# Patient Record
Sex: Female | Born: 1950 | Race: Black or African American | Hispanic: No | Marital: Married | Smoking: Never smoker
Health system: Southern US, Community
[De-identification: ages and names within clinical notes are randomized; demographics above are authoritative.]

## PROBLEM LIST (undated history)

## (undated) DIAGNOSIS — E669 Obesity, unspecified: Secondary | ICD-10-CM

## (undated) DIAGNOSIS — M419 Scoliosis, unspecified: Secondary | ICD-10-CM

## (undated) DIAGNOSIS — T8859XA Other complications of anesthesia, initial encounter: Secondary | ICD-10-CM

## (undated) HISTORY — DX: Scoliosis, unspecified: M41.9

## (undated) HISTORY — DX: Obesity, unspecified: E66.9

## (undated) HISTORY — PX: ABDOMINAL HYSTERECTOMY: SHX81

---

## 1998-09-24 ENCOUNTER — Other Ambulatory Visit: Admission: RE | Admit: 1998-09-24 | Discharge: 1998-09-24 | Payer: Self-pay | Admitting: Gynecology

## 1999-09-27 ENCOUNTER — Other Ambulatory Visit: Admission: RE | Admit: 1999-09-27 | Discharge: 1999-09-27 | Payer: Self-pay | Admitting: Gynecology

## 1999-10-11 ENCOUNTER — Encounter: Admission: RE | Admit: 1999-10-11 | Discharge: 1999-10-11 | Payer: Self-pay | Admitting: Family Medicine

## 1999-10-11 ENCOUNTER — Encounter: Payer: Self-pay | Admitting: Family Medicine

## 2000-10-11 ENCOUNTER — Encounter: Payer: Self-pay | Admitting: Gynecology

## 2000-10-11 ENCOUNTER — Encounter: Admission: RE | Admit: 2000-10-11 | Discharge: 2000-10-11 | Payer: Self-pay | Admitting: Gynecology

## 2000-10-16 ENCOUNTER — Other Ambulatory Visit: Admission: RE | Admit: 2000-10-16 | Discharge: 2000-10-16 | Payer: Self-pay | Admitting: Gynecology

## 2000-10-23 ENCOUNTER — Encounter: Payer: Self-pay | Admitting: Gynecology

## 2000-10-23 ENCOUNTER — Ambulatory Visit (HOSPITAL_COMMUNITY): Admission: RE | Admit: 2000-10-23 | Discharge: 2000-10-23 | Payer: Self-pay | Admitting: Gynecology

## 2001-10-16 ENCOUNTER — Encounter: Payer: Self-pay | Admitting: Family Medicine

## 2001-10-16 ENCOUNTER — Encounter: Admission: RE | Admit: 2001-10-16 | Discharge: 2001-10-16 | Payer: Self-pay | Admitting: Family Medicine

## 2002-01-24 ENCOUNTER — Encounter (INDEPENDENT_AMBULATORY_CARE_PROVIDER_SITE_OTHER): Payer: Self-pay | Admitting: Specialist

## 2002-01-24 ENCOUNTER — Ambulatory Visit (HOSPITAL_COMMUNITY): Admission: RE | Admit: 2002-01-24 | Discharge: 2002-01-24 | Payer: Self-pay | Admitting: Gastroenterology

## 2002-10-17 ENCOUNTER — Encounter: Payer: Self-pay | Admitting: Family Medicine

## 2002-10-17 ENCOUNTER — Encounter: Admission: RE | Admit: 2002-10-17 | Discharge: 2002-10-17 | Payer: Self-pay | Admitting: Family Medicine

## 2002-11-03 ENCOUNTER — Other Ambulatory Visit: Admission: RE | Admit: 2002-11-03 | Discharge: 2002-11-03 | Payer: Self-pay | Admitting: Family Medicine

## 2003-10-29 ENCOUNTER — Ambulatory Visit (HOSPITAL_COMMUNITY): Admission: RE | Admit: 2003-10-29 | Discharge: 2003-10-29 | Payer: Self-pay | Admitting: Family Medicine

## 2004-10-31 ENCOUNTER — Ambulatory Visit (HOSPITAL_COMMUNITY): Admission: RE | Admit: 2004-10-31 | Discharge: 2004-10-31 | Payer: Self-pay | Admitting: Family Medicine

## 2005-11-01 ENCOUNTER — Ambulatory Visit (HOSPITAL_COMMUNITY): Admission: RE | Admit: 2005-11-01 | Discharge: 2005-11-01 | Payer: Self-pay | Admitting: Family Medicine

## 2005-11-27 ENCOUNTER — Other Ambulatory Visit: Admission: RE | Admit: 2005-11-27 | Discharge: 2005-11-27 | Payer: Self-pay | Admitting: Gynecology

## 2006-04-06 ENCOUNTER — Ambulatory Visit (HOSPITAL_COMMUNITY): Admission: RE | Admit: 2006-04-06 | Discharge: 2006-04-06 | Payer: Self-pay | Admitting: Gynecology

## 2006-04-06 ENCOUNTER — Encounter (INDEPENDENT_AMBULATORY_CARE_PROVIDER_SITE_OTHER): Payer: Self-pay | Admitting: *Deleted

## 2006-04-06 HISTORY — PX: PELVIC LAPAROSCOPY: SHX162

## 2006-11-08 ENCOUNTER — Ambulatory Visit (HOSPITAL_COMMUNITY): Admission: RE | Admit: 2006-11-08 | Discharge: 2006-11-08 | Payer: Self-pay | Admitting: Gynecology

## 2006-12-03 ENCOUNTER — Other Ambulatory Visit: Admission: RE | Admit: 2006-12-03 | Discharge: 2006-12-03 | Payer: Self-pay | Admitting: Gynecology

## 2006-12-14 ENCOUNTER — Ambulatory Visit: Payer: Self-pay | Admitting: Oncology

## 2007-01-18 ENCOUNTER — Ambulatory Visit (HOSPITAL_COMMUNITY): Admission: RE | Admit: 2007-01-18 | Discharge: 2007-01-18 | Payer: Self-pay | Admitting: Gastroenterology

## 2007-11-12 ENCOUNTER — Ambulatory Visit (HOSPITAL_COMMUNITY): Admission: RE | Admit: 2007-11-12 | Discharge: 2007-11-12 | Payer: Self-pay | Admitting: Gynecology

## 2007-12-05 ENCOUNTER — Other Ambulatory Visit: Admission: RE | Admit: 2007-12-05 | Discharge: 2007-12-05 | Payer: Self-pay | Admitting: Gynecology

## 2008-11-30 ENCOUNTER — Ambulatory Visit (HOSPITAL_COMMUNITY): Admission: RE | Admit: 2008-11-30 | Discharge: 2008-11-30 | Payer: Self-pay | Admitting: Gynecology

## 2008-12-07 ENCOUNTER — Encounter: Payer: Self-pay | Admitting: Gynecology

## 2008-12-07 ENCOUNTER — Ambulatory Visit: Payer: Self-pay | Admitting: Gynecology

## 2008-12-07 ENCOUNTER — Other Ambulatory Visit: Admission: RE | Admit: 2008-12-07 | Discharge: 2008-12-07 | Payer: Self-pay | Admitting: Gynecology

## 2009-12-03 ENCOUNTER — Ambulatory Visit (HOSPITAL_COMMUNITY): Admission: RE | Admit: 2009-12-03 | Discharge: 2009-12-03 | Payer: Self-pay | Admitting: Gynecology

## 2009-12-10 ENCOUNTER — Other Ambulatory Visit: Admission: RE | Admit: 2009-12-10 | Discharge: 2009-12-10 | Payer: Self-pay | Admitting: Gynecology

## 2009-12-10 ENCOUNTER — Ambulatory Visit: Payer: Self-pay | Admitting: Gynecology

## 2010-12-05 ENCOUNTER — Ambulatory Visit (HOSPITAL_COMMUNITY)
Admission: RE | Admit: 2010-12-05 | Discharge: 2010-12-05 | Payer: Self-pay | Source: Home / Self Care | Attending: Gynecology | Admitting: Gynecology

## 2010-12-13 ENCOUNTER — Other Ambulatory Visit: Payer: Self-pay | Admitting: Gynecology

## 2010-12-13 ENCOUNTER — Other Ambulatory Visit (HOSPITAL_COMMUNITY)
Admission: RE | Admit: 2010-12-13 | Discharge: 2010-12-13 | Disposition: A | Discharge status: Home / Self Care | Payer: BC Managed Care – PPO | Source: Ambulatory Visit | Attending: Gynecology | Admitting: Gynecology

## 2010-12-13 ENCOUNTER — Ambulatory Visit
Admission: RE | Admit: 2010-12-13 | Discharge: 2010-12-13 | Payer: Self-pay | Source: Home / Self Care | Attending: Gynecology | Admitting: Gynecology

## 2010-12-13 DIAGNOSIS — Z124 Encounter for screening for malignant neoplasm of cervix: Secondary | ICD-10-CM | POA: Insufficient documentation

## 2011-01-05 ENCOUNTER — Encounter (INDEPENDENT_AMBULATORY_CARE_PROVIDER_SITE_OTHER): Payer: BC Managed Care – PPO

## 2011-01-05 DIAGNOSIS — Z13820 Encounter for screening for osteoporosis: Secondary | ICD-10-CM

## 2011-01-12 ENCOUNTER — Encounter (INDEPENDENT_AMBULATORY_CARE_PROVIDER_SITE_OTHER): Payer: BC Managed Care – PPO

## 2011-01-12 DIAGNOSIS — E559 Vitamin D deficiency, unspecified: Secondary | ICD-10-CM

## 2011-01-12 DIAGNOSIS — N393 Stress incontinence (female) (male): Secondary | ICD-10-CM

## 2011-01-20 ENCOUNTER — Ambulatory Visit (INDEPENDENT_AMBULATORY_CARE_PROVIDER_SITE_OTHER): Payer: BC Managed Care – PPO | Admitting: Gynecology

## 2011-01-20 DIAGNOSIS — E559 Vitamin D deficiency, unspecified: Secondary | ICD-10-CM

## 2011-01-20 DIAGNOSIS — N393 Stress incontinence (female) (male): Secondary | ICD-10-CM

## 2011-03-31 NOTE — Op Note (Signed)
Lauren Gray, Lauren Gray                ACCOUNT NO.:  [id number]   MEDICAL RECORD NO.:  [id number]          PATIENT TYPE:  AMB   LOCATION:  ENDO                         FACILITY:  [facility]   PHYSICIAN:  Petra Kuba, M.D.    DATE OF BIRTH:  [birth year]   DATE OF PROCEDURE:  01/18/2007  DATE OF DISCHARGE:                               OPERATIVE REPORT   PROCEDURE:  Colonoscopy.   INDICATION:  Family history of colon cancer, due for colonic screening.  Consent was signed after risks, benefits, methods, options thoroughly  discussed in the office on multiple occasions.   MEDICINES USED:  Fentanyl 100, Versed 10.   PROCEDURE IN DETAIL:  Rectal inspection is pertinent for external  hemorrhoids.  Digital exam is negative.  Video colonoscope was inserted  and advanced around the colon to the cecum.  This did require rolling  her on her back and various abdominal pressures.  No abnormalities were  seen on insertion.  The cecum was identified by the appendiceal orifice  and the ileocecal valve.  The prep was adequate.  There was minimal  liquid stools that required washing and suctioning.  On slow withdrawal  through the colon, no abnormalities were seen, specifically no polyps,  hemorrhage or masses or diverticula.  Once back in the rectum, anal  rectal pull through and retroflexion confirmed some small hemorrhoids.  Scope was straightened and readvanced a short ways up the left side of  the colon.  Air was suctioned.  Scope was removed.  The patient  tolerated the procedure well.  There was no obvious immediate  complication.   ENDOSCOPIC DIAGNOSES:  1. Internal and external hemorrhoids.  2. Otherwise within normal limits of the cecum.   PLAN:  Recheck colon screening in 5 years.  Happy to see back p.r.n.,  otherwise, return here to Dr. Lily Peer for the customary healthcare,  screening and maintenance.           ______________________________  Petra Kuba, M.D.     MEM/MEDQ  D:   01/18/2007  T:  01/18/2007  Job:  [id number]   cc:   Gaetano Hawthorne. Lily Peer, M.D.

## 2011-03-31 NOTE — Procedures (Signed)
[facility]  Patient:    Lauren Gray, HETZ Visit Number: [id number] MRN: [id number]          Service Type: END Location: ENDO Attending Physician:  Nelda Marseille Dictated by:   Petra Kuba, M.D. Proc. Date: 01/24/02 Admit Date:  01/24/2002   CC:         Melvyn Neth D. Clovis Riley, MD   Procedure Report  PROCEDURE:  Colonoscopy with biopsy.  INDICATION:  Patient due for colonic screening with some increased left upper quadrant pain and constipation.  Consent was signed after risks, benefits, methods, and options thoroughly discussed in the past.  MEDICATIONS:  Demerol 50, Versed 5.  DESCRIPTION OF PROCEDURE:  Rectal inspection was pertinent for external hemorrhoids.  Digital exam was negative.  The video colonoscope was inserted and easily advanced around the colon to the cecum.  This did not require any abdominal pressure or any position changes.  No obvious abnormality was seen on insertion.  The scope was inserted a short ways in the terminal ileum which was pertinent for just a few tiny erosions, probably prep induced, but a few scattered biopsies were obtained just to make sure.  The scope was then slowly withdrawn.  The prep was adequate.  There was some liquid stool that required washing and suctioning, but on slow withdrawal through the colon, no abnormalities were seen.  Once back in the rectum, the scope was retroflexed, pertinent for some internal hemorrhoids.  The scope was straightened and readvanced a short ways around the left side of the colon; air was suctioned and the scope removed.  The patient tolerated the procedure well.  There was no obvious immediate complication.  ENDOSCOPIC DIAGNOSES: 1. Internal/external hemorrhoids. 2. Rare minimal terminal ileum erosions, probably prep-induced, status post    biopsy. 3. Otherwise within normal limits to the cecum and the terminal ileum.  PLAN:  Happy to see back p.r.n.  Return care to  Dr. Clovis Riley for the customary health care maintenance to include yearly rectals and guaiacs, and otherwise probable repeat screening in 5-10 years. Dictated by:   Petra Kuba, M.D. Attending Physician:  Nelda Marseille DD:  01/24/02 TD:  01/25/02 Job: [id number]

## 2011-03-31 NOTE — Op Note (Signed)
Lauren Gray, Lauren Gray                ACCOUNT NO.:  [id number]   MEDICAL RECORD NO.:  [id number]          PATIENT TYPE:  AMB   LOCATION:  SDC                           FACILITY:  [facility]   PHYSICIAN:  Juan H. Lily Peer, M.D.DATE OF BIRTH:  [birth year]   DATE OF PROCEDURE:  04/06/2006  DATE OF DISCHARGE:                                 OPERATIVE REPORT   SURGEON:  Juan H. Lily Peer, M.D.   ASSISTANT:  Rande Brunt. Eda Paschal, M.D.   INDICATIONS FOR OPERATION:  60 year old gravida 1, para 1 with a strong  family history of ovarian cancer.  Patient with recent ultrasound  demonstrating an avascular solid mass of the right ovary measuring 14 by 19  mm.  The left ovary was normal.  She had a normal CA-125.   PREOPERATIVE DIAGNOSIS:  1.  Pelvic mass.  2.  Family history of ovarian cancer.   POSTOPERATIVE DIAGNOSIS:  1.  Pelvic mass.  2.  Family history of ovarian cancer.   ANESTHESIA:  General endotracheal anesthesia.   PROCEDURE PERFORMED:  Laparoscopic bilateral salpingo-oophorectomy.   FINDINGS:  Patient with evidence of previous hysterectomy, with evidence of  previous laparoscopic tubal ligation Hulka clip was noted.  Both ovaries  appeared to be normal in appearance and no other abnormalities were noted in  the pelvis.   DESCRIPTION OF OPERATION:  After the patient was adequately counseled, she  was taken to the operating room where she underwent successful general  endotracheal anesthesia.  She had received a gram of cefoxitin for  prophylaxis.  She had PSA stockings for DVT prophylaxis.  She underwent  successful general endotracheal anesthesia.  She had a Foley catheter placed  for monitoring urinary output.  The abdomen was prepped and draped in the  usual sterile fashion.  A small subumbilical incision was made under the  umbilicus. With the utilization of the 11 mm OptiVu trocar to assist, an  entrance in the peritoneal cavity was accomplished. The laparoscope was then  inserted.  Two additional port sites were made, 5 mm in the lower abdomen.  Due to omentum and fatty tissue, a third puncture site had to be made  approximately 5 fingerbreadths above the previous right lower abdominal  trocar.  All this was done under laparoscopic guidance.  The patient then  placed into the Trendelenburg position. The right tube and ovary were placed  on traction.  The right ureter was identified.  The right infundibulopelvic  ligament was cauterized and transected.  The ovary was separated from the  peritoneum through repetitive clamping and cauterization, keeping the ureter  away, until finally the right tube and ovary were freed.  A similar  procedure was carried out on the contralateral side. In an effort to remove  both specimens laparoscopically and due to the difficulty with the omentum  and the fatty connective tissue that was making it difficult for  visualization at times, it was decided to enlarge the 5 mm port on the left  lower quadrant to an 11 mm in an effort to allow the Endopouch to be  introduced and retrieval  of both specimens. The pelvic cavity was with good  hemostasis and closure was started after the CO2 was removed.  The fascia of  both 10 mm ports were closed with running stitches of 0 Vicryl suture and  the subcutaneous tissue was reapproximated with 3-0 Vicryl suture.  The 5 mm  ports were only closed with Dermabond glue as was the skin of both 11 mm  port sites.  0.25% Marcaine was infiltrated at all incision ports for  postoperative analgesia.  The patient was extubated and transferred to the  recovery room with stable vital signs.  Blood loss was minimal.  IV fluids  consisted of 2 liters of lactated Ringer's.  Urine output 200 mL.      Juan H. Lily Peer, M.D.  Electronically Signed     JHF/MEDQ  D:  04/06/2006  T:  04/06/2006  Job:  [id number]

## 2011-03-31 NOTE — H&P (Signed)
Lauren Gray, Lauren Gray                ACCOUNT NO.:  [id number]   MEDICAL RECORD NO.:  [id number]          PATIENT TYPE:  AMB   LOCATION:  SDC                           FACILITY:  [facility]   PHYSICIAN:  Juan H. Lily Peer, M.D.DATE OF BIRTH:  September 02, 1951   DATE OF ADMISSION:  DATE OF DISCHARGE:                                HISTORY & PHYSICAL   CHIEF COMPLAINT:  Pelvic mass.   HISTORY OF PRESENT ILLNESS:  The patient is a 60 year old gravida 1, para 1  who is seen in the office for preoperative consultation on March 26, as a  result of evaluation by ultrasound for screening due to the fact that the  patient has a family history of ovarian cancer.  Her mother had endometrioid  cancer of her ovary and a sister with history of breast cancer at the age of  36.  The patient weighs 211 pounds, and because of her history, wanted to  have an ultrasound to better assess her ovaries and CA-125.  The patient has  had history of transvaginal hysterectomy in the past for benign indications.  The ultrasound done in the office on February 19 demonstrated right ovarian  mass, avascular, solid, measuring 14x19 mm.  The left ovary was normal.  She  did have a CA-125 which was normal.  We had a lengthy discussion about this  being possibly either benign cyst or some calcified components.  Since she  is 60 years of age, we offered her at least the diagnostic laparoscopy.  She  had been thinking about it and discussed it with her husband, and decided to  proceed with removal of both ovaries due to her family history.   PAST MEDICAL HISTORY:  1.  She takes Bellergal S for her vasomotor symptoms.  She takes calcium      with vitamin D.  2.  Denies any medical allergies.  3.  Denies smoking or alcohol consumption.  Drinks 2-3 cups of tea per day.   FAMILY HISTORY:  Father and grandmother with diabetes.  Mother with  hypertension.  Father with cardiovascular disease and sister with history of  breast cancer.   Mother with history of endometrioid cancer of her ovary.   PHYSICAL EXAMINATION:  GENERAL APPEARANCE:  Well-developed, well-nourished  female who weighs 223 pounds.  HEENT:  Unremarkable.  NECK:  Supple.  Trachea midline.  No carotid bruits.  No thyromegaly.  LUNGS:  Clear to auscultation without rhonchi or wheezes.  HEART:  Regular rate and rhythm.  No murmurs or gallops.  BREASTS:  Not done.  ABDOMEN:  Soft, nontender without rebound or guarding.  PELVIC:  Bartholin's, urethral and Skene's within normal limits.  VAGINAL:  Cuff intact.  Bimanual exam no palpable mass or tenderness.  RECTAL:  Unremarkable.  Hemoccult negative.   ASSESSMENT:  A 60 year old with family history in mother with history of  ovarian cancer endometrioid type.  Also, sister with breast cancer.  Patient  now with a small ovarian cyst that was noted on ultrasound in February 207.  The patient would like to proceed with bilateral salpingo-oophorectomy.  She  had normal CA-125.  The risks, benefits and pros and cons of the operation  to include infection, although, she will receive prophylaxis antibiotics;  the risk of deep vein thrombosis and subsequent pulmonary embolism and death  were discussed.  She will have PSA stocking for prophylaxis in the event of  uncontrolled hemorrhage.  The patient is fully aware that if she were to  need blood or blood products, it carries the risk of anaphylactic reactions,  hepatitis and AIDS.  Will make an attempt to remove the ovaries  laparoscopically, but in the event of technical difficulties or  complications, an open laparotomy may need to be utilized which may require  the patient to be in the hospital for additional days.  All these issues  were discussed with the patient, and all questions were answered.  The  patient's recent Pap smear in January this year was normal as were her TSH,  fasting blood sugar and lipid profile with the exception of the total  cholesterol  slightly elevated at 203.  Her bone density study done in  February of this year, the AP spinal and T score -1.6 in the osteopenic  range.  All questions were answered and will follow accordingly.  The  patient is scheduled for laparoscopic bilateral salpingo-oophorectomy  Friday, May 25, at [facility].      Higginson. Lily Peer, M.D.  Electronically Signed     JHF/MEDQ  D:  04/04/2006  T:  04/04/2006  Job:  [id number]

## 2011-04-28 ENCOUNTER — Other Ambulatory Visit (INDEPENDENT_AMBULATORY_CARE_PROVIDER_SITE_OTHER): Payer: BC Managed Care – PPO

## 2011-04-28 DIAGNOSIS — E559 Vitamin D deficiency, unspecified: Secondary | ICD-10-CM

## 2011-11-02 ENCOUNTER — Other Ambulatory Visit: Payer: Self-pay | Admitting: Gynecology

## 2011-11-02 DIAGNOSIS — Z1231 Encounter for screening mammogram for malignant neoplasm of breast: Secondary | ICD-10-CM

## 2011-12-11 ENCOUNTER — Ambulatory Visit (HOSPITAL_COMMUNITY)
Admission: RE | Admit: 2011-12-11 | Discharge: 2011-12-11 | Disposition: A | Discharge status: Home / Self Care | Payer: BC Managed Care – PPO | Source: Ambulatory Visit | Attending: Gynecology | Admitting: Gynecology

## 2011-12-11 DIAGNOSIS — Z1231 Encounter for screening mammogram for malignant neoplasm of breast: Secondary | ICD-10-CM | POA: Insufficient documentation

## 2011-12-27 ENCOUNTER — Encounter: Payer: BC Managed Care – PPO | Admitting: Gynecology

## 2012-01-01 ENCOUNTER — Ambulatory Visit (INDEPENDENT_AMBULATORY_CARE_PROVIDER_SITE_OTHER): Payer: BC Managed Care – PPO | Admitting: Gynecology

## 2012-01-01 ENCOUNTER — Other Ambulatory Visit: Payer: Self-pay

## 2012-01-01 ENCOUNTER — Telehealth: Payer: Self-pay | Admitting: *Deleted

## 2012-01-01 ENCOUNTER — Encounter: Payer: Self-pay | Admitting: Gynecology

## 2012-01-01 ENCOUNTER — Telehealth: Payer: Self-pay

## 2012-01-01 DIAGNOSIS — R635 Abnormal weight gain: Secondary | ICD-10-CM

## 2012-01-01 DIAGNOSIS — R7989 Other specified abnormal findings of blood chemistry: Secondary | ICD-10-CM

## 2012-01-01 DIAGNOSIS — Z1211 Encounter for screening for malignant neoplasm of colon: Secondary | ICD-10-CM

## 2012-01-01 DIAGNOSIS — R799 Abnormal finding of blood chemistry, unspecified: Secondary | ICD-10-CM

## 2012-01-01 DIAGNOSIS — R809 Proteinuria, unspecified: Secondary | ICD-10-CM

## 2012-01-01 DIAGNOSIS — Z1272 Encounter for screening for malignant neoplasm of vagina: Secondary | ICD-10-CM

## 2012-01-01 DIAGNOSIS — N951 Menopausal and female climacteric states: Secondary | ICD-10-CM

## 2012-01-01 DIAGNOSIS — R109 Unspecified abdominal pain: Secondary | ICD-10-CM

## 2012-01-01 DIAGNOSIS — Z01419 Encounter for gynecological examination (general) (routine) without abnormal findings: Secondary | ICD-10-CM

## 2012-01-01 DIAGNOSIS — Z0000 Encounter for general adult medical examination without abnormal findings: Secondary | ICD-10-CM

## 2012-01-01 LAB — URINALYSIS W MICROSCOPIC + REFLEX CULTURE
Bilirubin Urine: NEGATIVE
Hgb urine dipstick: NEGATIVE
Ketones, ur: NEGATIVE mg/dL
Leukocytes, UA: NEGATIVE
Specific Gravity, Urine: >1.03 — ABNORMAL HIGH (ref 1.005–1.030)
Urobilinogen, UA: 0.2 mg/dL (ref 0.0–1.0)

## 2012-01-01 LAB — CBC WITH DIFFERENTIAL/PLATELET
Basophils Absolute: 0 10*3/uL (ref 0.0–0.1)
Basophils Relative: 0 % (ref 0–1)
Eosinophils Absolute: 0.1 10*3/uL (ref 0.0–0.7)
HCT: 40 % (ref 36.0–46.0)
Hemoglobin: 12.9 g/dL (ref 12.0–15.0)
MCH: 30.6 pg (ref 26.0–34.0)
MCHC: 32.3 g/dL (ref 30.0–36.0)
MCV: 95 fL (ref 78.0–100.0)
Monocytes Absolute: 0.3 10*3/uL (ref 0.1–1.0)
Platelets: 320 10*3/uL (ref 150–400)
RBC: 4.21 MIL/uL (ref 3.87–5.11)

## 2012-01-01 LAB — COMPREHENSIVE METABOLIC PANEL
ALT: 18 U/L (ref 0–35)
AST: 19 U/L (ref 0–37)
Calcium: 9.2 mg/dL (ref 8.4–10.5)
Chloride: 107 mEq/L (ref 96–112)
Creat: 1.13 mg/dL — ABNORMAL HIGH (ref 0.50–1.10)
Glucose, Bld: 87 mg/dL (ref 70–99)
Potassium: 4.3 mEq/L (ref 3.5–5.3)

## 2012-01-01 LAB — LIPID PANEL
HDL: 65 mg/dL (ref >39)
Total CHOL/HDL Ratio: 3.5 Ratio

## 2012-01-01 NOTE — Telephone Encounter (Signed)
Dr. Glenetta Hew- I had the result note below from you to relay to patient:  Please inform patient that she needs to schedule her bone density study if she hasn't done so already. Her last study was in 2009. Also would like  to repeat her urinalysis was she comes in her bone density study because of some protein in her urine and also her urine was concentrated.  Pt scheduled DEXA but said she felt sure she had one since 2009.  I told her I would pull her hardcopy chart and double check.  Indeed she had one 01/05/11 so it is not yet time for another.  I assume you will just want to return for u/a in the next few weeks??  (I will leave her paper chart with DEXA result in the hall for you.)

## 2012-01-01 NOTE — Telephone Encounter (Signed)
Patient informed appt set at [facility] for 01/05/12 @8am  with 745 arrival. Will make Dexa appt.

## 2012-01-01 NOTE — Patient Instructions (Signed)
Exercise to Lose Weight Exercise and a healthy diet may help you lose weight. Your doctor may suggest specific exercises. EXERCISE IDEAS AND TIPS  Choose low-cost things you enjoy doing, such as walking, bicycling, or exercising to workout videos.   Take stairs instead of the elevator.   Walk during your lunch break.   Park your car further away from work or school.   Go to a gym or an exercise class.   Start with 5 to 10 minutes of exercise each day. Build up to 30 minutes of exercise 4 to 6 days a week.   Wear shoes with good support and comfortable clothes.   Stretch before and after working out.   Work out until you breathe harder and your heart beats faster.   Drink extra water when you exercise.   Do not do so much that you hurt yourself, feel dizzy, or get very short of breath.  Exercises that burn about 150 calories:  Running 1  miles in 15 minutes.   Playing volleyball for 45 to 60 minutes.   Washing and waxing a car for 45 to 60 minutes.   Playing touch football for 45 minutes.   Walking 1  miles in 35 minutes.   Pushing a stroller 1  miles in 30 minutes.   Playing basketball for 30 minutes.   Raking leaves for 30 minutes.   Bicycling 5 miles in 30 minutes.   Walking 2 miles in 30 minutes.   Dancing for 30 minutes.   Shoveling snow for 15 minutes.   Swimming laps for 20 minutes.   Walking up stairs for 15 minutes.   Bicycling 4 miles in 15 minutes.   Gardening for 30 to 45 minutes.   Jumping rope for 15 minutes.   Washing windows or floors for 45 to 60 minutes.  Document Released: 12/02/2010 Document Revised: 07/12/2011 Document Reviewed: 12/02/2010 [employer] Patient Information 2012 [employer].                                                  Cholesterol Control Diet  Cholesterol levels in your body are determined significantly by your diet. Cholesterol levels may also be related to heart disease. The following material helps to  explain this relationship and discusses what you can do to help keep your heart healthy. Not all cholesterol is bad. Low-density lipoprotein (LDL) cholesterol is the "bad" cholesterol. It may cause fatty deposits to build up inside your arteries. High-density lipoprotein (HDL) cholesterol is "good." It helps to remove the "bad" LDL cholesterol from your blood. Cholesterol is a very important risk factor for heart disease. Other risk factors are high blood pressure, smoking, stress, heredity, and weight. The heart muscle gets its supply of blood through the coronary arteries. If your LDL cholesterol is high and your HDL cholesterol is low, you are at risk for having fatty deposits build up in your coronary arteries. This leaves less room through which blood can flow. Without sufficient blood and oxygen, the heart muscle cannot function properly and you may feel chest pains (angina pectoris). When a coronary artery closes up entirely, a part of the heart muscle may die, causing a heart attack (myocardial infarction). CHECKING CHOLESTEROL When your caregiver sends your blood to a lab to be analyzed for cholesterol, a complete lipid (fat) profile may be done. With   this test, the total amount of cholesterol and levels of LDL and HDL are determined. Triglycerides are a type of fat that circulates in the blood and can also be used to determine heart disease risk. The list below describes what the numbers should be: Test: Total Cholesterol.  Less than 200 mg/dl.  Test: LDL "bad cholesterol."  Less than 100 mg/dl.   Less than 70 mg/dl if you are at very high risk of a heart attack or sudden cardiac death.  Test: HDL "good cholesterol."  Greater than 50 mg/dl for women.   Greater than 40 mg/dl for men.  Test: Triglycerides.  Less than 150 mg/dl.  CONTROLLING CHOLESTEROL WITH DIET Although exercise and lifestyle factors are important, your diet is key. That is because certain foods are known to raise  cholesterol and others to lower it. The goal is to balance foods for their effect on cholesterol and more importantly, to replace saturated and trans fat with other types of fat, such as monounsaturated fat, polyunsaturated fat, and omega-3 fatty acids. On average, a person should consume no more than 15 to 17 g of saturated fat daily. Saturated and trans fats are considered "bad" fats, and they will raise LDL cholesterol. Saturated fats are primarily found in animal products such as meats, butter, and cream. However, that does not mean you need to sacrifice all your favorite foods. Today, there are good tasting, low-fat, low-cholesterol substitutes for most of the things you like to eat. Choose low-fat or nonfat alternatives. Choose round or loin cuts of red meat, since these types of cuts are lowest in fat and cholesterol. Chicken (without the skin), fish, veal, and ground turkey breast are excellent choices. Eliminate fatty meats, such as hot dogs and salami. Even shellfish have little or no saturated fat. Have a 3 oz (85 g) portion when you eat lean meat, poultry, or fish. Trans fats are also called "partially hydrogenated oils." They are oils that have been scientifically manipulated so that they are solid at room temperature resulting in a longer shelf life and improved taste and texture of foods in which they are added. Trans fats are found in stick margarine, some tub margarines, cookies, crackers, and baked goods.  When baking and cooking, oils are an excellent substitute for butter. The monounsaturated oils are especially beneficial since it is believed they lower LDL and raise HDL. The oils you should avoid entirely are saturated tropical oils, such as coconut and palm.  Remember to eat liberally from food groups that are naturally free of saturated and trans fat, including fish, fruit, vegetables, beans, grains (barley, rice, couscous, bulgur wheat), and pasta (without cream sauces).  IDENTIFYING  FOODS THAT LOWER CHOLESTEROL  Soluble fiber may lower your cholesterol. This type of fiber is found in fruits such as apples, vegetables such as broccoli, potatoes, and carrots, legumes such as beans, peas, and lentils, and grains such as barley. Foods fortified with plant sterols (phytosterol) may also lower cholesterol. You should eat at least 2 g per day of these foods for a cholesterol lowering effect.  Read package labels to identify low-saturated fats, trans fats free, and low-fat foods at the supermarket. Select cheeses that have only 2 to 3 g saturated fat per ounce. Use a heart-healthy tub margarine that is free of trans fats or partially hydrogenated oil. When buying baked goods (cookies, crackers), avoid partially hydrogenated oils. Breads and muffins should be made from whole grains (whole-wheat or whole oat flour, instead of "flour" or "  enriched flour"). Buy non-creamy canned soups with reduced salt and no added fats.  FOOD PREPARATION TECHNIQUES  Never deep-fry. If you must fry, either stir-fry, which uses very little fat, or use non-stick cooking sprays. When possible, broil, bake, or roast meats, and steam vegetables. Instead of dressing vegetables with butter or margarine, use lemon and herbs, applesauce and cinnamon (for squash and sweet potatoes), nonfat yogurt, salsa, and low-fat dressings for salads.  LOW-SATURATED FAT / LOW-FAT FOOD SUBSTITUTES Meats / Saturated Fat (g)  Avoid: Steak, marbled (3 oz/85 g) / 11 g   Choose: Steak, lean (3 oz/85 g) / 4 g   Avoid: Hamburger (3 oz/85 g) / 7 g   Choose: Hamburger, lean (3 oz/85 g) / 5 g   Avoid: Ham (3 oz/85 g) / 6 g   Choose: Ham, lean cut (3 oz/85 g) / 2.4 g   Avoid: Chicken, with skin, dark meat (3 oz/85 g) / 4 g   Choose: Chicken, skin removed, dark meat (3 oz/85 g) / 2 g   Avoid: Chicken, with skin, light meat (3 oz/85 g) / 2.5 g   Choose: Chicken, skin removed, light meat (3 oz/85 g) / 1 g  Dairy / Saturated Fat  (g)  Avoid: Whole milk (1 cup) / 5 g   Choose: Low-fat milk, 2% (1 cup) / 3 g   Choose: Low-fat milk, 1% (1 cup) / 1.5 g   Choose: Skim milk (1 cup) / 0.3 g   Avoid: Hard cheese (1 oz/28 g) / 6 g   Choose: Skim milk cheese (1 oz/28 g) / 2 to 3 g   Avoid: Cottage cheese, 4% fat (1 cup) / 6.5 g   Choose: Low-fat cottage cheese, 1% fat (1 cup) / 1.5 g   Avoid: Ice cream (1 cup) / 9 g   Choose: Sherbet (1 cup) / 2.5 g   Choose: Nonfat frozen yogurt (1 cup) / 0.3 g   Choose: Frozen fruit bar / trace   Avoid: Whipped cream (1 tbs) / 3.5 g   Choose: Nondairy whipped topping (1 tbs) / 1 g  Condiments / Saturated Fat (g)  Avoid: Mayonnaise (1 tbs) / 2 g   Choose: Low-fat mayonnaise (1 tbs) / 1 g   Avoid: Butter (1 tbs) / 7 g   Choose: Extra light margarine (1 tbs) / 1 g   Avoid: Coconut oil (1 tbs) / 11.8 g   Choose: Olive oil (1 tbs) / 1.8 g   Choose: Corn oil (1 tbs) / 1.7 g   Choose: Safflower oil (1 tbs) / 1.2 g   Choose: Sunflower oil (1 tbs) / 1.4 g   Choose: Soybean oil (1 tbs) / 2.4 g   Choose: Canola oil (1 tbs) / 1 g  Document Released: 10/30/2005 Document Revised: 07/12/2011 Document Reviewed: 04/20/2011 [employer] Patient Information 2012 [employer].   

## 2012-01-01 NOTE — Telephone Encounter (Signed)
Patient will not need a bone density study since she had one in February 2012. We'll need for her to come to the office for a urinalysis some time later in the week first thing in the morning.

## 2012-01-01 NOTE — Telephone Encounter (Signed)
Left message on patient's work voice mail. (no machine at home number/no answer) Patient advised and DEXA  Cancelled. Order put in for u/a and I advised patient to call to schedule lab appt when she knows what day she will return for u/s.

## 2012-01-01 NOTE — Progress Notes (Signed)
Lauren Gray 06-27-1951 [id number]   History:    60 y.o.  for annual exam with continued complaints of midepigastric but more left upper quadrant discomfort which comes and goes at different times but she does states is more common after eating fried foods. Review of her record indicated that her colonoscopy was in 2008 she has a followup later this year. Mammogram normal in 2000 and and 13. Patient does monthly self breast examination. Last bone density study was done in 2009 lowest T score at the [facility] -1.1. Patient with prior history of TVH BSO currently on no hormone replacement therapy and no vasomotor symptoms complaints. Patient sister died of breast cancer the age of 40.  Past medical history,surgical history, family history and social history were all reviewed and documented in the EPIC chart.  Gynecologic History No LMP recorded. Patient has had a hysterectomy. Contraception: none Last Pap: 2012. Results were: normal Last mammogram: 2013. Results were: normal  Obstetric History OB History    Grav Para Term Preterm Abortions TAB SAB Ect Mult Living   1 1 1       1      # Outc Date GA Lbr Len/2nd Wgt Sex Del Anes PTL Lv   1 TRM     F SVD  No Yes       ROS:  Was performed and pertinent positives and negatives are included in the history.  Exam: chaperone present  BP 132/88  Ht 5' 8.88" (1.75 m)  Wt 245 lb (111.131 kg)  BMI 36.31 kg/m2  Body mass index is 36.31 kg/(m^2).  General appearance : Well developed well nourished female. No acute distress HEENT: Neck supple, trachea midline, no carotid bruits, no thyroidmegaly Lungs: Clear to auscultation, no rhonchi or wheezes, or rib retractions  Heart: Regular rate and rhythm, no murmurs or gallops Breast:Examined in sitting and supine position were symmetrical in appearance, no palpable masses or tenderness,  no skin retraction, no nipple inversion, no nipple discharge, no skin discoloration, no axillary or supraclavicular  lymphadenopathy Abdomen: no palpable masses or tenderness, no rebound or guarding Extremities: no edema or skin discoloration or tenderness  Pelvic:  Bartholin, Urethra, Skene Glands: Within normal limits             Vagina: No gross lesions or discharge  Cervix: Absent  Uterus  absent  Adnexa  Without masses or tenderness  Anus and perineum  normal   Rectovaginal  normal sphincter tone without palpated masses or tenderness             Hemoccult obtained pending at time of this dictation     Assessment/Plan:  61 y.o. female for annual exam no Pap smear done today do screening guidelines discussed. Patient with persistence for quite some time of upper abdominal discomfort especially left upper quadrant and now describe today even after fatty meals. We'll plan on scheduling upper abdominal ultrasound and have her followup with the gas urologist a week after. We'll obtain the following lab work and she is fasting: Fasting lipid profile, comprehensive metabolic panel, TSH, CBC, and urinalysis. She was encouraged to continue monthly self breast examination. Fecal occult blood testing pending at time of this dictation.    Ok Edwards MD, 10:47 AM 01/01/2012

## 2012-01-01 NOTE — Telephone Encounter (Signed)
Message copied by Libby Maw on Mon Jan 01, 2012  3:56 PM ------      Message from: Ok Edwards      Created: Mon Jan 01, 2012 10:53 AM       Preslie Depasquale, please schedule upper abdominal ultrasound on this patient with midepigastric pains. She had left the office before we were able to tell her to stop by the front desk to schedule her bone density study if you would do this as well.. Patient will be making her own appointment with her gastroenterologist a week after the ultrasound date . Thank you

## 2012-01-02 ENCOUNTER — Encounter: Payer: Self-pay | Admitting: Gynecology

## 2012-01-02 NOTE — Progress Notes (Signed)
Addended by: Venora Maples on: 01/02/2012 03:45 PM   Modules accepted: Orders

## 2012-01-04 ENCOUNTER — Other Ambulatory Visit: Payer: Self-pay | Admitting: Gynecology

## 2012-01-04 DIAGNOSIS — Z1211 Encounter for screening for malignant neoplasm of colon: Secondary | ICD-10-CM

## 2012-01-05 ENCOUNTER — Ambulatory Visit (HOSPITAL_COMMUNITY)
Admission: RE | Admit: 2012-01-05 | Discharge: 2012-01-05 | Disposition: A | Discharge status: Home / Self Care | Payer: BC Managed Care – PPO | Source: Ambulatory Visit | Attending: Gynecology | Admitting: Gynecology

## 2012-01-05 DIAGNOSIS — Z90710 Acquired absence of both cervix and uterus: Secondary | ICD-10-CM | POA: Insufficient documentation

## 2012-01-05 DIAGNOSIS — R1013 Epigastric pain: Secondary | ICD-10-CM | POA: Insufficient documentation

## 2012-01-11 ENCOUNTER — Other Ambulatory Visit: Payer: BC Managed Care – PPO

## 2012-01-11 ENCOUNTER — Telehealth: Payer: Self-pay | Admitting: *Deleted

## 2012-01-11 DIAGNOSIS — R809 Proteinuria, unspecified: Secondary | ICD-10-CM

## 2012-01-11 NOTE — Telephone Encounter (Signed)
Pt will follow up with her GI doctor.

## 2012-01-11 NOTE — Telephone Encounter (Signed)
Pt informed with the below note. 

## 2012-01-11 NOTE — Telephone Encounter (Signed)
Left message for pt to call regarding the below note. 

## 2012-01-11 NOTE — Telephone Encounter (Signed)
Message copied by Aura Camps on Thu Jan 11, 2012 11:05 AM ------      Message from: Ok Edwards      Created: Thu Jan 11, 2012 11:02 AM       Lauren Gray, please inform patient that her abdominal ultrasound was normal. But because of her midepigastric pains I had recommended she followup with her gastroenterologist. If she has not established with her gastroenterologist at given her name of Dr. Elnoria Howard or Dr. Loreta Ave. Thank you

## 2012-01-12 LAB — URINALYSIS W MICROSCOPIC + REFLEX CULTURE
Bilirubin Urine: NEGATIVE
Hgb urine dipstick: NEGATIVE
Ketones, ur: NEGATIVE mg/dL
Leukocytes, UA: NEGATIVE
Nitrite: NEGATIVE
Protein, ur: NEGATIVE mg/dL
pH: 6 (ref 5.0–8.0)

## 2012-01-17 ENCOUNTER — Telehealth: Payer: Self-pay | Admitting: *Deleted

## 2012-01-17 NOTE — Telephone Encounter (Signed)
Pt called requesting recent urine culture result. Results given to pt.

## 2012-02-28 ENCOUNTER — Encounter: Payer: Self-pay | Admitting: Gynecology

## 2012-10-28 ENCOUNTER — Other Ambulatory Visit: Payer: Self-pay | Admitting: Otolaryngology

## 2012-10-28 DIAGNOSIS — H5789 Other specified disorders of eye and adnexa: Secondary | ICD-10-CM

## 2012-10-28 DIAGNOSIS — J329 Chronic sinusitis, unspecified: Secondary | ICD-10-CM

## 2012-10-31 ENCOUNTER — Ambulatory Visit
Admission: RE | Admit: 2012-10-31 | Discharge: 2012-10-31 | Disposition: A | Discharge status: Home / Self Care | Payer: BC Managed Care – PPO | Source: Ambulatory Visit | Attending: Otolaryngology | Admitting: Otolaryngology

## 2012-10-31 DIAGNOSIS — H5789 Other specified disorders of eye and adnexa: Secondary | ICD-10-CM

## 2012-10-31 DIAGNOSIS — J329 Chronic sinusitis, unspecified: Secondary | ICD-10-CM

## 2012-11-18 ENCOUNTER — Other Ambulatory Visit: Payer: Self-pay | Admitting: Gynecology

## 2012-11-18 DIAGNOSIS — Z1231 Encounter for screening mammogram for malignant neoplasm of breast: Secondary | ICD-10-CM

## 2012-12-30 ENCOUNTER — Ambulatory Visit (HOSPITAL_COMMUNITY)
Admission: RE | Admit: 2012-12-30 | Discharge: 2012-12-30 | Disposition: A | Discharge status: Home / Self Care | Payer: BC Managed Care – PPO | Source: Ambulatory Visit | Attending: Gynecology | Admitting: Gynecology

## 2012-12-30 DIAGNOSIS — Z1231 Encounter for screening mammogram for malignant neoplasm of breast: Secondary | ICD-10-CM | POA: Insufficient documentation

## 2013-01-31 ENCOUNTER — Encounter: Payer: BC Managed Care – PPO | Admitting: Gynecology

## 2013-02-03 ENCOUNTER — Encounter: Payer: BC Managed Care – PPO | Admitting: Gynecology

## 2013-02-17 ENCOUNTER — Encounter: Payer: Self-pay | Admitting: Gynecology

## 2013-02-17 ENCOUNTER — Ambulatory Visit (INDEPENDENT_AMBULATORY_CARE_PROVIDER_SITE_OTHER): Payer: BC Managed Care – PPO | Admitting: Gynecology

## 2013-02-17 VITALS — BP 130/86 | Ht 67.0 in | Wt 246.0 lb

## 2013-02-17 DIAGNOSIS — Z803 Family history of malignant neoplasm of breast: Secondary | ICD-10-CM | POA: Insufficient documentation

## 2013-02-17 DIAGNOSIS — Z01419 Encounter for gynecological examination (general) (routine) without abnormal findings: Secondary | ICD-10-CM

## 2013-02-17 DIAGNOSIS — R635 Abnormal weight gain: Secondary | ICD-10-CM

## 2013-02-17 DIAGNOSIS — Z780 Asymptomatic menopausal state: Secondary | ICD-10-CM

## 2013-02-17 DIAGNOSIS — Z8041 Family history of malignant neoplasm of ovary: Secondary | ICD-10-CM

## 2013-02-17 DIAGNOSIS — Z833 Family history of diabetes mellitus: Secondary | ICD-10-CM

## 2013-02-17 DIAGNOSIS — Z1159 Encounter for screening for other viral diseases: Secondary | ICD-10-CM

## 2013-02-17 DIAGNOSIS — Z8639 Personal history of other endocrine, nutritional and metabolic disease: Secondary | ICD-10-CM

## 2013-02-17 LAB — CBC WITH DIFFERENTIAL/PLATELET
Basophils Absolute: 0 10*3/uL (ref 0.0–0.1)
Lymphocytes Relative: 43 % (ref 12–46)
Lymphs Abs: 1.8 10*3/uL (ref 0.7–4.0)
Neutro Abs: 2 10*3/uL (ref 1.7–7.7)
Platelets: 308 10*3/uL (ref 150–400)
RBC: 4.21 MIL/uL (ref 3.87–5.11)
RDW: 13.6 % (ref 11.5–15.5)
WBC: 4.2 10*3/uL (ref 4.0–10.5)

## 2013-02-17 LAB — COMPREHENSIVE METABOLIC PANEL
ALT: 24 U/L (ref 0–35)
BUN: 13 mg/dL (ref 6–23)
CO2: 28 mEq/L (ref 19–32)
Chloride: 104 mEq/L (ref 96–112)
Creat: 1.07 mg/dL (ref 0.50–1.10)
Glucose, Bld: 104 mg/dL — ABNORMAL HIGH (ref 70–99)

## 2013-02-17 LAB — LIPID PANEL
HDL: 59 mg/dL (ref >39)
LDL Cholesterol: 135 mg/dL — ABNORMAL HIGH (ref 0–99)
Total CHOL/HDL Ratio: 3.6 Ratio
Triglycerides: 84 mg/dL (ref <150)
VLDL: 17 mg/dL (ref 0–40)

## 2013-02-17 LAB — TSH: TSH: 1.135 u[IU]/mL (ref 0.350–4.500)

## 2013-02-17 NOTE — Patient Instructions (Addendum)
BRCA-1 and BRCA-2 BRCA-1 and BRCA-2 are 2 genes that are linked with hereditary breast and ovarian cancers. About 200,000 women are diagnosed with invasive breast cancer each year and about 23,000 with ovarian cancer (according to the American Cancer Society). Of these cancers, about 5% to 10% will be due to a mutation in one of the BRCA genes. Men can also inherit an increased risk of developing breast cancer, primarily from an alteration in the BRCA-2 gene.  Individuals with mutations in BRCA1 or BRCA2 have significantly elevated risks for breast cancer (up to 80% lifetime risk), ovarian cancer (up to 40% lifetime risk), bilateral breast cancer and other types of cancers. BRCA mutations are inherited and passed from generation to generation. One half of the time, they are passed from the father's side of the family.  The DNA in white blood cells is used to detect mutations in the BRCA genes. While the gene products (proteins) of the BRCA genes act only in breast and ovarian tissue, the genes are present in every cell of the body and blood is the most easily accessible source of that DNA. PREPARATION FOR TEST The test for BRCA mutations is done on a blood sample collected by needle from a vein in the arm. The test does not require surgical biopsy of breast or ovarian tissue.  NORMAL FINDINGS No genetic mutations. Ranges for normal findings may vary among different laboratories and hospitals. You should always check with your doctor after having lab work or other tests done to discuss the meaning of your test results and whether your values are considered within normal limits. MEANING OF TEST  Your caregiver will go over the test results with you and discuss the importance and meaning of your results, as well as treatment options and the need for additional tests if necessary. OBTAINING THE TEST RESULTS It is your responsibility to obtain your test results. Ask the lab or department performing the test  when and how you will get your results. OTHER THINGS TO KNOW Your test results may have implications for other family members. When one member of a family is tested for BRCA mutations, issues often arise about how or whether to share this information with other family members. Seek advice from a genetic counselor about communication of result with your family members.  Pre and post test consultation with a health care provider knowledgeable about genetic testing cannot be overemphasized.  There are many issues to be considered when preparing for a genetic test and upon learning the results, and a genetic counselor has the knowledge and experience to help you sort through them.  If the BRCA test is positive, the options include increased frequency of check-ups (e.g., mammography, blood tests for CA-125, or transvaginal ultrasonography); medications that could reduce risk (e.g., oral contraceptives or tamoxifen); or surgical removal of the ovaries or breasts. There are a number of variables involved and it is important to discuss your options with your doctor and genetic counselor. Research studies have reported that for every 1000 women negative for BRCA mutations, between 12 and 45 of them will develop breast cancer by age 50 and between 3 and 4 will develop ovarian cancer by age 50. The risk increases with age. The test can be ordered by a doctor, preferably by one who can also offer genetic counseling. The blood sample will be sent to a laboratory that specializes in BRCA testing. The American Society of Clinical Oncology and the National Breast Cancer Coalition encourage women seeking the   test to participate in long-term outcome studies to help gather information on the effectiveness of different check-up and treatment options. Document Released: 11/23/2004 Document Revised: 01/22/2012 Document Reviewed: 10/05/2008 [facility] Patient Information 2013 [place], [place].                                             Patient information: High cholesterol (The Basics)  What is cholesterol? - Cholesterol is a substance that is found in the blood. Everyone has some. It is needed for good health. The problem is, people sometimes have too much cholesterol. Compared with people with normal cholesterol, people with high cholesterol have a higher risk of heart attacks, strokes, and other health problems. The higher your cholesterol, the higher your risk of these problems. Cholesterol levels in your body are determined significantly by your diet. Cholesterol levels may also be related to heart disease. The following material helps to explain this relationship and discusses what you can do to help keep your heart healthy. Not all cholesterol is bad. Low-density lipoprotein (LDL) cholesterol is the "bad" cholesterol. It may cause fatty deposits to build up inside your arteries. High-density lipoprotein (HDL) cholesterol is "good." It helps to remove the "bad" LDL cholesterol from your blood. Cholesterol is a very important risk factor for heart disease. Other risk factors are high blood pressure, smoking, stress, heredity, and weight.  The heart muscle gets its supply of blood through the coronary arteries. If your LDL cholesterol is high and your HDL cholesterol is low, you are at risk for having fatty deposits build up in your coronary arteries. This leaves less room through which blood can flow. Without sufficient blood and oxygen, the heart muscle cannot function properly and you may feel chest pains (angina pectoris). When a coronary artery closes up entirely, a part of the heart muscle may die, causing a heart attack (myocardial infarction).  CHECKING CHOLESTEROL When your caregiver sends your blood to a lab to be analyzed for cholesterol, a complete lipid (fat) profile may be done. With this test, the total amount of cholesterol and levels of LDL and HDL are determined. Triglycerides are a type of fat that circulates in  the blood and can also be used to determine heart disease risk. Are there different types of cholesterol? - Yes, there are a few different types. If you get a cholesterol test, you may hear your doctor or nurse talk about: Total cholesterol  LDL cholesterol - Some people call this the "bad" cholesterol. That's because having high LDL levels raises your risk of heart attacks, strokes, and other health problems.  HDL cholesterol - Some people call this the "good" cholesterol. That's because having high HDL levels lowers your risk of heart attacks, strokes, and other health problems.  Non-HDL cholesterol - Non-HDL cholesterol is your total cholesterol minus your HDL cholesterol.  Triglycerides - Triglycerides are not cholesterol. They are a type of fat. But they often get measured when cholesterol is measured. (Having high triglycerides also seems to increase the risk of heart attacks and strokes.)   Keep in mind, though, that many people who cannot meet these goals still have a low risk of heart attacks and strokes. What should I do if my doctor tells me I have high cholesterol? - Ask your doctor what your overall risk of heart attacks and strokes is. High cholesterol, by itself, is not always  a reason to worry. Having high cholesterol is just one of many things that can increase your risk of heart attacks and strokes. Other factors that increase your risk include:  Cigarette smoking  High blood pressure  Having a parent, sister, or brother who got heart disease at a young age (Young, in this case, means younger than 12 for men and younger than 13 for women.)  Being a man (Women are at risk, too, but men have a higher risk.)  Older age  If you are at high risk of heart attacks and strokes, having high cholesterol is a problem. On the other hand, if you have are at low risk, having high cholesterol may not mean much. Should I take medicine to lower cholesterol? - Not everyone who has high cholesterol  needs medicines. Your doctor or nurse will decide if you need them based on your age, family history, and other health concerns.  You should probably take a cholesterol-lowering medicine called a statin if you: Already had a heart attack or stroke  Have known heart disease  Have diabetes  Have a condition called peripheral artery disease, which makes it painful to walk, and happens when the arteries in your legs get clogged with fatty deposits  Have an abdominal aortic aneurysm, which is a widening of the main artery in the belly  Most people with any of the conditions listed above should take a statin no matter what their cholesterol level is. If your doctor or nurse puts you on a statin, stay on it. The medicine may not make you feel any different. But it can help prevent heart attacks, strokes, and death.  Can I lower my cholesterol without medicines? - Yes, you can lower your cholesterol some by:  Avoiding red meat, butter, fried foods, cheese, and other foods that have a lot of saturated fat  Losing weight (if you are overweight)  Being more active Even if these steps do little to change your cholesterol, they can improve your health in many ways.                                                   Cholesterol Control Diet  CONTROLLING CHOLESTEROL WITH DIET Although exercise and lifestyle factors are important, your diet is key. That is because certain foods are known to raise cholesterol and others to lower it. The goal is to balance foods for their effect on cholesterol and more importantly, to replace saturated and trans fat with other types of fat, such as monounsaturated fat, polyunsaturated fat, and omega-3 fatty acids. On average, a person should consume no more than 15 to 17 g of saturated fat daily. Saturated and trans fats are considered "bad" fats, and they will raise LDL cholesterol. Saturated fats are primarily found in animal products such as meats, butter, and cream. However, that  does not mean you need to sacrifice all your favorite foods. Today, there are good tasting, low-fat, low-cholesterol substitutes for most of the things you like to eat. Choose low-fat or nonfat alternatives. Choose round or loin cuts of red meat, since these types of cuts are lowest in fat and cholesterol. Chicken (without the skin), fish, veal, and ground [place] breast are excellent choices. Eliminate fatty meats, such as hot dogs and salami. Even shellfish have little or no saturated fat. Have a 3  oz (85 g) portion when you eat lean meat, poultry, or fish. Trans fats are also called "partially hydrogenated oils." They are oils that have been scientifically manipulated so that they are solid at room temperature resulting in a longer shelf life and improved taste and texture of foods in which they are added. Trans fats are found in stick margarine, some tub margarines, cookies, crackers, and baked goods.  When baking and cooking, oils are an excellent substitute for butter. The monounsaturated oils are especially beneficial since it is believed they lower LDL and raise HDL. The oils you should avoid entirely are saturated tropical oils, such as coconut and palm.  Remember to eat liberally from food groups that are naturally free of saturated and trans fat, including fish, fruit, vegetables, beans, grains (barley, rice, couscous, bulgur wheat), and pasta (without cream sauces).  IDENTIFYING FOODS THAT LOWER CHOLESTEROL  Soluble fiber may lower your cholesterol. This type of fiber is found in fruits such as apples, vegetables such as broccoli, potatoes, and carrots, legumes such as beans, peas, and lentils, and grains such as barley. Foods fortified with plant sterols (phytosterol) may also lower cholesterol. You should eat at least 2 g per day of these foods for a cholesterol lowering effect.  Read package labels to identify low-saturated fats, trans fats free, and low-fat foods at the supermarket. Select  cheeses that have only 2 to 3 g saturated fat per ounce. Use a heart-healthy tub margarine that is free of trans fats or partially hydrogenated oil. When buying baked goods (cookies, crackers), avoid partially hydrogenated oils. Breads and muffins should be made from whole grains (whole-wheat or whole oat flour, instead of "flour" or "enriched flour"). Buy non-creamy canned soups with reduced salt and no added fats.  FOOD PREPARATION TECHNIQUES  Never deep-fry. If you must fry, either stir-fry, which uses very little fat, or use non-stick cooking sprays. When possible, broil, bake, or roast meats, and steam vegetables. Instead of dressing vegetables with butter or margarine, use lemon and herbs, applesauce and cinnamon (for squash and sweet potatoes), nonfat yogurt, salsa, and low-fat dressings for salads.  LOW-SATURATED FAT / LOW-FAT FOOD SUBSTITUTES Meats / Saturated Fat (g)  Avoid: Steak, marbled (3 oz/85 g) / 11 g   Choose: Steak, lean (3 oz/85 g) / 4 g   Avoid: Hamburger (3 oz/85 g) / 7 g   Choose: Hamburger, lean (3 oz/85 g) / 5 g   Avoid: Ham (3 oz/85 g) / 6 g   Choose: Ham, lean cut (3 oz/85 g) / 2.4 g   Avoid: Chicken, with skin, dark meat (3 oz/85 g) / 4 g   Choose: Chicken, skin removed, dark meat (3 oz/85 g) / 2 g   Avoid: Chicken, with skin, light meat (3 oz/85 g) / 2.5 g   Choose: Chicken, skin removed, light meat (3 oz/85 g) / 1 g  Dairy / Saturated Fat (g)  Avoid: Whole milk (1 cup) / 5 g   Choose: Low-fat milk, 2% (1 cup) / 3 g   Choose: Low-fat milk, 1% (1 cup) / 1.5 g   Choose: Skim milk (1 cup) / 0.3 g   Avoid: Hard cheese (1 oz/28 g) / 6 g   Choose: Skim milk cheese (1 oz/28 g) / 2 to 3 g   Avoid: Cottage cheese, 4% fat (1 cup) / 6.5 g   Choose: Low-fat cottage cheese, 1% fat (1 cup) / 1.5 g   Avoid: Ice cream (1 cup) /  9 g   Choose: Sherbet (1 cup) / 2.5 g   Choose: Nonfat frozen yogurt (1 cup) / 0.3 g   Choose: Frozen fruit bar / trace    Avoid: Whipped cream (1 tbs) / 3.5 g   Choose: Nondairy whipped topping (1 tbs) / 1 g  Condiments / Saturated Fat (g)  Avoid: Mayonnaise (1 tbs) / 2 g   Choose: Low-fat mayonnaise (1 tbs) / 1 g   Avoid: Butter (1 tbs) / 7 g   Choose: Extra light margarine (1 tbs) / 1 g   Avoid: Coconut oil (1 tbs) / 11.8 g   Choose: Olive oil (1 tbs) / 1.8 g   Choose: Corn oil (1 tbs) / 1.7 g   Choose: Safflower oil (1 tbs) / 1.2 g   Choose: Sunflower oil (1 tbs) / 1.4 g   Choose: Soybean oil (1 tbs) / 2.4 g   Choose: Canola oil (1 tbs) / 1 g  Exercise to Lose Weight Exercise and a healthy diet may help you lose weight. Your doctor may suggest specific exercises. EXERCISE IDEAS AND TIPS Choose low-cost things you enjoy doing, such as walking, bicycling, or exercising to workout videos.  Take stairs instead of the elevator.  Walk during your lunch break.  Park your car further away from work or school.  Go to a gym or an exercise class.  Start with 5 to 10 minutes of exercise each day. Build up to 30 minutes of exercise 4 to 6 days a week.  Wear shoes with good support and comfortable clothes.  Stretch before and after working out.  Work out until you breathe harder and your heart beats faster.  Drink extra water when you exercise.  Do not do so much that you hurt yourself, feel dizzy, or get very short of breath.  Exercises that burn about 150 calories: Running 1  miles in 15 minutes.  Playing volleyball for 45 to 60 minutes.  Washing and waxing a car for 45 to 60 minutes.  Playing touch football for 45 minutes.  Walking 1  miles in 35 minutes.  Pushing a stroller 1  miles in 30 minutes.  Playing basketball for 30 minutes.  Raking leaves for 30 minutes.  Bicycling 5 miles in 30 minutes.  Walking 2 miles in 30 minutes.  Dancing for 30 minutes.  Shoveling snow for 15 minutes.  Swimming laps for 20 minutes.  Walking up stairs for 15 minutes.  Bicycling 4 miles in 15 minutes.   Gardening for 30 to 45 minutes.  Jumping rope for 15 minutes.  Washing windows or floors for 45 to 60 minutes.  Document Released: 12/02/2010 Document Revised: 07/12/2011 Document Reviewed: 12/02/2010 [employer] Patient Information 2012 [place], [place].

## 2013-02-17 NOTE — Progress Notes (Addendum)
Lauren Gray 15-Oct-1951 [id number]   History:    62 y.o.  for annual gyn exam was only complaint today was weight gain. Patient with prior history of TVH/BSO. Patient on no hormone replacement therapy. Patient's last bone density study was in 2012 which was normal. Patient's last colonoscopy was negative April of 2013. Patient with strong family history of cancer as follows:  Mother with history of endometrioid cancer of the ovary Sister breast cancer age 3 Grandmother and uncle with colon cancer Father with history of left breast lump?  Father insulin-dependent diabetic  Patient's last mammogram was normal February this year. Patient frequently does her self breast examination. 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 Patient would know prior history of abnormal Pap smear.  Past medical history,surgical history, family history and social history were all reviewed and documented in the EPIC chart.  Gynecologic History No LMP recorded. Patient has had a hysterectomy. Contraception: status post hysterectomy Last Pap: 2012. Results were: normal Last mammogram: 2014. Results were: normal  Obstetric History OB History   Grav Para Term Preterm Abortions TAB SAB Ect Mult Living   1 1 1       1      # Outc Date GA Lbr Len/2nd Wgt Sex Del Anes PTL Lv   1 TRM     F SVD  No Yes       ROS: A ROS was performed and pertinent positives and negatives are included in the history.  GENERAL: No fevers or chills. HEENT: No change in vision, no earache, sore throat or sinus congestion. NECK: No pain or stiffness. CARDIOVASCULAR: No chest pain or pressure. No palpitations. PULMONARY: No shortness of breath, cough or wheeze. GASTROINTESTINAL: No abdominal pain, nausea, vomiting or diarrhea, melena or bright red blood per rectum. GENITOURINARY: No urinary frequency, urgency, hesitancy or dysuria. MUSCULOSKELETAL: No joint or muscle pain, no back pain, no recent  trauma. DERMATOLOGIC: No rash, no itching, no lesions. ENDOCRINE: No polyuria, polydipsia, no heat or cold intolerance. No recent change in weight. HEMATOLOGICAL: No anemia or easy bruising or bleeding. NEUROLOGIC: No headache, seizures, numbness, tingling or weakness. PSYCHIATRIC: No depression, no loss of interest in normal activity or change in sleep pattern.     Exam: chaperone present  BP 130/86  Ht 5\' 7"  (1.702 m)  Wt 246 lb (111.585 kg)  BMI 38.52 kg/m2  Body mass index is 38.52 kg/(m^2).  General appearance : Well developed well nourished female. No acute distress HEENT: Neck supple, trachea midline, no carotid bruits, no thyroidmegaly Lungs: Clear to auscultation, no rhonchi or wheezes, or rib retractions  Heart: Regular rate and rhythm, no murmurs or gallops Breast:Examined in sitting and supine position were symmetrical in appearance, no palpable masses or tenderness,  no skin retraction, no nipple inversion, no nipple discharge, no skin discoloration, no axillary or supraclavicular lymphadenopathy Abdomen: no palpable masses or tenderness, no rebound or guarding Extremities: no edema or skin discoloration or tenderness  Pelvic:  Bartholin, Urethra, Skene Glands: Within normal limits             Vagina: No gross lesions or discharge  Cervix: absent  Uterus Absent  Adnexa  Without masses or tenderness  Anus and perineum  normal   Rectovaginal  normal sphincter tone without palpated masses or tenderness             Hemoccult card provided     Assessment/Plan:  62 y.o. female  for annual exam who was given literature information on diet and exercise. She will need to schedule her bone density study which is overdue. We discussed importance of regular exercise as well as calcium and vitamin D intake for osteoporosis prevention. She will no longer needs Pap smears since she had a hysterectomy and has had no prior history of abnormal Pap smears according to the new guidelines.  The following labs were ordered: Fasting lipid profile, conference metabolic panel, vitamin D, CBC, urinalysis, vitamin D, TSH and hepatitis C:  New CDC guidelines is recommending patients be tested once in her lifetime for hepatitis C antibody who were born between [birth year] through 1965. This was discussed with the patient today and has agreed to be tested today.  I have offered patient to undergo BRCA one BRCA2 testing and to be referred [facility] and patient would like to rebound the information that I provided and decided a later date (she is hesitant)    Ok Edwards MD, 9:47 AM 02/17/2013

## 2013-02-18 ENCOUNTER — Other Ambulatory Visit: Payer: Self-pay | Admitting: Gynecology

## 2013-02-18 DIAGNOSIS — E7800 Pure hypercholesterolemia, unspecified: Secondary | ICD-10-CM

## 2013-02-18 DIAGNOSIS — E559 Vitamin D deficiency, unspecified: Secondary | ICD-10-CM

## 2013-02-18 LAB — URINALYSIS W MICROSCOPIC + REFLEX CULTURE
Hgb urine dipstick: NEGATIVE
Leukocytes, UA: NEGATIVE
Nitrite: NEGATIVE
Protein, ur: NEGATIVE mg/dL
Urobilinogen, UA: 0.2 mg/dL (ref 0.0–1.0)

## 2013-02-18 LAB — VITAMIN D 25 HYDROXY (VIT D DEFICIENCY, FRACTURES): Vit D, 25-Hydroxy: 19 ng/mL — ABNORMAL LOW (ref 30–89)

## 2013-02-18 LAB — HEPATITIS C ANTIBODY: HCV Ab: NEGATIVE

## 2013-02-18 MED ORDER — ERGOCALCIFEROL 1.25 MG (50000 UT) PO CAPS: 50000.0000 [IU] | ORAL_CAPSULE | ORAL | Status: DC

## 2013-04-01 ENCOUNTER — Ambulatory Visit (INDEPENDENT_AMBULATORY_CARE_PROVIDER_SITE_OTHER): Payer: BC Managed Care – PPO

## 2013-04-01 DIAGNOSIS — Z13820 Encounter for screening for osteoporosis: Secondary | ICD-10-CM

## 2013-04-01 DIAGNOSIS — Z780 Asymptomatic menopausal state: Secondary | ICD-10-CM

## 2013-11-26 ENCOUNTER — Other Ambulatory Visit: Payer: Self-pay | Admitting: Gynecology

## 2013-11-26 DIAGNOSIS — Z1231 Encounter for screening mammogram for malignant neoplasm of breast: Secondary | ICD-10-CM

## 2014-01-01 ENCOUNTER — Ambulatory Visit (HOSPITAL_COMMUNITY)
Admission: RE | Admit: 2014-01-01 | Discharge: 2014-01-01 | Disposition: A | Discharge status: Home / Self Care | Payer: BC Managed Care – PPO | Source: Ambulatory Visit | Attending: Gynecology | Admitting: Gynecology

## 2014-01-01 DIAGNOSIS — Z1231 Encounter for screening mammogram for malignant neoplasm of breast: Secondary | ICD-10-CM

## 2014-03-17 ENCOUNTER — Encounter: Payer: BC Managed Care – PPO | Admitting: Gynecology

## 2014-03-26 ENCOUNTER — Ambulatory Visit (INDEPENDENT_AMBULATORY_CARE_PROVIDER_SITE_OTHER): Payer: BC Managed Care – PPO | Admitting: Gynecology

## 2014-03-26 ENCOUNTER — Encounter: Payer: Self-pay | Admitting: Gynecology

## 2014-03-26 ENCOUNTER — Telehealth: Payer: Self-pay | Admitting: *Deleted

## 2014-03-26 VITALS — BP 132/88 | Ht 67.0 in | Wt 252.0 lb

## 2014-03-26 DIAGNOSIS — Z01419 Encounter for gynecological examination (general) (routine) without abnormal findings: Secondary | ICD-10-CM

## 2014-03-26 DIAGNOSIS — I2510 Atherosclerotic heart disease of native coronary artery without angina pectoris: Secondary | ICD-10-CM

## 2014-03-26 DIAGNOSIS — E669 Obesity, unspecified: Secondary | ICD-10-CM

## 2014-03-26 DIAGNOSIS — Z8639 Personal history of other endocrine, nutritional and metabolic disease: Secondary | ICD-10-CM

## 2014-03-26 LAB — COMPREHENSIVE METABOLIC PANEL
ALBUMIN: 4 g/dL (ref 3.5–5.2)
ALT: 18 U/L (ref 0–35)
AST: 18 U/L (ref 0–37)
Alkaline Phosphatase: 75 U/L (ref 39–117)
BILIRUBIN TOTAL: 0.6 mg/dL (ref 0.2–1.2)
BUN: 14 mg/dL (ref 6–23)
CALCIUM: 9.3 mg/dL (ref 8.4–10.5)
CHLORIDE: 101 meq/L (ref 96–112)
CO2: 28 meq/L (ref 19–32)
Creat: 1.05 mg/dL (ref 0.50–1.10)
GLUCOSE: 102 mg/dL — AB (ref 70–99)
Potassium: 3.9 mEq/L (ref 3.5–5.3)
SODIUM: 138 meq/L (ref 135–145)
TOTAL PROTEIN: 7.3 g/dL (ref 6.0–8.3)

## 2014-03-26 LAB — CBC WITH DIFFERENTIAL/PLATELET
Basophils Absolute: 0 10*3/uL (ref 0.0–0.1)
Basophils Relative: 0 % (ref 0–1)
EOS ABS: 0.1 10*3/uL (ref 0.0–0.7)
Eosinophils Relative: 3 % (ref 0–5)
HCT: 37.4 % (ref 36.0–46.0)
HEMOGLOBIN: 12.8 g/dL (ref 12.0–15.0)
LYMPHS ABS: 1.8 10*3/uL (ref 0.7–4.0)
LYMPHS PCT: 39 % (ref 12–46)
MCH: 30.7 pg (ref 26.0–34.0)
MCHC: 34.2 g/dL (ref 30.0–36.0)
MCV: 89.7 fL (ref 78.0–100.0)
MONOS PCT: 5 % (ref 3–12)
Monocytes Absolute: 0.2 10*3/uL (ref 0.1–1.0)
NEUTROS PCT: 53 % (ref 43–77)
Neutro Abs: 2.5 10*3/uL (ref 1.7–7.7)
Platelets: 349 10*3/uL (ref 150–400)
RBC: 4.17 MIL/uL (ref 3.87–5.11)
RDW: 13.8 % (ref 11.5–15.5)
WBC: 4.7 10*3/uL (ref 4.0–10.5)

## 2014-03-26 LAB — LIPID PANEL
CHOLESTEROL: 226 mg/dL — AB (ref 0–200)
HDL: 63 mg/dL (ref >39)
LDL Cholesterol: 141 mg/dL — ABNORMAL HIGH (ref 0–99)
TRIGLYCERIDES: 110 mg/dL (ref <150)
Total CHOL/HDL Ratio: 3.6 Ratio
VLDL: 22 mg/dL (ref 0–40)

## 2014-03-26 NOTE — Progress Notes (Signed)
Lauren Gray 1951/04/14 [id number]   History:    64 y.o. presented to the office today for her annual GYN exam and had no complaints.Patient with prior history of TVH/BSO. Patient on no hormone replacement therapy. Patient's last bone density study was in 2012 which was normal. Patient's last colonoscopy was negative April of 2013. Patient with strong family history of cancer as follows:   Mother with history of endometrioid cancer of the ovary  Sister breast cancer age 73  Grandmother and uncle with colon cancer  Father with history of left breast lump?  Patient with no prior history of abnormal Pap smears. Patient was screened last year for hepatitis C and was negative. Last bone density study 2014 was normal. Mammogram this year was normal. Colonoscopy 2013 normal.   Past medical history,surgical history, family history and social history were all reviewed and documented in the EPIC chart.  Gynecologic History No LMP recorded. Patient has had a hysterectomy. Contraception: status post hysterectomy Last Pap: 2012. Results were: normal Last mammogram: 2015. Results were: normal  Obstetric History OB History  Gravida Para Term Preterm AB SAB TAB Ectopic Multiple Living  1 1 1       1     # Outcome Date GA Lbr Len/2nd Weight Sex Delivery Anes PTL Lv  1 TRM     F SVD  N Y       ROS: A ROS was performed and pertinent positives and negatives are included in the history.  GENERAL: No fevers or chills. HEENT: No change in vision, no earache, sore throat or sinus congestion. NECK: No pain or stiffness. CARDIOVASCULAR: No chest pain or pressure. No palpitations. PULMONARY: No shortness of breath, cough or wheeze. GASTROINTESTINAL: No abdominal pain, nausea, vomiting or diarrhea, melena or bright red blood per rectum. GENITOURINARY: No urinary frequency, urgency, hesitancy or dysuria. MUSCULOSKELETAL: No joint or muscle pain, no back pain, no recent trauma. DERMATOLOGIC: No rash, no  itching, no lesions. ENDOCRINE: No polyuria, polydipsia, no heat or cold intolerance. No recent change in weight. HEMATOLOGICAL: No anemia or easy bruising or bleeding. NEUROLOGIC: No headache, seizures, numbness, tingling or weakness. PSYCHIATRIC: No depression, no loss of interest in normal activity or change in sleep pattern.     Exam: chaperone present  BP 132/88  Ht 5' 7"  (1.702 m)  Wt 252 lb (114.306 kg)  BMI 39.46 kg/m2  Body mass index is 39.46 kg/(m^2).  General appearance : Well developed well nourished female. No acute distress HEENT: Neck supple, trachea midline, no carotid bruits, no thyroidmegaly Lungs: Clear to auscultation, no rhonchi or wheezes, or rib retractions  Heart: Regular rate and rhythm, no murmurs or gallops Breast:Examined in sitting and supine position were symmetrical in appearance, no palpable masses or tenderness,  no skin retraction, no nipple inversion, no nipple discharge, no skin discoloration, no axillary or supraclavicular lymphadenopathy Abdomen: no palpable masses or tenderness, no rebound or guarding Extremities: no edema or skin discoloration or tenderness  Pelvic:  Bartholin, Urethra, Skene Glands: Within normal limits             Vagina: No gross lesions or discharge  Cervix: No gross lesions or discharge  Uterus absent  Adnexa absent  Anus and perineum  normal   Rectovaginal  normal sphincter tone without palpated masses or tenderness             Hemoccult cards provided     Assessment/Plan:  63 y.o. female for annual exam we discussed  importance of regular exercise and diet\ as well as calcium and vitamin D intake for osteoporosis prevention. She will no longer needs Pap smears since she had a hysterectomy and has had no prior history of abnormal Pap smears according to the new guidelines. The following labs were ordered: Fasting lipid profile, comprehensive metabolic panel, vitamin D, CBC, urinalysis and TSH.  I have offered patient to  undergo BRCA one BRCA2 testing and to be referred to the [facility] and patient would like to rebound the information that I provided and decided a later date (she is hesitant)  The patient has informed me that whenever family members or cardiovascular disease and she would like to have an EKG so we will order one although she is asymptomatic. Will order one for a baseline.  Note: This dictation was prepared with  Dragon/digital dictation along withSmart phrase technology. Any transcriptional errors that result from this process are unintentional.   Terrance Mass MD, 10:17 AM 03/26/2014

## 2014-03-26 NOTE — Telephone Encounter (Signed)
Message copied by Aura CampsWEBB, JENNIFER L on Thu Mar 26, 2014  2:58 PM ------      Message from: Ok EdwardsFERNANDEZ, JUAN H      Created: Thu Mar 26, 2014 10:26 AM       Victorino DikeJennifer, please put in order for [facility] with family history of cardiovascular disease and obesity. Patient will call by later this week or next [facility]. Thank you ------

## 2014-03-26 NOTE — Patient Instructions (Signed)
Patient information: High cholesterol (The Basics)  What is cholesterol? - Cholesterol is a substance that is found in the blood. Everyone has some. It is needed for good health. The problem is, people sometimes have too much cholesterol. Compared with people with normal cholesterol, people with high cholesterol have a higher risk of heart attacks, strokes, and other health problems. The higher your cholesterol, the higher your risk of these problems. Cholesterol levels in your body are determined significantly by your diet. Cholesterol levels may also be related to heart disease. The following material helps to explain this relationship and discusses what you can do to help keep your heart healthy. Not all cholesterol is bad. Low-density lipoprotein (LDL) cholesterol is the "bad" cholesterol. It may cause fatty deposits to build up inside your arteries. High-density lipoprotein (HDL) cholesterol is "good." It helps to remove the "bad" LDL cholesterol from your blood. Cholesterol is a very important risk factor for heart disease. Other risk factors are high blood pressure, smoking, stress, heredity, and weight.  The heart muscle gets its supply of blood through the coronary arteries. If your LDL cholesterol is high and your HDL cholesterol is low, you are at risk for having fatty deposits build up in your coronary arteries. This leaves less room through which blood can flow. Without sufficient blood and oxygen, the heart muscle cannot function properly and you may feel chest pains (angina pectoris). When a coronary artery closes up entirely, a part of the heart muscle may die, causing a heart attack (myocardial infarction).  CHECKING CHOLESTEROL When your caregiver sends your blood to a lab to be analyzed for cholesterol, a complete lipid (fat) profile may be done. With this test, the total amount of cholesterol and levels of LDL and HDL are determined. Triglycerides  are a type of fat that circulates in the blood and can also be used to determine heart disease risk. Are there different types of cholesterol? - Yes, there are a few different types. If you get a cholesterol test, you may hear your doctor or nurse talk about: Total cholesterol  LDL cholesterol - Some people call this the "bad" cholesterol. That's because having high LDL levels raises your risk of heart attacks, strokes, and other health problems.  HDL cholesterol - Some people call this the "good" cholesterol. That's because having high HDL levels lowers your risk of heart attacks, strokes, and other health problems.  Non-HDL cholesterol - Non-HDL cholesterol is your total cholesterol minus your HDL cholesterol.  Triglycerides - Triglycerides are not cholesterol. They are a type of fat. But they often get measured when cholesterol is measured. (Having high triglycerides also seems to increase the risk of heart attacks and strokes.)   Keep in mind, though, that many people who cannot meet these goals still have a low risk of heart attacks and strokes. What should I do if my doctor tells me I have high cholesterol? - Ask your doctor what your overall risk of heart attacks and strokes is. High cholesterol, by itself, is not always a reason to worry. Having high cholesterol is just one of many things that can increase your risk of heart attacks and strokes. Other factors that increase your risk include:  Cigarette smoking  High blood pressure  Having a parent, sister, or brother who got heart disease at a young age (Young, in this case, means younger than 55 for men and younger than 65 for women.)  Being a man (Women are at risk, too, but men   have a higher risk.)  Older age  If you are at high risk of heart attacks and strokes, having high cholesterol is a problem. On the other hand, if you have are at low risk, having high cholesterol may not mean much. Should I take medicine to lower cholesterol? - Not  everyone who has high cholesterol needs medicines. Your doctor or nurse will decide if you need them based on your age, family history, and other health concerns.  You should probably take a cholesterol-lowering medicine called a statin if you: Already had a heart attack or stroke  Have known heart disease  Have diabetes  Have a condition called peripheral artery disease, which makes it painful to walk, and happens when the arteries in your legs get clogged with fatty deposits  Have an abdominal aortic aneurysm, which is a widening of the main artery in the belly  Most people with any of the conditions listed above should take a statin no matter what their cholesterol level is. If your doctor or nurse puts you on a statin, stay on it. The medicine may not make you feel any different. But it can help prevent heart attacks, strokes, and death.  Can I lower my cholesterol without medicines? - Yes, you can lower your cholesterol some by:  Avoiding red meat, butter, fried foods, cheese, and other foods that have a lot of saturated fat  Losing weight (if you are overweight)  Being more active Even if these steps do little to change your cholesterol, they can improve your health in many ways.                                                   Cholesterol Control Diet  CONTROLLING CHOLESTEROL WITH DIET Although exercise and lifestyle factors are important, your diet is key. That is because certain foods are known to raise cholesterol and others to lower it. The goal is to balance foods for their effect on cholesterol and more importantly, to replace saturated and trans fat with other types of fat, such as monounsaturated fat, polyunsaturated fat, and omega-3 fatty acids. On average, a person should consume no more than 15 to 17 g of saturated fat daily. Saturated and trans fats are considered "bad" fats, and they will raise LDL cholesterol. Saturated fats are primarily found in animal products such as  meats, butter, and cream. However, that does not mean you need to sacrifice all your favorite foods. Today, there are good tasting, low-fat, low-cholesterol substitutes for most of the things you like to eat. Choose low-fat or nonfat alternatives. Choose round or loin cuts of red meat, since these types of cuts are lowest in fat and cholesterol. Chicken (without the skin), fish, veal, and ground turkey breast are excellent choices. Eliminate fatty meats, such as hot dogs and salami. Even shellfish have little or no saturated fat. Have a 3 oz (85 g) portion when you eat lean meat, poultry, or fish. Trans fats are also called "partially hydrogenated oils." They are oils that have been scientifically manipulated so that they are solid at room temperature resulting in a longer shelf life and improved taste and texture of foods in which they are added. Trans fats are found in stick margarine, some tub margarines, cookies, crackers, and baked goods.  When baking and cooking, oils are an excellent substitute   for butter. The monounsaturated oils are especially beneficial since it is believed they lower LDL and raise HDL. The oils you should avoid entirely are saturated tropical oils, such as coconut and palm.  Remember to eat liberally from food groups that are naturally free of saturated and trans fat, including fish, fruit, vegetables, beans, grains (barley, rice, couscous, bulgur wheat), and pasta (without cream sauces).  IDENTIFYING FOODS THAT LOWER CHOLESTEROL  Soluble fiber may lower your cholesterol. This type of fiber is found in fruits such as apples, vegetables such as broccoli, potatoes, and carrots, legumes such as beans, peas, and lentils, and grains such as barley. Foods fortified with plant sterols (phytosterol) may also lower cholesterol. You should eat at least 2 g per day of these foods for a cholesterol lowering effect.  Read package labels to identify low-saturated fats, trans fats free, and  low-fat foods at the supermarket. Select cheeses that have only 2 to 3 g saturated fat per ounce. Use a heart-healthy tub margarine that is free of trans fats or partially hydrogenated oil. When buying baked goods (cookies, crackers), avoid partially hydrogenated oils. Breads and muffins should be made from whole grains (whole-wheat or whole oat flour, instead of "flour" or "enriched flour"). Buy non-creamy canned soups with reduced salt and no added fats.  FOOD PREPARATION TECHNIQUES  Never deep-fry. If you must fry, either stir-fry, which uses very little fat, or use non-stick cooking sprays. When possible, broil, bake, or roast meats, and steam vegetables. Instead of dressing vegetables with butter or margarine, use lemon and herbs, applesauce and cinnamon (for squash and sweet potatoes), nonfat yogurt, salsa, and low-fat dressings for salads.  LOW-SATURATED FAT / LOW-FAT FOOD SUBSTITUTES Meats / Saturated Fat (g)  Avoid: Steak, marbled (3 oz/85 g) / 11 g   Choose: Steak, lean (3 oz/85 g) / 4 g   Avoid: Hamburger (3 oz/85 g) / 7 g   Choose: Hamburger, lean (3 oz/85 g) / 5 g   Avoid: Ham (3 oz/85 g) / 6 g   Choose: Ham, lean cut (3 oz/85 g) / 2.4 g   Avoid: Chicken, with skin, dark meat (3 oz/85 g) / 4 g   Choose: Chicken, skin removed, dark meat (3 oz/85 g) / 2 g   Avoid: Chicken, with skin, light meat (3 oz/85 g) / 2.5 g   Choose: Chicken, skin removed, light meat (3 oz/85 g) / 1 g  Dairy / Saturated Fat (g)  Avoid: Whole milk (1 cup) / 5 g   Choose: Low-fat milk, 2% (1 cup) / 3 g   Choose: Low-fat milk, 1% (1 cup) / 1.5 g   Choose: Skim milk (1 cup) / 0.3 g   Avoid: Hard cheese (1 oz/28 g) / 6 g   Choose: Skim milk cheese (1 oz/28 g) / 2 to 3 g   Avoid: Cottage cheese, 4% fat (1 cup) / 6.5 g   Choose: Low-fat cottage cheese, 1% fat (1 cup) / 1.5 g   Avoid: Ice cream (1 cup) / 9 g   Choose: Sherbet (1 cup) / 2.5 g   Choose: Nonfat frozen yogurt (1 cup) / 0.3 g    Choose: Frozen fruit bar / trace   Avoid: Whipped cream (1 tbs) / 3.5 g   Choose: Nondairy whipped topping (1 tbs) / 1 g  Condiments / Saturated Fat (g)  Avoid: Mayonnaise (1 tbs) / 2 g   Choose: Low-fat mayonnaise (1 tbs) / 1 g   Avoid:   Butter (1 tbs) / 7 g   Choose: Extra light margarine (1 tbs) / 1 g   Avoid: Coconut oil (1 tbs) / 11.8 g   Choose: Olive oil (1 tbs) / 1.8 g   Choose: Corn oil (1 tbs) / 1.7 g   Choose: Safflower oil (1 tbs) / 1.2 g   Choose: Sunflower oil (1 tbs) / 1.4 g   Choose: Soybean oil (1 tbs) / 2.4 g   Choose: Canola oil (1 tbs) / 1 g  Exercise to Lose Weight Exercise and a healthy diet may help you lose weight. Your doctor may suggest specific exercises. EXERCISE IDEAS AND TIPS  Choose low-cost things you enjoy doing, such as walking, bicycling, or exercising to workout videos.   Take stairs instead of the elevator.   Walk during your lunch break.   Park your car further away from work or school.   Go to a gym or an exercise class.   Start with 5 to 10 minutes of exercise each day. Build up to 30 minutes of exercise 4 to 6 days a week.   Wear shoes with good support and comfortable clothes.   Stretch before and after working out.   Work out until you breathe harder and your heart beats faster.   Drink extra water when you exercise.   Do not do so much that you hurt yourself, feel dizzy, or get very short of breath.  Exercises that burn about 150 calories:  Running 1  miles in 15 minutes.   Playing volleyball for 45 to 60 minutes.   Washing and waxing a car for 45 to 60 minutes.   Playing touch football for 45 minutes.   Walking 1  miles in 35 minutes.   Pushing a stroller 1  miles in 30 minutes.   Playing basketball for 30 minutes.   Raking leaves for 30 minutes.   Bicycling 5 miles in 30 minutes.   Walking 2 miles in 30 minutes.   Dancing for 30 minutes.   Shoveling snow for 15 minutes.   Swimming laps  for 20 minutes.   Walking up stairs for 15 minutes.   Bicycling 4 miles in 15 minutes.   Gardening for 30 to 45 minutes.   Jumping rope for 15 minutes.   Washing windows or floors for 45 to 60 minutes.  Document Released: 12/02/2010 Document Revised: 07/12/2011 Document Reviewed: 12/02/2010 [employer] Patient Information 2012 [employer].                                           

## 2014-03-26 NOTE — Telephone Encounter (Signed)
EKG order placed

## 2014-03-27 ENCOUNTER — Other Ambulatory Visit: Payer: Self-pay | Admitting: *Deleted

## 2014-03-27 ENCOUNTER — Ambulatory Visit (HOSPITAL_COMMUNITY)
Admission: RE | Admit: 2014-03-27 | Discharge: 2014-03-27 | Disposition: A | Discharge status: Home / Self Care | Payer: BC Managed Care – PPO | Source: Ambulatory Visit | Attending: Gynecology | Admitting: Gynecology

## 2014-03-27 DIAGNOSIS — Z803 Family history of malignant neoplasm of breast: Secondary | ICD-10-CM | POA: Insufficient documentation

## 2014-03-27 DIAGNOSIS — E669 Obesity, unspecified: Secondary | ICD-10-CM | POA: Insufficient documentation

## 2014-03-27 DIAGNOSIS — Z833 Family history of diabetes mellitus: Secondary | ICD-10-CM | POA: Insufficient documentation

## 2014-03-27 DIAGNOSIS — E559 Vitamin D deficiency, unspecified: Secondary | ICD-10-CM

## 2014-03-27 DIAGNOSIS — I2510 Atherosclerotic heart disease of native coronary artery without angina pectoris: Secondary | ICD-10-CM | POA: Insufficient documentation

## 2014-03-27 DIAGNOSIS — Z8639 Personal history of other endocrine, nutritional and metabolic disease: Secondary | ICD-10-CM | POA: Insufficient documentation

## 2014-03-27 DIAGNOSIS — Z8041 Family history of malignant neoplasm of ovary: Secondary | ICD-10-CM | POA: Insufficient documentation

## 2014-03-27 LAB — URINALYSIS W MICROSCOPIC + REFLEX CULTURE
Bacteria, UA: NONE SEEN
Bilirubin Urine: NEGATIVE
CASTS: NONE SEEN
CRYSTALS: NONE SEEN
GLUCOSE, UA: NEGATIVE mg/dL
HGB URINE DIPSTICK: NEGATIVE
Ketones, ur: NEGATIVE mg/dL
LEUKOCYTES UA: NEGATIVE
NITRITE: NEGATIVE
PH: 6.5 (ref 5.0–8.0)
Protein, ur: NEGATIVE mg/dL
SPECIFIC GRAVITY, URINE: 1.023 (ref 1.005–1.030)
SQUAMOUS EPITHELIAL / LPF: NONE SEEN
Urobilinogen, UA: 0.2 mg/dL (ref 0.0–1.0)

## 2014-03-27 LAB — VITAMIN D 25 HYDROXY (VIT D DEFICIENCY, FRACTURES): VIT D 25 HYDROXY: 22 ng/mL — AB (ref 30–89)

## 2014-03-27 LAB — TSH: TSH: 1.096 u[IU]/mL (ref 0.350–4.500)

## 2014-03-27 MED ORDER — VITAMIN D (ERGOCALCIFEROL) 1.25 MG (50000 UNIT) PO CAPS: 50000.0000 [IU] | ORAL_CAPSULE | ORAL | Status: DC

## 2014-03-30 NOTE — Telephone Encounter (Signed)
Patient has EKG done on 03/27/14.

## 2014-09-14 ENCOUNTER — Encounter: Payer: Self-pay | Admitting: Gynecology

## 2014-12-22 ENCOUNTER — Other Ambulatory Visit: Payer: Self-pay | Admitting: Gynecology

## 2014-12-22 DIAGNOSIS — Z1231 Encounter for screening mammogram for malignant neoplasm of breast: Secondary | ICD-10-CM

## 2015-01-13 ENCOUNTER — Ambulatory Visit (HOSPITAL_COMMUNITY)
Admission: RE | Admit: 2015-01-13 | Discharge: 2015-01-13 | Disposition: A | Discharge status: Home / Self Care | Payer: BC Managed Care – PPO | Source: Ambulatory Visit | Attending: Gynecology | Admitting: Gynecology

## 2015-01-13 DIAGNOSIS — Z1231 Encounter for screening mammogram for malignant neoplasm of breast: Secondary | ICD-10-CM | POA: Insufficient documentation

## 2015-03-29 ENCOUNTER — Encounter: Payer: Self-pay | Admitting: Gynecology

## 2015-03-29 ENCOUNTER — Ambulatory Visit (INDEPENDENT_AMBULATORY_CARE_PROVIDER_SITE_OTHER): Payer: BC Managed Care – PPO | Admitting: Gynecology

## 2015-03-29 VITALS — BP 138/86 | Ht 68.0 in | Wt 248.0 lb

## 2015-03-29 DIAGNOSIS — Z780 Asymptomatic menopausal state: Secondary | ICD-10-CM | POA: Diagnosis not present

## 2015-03-29 DIAGNOSIS — Z8041 Family history of malignant neoplasm of ovary: Secondary | ICD-10-CM

## 2015-03-29 DIAGNOSIS — Z01419 Encounter for gynecological examination (general) (routine) without abnormal findings: Secondary | ICD-10-CM

## 2015-03-29 DIAGNOSIS — Z8639 Personal history of other endocrine, nutritional and metabolic disease: Secondary | ICD-10-CM | POA: Diagnosis not present

## 2015-03-29 LAB — CBC WITH DIFFERENTIAL/PLATELET
Basophils Absolute: 0 10*3/uL (ref 0.0–0.1)
Basophils Relative: 0 % (ref 0–1)
Eosinophils Absolute: 0.1 10*3/uL (ref 0.0–0.7)
Eosinophils Relative: 2 % (ref 0–5)
HEMATOCRIT: 38.5 % (ref 36.0–46.0)
Hemoglobin: 12.9 g/dL (ref 12.0–15.0)
Lymphocytes Relative: 42 % (ref 12–46)
Lymphs Abs: 2.1 10*3/uL (ref 0.7–4.0)
MCH: 30.7 pg (ref 26.0–34.0)
MCHC: 33.5 g/dL (ref 30.0–36.0)
MCV: 91.7 fL (ref 78.0–100.0)
MONOS PCT: 6 % (ref 3–12)
MPV: 10 fL (ref 8.6–12.4)
Monocytes Absolute: 0.3 10*3/uL (ref 0.1–1.0)
Neutro Abs: 2.6 10*3/uL (ref 1.7–7.7)
Neutrophils Relative %: 50 % (ref 43–77)
Platelets: 329 10*3/uL (ref 150–400)
RBC: 4.2 MIL/uL (ref 3.87–5.11)
RDW: 13.8 % (ref 11.5–15.5)
WBC: 5.1 10*3/uL (ref 4.0–10.5)

## 2015-03-29 LAB — COMPREHENSIVE METABOLIC PANEL
ALT: 15 U/L (ref 0–35)
AST: 18 U/L (ref 0–37)
Albumin: 3.7 g/dL (ref 3.5–5.2)
Alkaline Phosphatase: 68 U/L (ref 39–117)
BUN: 14 mg/dL (ref 6–23)
CALCIUM: 9 mg/dL (ref 8.4–10.5)
CHLORIDE: 102 meq/L (ref 96–112)
CO2: 26 mEq/L (ref 19–32)
CREATININE: 1.13 mg/dL — AB (ref 0.50–1.10)
Glucose, Bld: 90 mg/dL (ref 70–99)
Potassium: 4.7 mEq/L (ref 3.5–5.3)
SODIUM: 139 meq/L (ref 135–145)
Total Bilirubin: 0.5 mg/dL (ref 0.2–1.2)
Total Protein: 7.1 g/dL (ref 6.0–8.3)

## 2015-03-29 LAB — LIPID PANEL
Cholesterol: 209 mg/dL — ABNORMAL HIGH (ref 0–200)
HDL: 58 mg/dL (ref >=46)
LDL CALC: 134 mg/dL — AB (ref 0–99)
TRIGLYCERIDES: 86 mg/dL (ref <150)
Total CHOL/HDL Ratio: 3.6 Ratio
VLDL: 17 mg/dL (ref 0–40)

## 2015-03-29 LAB — TSH: TSH: 1.79 u[IU]/mL (ref 0.350–4.500)

## 2015-03-29 MED ORDER — BETAMETHASONE DIPROPIONATE 0.05 % EX CREA: TOPICAL_CREAM | Freq: Two times a day (BID) | CUTANEOUS | Status: DC

## 2015-03-29 NOTE — Progress Notes (Signed)
Lauren MayotteSharon K Gray 06/28/1951 [id number]   History:    64 y.o.  for annual gyn exam with no complaints today except wishing to lose weight. Patient with prior history of TVH/BSO. Patient on no hormone replacement therapy. Patient's last bone density study was in 2014.Patient's last colonoscopy was negative April of 2013. Patient with strong family history of cancer as follows:   Mother with history of endometrioid cancer of the ovary  Sister breast cancer age 64  Grandmother and uncle with colon cancer  Father with history of left breast lump?  Patient had been offered consultation with geneticist at the [facility] but has refused.  Patient with no prior history of abnormal Pap smears. Patient was screened last year for hepatitis C and was negative. Last bone density study 2014 was normal. Patient has had history vitamin D deficiency in the past and is currently taking vitamin D3 2000 units daily.  Patient suffers that time from eczema and uses betamethasone dipropionate 0.05% which she applies twice a day when necessary.  Patient has not had her shingles vaccine yet.  Past medical history,surgical history, family history and social history were all reviewed and documented in the EPIC chart.  Gynecologic History No LMP recorded. Patient has had a hysterectomy. Contraception: status post hysterectomy Last Pap: 2012. Results were: normal Last mammogram: 2016. Results were: normal  Obstetric History OB History  Gravida Para Term Preterm AB SAB TAB Ectopic Multiple Living  1 1 1       1     # Outcome Date GA Lbr Len/2nd Weight Sex Delivery Anes PTL Lv  1 Term     F Vag-Spont  N Y       ROS: A ROS was performed and pertinent positives and negatives are included in the history.  GENERAL: No fevers or chills. HEENT: No change in vision, no earache, sore throat or sinus congestion. NECK: No pain or stiffness. CARDIOVASCULAR: No chest pain or pressure. No palpitations.  PULMONARY: No shortness of breath, cough or wheeze. GASTROINTESTINAL: No abdominal pain, nausea, vomiting or diarrhea, melena or bright red blood per rectum. GENITOURINARY: No urinary frequency, urgency, hesitancy or dysuria. MUSCULOSKELETAL: No joint or muscle pain, no back pain, no recent trauma. DERMATOLOGIC: No rash, no itching, no lesions. ENDOCRINE: No polyuria, polydipsia, no heat or cold intolerance. No recent change in weight. HEMATOLOGICAL: No anemia or easy bruising or bleeding. NEUROLOGIC: No headache, seizures, numbness, tingling or weakness. PSYCHIATRIC: No depression, no loss of interest in normal activity or change in sleep pattern.     Exam: chaperone present  BP 138/86 mmHg  Ht 5\' 8"  (1.727 m)  Wt 248 lb (112.492 kg)  BMI 37.72 kg/m2  Body mass index is 37.72 kg/(m^2).  General appearance : Well developed well nourished female. No acute distress HEENT: Eyes: no retinal hemorrhage or exudates,  Neck supple, trachea midline, no carotid bruits, no thyroidmegaly Lungs: Clear to auscultation, no rhonchi or wheezes, or rib retractions  Heart: Regular rate and rhythm, no murmurs or gallops Breast:Examined in sitting and supine position were symmetrical in appearance, no palpable masses or tenderness,  no skin retraction, no nipple inversion, no nipple discharge, no skin discoloration, no axillary or supraclavicular lymphadenopathy Abdomen: no palpable masses or tenderness, no rebound or guarding Extremities: no edema or skin discoloration or tenderness  Pelvic:  Bartholin, Urethra, Skene Glands: Within normal limits             Vagina: No gross lesions or discharge,  atrophic changes Cervix: Absent  Uterus  absent  Adnexa  Without masses or tenderness  Anus and perineum  normal   Rectovaginal  normal sphincter tone without palpated masses or tenderness             Hemoccult cards provided     Assessment/Plan:  64 y.o. female for annual exam who is menopausal with minimal  vasomotor symptoms. She is going to try peppermint oil two dabs behind each ear daily to see if we can offset her symptoms. The following labs will be ordered today. Vitamin D: Comprehensive metabolic panel, fasting lipid profile, TSH, CBC, and urinalysis. Patient to schedule bone density study here in our office. Fecal Hemoccult cards provided her to Katrinka BlazingSmith to the office for testing.   Ok EdwardsFERNANDEZ,JUAN H MD, 9:38 AM 03/29/2015

## 2015-03-29 NOTE — Patient Instructions (Signed)

## 2015-03-30 ENCOUNTER — Other Ambulatory Visit: Payer: Self-pay | Admitting: Gynecology

## 2015-03-30 DIAGNOSIS — E559 Vitamin D deficiency, unspecified: Secondary | ICD-10-CM

## 2015-03-30 DIAGNOSIS — R7989 Other specified abnormal findings of blood chemistry: Secondary | ICD-10-CM

## 2015-03-30 LAB — URINALYSIS W MICROSCOPIC + REFLEX CULTURE
Bacteria, UA: NONE SEEN
Bilirubin Urine: NEGATIVE
Casts: NONE SEEN
Crystals: NONE SEEN
Glucose, UA: NEGATIVE mg/dL
Hgb urine dipstick: NEGATIVE
Ketones, ur: NEGATIVE mg/dL
Leukocytes, UA: NEGATIVE
Nitrite: NEGATIVE
Protein, ur: NEGATIVE mg/dL
Specific Gravity, Urine: 1.011 (ref 1.005–1.030)
Urobilinogen, UA: 0.2 mg/dL (ref 0.0–1.0)
pH: 6.5 (ref 5.0–8.0)

## 2015-03-30 LAB — VITAMIN D 25 HYDROXY (VIT D DEFICIENCY, FRACTURES): Vit D, 25-Hydroxy: 26 ng/mL — ABNORMAL LOW (ref 30–100)

## 2015-03-30 MED ORDER — VITAMIN D (ERGOCALCIFEROL) 1.25 MG (50000 UNIT) PO CAPS: 50000.0000 [IU] | ORAL_CAPSULE | ORAL | Status: DC

## 2015-04-08 ENCOUNTER — Ambulatory Visit (INDEPENDENT_AMBULATORY_CARE_PROVIDER_SITE_OTHER): Payer: BC Managed Care – PPO

## 2015-04-08 ENCOUNTER — Other Ambulatory Visit: Payer: Self-pay | Admitting: Gynecology

## 2015-04-08 ENCOUNTER — Other Ambulatory Visit: Payer: BC Managed Care – PPO

## 2015-04-08 DIAGNOSIS — Z780 Asymptomatic menopausal state: Secondary | ICD-10-CM | POA: Diagnosis not present

## 2015-04-08 DIAGNOSIS — Z13820 Encounter for screening for osteoporosis: Secondary | ICD-10-CM

## 2015-04-08 DIAGNOSIS — R7989 Other specified abnormal findings of blood chemistry: Secondary | ICD-10-CM

## 2015-04-08 DIAGNOSIS — E559 Vitamin D deficiency, unspecified: Secondary | ICD-10-CM

## 2015-04-09 LAB — VITAMIN D 25 HYDROXY (VIT D DEFICIENCY, FRACTURES): Vit D, 25-Hydroxy: 34 ng/mL (ref 30–100)

## 2015-04-13 LAB — CREATININE, SERUM: Creat: 1.17 mg/dL — ABNORMAL HIGH (ref 0.50–1.10)

## 2015-04-13 LAB — BUN+CREAT: BUN/Creatinine Ratio: 12.8 Ratio

## 2015-04-13 LAB — BUN: BUN: 15 mg/dL (ref 6–23)

## 2015-04-20 ENCOUNTER — Telehealth: Payer: Self-pay | Admitting: *Deleted

## 2015-04-20 NOTE — Telephone Encounter (Signed)
Pt called and left message in voicemail to call. I called her back and left her a message for her to return my call.

## 2015-04-21 ENCOUNTER — Other Ambulatory Visit: Payer: Self-pay | Admitting: Gynecology

## 2015-04-21 DIAGNOSIS — R7989 Other specified abnormal findings of blood chemistry: Secondary | ICD-10-CM

## 2015-05-19 ENCOUNTER — Other Ambulatory Visit: Payer: Self-pay | Admitting: Gynecology

## 2015-05-19 DIAGNOSIS — E559 Vitamin D deficiency, unspecified: Secondary | ICD-10-CM

## 2015-06-11 ENCOUNTER — Telehealth: Payer: Self-pay | Admitting: *Deleted

## 2015-06-11 ENCOUNTER — Encounter: Payer: Self-pay | Admitting: Gynecology

## 2015-06-11 ENCOUNTER — Ambulatory Visit (INDEPENDENT_AMBULATORY_CARE_PROVIDER_SITE_OTHER): Payer: BC Managed Care – PPO | Admitting: Gynecology

## 2015-06-11 ENCOUNTER — Telehealth: Payer: Self-pay

## 2015-06-11 ENCOUNTER — Ambulatory Visit (HOSPITAL_COMMUNITY)
Admission: RE | Admit: 2015-06-11 | Discharge: 2015-06-11 | Disposition: A | Discharge status: Home / Self Care | Payer: BC Managed Care – PPO | Source: Ambulatory Visit | Attending: Gynecology | Admitting: Gynecology

## 2015-06-11 VITALS — BP 134/86

## 2015-06-11 DIAGNOSIS — M25561 Pain in right knee: Secondary | ICD-10-CM

## 2015-06-11 DIAGNOSIS — M79605 Pain in left leg: Secondary | ICD-10-CM

## 2015-06-11 DIAGNOSIS — M7989 Other specified soft tissue disorders: Secondary | ICD-10-CM | POA: Diagnosis not present

## 2015-06-11 DIAGNOSIS — R1032 Left lower quadrant pain: Secondary | ICD-10-CM

## 2015-06-11 DIAGNOSIS — R1030 Lower abdominal pain, unspecified: Secondary | ICD-10-CM | POA: Insufficient documentation

## 2015-06-11 LAB — URINALYSIS W MICROSCOPIC + REFLEX CULTURE
Bilirubin Urine: NEGATIVE
Crystals: NONE SEEN [HPF]
Glucose, UA: NEGATIVE
Hgb urine dipstick: NEGATIVE
LEUKOCYTES UA: NEGATIVE
Nitrite: NEGATIVE
RBC / HPF: NONE SEEN RBC/HPF (ref <=2)
Specific Gravity, Urine: 1.025 (ref 1.001–1.035)
WBC UA: NONE SEEN WBC/HPF (ref <=5)
Yeast: NONE SEEN [HPF]
pH: 6 (ref 5.0–8.0)

## 2015-06-11 NOTE — Telephone Encounter (Signed)
If her Doppler study was negative I recommended that she be seen there in the emergency room for further evaluation they will determine what x-ray she may need

## 2015-06-11 NOTE — Telephone Encounter (Signed)
patient doppler was negative for DVT. She said you mention to her an x-ray if this was negative? Please advise

## 2015-06-11 NOTE — Telephone Encounter (Signed)
Error, encounter already opened

## 2015-06-11 NOTE — Telephone Encounter (Addendum)
Patient called complaining of cramp in thigh and groin and radiated into upper calf.  It initially began last week and is continuing. She said when she is up and walking it is no bother to her. It is at night when she lies in bed that it bothers her. It cramps and feels sore. She does not have a PCP stating that you are her PCP.   I recommended she seek care at [facility] and [facility] today. I explained that it is a walk-in emergency office although once she is established she can make appointment.  She will have to wait to be seen but since this is persisting she really needs to go there. She said she would prefer to make an appointment with a doctor and I explained to her how all the PCPs are running so far out with new patient appointments. I told her on a Friday I thought she would benefit from a walk-in facility or even the ER if she did not want to do that. I stressed to her importance of having this checked since persisting x one week.

## 2015-06-11 NOTE — Telephone Encounter (Signed)
Another telephone encounter made for this

## 2015-06-11 NOTE — Progress Notes (Signed)
   Patient is a 64 year old who was seen in the office on May of this year for her annual gynecological exam and was asymptomatic. Patient stated that approximately 2 days ago when she was getting up she felt some low back discomfort and radiate to her groin and down to her left leg. She cannot find a comfortable position while laying in bed. And on flexion and extension she feels groin pain. She denies any shortness of breath. Patient has a prior history of TVH/BSO. Patient on no hormone replacement therapy. Patient is a nonsmoker and has no history of hypertension.   Exam: Blood pressure 134/86  patient's 5 feet 8 inches tall with a weight 148 pounds and BMI 37.72 kg meter square  Exam: Lungs clear to auscultation Rogers or wheezes Heart: Regular rate and rhythm no murmurs or gallops Back: No CVA tenderness Abdomen: Soft nontender no rebound or guarding Tenderness in the left groin area Pelvic exam Bartholin urethra Skene was within normal limits Vagina: No lesions or discharge Bimanual exam no masses or tenderness Rectal exam not done  Both lower extremities were examined. There was negative Homan sign. Negative heel tap sign, negative psoas sign, negative operator sign. Both calves were measured and were equal and measurement. There was point tenderness the medial aspect of the left thigh extending to the left popliteal fossa. She was exquisitely tender on flexion and extension of the left lower extremity. There was good popliteal and dorsalis pedis pulses, Popliteal pulses were difficult to feel on left leg and right probably  secondary to patient's obesity.  Urinalysis few bacteria and some hyaline cast been no evidence of infection or any blood in her urine  Assessment/plan: Based on patient's clinical presentation and findings on examination next 1 highly suspicious for possible DVT. Patient will be sent immediately to the vascular lab for Doppler flow studies will await results at that  time and determine with a d-dimer will be ordered as well in consultation with vascular surgeon. The above tests are negative she will be seen there in the emergency room for further evaluation.

## 2015-06-11 NOTE — Progress Notes (Signed)
Left lower extremity venous duplex completed.  Left:  No evidence of DVT, superficial thrombosis, or Baker's cyst.  Right:  Negative for DVT in the common femoral vein.  

## 2015-06-11 NOTE — Addendum Note (Signed)
Addended by: Berna Spare A on: 06/11/2015 02:53 PM   Modules accepted: Orders

## 2015-06-11 NOTE — Telephone Encounter (Signed)
-----   Message from Keenan Bachelor, [place] sent at 06/11/2015 10:26 AM EDT ----- Regarding: Dopper Flow Study for this afternoon Patient coming at 12:30pm to see Dr. Glenetta Hew- Cramp in left thigh radiating into calf x one week-LEFT leg. He wants you to go ahead and schedule a dopper flow study for after her appointment here. Thanks!

## 2015-06-11 NOTE — Telephone Encounter (Signed)
Per Dr. Glenetta Hew he will see patient at 12:30pm today. Patient was informed. He wants Victorino Dike to schedule doppler flow study for after the visit and I have forwarded that information to her.  It is her LEFT leg.

## 2015-06-11 NOTE — Telephone Encounter (Signed)
Appointment today informed to sister to go to [facility] to 1st floor. Order placed.

## 2015-06-11 NOTE — Telephone Encounter (Signed)
Please call [facility] and make an appointment for her to see today as a referral for many to Dr. Deborha Payment or anyone in her group. She is to be seen to rule out DVT

## 2015-06-11 NOTE — Telephone Encounter (Signed)
Pt informed with the below note. 

## 2015-06-12 LAB — URINE CULTURE

## 2015-07-09 ENCOUNTER — Encounter: Payer: Self-pay | Admitting: Gynecology

## 2015-07-09 ENCOUNTER — Telehealth: Payer: Self-pay | Admitting: *Deleted

## 2015-07-09 ENCOUNTER — Ambulatory Visit (INDEPENDENT_AMBULATORY_CARE_PROVIDER_SITE_OTHER): Payer: BC Managed Care – PPO | Admitting: Gynecology

## 2015-07-09 VITALS — BP 138/90

## 2015-07-09 DIAGNOSIS — M25552 Pain in left hip: Secondary | ICD-10-CM

## 2015-07-09 NOTE — Telephone Encounter (Signed)
Per Dr.Fernandez pt will need referral appointment with orthopedic. Pt scheduled on 07/13/15 @ 2:45pm with Dr.Caffrey. Pt aware.

## 2015-07-09 NOTE — Progress Notes (Signed)
   Patient presented to the office today with persistence of left hip and groin pain. Patient was seen in the office on 06/11/2015 with similar complaint in addition she had stated that the discomfort radiated to her  leg.She cannot find a comfortable position while laying in bed. And on flexion and extension she feels groin pain. She denies any shortness of breath. Patient has a prior history of TVH/BSO. Patient on no hormone replacement therapy. Patient is a nonsmoker and has no history of hypertension.  Doing her examination the following was noted on the last office visit: "Both lower extremities were examined. There was negative Homan sign. Negative heel tap sign, negative psoas sign, negative operator sign. Both calves were measured and were equal and measurement. There was point tenderness the medial aspect of the left thigh extending to the left popliteal fossa. She was exquisitely tender on flexion and extension of the left lower extremity. There was good popliteal and dorsalis pedis pulses, Popliteal pulses were difficult to feel on left leg and right probably secondary to patient's obesity."  With these findings there was concerns of the possibility of underlying DVT so she was sent to the vascular lab for Doppler flow studies and the following was reported:  "Left lower extremity venous duplex completed. Left: No evidence of DVT, superficial thrombosis, or Baker's cyst. Right: Negative for DVT in the common femoral vein."  Patient describes her pain mostly when she lays on her left hip side.  Exam: Back: No CVA tenderness Abdomen: Soft nontender no rebound or guarding Pelvic exam not done Lower extremities no calf tenderness negative Homans sign tenderness over the left iliac crest on flexion and on laying on her side and on pressure application.  Assessment/plan: Patient with chronic left hip pain today for the first time pinpointed area was noted at the top of the left iliac  crest. Arrangements were made for patient to follow-up with the orthopedic surgeon.

## 2015-11-15 ENCOUNTER — Telehealth: Payer: Self-pay

## 2015-11-15 NOTE — Telephone Encounter (Signed)
Patient said x one week she has had flu-like symptoms. Now the fever is gone but she still has congestion in her throat.  She asked if you could send in Rx to "get rid of this congestion".    I called her back and told her that a viral URI infection is going around. She said she is feeling much better but still congestion lingers. I told her for an antibiotic she would need to be seen to be assessed to see if needed.  She said she might wait a couple more days just to see if it will clear on her on. I recommended she get Guaifenesen tabs OTC and take as directed, saline nasal spray and increase fluids.  She will call prn if feels like office visit needed.

## 2016-01-19 ENCOUNTER — Other Ambulatory Visit: Payer: Self-pay

## 2016-01-19 DIAGNOSIS — Z1231 Encounter for screening mammogram for malignant neoplasm of breast: Secondary | ICD-10-CM

## 2016-02-03 ENCOUNTER — Ambulatory Visit
Admission: RE | Admit: 2016-02-03 | Discharge: 2016-02-03 | Disposition: A | Discharge status: Home / Self Care | Payer: BC Managed Care – PPO | Source: Ambulatory Visit

## 2016-02-03 DIAGNOSIS — Z1231 Encounter for screening mammogram for malignant neoplasm of breast: Secondary | ICD-10-CM

## 2016-04-28 ENCOUNTER — Ambulatory Visit (INDEPENDENT_AMBULATORY_CARE_PROVIDER_SITE_OTHER): Payer: BC Managed Care – PPO | Admitting: Gynecology

## 2016-04-28 ENCOUNTER — Encounter: Payer: Self-pay | Admitting: Gynecology

## 2016-04-28 VITALS — BP 132/84 | Ht 68.0 in | Wt 250.0 lb

## 2016-04-28 DIAGNOSIS — Z01419 Encounter for gynecological examination (general) (routine) without abnormal findings: Secondary | ICD-10-CM | POA: Diagnosis not present

## 2016-04-28 DIAGNOSIS — Z8639 Personal history of other endocrine, nutritional and metabolic disease: Secondary | ICD-10-CM

## 2016-04-28 LAB — LIPID PANEL
Cholesterol: 227 mg/dL — ABNORMAL HIGH (ref 125–200)
HDL: 70 mg/dL (ref >=46)
LDL Cholesterol: 132 mg/dL — ABNORMAL HIGH (ref <130)
TRIGLYCERIDES: 123 mg/dL (ref <150)
Total CHOL/HDL Ratio: 3.2 Ratio (ref <=5.0)
VLDL: 25 mg/dL (ref <30)

## 2016-04-28 LAB — COMPREHENSIVE METABOLIC PANEL
ALBUMIN: 3.9 g/dL (ref 3.6–5.1)
ALT: 15 U/L (ref 6–29)
AST: 15 U/L (ref 10–35)
Alkaline Phosphatase: 75 U/L (ref 33–130)
BILIRUBIN TOTAL: 0.5 mg/dL (ref 0.2–1.2)
BUN: 13 mg/dL (ref 7–25)
CO2: 26 mmol/L (ref 20–31)
Calcium: 9.3 mg/dL (ref 8.6–10.4)
Chloride: 105 mmol/L (ref 98–110)
Creat: 1.1 mg/dL — ABNORMAL HIGH (ref 0.50–0.99)
Glucose, Bld: 102 mg/dL — ABNORMAL HIGH (ref 65–99)
Potassium: 5 mmol/L (ref 3.5–5.3)
Sodium: 142 mmol/L (ref 135–146)
Total Protein: 7.4 g/dL (ref 6.1–8.1)

## 2016-04-28 LAB — CBC WITH DIFFERENTIAL/PLATELET
Basophils Absolute: 0 cells/uL (ref 0–200)
Basophils Relative: 0 %
EOS PCT: 3 %
Eosinophils Absolute: 135 cells/uL (ref 15–500)
HCT: 39.4 % (ref 35.0–45.0)
HEMOGLOBIN: 13.1 g/dL (ref 11.7–15.5)
Lymphocytes Relative: 45 %
Lymphs Abs: 2025 cells/uL (ref 850–3900)
MCH: 30.6 pg (ref 27.0–33.0)
MCHC: 33.2 g/dL (ref 32.0–36.0)
MCV: 92.1 fL (ref 80.0–100.0)
MONOS PCT: 5 %
MPV: 10.2 fL (ref 7.5–12.5)
Monocytes Absolute: 225 cells/uL (ref 200–950)
NEUTROS ABS: 2115 {cells}/uL (ref 1500–7800)
NEUTROS PCT: 47 %
PLATELETS: 324 10*3/uL (ref 140–400)
RBC: 4.28 MIL/uL (ref 3.80–5.10)
RDW: 13.4 % (ref 11.0–15.0)
WBC: 4.5 10*3/uL (ref 3.8–10.8)

## 2016-04-28 LAB — TSH: TSH: 1.34 m[IU]/L

## 2016-04-28 NOTE — Progress Notes (Signed)
Lauren MayotteSharon K Gray 02/10/1951 [id number]   History:    65 y.o.  for annual gyn exam with no complaints today. Review of patient's record indicated she had a prior history of a total abdominal hysterectomy for fibroid uterus. There was incorrect documentation on previous annual exam or by history of present illness had indicated she had a TVH which was incorrect. Several years later she had laparoscopic bilateral salpingo-oophorectomy. Patient currently on no hormone replacement therapy. Her last bone density study in 2016 was normal. Patient prior to her hysterectomy had no history of any abnormal Pap smear. She has had history vitamin D deficiency in the past.  Patient's family history of cancer as follows: Mother with history of endometrioid cancer of the ovary  Sister breast cancer age 65  Grandmother and uncle with colon cancer  Father with history of left breast lump?  Patient had been offered consultation with geneticist at the [facility] but has refused.   Past medical history,surgical history, family history and social history were all reviewed and documented in the EPIC chart.  Gynecologic History No LMP recorded. Patient has had a hysterectomy. Contraception: status post hysterectomy Last Pap: 2012. Results were: normal Last mammogram: 2017. Results were: normal  Obstetric History OB History  Gravida Para Term Preterm AB SAB TAB Ectopic Multiple Living  1 1 1       1     # Outcome Date GA Lbr Len/2nd Weight Sex Delivery Anes PTL Lv  1 Term     F Vag-Spont  N Y       ROS: A ROS was performed and pertinent positives and negatives are included in the history.  GENERAL: No fevers or chills. HEENT: No change in vision, no earache, sore throat or sinus congestion. NECK: No pain or stiffness. CARDIOVASCULAR: No chest pain or pressure. No palpitations. PULMONARY: No shortness of breath, cough or wheeze. GASTROINTESTINAL: No abdominal pain, nausea, vomiting or  diarrhea, melena or bright red blood per rectum. GENITOURINARY: No urinary frequency, urgency, hesitancy or dysuria. MUSCULOSKELETAL: No joint or muscle pain, no back pain, no recent trauma. DERMATOLOGIC: No rash, no itching, no lesions. ENDOCRINE: No polyuria, polydipsia, no heat or cold intolerance. No recent change in weight. HEMATOLOGICAL: No anemia or easy bruising or bleeding. NEUROLOGIC: No headache, seizures, numbness, tingling or weakness. PSYCHIATRIC: No depression, no loss of interest in normal activity or change in sleep pattern.     Exam: chaperone present  BP 132/84 mmHg  Ht 5\' 8"  (1.727 m)  Wt 250 lb (113.399 kg)  BMI 38.02 kg/m2  Body mass index is 38.02 kg/(m^2).  General appearance : Well developed well nourished female. No acute distress HEENT: Eyes: no retinal hemorrhage or exudates,  Neck supple, trachea midline, no carotid bruits, no thyroidmegaly Lungs: Clear to auscultation, no rhonchi or wheezes, or rib retractions  Heart: Regular rate and rhythm, no murmurs or gallops Breast:Examined in sitting and supine position were symmetrical in appearance, no palpable masses or tenderness,  no skin retraction, no nipple inversion, no nipple discharge, no skin discoloration, no axillary or supraclavicular lymphadenopathy Abdomen: no palpable masses or tenderness, no rebound or guarding Extremities: no edema or skin discoloration or tenderness  Pelvic:  Bartholin, Urethra, Skene Glands: Within normal limits             Vagina: No gross lesions or discharge  Cervix: Absent  Uterus  absent  Adnexa  Without masses or tenderness  Anus and perineum  normal  Rectovaginal  normal sphincter tone without palpated masses or tenderness             Hemoccult cards provided     Assessment/Plan:  65 y.o. female for annual exam will have the following screening blood work: Comprehensive metabolic panel, fasting lipid profile, TSH, CBC, and urinalysis. Pap smear not indicated according  to new guidelines. Because of patient's history vitamin D deficiency we will check her vitamin D level today. She was provided with fecal Hemoccult cards to submit to the office for testing. We discussed importance of monthly breast exams as well as importance of calcium vitamin D and regular weightbearing exercises for osteoporosis prevention.   Ok Edwards MD, 10:59 AM 04/28/2016

## 2016-04-29 LAB — URINALYSIS W MICROSCOPIC + REFLEX CULTURE
BILIRUBIN URINE: NEGATIVE
CRYSTALS: NONE SEEN [HPF]
Casts: NONE SEEN [LPF]
GLUCOSE, UA: NEGATIVE
Hgb urine dipstick: NEGATIVE
LEUKOCYTES UA: NEGATIVE
Nitrite: NEGATIVE
SPECIFIC GRAVITY, URINE: 1.026 (ref 1.001–1.035)
Yeast: NONE SEEN [HPF]
pH: 6 (ref 5.0–8.0)

## 2016-04-29 LAB — VITAMIN D 25 HYDROXY (VIT D DEFICIENCY, FRACTURES): Vit D, 25-Hydroxy: 34 ng/mL (ref 30–100)

## 2016-04-30 LAB — URINE CULTURE
COLONY COUNT: NO GROWTH
ORGANISM ID, BACTERIA: NO GROWTH

## 2016-05-02 ENCOUNTER — Other Ambulatory Visit: Payer: Self-pay | Admitting: Gynecology

## 2016-05-02 DIAGNOSIS — R7989 Other specified abnormal findings of blood chemistry: Secondary | ICD-10-CM

## 2016-10-30 ENCOUNTER — Telehealth: Payer: Self-pay | Admitting: *Deleted

## 2016-10-30 NOTE — Telephone Encounter (Signed)
Patient called asking if you could recommend a couple of PCP names she could schedule new patient appointment? Please advise

## 2016-10-30 NOTE — Telephone Encounter (Signed)
Pt husband informed with the below note, refill relay to the patient.

## 2016-10-30 NOTE — Telephone Encounter (Signed)
Line busy

## 2016-10-30 NOTE — Telephone Encounter (Signed)
Joselyn ArrowEve Knapp, Dr. Neomia DearAvaa, or Ailene ArdsKristie Smith

## 2016-12-25 ENCOUNTER — Other Ambulatory Visit: Payer: Self-pay | Admitting: Gynecology

## 2016-12-25 DIAGNOSIS — Z1231 Encounter for screening mammogram for malignant neoplasm of breast: Secondary | ICD-10-CM

## 2017-02-13 ENCOUNTER — Ambulatory Visit
Admission: RE | Admit: 2017-02-13 | Discharge: 2017-02-13 | Disposition: A | Discharge status: Home / Self Care | Payer: BC Managed Care – PPO | Source: Ambulatory Visit | Attending: Gynecology | Admitting: Gynecology

## 2017-02-13 DIAGNOSIS — Z1231 Encounter for screening mammogram for malignant neoplasm of breast: Secondary | ICD-10-CM

## 2017-03-28 ENCOUNTER — Encounter: Payer: Self-pay | Admitting: Gynecology

## 2017-05-04 ENCOUNTER — Ambulatory Visit (INDEPENDENT_AMBULATORY_CARE_PROVIDER_SITE_OTHER): Payer: BC Managed Care – PPO | Admitting: Gynecology

## 2017-05-04 ENCOUNTER — Telehealth: Payer: Self-pay | Admitting: *Deleted

## 2017-05-04 ENCOUNTER — Encounter: Payer: Self-pay | Admitting: Gynecology

## 2017-05-04 VITALS — BP 128/80 | Ht 68.0 in | Wt 249.0 lb

## 2017-05-04 DIAGNOSIS — R635 Abnormal weight gain: Secondary | ICD-10-CM | POA: Diagnosis not present

## 2017-05-04 DIAGNOSIS — Z713 Dietary counseling and surveillance: Secondary | ICD-10-CM

## 2017-05-04 DIAGNOSIS — Z8049 Family history of malignant neoplasm of other genital organs: Secondary | ICD-10-CM | POA: Diagnosis not present

## 2017-05-04 DIAGNOSIS — Z803 Family history of malignant neoplasm of breast: Secondary | ICD-10-CM | POA: Diagnosis not present

## 2017-05-04 DIAGNOSIS — Z01419 Encounter for gynecological examination (general) (routine) without abnormal findings: Secondary | ICD-10-CM

## 2017-05-04 DIAGNOSIS — Z780 Asymptomatic menopausal state: Secondary | ICD-10-CM | POA: Diagnosis not present

## 2017-05-04 DIAGNOSIS — E559 Vitamin D deficiency, unspecified: Secondary | ICD-10-CM | POA: Diagnosis not present

## 2017-05-04 LAB — CBC WITH DIFFERENTIAL/PLATELET
BASOS PCT: 0 %
Basophils Absolute: 0 cells/uL (ref 0–200)
EOS ABS: 250 {cells}/uL (ref 15–500)
Eosinophils Relative: 5 %
HEMATOCRIT: 39.2 % (ref 35.0–45.0)
Hemoglobin: 12.8 g/dL (ref 11.7–15.5)
Lymphocytes Relative: 42 %
Lymphs Abs: 2100 cells/uL (ref 850–3900)
MCH: 30 pg (ref 27.0–33.0)
MCHC: 32.7 g/dL (ref 32.0–36.0)
MCV: 92 fL (ref 80.0–100.0)
MONO ABS: 300 {cells}/uL (ref 200–950)
MPV: 10.2 fL (ref 7.5–12.5)
Monocytes Relative: 6 %
NEUTROS ABS: 2350 {cells}/uL (ref 1500–7800)
Neutrophils Relative %: 47 %
PLATELETS: 336 10*3/uL (ref 140–400)
RBC: 4.26 MIL/uL (ref 3.80–5.10)
RDW: 13.8 % (ref 11.0–15.0)
WBC: 5 10*3/uL (ref 3.8–10.8)

## 2017-05-04 LAB — COMPREHENSIVE METABOLIC PANEL
ALK PHOS: 75 U/L (ref 33–130)
ALT: 14 U/L (ref 6–29)
AST: 15 U/L (ref 10–35)
Albumin: 3.9 g/dL (ref 3.6–5.1)
BILIRUBIN TOTAL: 0.5 mg/dL (ref 0.2–1.2)
BUN: 13 mg/dL (ref 7–25)
CALCIUM: 9.4 mg/dL (ref 8.6–10.4)
CO2: 25 mmol/L (ref 20–31)
Chloride: 103 mmol/L (ref 98–110)
Creat: 1.17 mg/dL — ABNORMAL HIGH (ref 0.50–0.99)
Glucose, Bld: 92 mg/dL (ref 65–99)
Potassium: 4.8 mmol/L (ref 3.5–5.3)
Sodium: 140 mmol/L (ref 135–146)
TOTAL PROTEIN: 7.2 g/dL (ref 6.1–8.1)

## 2017-05-04 LAB — LIPID PANEL
CHOLESTEROL: 219 mg/dL — AB (ref <200)
HDL: 60 mg/dL (ref >50)
LDL Cholesterol: 134 mg/dL — ABNORMAL HIGH (ref <100)
Total CHOL/HDL Ratio: 3.7 Ratio (ref <5.0)
Triglycerides: 125 mg/dL (ref <150)
VLDL: 25 mg/dL (ref <30)

## 2017-05-04 MED ORDER — PHENTERMINE HCL 37.5 MG PO CAPS: 37.5000 mg | ORAL_CAPSULE | ORAL | 2 refills | Status: DC

## 2017-05-04 NOTE — Progress Notes (Signed)
Lauren Gray 1951/10/25 [id number]   History:    66 y.o.  for annual gyn exam who is complaining is continuing weight gain. She currently weighs 249 pounds. 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 Several years later she had a laparoscopic bilateral salpingo-oophorectomy. She is on no hormone replacement therapy. She had a normal bone density study in 2016. Prior to patient's hysterectomy she had no history of any abnormal Pap smear. She has had history vitamin D deficiency in the past. She had a normal colonoscopy in 2013. Uncle and grandmother with history of colon cancer. Patient making arrangements to have a colonoscopy this year.  Past medical history,surgical history, family history and social history were all reviewed and documented in the EPIC chart.  Gynecologic History No LMP recorded. Patient has had a hysterectomy. Contraception: status post hysterectomy Last Pap: 2012. Results were: normal Last mammogram: 2018. Results were: normal  Obstetric History OB History  Gravida Para Term Preterm AB Living  1 1 1     1   SAB TAB Ectopic Multiple Live Births          1    # Outcome Date GA Lbr Len/2nd Weight Sex Delivery Anes PTL Lv  1 Term     F Vag-Spont  N LIV       ROS: A ROS was performed and pertinent positives and negatives are included in the history.  GENERAL: No fevers or chills. HEENT: No change in vision, no earache, sore throat or sinus congestion. NECK: No pain or stiffness. CARDIOVASCULAR: No chest pain or pressure. No palpitations. PULMONARY: No shortness of breath, cough or wheeze. GASTROINTESTINAL: No abdominal pain, nausea, vomiting or diarrhea, melena or bright red blood per rectum. GENITOURINARY: No urinary frequency, urgency, hesitancy or dysuria. MUSCULOSKELETAL: No joint or muscle pain, no back pain, no recent trauma. DERMATOLOGIC: No rash, no itching, no lesions. ENDOCRINE: No  polyuria, polydipsia, no heat or cold intolerance. No recent change in weight. HEMATOLOGICAL: No anemia or easy bruising or bleeding. NEUROLOGIC: No headache, seizures, numbness, tingling or weakness. PSYCHIATRIC: No depression, no loss of interest in normal activity or change in sleep pattern.     Exam: chaperone present  BP 128/80   Ht 5\' 8"  (1.727 m)   Wt 249 lb (112.9 kg)   BMI 37.86 kg/m   Body mass index is 37.86 kg/m.  General appearance : Well developed well nourished female. No acute distress HEENT: Eyes: no retinal hemorrhage or exudates,  Neck supple, trachea midline, no carotid bruits, no thyroidmegaly Lungs: Clear to auscultation, no rhonchi or wheezes, or rib retractions  Heart: Regular rate and rhythm, no murmurs or gallops Breast:Examined in sitting and supine position were symmetrical in appearance, no palpable masses or tenderness,  no skin retraction, no nipple inversion, no nipple discharge, no skin discoloration, no axillary or supraclavicular lymphadenopathy Abdomen: no palpable masses or tenderness, no rebound or guarding Extremities: no edema or skin discoloration or tenderness  Pelvic:  Bartholin, Urethra, Skene Glands: Within normal limits             Vagina: No gross lesions or discharge  Cervix: Absent  Uterus  absent  Adnexa  Without masses or tenderness  Anus and perineum  normal   Rectovaginal  normal sphincter tone without palpated masses or tenderness             Hemoccult colonoscopy this year     Assessment/Plan:  66 y.o. female for annual exam who is overweight with a current weight 249 pounds with a BMI 37.86 kg/m. Patient would like assistance in helping her lose weight. We are going to check her TSH today. We are going to refer her to the nutritionist for dietary counseling guidance. We discussed appetite suppressant agent such as phentermine 37.5 mg daily which she would like to start. I've recommended only for 3 months. Potential risk for  this medication that were discussed with her include pulmonary hypertension, and cardiac valvular disease. Patient fully understands and accepts. She will return back to the office in 4 weeks for pulmonary auscultation blood pressure reading and cardiac auscultation and weight measurement. Since she is fasting today and fasting lipid profile along with comprehensive metabolic panel, and CBC was obtained today. Pap smear no longer indicated. Patient also discuss her bone density study for next month. Patient will be following up with her gastroenterologist this year for a screening colonoscopy secondary to strong family history of colon cancer.  Additional 15 minutes was spent discussing weight management treatment   Ok EdwardsFERNANDEZ,JUAN H MD, 9:41 AM 05/04/2017

## 2017-05-04 NOTE — Telephone Encounter (Signed)
-----   Message from Ok EdwardsJuan H Fernandez, MD sent at 05/04/2017  9:41 AM EDT ----- Victorino DikeJennifer please [facility] for weight control diet

## 2017-05-04 NOTE — Telephone Encounter (Signed)
Referral placed at [facility] they will contact pt to schedule.  

## 2017-05-04 NOTE — Patient Instructions (Signed)

## 2017-05-05 LAB — URINALYSIS W MICROSCOPIC + REFLEX CULTURE
BILIRUBIN URINE: NEGATIVE
Bacteria, UA: NONE SEEN [HPF]
Casts: NONE SEEN [LPF]
Crystals: NONE SEEN [HPF]
GLUCOSE, UA: NEGATIVE
Hgb urine dipstick: NEGATIVE
Ketones, ur: NEGATIVE
LEUKOCYTES UA: NEGATIVE
NITRITE: NEGATIVE
PROTEIN: NEGATIVE
RBC / HPF: NONE SEEN RBC/HPF (ref <=2)
SPECIFIC GRAVITY, URINE: 1.016 (ref 1.001–1.035)
Squamous Epithelial / LPF: NONE SEEN [HPF] (ref <=5)
WBC UA: NONE SEEN WBC/HPF (ref <=5)
YEAST: NONE SEEN [HPF]
pH: 7 (ref 5.0–8.0)

## 2017-05-05 LAB — TSH: TSH: 1.49 m[IU]/L

## 2017-05-05 LAB — VITAMIN D 25 HYDROXY (VIT D DEFICIENCY, FRACTURES): VIT D 25 HYDROXY: 31 ng/mL (ref 30–100)

## 2017-05-09 ENCOUNTER — Telehealth: Payer: Self-pay | Admitting: *Deleted

## 2017-05-09 MED ORDER — PHENTERMINE HCL 37.5 MG PO CAPS: 37.5000 mg | ORAL_CAPSULE | ORAL | 2 refills | Status: DC

## 2017-05-09 NOTE — Telephone Encounter (Signed)
Pt called stating phentermine 37.5 mg from OV 05/04/17 was not at pharmacy. I explained to patient Rx must be called in, Rx called in pt aware.

## 2017-05-15 NOTE — Telephone Encounter (Signed)
[facility] received referral they have left 2 message for pt to call to schedule.

## 2017-05-25 ENCOUNTER — Encounter: Payer: Self-pay | Admitting: Gynecology

## 2017-05-25 ENCOUNTER — Ambulatory Visit (INDEPENDENT_AMBULATORY_CARE_PROVIDER_SITE_OTHER): Payer: BC Managed Care – PPO | Admitting: Gynecology

## 2017-05-25 VITALS — BP 142/80 | Ht 68.0 in | Wt 242.0 lb

## 2017-05-25 DIAGNOSIS — R635 Abnormal weight gain: Secondary | ICD-10-CM | POA: Diagnosis not present

## 2017-05-25 NOTE — Progress Notes (Signed)
   Patient is a 66 year old that was seen June 22 for her annual exam and was complaining of continued weight gain. She was weighing 249 pounds with a BMI of 37.86 and had been referred for nutritional counseling and we had discussed a regular exercise plan. We discussed treatment options and she was placed on an appetite suppressant medication phentermine 37.5 mg 1 by mouth daily which she started 3 weeks ago is here for follow-up. She is tolerating medication well no shortness of breath no palpitations or chest and no swelling in the lower extremities.  Patient had a formal comprehensive metabolic panel, vitamin D, CBC, TSH and lipid profile on 05/04/2017 which was normal  Exam: Gen. appearance well-developed C Melin no acute distress Weight today 247 pounds BMI 36.80 blood pressure 142/80 Lungs: Clear to auscultation rhonchi's or wheezes Heart: Regular rate and rhythm no murmurs or gallops Neck: Trachea midline, supple, no carotid bruits Extremities: No cords no edema  Assessment/plan: Morbidly obese patient only 3 weeks on phentermine 37.5 mg daily was encouraged to follow-up with the recommended appointment with a nutritionist. She was given instructions on regular exercise plan. The risks benefits and pros and cons of the medication were once again discussed include pulmonary hypertension and cardiac valvular disease. She was given a prescription for 2 more refills and she will return back in 3 months for follow-up. She is scheduled soon for overdue colonoscopy as well as her bone density.  Greater than 90% of time was spent counseling and coordinating care for this patient. Time of consultation 15 minutes

## 2017-05-25 NOTE — Patient Instructions (Signed)
Phentermine sustained-release capsules What is this medicine? PHENTERMINE (FEN ter meen) decreases your appetite. It is used with a reduced calorie diet and exercise to help you lose weight. This medicine may be used for other purposes; ask your health care provider or pharmacist if you have questions. COMMON BRAND NAME(S): Ionamin, Pro-Fast What should I tell my health care provider before I take this medicine? They need to know if you have any of these conditions: -agitation -glaucoma -heart disease -high blood pressure -history of substance abuse -lung disease called Primary Pulmonary Hypertension (PPH) -taken an MAOI like Carbex, Eldepryl, Marplan, Nardil, or Parnate in last 14 days -thyroid disease -an unusual or allergic reaction to phentermine, other medicines, foods, dyes, or preservatives -pregnant or trying to get pregnant -breast-feeding How should I use this medicine? Take this medicine by mouth with a glass of water. Follow the directions on the prescription label. This medicine is usually taken before breakfast or at least 10 to 14 hours before going to bed. Avoid taking this medicine in the evening. It may interfere with sleep. Swallow whole. Do not open or chew the capsules. Take your doses at regular intervals. Do not take your medicine more often than directed. Talk to your pediatrician regarding the use of this medicine in children. Special care may be needed. Overdosage: If you think you have taken too much of this medicine contact a poison control center or emergency room at once. NOTE: This medicine is only for you. Do not share this medicine with others. What if I miss a dose? If you miss a dose, take it as soon as you can. If it is almost time for your next dose, take only that dose. Do not take double or extra doses. What may interact with this medicine? Do not take this medicine with any of the following medications: -duloxetine -MAOIs like Carbex, Eldepryl,  Marplan, Nardil, and Parnate -medicines for colds or breathing difficulties like pseudoephedrine or phenylephrine -procarbazine -sibutramine -SSRIs like citalopram, escitalopram, fluoxetine, fluvoxamine, paroxetine, and sertraline -stimulants like dexmethylphenidate, methylphenidate or modafinil -venlafaxine This medicine may also interact with the following medications: -medicines for diabetes This list may not describe all possible interactions. Give your health care provider a list of all the medicines, herbs, non-prescription drugs, or dietary supplements you use. Also tell them if you smoke, drink alcohol, or use illegal drugs. Some items may interact with your medicine. What should I watch for while using this medicine? Notify your physician immediately if you become short of breath while doing your normal activities. Do not take this medicine within 6 hours of bedtime. It can keep you from getting to sleep. Avoid drinks that contain caffeine and try to stick to a regular bedtime every night. This medicine was intended to be used in addition to a healthy diet and exercise. The best results are achieved this way. This medicine is only indicated for short-term use. Eventually your weight loss may level out. At that point, the drug will only help you maintain your new weight. Do not increase or in any way change your dose without consulting your doctor. You may get drowsy or dizzy. Do not drive, use machinery, or do anything that needs mental alertness until you know how this medicine affects you. Do not stand or sit up quickly, especially if you are an older patient. This reduces the risk of dizzy or fainting spells. Alcohol may increase dizziness and drowsiness. Avoid alcoholic drinks. What side effects may I notice from receiving this   medicine? Side effects that you should report to your doctor or health care professional as soon as possible: -chest pain, palpitations -depression or severe  changes in mood -increased blood pressure -irritability -nervousness or restlessness -severe dizziness -shortness of breath -problems urinating -unusual swelling of the legs -vomiting Side effects that usually do not require medical attention (report to your doctor or health care professional if they continue or are bothersome): -blurred vision or other eye problems -changes in sexual ability or desire -constipation or diarrhea -difficulty sleeping -dry mouth or unpleasant taste -headache -nausea This list may not describe all possible side effects. Call your doctor for medical advice about side effects. You may report side effects to FDA at 1-800-FDA-1088. Where should I keep my medicine? Keep out of the reach of children. This medicine can be abused. Keep your medicine in a safe place to protect it from theft. Do not share this medicine with anyone. Selling or giving away this medicine is dangerous and against the law. This medicine may cause accidental overdose and death if taken by other adults, children, or pets. Mix any unused medicine with a substance like cat litter or coffee grounds. Then throw the medicine away in a sealed container like a sealed bag or a coffee can with a lid. Do not use the medicine after the expiration date. Store at room temperature between 20 and 25 degrees C (68 and 77 degrees F). Keep container tightly closed. NOTE: This sheet is a summary. It may not cover all possible information. If you have questions about this medicine, talk to your doctor, pharmacist, or health care provider.  2018 Elsevier/Gold Standard (2014-07-21 16:19:17)  

## 2017-07-06 ENCOUNTER — Encounter: Payer: Self-pay | Admitting: Registered"

## 2017-07-06 ENCOUNTER — Encounter: Payer: BC Managed Care – PPO | Attending: Gynecology | Admitting: Registered"

## 2017-07-06 DIAGNOSIS — Z713 Dietary counseling and surveillance: Secondary | ICD-10-CM | POA: Diagnosis present

## 2017-07-06 DIAGNOSIS — R635 Abnormal weight gain: Secondary | ICD-10-CM | POA: Diagnosis not present

## 2017-07-06 DIAGNOSIS — E669 Obesity, unspecified: Secondary | ICD-10-CM

## 2017-07-06 NOTE — Progress Notes (Signed)
Medical Nutrition Therapy:  Appt start time: 0805 end time:  0955.  Assessment:  Primary concerns today: Pt states she is working with Dr Lily Peer to lose weight and taking phentermine. Pt states her doctor recommended seeing an dietitian. Pt states she has lost ~10 lbs in about 7 weeks. Pt states the main thing that has changed since starting the wt loss medication is she has reduced portion sizes and often not hungry for dinner. Pt reports that her spouse encourages her to not skip meals and at least have a snack. Pt reported diet intake overall appears reasonable, with maybe some tweaks to help balance macronutrient intake and include a few more vegetables.  Pt states 20 yrs ago she lost a significant amount of weight by walking consistently and was walking with her spouse who is now disabled and not able to join her in walking. Pt report she is also limited due to work schedule of ~10 hr days.  Pt states she is not drinking as much water during the day as she would like due to limited breaks at work.  Sleep: 8 pm - 5:30-6 am Energy level: fine, mentally tired after work Stress: Pt states she has learned stress management skills   Pt states she gets 20-30 min lunch break, but works through it and eats while working. Pt states her weakness is bread.   Preferred Learning Style:   No preference indicated   Learning Readiness:   Change in progress  MEDICATIONS: reviewed (Phentermine for wt loss)   DIETARY INTAKE:  24-hr recall:  B ( AM): bagel, grapes, yogurt usually OR egg, bacon, toast OR oatmeal winter  Snk ( AM): non OR seldom will have protein packs OR fruit  L ( PM): salad, meat, 1/2 potato OR sandwich, chips OR popcorn Snk ( PM): popcorn OR protein pack OR fruit D ( PM): vegetables, sometimes chicken Snk ( PM): none OR heart healthy nuts Beverages: water, OJ, flavored water, 1/2 sweet 1/2 unsweet,   Usual physical activity: ADL  Estimated energy needs: 1600 calories 180  g carbohydrates 120 g protein 44 g fat  Progress Towards Goal(s):  In progress.   Nutritional Diagnosis:  [place]-3.3 Overweight/obesity As related to portion sizes.  As evidenced by pt reported reduced portion sizes resulting in 10 lbs weight loss in ~7 weeks.    Intervention:  Nutrition Education. Discussed balanced meals. Discussed importance of movement and regular exercise.  Plan: Consider going for a 15 min brisk walk after work to help combat the mental fatigue. Idea for getting more exercise: Low Impact High Intensity Interval Training (HIIT) Workout Carley Hammed Avoid sitting too long, aim to move some every hour. It's good that you are not skipping meals When eating oatmeal consider adding a handful of walnuts  Teaching Method Utilized: Visual Auditory  Handouts given during visit include:  Smoothie recipes  Barriers to learning/adherence to lifestyle change: long work hours, not feeling safe outside to get preferred type of exercise.  Demonstrated degree of understanding via:  Teach Back   Monitoring/Evaluation:  Dietary intake, exercise, and body weight prn.

## 2017-07-06 NOTE — Patient Instructions (Addendum)
Plan: Consider going for a 15 min brisk walk after work to help combat the mental fatigue. Idea for getting more exercise: Low Impact High Intensity Interval Training (HIIT) Workout Lauren Gray Avoid sitting too long, aim to move some every hour. It's good that you are not skipping meals When eating oatmeal consider adding a handful of walnuts

## 2017-07-16 NOTE — Progress Notes (Deleted)
Subjective:    Patient ID: Lauren Gray, female    DOB: [birth year], 66 y.o.   MRN: [id number]  07/17/2017  No chief complaint on file.   HPI This 66 y.o. female presents for evaluation of ***. BP Readings from Last 3 Encounters:  05/25/17 (!) 142/80  05/04/17 128/80  04/28/16 132/84   Wt Readings from Last 3 Encounters:  07/06/17 238 lb 12.8 oz (108.3 kg)  05/25/17 242 lb (109.8 kg)  05/04/17 249 lb (112.9 kg)    There is no immunization history on file for this patient.  Review of Systems  Constitutional: Negative for chills, diaphoresis, fatigue and fever.  Eyes: Negative for visual disturbance.  Respiratory: Negative for cough and shortness of breath.   Cardiovascular: Negative for chest pain, palpitations and leg swelling.  Gastrointestinal: Negative for abdominal pain, constipation, diarrhea, nausea and vomiting.  Endocrine: Negative for cold intolerance, heat intolerance, polydipsia, polyphagia and polyuria.  Neurological: Negative for dizziness, tremors, seizures, syncope, facial asymmetry, speech difficulty, weakness, light-headedness, numbness and headaches.    No past medical history on file. Past Surgical History:  Procedure Laterality Date  . ABDOMINAL HYSTERECTOMY     [facility]  . PELVIC LAPAROSCOPY  04/06/2006   BSO   Allergies  Allergen Reactions  . Latex   . Codeine Anxiety    "i don't feel right at all"   Current Outpatient Prescriptions  Medication Sig Dispense Refill  . Ascorbic Acid (VITAMIN C) 1000 MG tablet Take 1,000 mg by mouth daily.    . betamethasone dipropionate (DIPROLENE) 0.05 % cream Apply topically 2 (two) times daily. 30 g 3  . Calcium-Vitamin D (CALTRATE 600 PLUS-VIT D PO) Take by mouth daily.    . cholecalciferol (VITAMIN D) 1000 UNITS tablet Take 1,000 Units by mouth daily.    . Cod Liver Oil 5000-500 UNIT/5ML OIL Take by mouth.    . phentermine 37.5 MG capsule Take 1 capsule (37.5 mg total) by mouth every morning. 30 capsule 2    No current facility-administered medications for this visit.    Social History   Social History  . Marital status: Married    Spouse name: N/A  . Number of children: N/A  . Years of education: N/A   Occupational History  . Not on file.   Social History Main Topics  . Smoking status: Never Smoker  . Smokeless tobacco: Never Used  . Alcohol use 0.0 oz/week     Comment: wine  . Drug use: No  . Sexual activity: Yes    Birth control/ protection: Surgical     Comment: HYSTERECTOMY   Other Topics Concern  . Not on file   Social History Narrative  . No narrative on file   Family History  Problem Relation Age of Onset  . Cancer Mother        ENDOMETRIOD OF OVARY  . Diabetes Father   . Heart disease Father   . Breast cancer Sister 74  . Hypertension Sister   . Diabetes Paternal Grandmother        Objective:    There were no vitals taken for this visit. Physical Exam  Constitutional: She is oriented to person, place, and time. She appears well-developed and well-nourished. No distress.  HENT:  Head: Normocephalic and atraumatic.  Right Ear: External ear normal.  Left Ear: External ear normal.  Nose: Nose normal.  Mouth/Throat: Oropharynx is clear and moist.  Eyes: Pupils are equal, round, and reactive to light. Conjunctivae and EOM are  normal.  Neck: Normal range of motion. Neck supple. Carotid bruit is not present. No thyromegaly present.  Cardiovascular: Normal rate, regular rhythm, normal heart sounds and intact distal pulses.  Exam reveals no gallop and no friction rub.   No murmur heard. Pulmonary/Chest: Effort normal and breath sounds normal. She has no wheezes. She has no rales.  Abdominal: Soft. Bowel sounds are normal. She exhibits no distension and no mass. There is no tenderness. There is no rebound and no guarding.  Lymphadenopathy:    She has no cervical adenopathy.  Neurological: She is alert and oriented to person, place, and time. No cranial nerve  deficit.  Skin: Skin is warm and dry. No rash noted. She is not diaphoretic. No erythema. No pallor.  Psychiatric: She has a normal mood and affect. Her behavior is normal.   Results for orders placed or performed in visit on 05/04/17  CBC with Differential/Platelet  Result Value Ref Range   WBC 5.0 3.8 - 10.8 K/uL   RBC 4.26 3.80 - 5.10 MIL/uL   Hemoglobin 12.8 11.7 - 15.5 g/dL   HCT 39.2 35.0 - 45.0 %   MCV 92.0 80.0 - 100.0 fL   MCH 30.0 27.0 - 33.0 pg   MCHC 32.7 32.0 - 36.0 g/dL   RDW 13.8 11.0 - 15.0 %   Platelets 336 140 - 400 K/uL   MPV 10.2 7.5 - 12.5 fL   Neutro Abs 2,350 1,500 - 7,800 cells/uL   Lymphs Abs 2,100 850 - 3,900 cells/uL   Monocytes Absolute 300 200 - 950 cells/uL   Eosinophils Absolute 250 15 - 500 cells/uL   Basophils Absolute 0 0 - 200 cells/uL   Neutrophils Relative % 47 %   Lymphocytes Relative 42 %   Monocytes Relative 6 %   Eosinophils Relative 5 %   Basophils Relative 0 %   Smear Review Criteria for review not met   Comprehensive metabolic panel  Result Value Ref Range   Sodium 140 135 - 146 mmol/L   Potassium 4.8 3.5 - 5.3 mmol/L   Chloride 103 98 - 110 mmol/L   CO2 25 20 - 31 mmol/L   Glucose, Bld 92 65 - 99 mg/dL   BUN 13 7 - 25 mg/dL   Creat 1.17 (H) 0.50 - 0.99 mg/dL   Total Bilirubin 0.5 0.2 - 1.2 mg/dL   Alkaline Phosphatase 75 33 - 130 U/L   AST 15 10 - 35 U/L   ALT 14 6 - 29 U/L   Total Protein 7.2 6.1 - 8.1 g/dL   Albumin 3.9 3.6 - 5.1 g/dL   Calcium 9.4 8.6 - 10.4 mg/dL  Lipid panel  Result Value Ref Range   Cholesterol 219 (H) <200 mg/dL   Triglycerides 125 <150 mg/dL   HDL 60 >50 mg/dL   Total CHOL/HDL Ratio 3.7 <5.0 Ratio   VLDL 25 <30 mg/dL   LDL Cholesterol 134 (H) <100 mg/dL  VITAMIN D 25 Hydroxy (Vit-D Deficiency, Fractures)  Result Value Ref Range   Vit D, 25-Hydroxy 31 30 - 100 ng/mL  Urinalysis w microscopic + reflex cultur  Result Value Ref Range   Color, Urine YELLOW YELLOW   APPearance CLEAR CLEAR    Specific Gravity, Urine 1.016 1.001 - 1.035   pH 7.0 5.0 - 8.0   Glucose, UA NEGATIVE NEGATIVE   Bilirubin Urine NEGATIVE NEGATIVE   Ketones, ur NEGATIVE NEGATIVE   Hgb urine dipstick NEGATIVE NEGATIVE   Protein, ur NEGATIVE NEGATIVE  Nitrite NEGATIVE NEGATIVE   Leukocytes, UA NEGATIVE NEGATIVE   WBC, UA NONE SEEN <=5 WBC/HPF   RBC / HPF NONE SEEN <=2 RBC/HPF   Squamous Epithelial / LPF NONE SEEN <=5 HPF   Bacteria, UA NONE SEEN NONE SEEN HPF   Crystals NONE SEEN NONE SEEN HPF   Casts NONE SEEN NONE SEEN LPF   Yeast NONE SEEN NONE SEEN HPF  TSH  Result Value Ref Range   TSH 1.49 mIU/L   No results found. Depression screen PHQ 2/9 07/06/2017  Decreased Interest 0  Down, Depressed, Hopeless 0  PHQ - 2 Score 0   Fall Risk  07/06/2017  Falls in the past year? No        Assessment & Plan:  No diagnosis found.  No orders of the defined types were placed in this encounter.  No orders of the defined types were placed in this encounter.   No Follow-up on file.   Kristi Elayne Guerin, M.D. Primary Care at [facility] previously [facility] [place] [address] [place], [place]  [address] [phone] phone [phone] fax

## 2017-07-17 ENCOUNTER — Ambulatory Visit: Payer: BC Managed Care – PPO | Admitting: Family Medicine

## 2017-07-23 ENCOUNTER — Ambulatory Visit: Payer: BC Managed Care – PPO | Admitting: Obstetrics & Gynecology

## 2017-08-06 ENCOUNTER — Encounter: Payer: Self-pay | Admitting: Obstetrics & Gynecology

## 2017-08-06 ENCOUNTER — Other Ambulatory Visit: Payer: Self-pay | Admitting: *Deleted

## 2017-08-06 ENCOUNTER — Ambulatory Visit (INDEPENDENT_AMBULATORY_CARE_PROVIDER_SITE_OTHER): Payer: BC Managed Care – PPO | Admitting: Obstetrics & Gynecology

## 2017-08-06 VITALS — BP 132/90 | Wt 238.0 lb

## 2017-08-06 DIAGNOSIS — Z6835 Body mass index (BMI) 35.0-35.9, adult: Secondary | ICD-10-CM | POA: Diagnosis not present

## 2017-08-06 DIAGNOSIS — Z780 Asymptomatic menopausal state: Secondary | ICD-10-CM

## 2017-08-06 DIAGNOSIS — E6609 Other obesity due to excess calories: Secondary | ICD-10-CM | POA: Diagnosis not present

## 2017-08-06 NOTE — Progress Notes (Signed)
    Lauren Gray 12/27/1950 [id number]        66 y.o.  G1P1001   RP:  Weight loss management  HPI:  On Phentermine.  Lost 4 Lbs x last visit 2 1/2 month ago.    Past medical history,surgical history, problem list, medications, allergies, family history and social history were all reviewed and documented in the EPIC chart.  Directed ROS with pertinent positives and negatives documented in the history of present illness/assessment and plan.  Exam:  Vitals:   08/06/17 1014  BP: (!) 152/92  Weight: 238 lb (108 kg)  BMI 35.66  BP rechecked at 132/90  General appearance:  Normal  Deferred  Assessment/Plan:  65 y.o. G1P1001   1. Class 2 obesity due to excess calories without serious comorbidity with body mass index (BMI) of 35.0 to 35.9 in adult Successfully adhering to a low calorie/low carb diet and regular physical activity program.  Modest but significant weight loss x last visit.  HTN, second BP borderline.  Low salt diet recommended.  Patient comfortable to continue with same diet/physical activity without Phentermine at this time.  Counseling on above issues >50% x 15 minutes.     Genia Del MD, 10:47 AM 08/06/2017

## 2017-08-10 NOTE — Patient Instructions (Signed)
1. Class 2 obesity due to excess calories without serious comorbidity with body mass index (BMI) of 35.0 to 35.9 in adult Successfully adhering to a low calorie/low carb diet and regular physical activity program.  Modest but significant weight loss x last visit.  HTN, second BP borderline.  Low salt diet recommended.  Patient comfortable to continue with same diet/physical activity without Phentermine at this time.  Lauren Gray, good to see you today!  You are doing very well with your diet and physical activity.  Keep it on!   Exercising to Lose Weight Exercising can help you to lose weight. In order to lose weight through exercise, you need to do vigorous-intensity exercise. You can tell that you are exercising with vigorous intensity if you are breathing very hard and fast and cannot hold a conversation while exercising. Moderate-intensity exercise helps to maintain your current weight. You can tell that you are exercising at a moderate level if you have a higher heart rate and faster breathing, but you are still able to hold a conversation. How often should I exercise? Choose an activity that you enjoy and set realistic goals. Your health care provider can help you to make an activity plan that works for you. Exercise regularly as directed by your health care provider. This may include:  Doing resistance training twice each week, such as: ? Push-ups. ? Sit-ups. ? Lifting weights. ? Using resistance bands.  Doing a given intensity of exercise for a given amount of time. Choose from these options: ? 150 minutes of moderate-intensity exercise every week. ? 75 minutes of vigorous-intensity exercise every week. ? A mix of moderate-intensity and vigorous-intensity exercise every week.  Children, pregnant women, people who are out of shape, people who are overweight, and older adults may need to consult a health care provider for individual recommendations. If you have any sort of medical condition,  be sure to consult your health care provider before starting a new exercise program. What are some activities that can help me to lose weight?  Walking at a rate of at least 4.5 miles an hour.  Jogging or running at a rate of 5 miles per hour.  Biking at a rate of at least 10 miles per hour.  Lap swimming.  Roller-skating or in-line skating.  Cross-country skiing.  Vigorous competitive sports, such as football, basketball, and soccer.  Jumping rope.  Aerobic dancing. How can I be more active in my day-to-day activities?  Use the stairs instead of the elevator.  Take a walk during your lunch break.  If you drive, park your car farther away from work or school.  If you take public transportation, get off one stop early and walk the rest of the way.  Make all of your phone calls while standing up and walking around.  Get up, stretch, and walk around every 30 minutes throughout the day. What guidelines should I follow while exercising?  Do not exercise so much that you hurt yourself, feel dizzy, or get very short of breath.  Consult your health care provider prior to starting a new exercise program.  Wear comfortable clothes and shoes with good support.  Drink plenty of water while you exercise to prevent dehydration or heat stroke. Body water is lost during exercise and must be replaced.  Work out until you breathe faster and your heart beats faster. This information is not intended to replace advice given to you by your health care provider. Make sure you discuss any questions you  have with your health care provider. Document Released: 12/02/2010 Document Revised: 04/06/2016 Document Reviewed: 04/02/2014 Elsevier Interactive Patient Education  2018 [employer].  Calorie Counting for [employer] Calories are units of energy. Your body needs a certain amount of calories from food to keep you going throughout the day. When you eat more calories than your body needs, your  body stores the extra calories as fat. When you eat fewer calories than your body needs, your body burns fat to get the energy it needs. Calorie counting means keeping track of how many calories you eat and drink each day. Calorie counting can be helpful if you need to lose weight. If you make sure to eat fewer calories than your body needs, you should lose weight. Ask your health care provider what a healthy weight is for you. For calorie counting to work, you will need to eat the right number of calories in a day in order to lose a healthy amount of weight per week. A dietitian can help you determine how many calories you need in a day and will give you suggestions on how to reach your calorie goal.  A healthy amount of weight to lose per week is usually 1-2 lb (0.5-0.9 kg). This usually means that your daily calorie intake should be reduced by 500-750 calories.  Eating 1,200 - 1,500 calories per day can help most women lose weight.  Eating 1,500 - 1,800 calories per day can help most men lose weight.  What is my plan? My goal is to have __________ calories per day. If I have this many calories per day, I should lose around __________ pounds per week. What do I need to know about calorie counting? In order to meet your daily calorie goal, you will need to:  Find out how many calories are in each food you would like to eat. Try to do this before you eat.  Decide how much of the food you plan to eat.  Write down what you ate and how many calories it had. Doing this is called keeping a food log.  To successfully lose weight, it is important to balance calorie counting with a healthy lifestyle that includes regular activity. Aim for 150 minutes of moderate exercise (such as walking) or 75 minutes of vigorous exercise (such as running) each week. Where do I find calorie information?  The number of calories in a food can be found on a Nutrition Facts label. If a food does not have a Nutrition  Facts label, try to look up the calories online or ask your dietitian for help. Remember that calories are listed per serving. If you choose to have more than one serving of a food, you will have to multiply the calories per serving by the amount of servings you plan to eat. For example, the label on a package of bread might say that a serving size is 1 slice and that there are 90 calories in a serving. If you eat 1 slice, you will have eaten 90 calories. If you eat 2 slices, you will have eaten 180 calories. How do I keep a food log? Immediately after each meal, record the following information in your food log:  What you ate. Don't forget to include toppings, sauces, and other extras on the food.  How much you ate. This can be measured in cups, ounces, or number of items.  How many calories each food and drink had.  The total number of calories in the  meal.  Keep your food log near you, such as in a small notebook in your pocket, or use a mobile app or website. Some programs will calculate calories for you and show you how many calories you have left for the day to meet your goal. What are some calorie counting tips?  Use your calories on foods and drinks that will fill you up and not leave you hungry: ? Some examples of foods that fill you up are nuts and nut butters, vegetables, lean proteins, and high-fiber foods like whole grains. High-fiber foods are foods with more than 5 g fiber per serving. ? Drinks such as sodas, specialty coffee drinks, alcohol, and juices have a lot of calories, yet do not fill you up.  Eat nutritious foods and avoid empty calories. Empty calories are calories you get from foods or beverages that do not have many vitamins or protein, such as candy, sweets, and soda. It is better to have a nutritious high-calorie food (such as an avocado) than a food with few nutrients (such as a bag of chips).  Know how many calories are in the foods you eat most often. This will  help you calculate calorie counts faster.  Pay attention to calories in drinks. Low-calorie drinks include water and unsweetened drinks.  Pay attention to nutrition labels for "low fat" or "fat free" foods. These foods sometimes have the same amount of calories or more calories than the full fat versions. They also often have added sugar, starch, or salt, to make up for flavor that was removed with the fat.  Find a way of tracking calories that works for you. Get creative. Try different apps or programs if writing down calories does not work for you. What are some portion control tips?  Know how many calories are in a serving. This will help you know how many servings of a certain food you can have.  Use a measuring cup to measure serving sizes. You could also try weighing out portions on a kitchen scale. With time, you will be able to estimate serving sizes for some foods.  Take some time to put servings of different foods on your favorite plates, bowls, and cups so you know what a serving looks like.  Try not to eat straight from a bag or box. Doing this can lead to overeating. Put the amount you would like to eat in a cup or on a plate to make sure you are eating the right portion.  Use smaller plates, glasses, and bowls to prevent overeating.  Try not to multitask (for example, watch TV or use your computer) while eating. If it is time to eat, sit down at a table and enjoy your food. This will help you to know when you are full. It will also help you to be aware of what you are eating and how much you are eating. What are tips for following this plan? Reading food labels  Check the calorie count compared to the serving size. The serving size may be smaller than what you are used to eating.  Check the source of the calories. Make sure the food you are eating is high in vitamins and protein and low in saturated and trans fats. Shopping  Read nutrition labels while you shop. This will  help you make healthy decisions before you decide to purchase your food.  Make a grocery list and stick to it. Cooking  Try to cook your favorite foods in a healthier way.  For example, try baking instead of frying.  Use low-fat dairy products. Meal planning  Use more fruits and vegetables. Half of your plate should be fruits and vegetables.  Include lean proteins like poultry and fish. How do I count calories when eating out?  Ask for smaller portion sizes.  Consider sharing an entree and sides instead of getting your own entree.  If you get your own entree, eat only half. Ask for a box at the beginning of your meal and put the rest of your entree in it so you are not tempted to eat it.  If calories are listed on the menu, choose the lower calorie options.  Choose dishes that include vegetables, fruits, whole grains, low-fat dairy products, and lean protein.  Choose items that are boiled, broiled, grilled, or steamed. Stay away from items that are buttered, battered, fried, or served with cream sauce. Items labeled "crispy" are usually fried, unless stated otherwise.  Choose water, low-fat milk, unsweetened iced tea, or other drinks without added sugar. If you want an alcoholic beverage, choose a lower calorie option such as a glass of wine or light beer.  Ask for dressings, sauces, and syrups on the side. These are usually high in calories, so you should limit the amount you eat.  If you want a salad, choose a garden salad and ask for grilled meats. Avoid extra toppings like bacon, cheese, or fried items. Ask for the dressing on the side, or ask for olive oil and vinegar or lemon to use as dressing.  Estimate how many servings of a food you are given. For example, a serving of cooked rice is  cup or about the size of half a baseball. Knowing serving sizes will help you be aware of how much food you are eating at restaurants. The list below tells you how big or small some common  portion sizes are based on everyday objects: ? 1 oz-4 stacked dice. ? 3 oz-1 deck of cards. ? 1 tsp-1 die. ? 1 Tbsp- a ping-pong ball. ? 2 Tbsp-1 ping-pong ball. ?  cup- baseball. ? 1 cup-1 baseball. Summary  Calorie counting means keeping track of how many calories you eat and drink each day. If you eat fewer calories than your body needs, you should lose weight.  A healthy amount of weight to lose per week is usually 1-2 lb (0.5-0.9 kg). This usually means reducing your daily calorie intake by 500-750 calories.  The number of calories in a food can be found on a Nutrition Facts label. If a food does not have a Nutrition Facts label, try to look up the calories online or ask your dietitian for help.  Use your calories on foods and drinks that will fill you up, and not on foods and drinks that will leave you hungry.  Use smaller plates, glasses, and bowls to prevent overeating. This information is not intended to replace advice given to you by your health care provider. Make sure you discuss any questions you have with your health care provider. Document Released: 10/30/2005 Document Revised: 09/29/2016 Document Reviewed: 09/29/2016 Elsevier Interactive Patient Education  2017 [employer].

## 2017-09-06 ENCOUNTER — Other Ambulatory Visit: Payer: Self-pay | Admitting: Obstetrics & Gynecology

## 2017-09-06 ENCOUNTER — Other Ambulatory Visit: Payer: Self-pay | Admitting: Gynecology

## 2017-09-06 DIAGNOSIS — Z13820 Encounter for screening for osteoporosis: Secondary | ICD-10-CM

## 2017-11-15 ENCOUNTER — Telehealth: Payer: Self-pay | Admitting: *Deleted

## 2017-11-15 NOTE — Telephone Encounter (Signed)
Caryn BeeKevin pharmacist called stating patient is requesting refill on phentermine 37.5 mg capsules. Per note on 08/06/17 "Successfully adhering to a low calorie/low carb diet and regular physical activity program.  Modest but significant weight loss x last visit.  HTN, second BP borderline.  Low salt diet recommended.  Patient comfortable to continue with same diet/physical activity without Phentermine at this time"  I called and denied refill on Rx due to the above.

## 2018-01-31 ENCOUNTER — Other Ambulatory Visit: Payer: Self-pay | Admitting: Obstetrics & Gynecology

## 2018-01-31 DIAGNOSIS — Z139 Encounter for screening, unspecified: Secondary | ICD-10-CM

## 2018-02-25 ENCOUNTER — Ambulatory Visit
Admission: RE | Admit: 2018-02-25 | Discharge: 2018-02-25 | Disposition: A | Discharge status: Home / Self Care | Payer: BC Managed Care – PPO | Source: Ambulatory Visit | Attending: Obstetrics & Gynecology | Admitting: Obstetrics & Gynecology

## 2018-02-25 ENCOUNTER — Ambulatory Visit (INDEPENDENT_AMBULATORY_CARE_PROVIDER_SITE_OTHER): Payer: BC Managed Care – PPO

## 2018-02-25 ENCOUNTER — Ambulatory Visit: Payer: BC Managed Care – PPO | Admitting: Podiatry

## 2018-02-25 ENCOUNTER — Encounter: Payer: Self-pay | Admitting: Podiatry

## 2018-02-25 ENCOUNTER — Telehealth: Payer: Self-pay | Admitting: Podiatry

## 2018-02-25 DIAGNOSIS — M205X9 Other deformities of toe(s) (acquired), unspecified foot: Secondary | ICD-10-CM

## 2018-02-25 DIAGNOSIS — M2141 Flat foot [pes planus] (acquired), right foot: Secondary | ICD-10-CM | POA: Diagnosis not present

## 2018-02-25 DIAGNOSIS — M21619 Bunion of unspecified foot: Secondary | ICD-10-CM

## 2018-02-25 DIAGNOSIS — M2142 Flat foot [pes planus] (acquired), left foot: Secondary | ICD-10-CM | POA: Diagnosis not present

## 2018-02-25 DIAGNOSIS — Z139 Encounter for screening, unspecified: Secondary | ICD-10-CM

## 2018-02-25 DIAGNOSIS — M779 Enthesopathy, unspecified: Secondary | ICD-10-CM | POA: Diagnosis not present

## 2018-02-25 NOTE — Patient Instructions (Addendum)
You can use a moisturizer with urea in it for your heels.    Bunion A bunion is a bump on the base of the big toe that forms when the bones of the big toe joint move out of position. Bunions may be small at first, but they often get larger over time. The can make walking painful. What are the causes? A bunion may be caused by:  Wearing narrow or pointed shoes that force the big toe to press against the other toes.  Abnormal foot development that causes the foot to roll inward (pronate).  Changes in the foot that are caused by certain diseases, such as rheumatoid arthritis and polio.  A foot injury.  What increases the risk? The following factors may make you more likely to develop this condition:  Wearing shoes that squeeze the toes together.  Having certain diseases, such as: ? Rheumatoid arthritis. ? Polio. ? Cerebral palsy.  Having family members who have bunions.  Being born with a foot deformity, such as flat feet or low arches.  Doing activities that put a lot of pressure on the feet, such as ballet dancing.  What are the signs or symptoms? The main symptom of a bunion is a noticeable bump on the big toe. Other symptoms may include:  Pain.  Swelling around the big toe.  Redness and inflammation.  Thick or hardened skin on the big toe or between the toes.  Stiffness or loss of motion in the big toe.  Trouble with walking.  How is this diagnosed? A bunion may be diagnosed based on your symptoms, medical history, and activities. You may have tests, such as:  X-rays. These allow your health care provider to check the position of the bones in your foot and look for damage to your joint. They also help your health care provider to determine the severity of your bunion and the best way to treat it.  Joint aspiration. In this test, a sample of fluid is removed from the toe joint. This test, which may be done if you are in a lot of pain, helps to rule out diseases  that cause painful swelling of the joints, such as arthritis.  How is this treated? There is no cure for a bunion, but treatment can help to prevent a bunion from getting worse. Treatment depends on the severity of your symptoms. Your health care provider may recommend:  Wearing shoes that have a wide toe box.  Using bunion pads to cushion the affected area.  Taping your toes together to keep them in a normal position.  Placing a device inside your shoe (orthotics) to help reduce pressure on your toe joint.  Taking medicine to ease pain, inflammation, and swelling.  Applying heat or ice to the affected area.  Doing stretching exercises.  Surgery to remove scar tissue and move the toes back into their normal position. This treatment is rare.  Follow these instructions at home:  Support your toe joint with proper footwear, shoe padding, or taping as told by your health care provider.  Take over-the-counter and prescription medicines only as told by your health care provider.  If directed, apply ice to the injured area: ? Put ice in a plastic bag. ? Place a towel between your skin and the bag. ? Leave the ice on for 20 minutes, 2-3 times per day.  If directed, apply heat to the affected area before you exercise. Use the heat source that your health care provider recommends, such  as a moist heat pack or a heating pad. ? Place a towel between your skin and the heat source. ? Leave the heat on for 20-30 minutes. ? Remove the heat if your skin turns bright red. This is especially important if you are unable to feel pain, heat, or cold. You may have a greater risk of getting burned.  Do exercises as told by your health care provider.  Keep all follow-up visits as told by your health care provider. Contact a health care provider if:  Your symptoms get worse.  Your symptoms do not improve in 2 weeks. Get help right away if:  You have severe pain and trouble with walking. This  information is not intended to replace advice given to you by your health care provider. Make sure you discuss any questions you have with your health care provider. Document Released: 10/30/2005 Document Revised: 04/06/2016 Document Reviewed: 05/30/2015 Elsevier Interactive Patient Education  [employer]2018 [employer]

## 2018-02-25 NOTE — Progress Notes (Unsigned)
   Subjective:    Patient ID: Lauren Gray, female    DOB: 09/16/1951, 67 y.o.   MRN: [id number]  HPI    Review of Systems  All other systems reviewed and are negative.      Objective:   Physical Exam        Assessment & Plan:

## 2018-02-25 NOTE — Telephone Encounter (Signed)
I saw Dr. Ardelle AntonWagoner this morning at 10:00 am and he was going to give me the name of a medication I could buy over the counter for my feet. He did not give that to me. If he could call and leave a message at [phone]. Thank you.

## 2018-02-25 NOTE — Progress Notes (Signed)
Scanned/cast for [facility] to address FF varus, reverse morton's w/ good long. Arch support Dr. Dannial MonarchWag to drop charges.

## 2018-02-25 NOTE — Progress Notes (Signed)
Subjective:    Patient ID: Lauren Gray, female    DOB: 01/02/1951, 67 y.o.   MRN: [id number]  HPI  67 year old female presents the office today for concerns of her arch is going as well as for bunions on both of her feet.  They have been ongoing for quite some time.  She is previously been seen by Dr. Ralene CorkSikora and was told to wait until the bunions were more painful or severe before pursuing surgery.  She states that she will become more painful.  She states that they hurt inside shoes.  She has tried changing shoes without any significant improvement.  She is also concerned about her fifth toes curling inwards.  Occasionally has some pain in the arch of the foot.  No other concerns today.  No recent injury.  Review of Systems  All other systems reviewed and are negative.  History reviewed. No pertinent past medical history.  Past Surgical History:  Procedure Laterality Date  . ABDOMINAL HYSTERECTOMY     TVH  . PELVIC LAPAROSCOPY  04/06/2006   BSO     Current Outpatient Medications:  .  Ascorbic Acid (VITAMIN C) 1000 MG tablet, Take 1,000 mg by mouth daily., Disp: , Rfl:  .  betamethasone dipropionate (DIPROLENE) 0.05 % cream, Apply topically 2 (two) times daily., Disp: 30 g, Rfl: 3 .  Calcium-Vitamin D (CALTRATE 600 PLUS-VIT D PO), Take by mouth daily., Disp: , Rfl:  .  cholecalciferol (VITAMIN D) 1000 UNITS tablet, Take 1,000 Units by mouth daily., Disp: , Rfl:  .  Cod Liver Oil 5000-500 UNIT/5ML OIL, Take by mouth., Disp: , Rfl:  .  phentermine 37.5 MG capsule, Take 1 capsule (37.5 mg total) by mouth every morning., Disp: 30 capsule, Rfl: 2  Allergies  Allergen Reactions  . Latex   . Codeine Anxiety    "i don't feel right at all"    Social History   Socioeconomic History  . Marital status: Married    Spouse name: Not on file  . Number of children: Not on file  . Years of education: Not on file  . Highest education level: Not on file  Occupational History  . Not on  file  Social Needs  . Financial resource strain: Not on file  . Food insecurity:    Worry: Not on file    Inability: Not on file  . Transportation needs:    Medical: Not on file    Non-medical: Not on file  Tobacco Use  . Smoking status: Never Smoker  . Smokeless tobacco: Never Used  Substance and Sexual Activity  . Alcohol use: Yes    Alcohol/week: 0.0 oz    Comment: wine  . Drug use: No  . Sexual activity: Yes    Birth control/protection: Surgical    Comment: HYSTERECTOMY  Lifestyle  . Physical activity:    Days per week: Not on file    Minutes per session: Not on file  . Stress: Not on file  Relationships  . Social connections:    Talks on phone: Not on file    Gets together: Not on file    Attends religious service: Not on file    Active member of club or organization: Not on file    Attends meetings of clubs or organizations: Not on file    Relationship status: Not on file  . Intimate partner violence:    Fear of current or ex partner: Not on file    Emotionally abused:  Not on file    Physically abused: Not on file    Forced sexual activity: Not on file  Other Topics Concern  . Not on file  Social History Narrative  . Not on file        Objective:   Physical Exam General: AAO x3, NAD  Dermatological: Skin is warm, dry and supple bilateral. Nails x 10 are well manicured; remaining integument appears unremarkable at this time. There are no open sores, no preulcerative lesions, no rash or signs of infection present.  Vascular: Dorsalis Pedis artery and Posterior Tibial artery pedal pulses are 2/4 bilateral with immedate capillary fill time.  There is no pain with calf compression, swelling, warmth, erythema.   Neruologic: Grossly intact via light touch bilateral. Protective threshold with Semmes Wienstein monofilament intact to all pedal sites bilateral.   Musculoskeletal: There is a decrease in medial arch height upon weightbearing.  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  No pain or crepitation with MPJ range of motion.  No first ray hypomobility is present.  Adductovarus is present.  At this time there is no tenderness on the arch of the foot but occasionally she will get some tenderness at this area as well.  Ankle, subtalar joint range of motion intact.  Muscular strength 5/5 in all groups tested bilateral.  Gait: Unassisted, Nonantalgic.     Assessment & Plan:  67 year old female bilateral bunion deformity, flatfoot deformity/tendonitis, adductovarus of the toes. -Treatment options discussed including all alternatives, risks, and complications -Etiology of symptoms were discussed -X-rays were obtained and reviewed.  HAV is present.  Adductovarus is present in the toes.  No evidence of acute fracture or stress fracture identified today.  I did show her the x-rays today. -Her main concern with the bunion deformity.  We discussed both conservative as well as surgical treatment options.  Most likely she will benefit from an [facility] with screw fixation.  Discussed the surgery as well as postoperative course.  She will consider this in the future and will likely do this towards the end of the year if she proceeds of surgery.  She is also concerned that her flatfoot in the office following.  We discussed a more custom molded orthotic to help with this, so this will hopefully help with some pressure of the bunion deformity.  She wished to proceed with this today.  She was seen by Raiford Noble for molding.  Discussed the bunion as well as the flatfoot as well as the toe contracture is not a change but hopefully will not will help slow the progression.  Also discussed the change in shoes and offloading pads.   -Follow-up in 3 weeks to pick up orthotics with Garner Gavel DPM

## 2018-02-26 ENCOUNTER — Ambulatory Visit: Payer: BC Managed Care – PPO | Admitting: Podiatry

## 2018-03-25 ENCOUNTER — Other Ambulatory Visit: Payer: BC Managed Care – PPO | Admitting: Orthotics

## 2018-04-04 ENCOUNTER — Ambulatory Visit: Payer: BC Managed Care – PPO | Admitting: Orthotics

## 2018-04-04 DIAGNOSIS — M2141 Flat foot [pes planus] (acquired), right foot: Secondary | ICD-10-CM

## 2018-04-04 DIAGNOSIS — M779 Enthesopathy, unspecified: Secondary | ICD-10-CM

## 2018-04-04 DIAGNOSIS — M2142 Flat foot [pes planus] (acquired), left foot: Principal | ICD-10-CM

## 2018-04-04 DIAGNOSIS — M21619 Bunion of unspecified foot: Secondary | ICD-10-CM

## 2018-04-04 NOTE — Progress Notes (Signed)
Patient came in today to pick up custom made foot orthotics.  The goals were accomplished and the patient reported no dissatisfaction with said orthotics.  Patient was advised of breakin period and how to report any issues. 

## 2018-04-09 ENCOUNTER — Encounter: Payer: Self-pay | Admitting: Family Medicine

## 2018-06-21 ENCOUNTER — Encounter: Payer: BC Managed Care – PPO | Admitting: Obstetrics & Gynecology

## 2018-10-01 ENCOUNTER — Ambulatory Visit (INDEPENDENT_AMBULATORY_CARE_PROVIDER_SITE_OTHER): Payer: BC Managed Care – PPO | Admitting: Obstetrics & Gynecology

## 2018-10-01 ENCOUNTER — Encounter: Payer: Self-pay | Admitting: Obstetrics & Gynecology

## 2018-10-01 VITALS — BP 132/86 | Ht 68.0 in | Wt 250.0 lb

## 2018-10-01 DIAGNOSIS — E6609 Other obesity due to excess calories: Secondary | ICD-10-CM

## 2018-10-01 DIAGNOSIS — Z9079 Acquired absence of other genital organ(s): Secondary | ICD-10-CM

## 2018-10-01 DIAGNOSIS — Z780 Asymptomatic menopausal state: Secondary | ICD-10-CM

## 2018-10-01 DIAGNOSIS — Z90722 Acquired absence of ovaries, bilateral: Secondary | ICD-10-CM

## 2018-10-01 DIAGNOSIS — Z6838 Body mass index (BMI) 38.0-38.9, adult: Secondary | ICD-10-CM | POA: Diagnosis not present

## 2018-10-01 DIAGNOSIS — Z01419 Encounter for gynecological examination (general) (routine) without abnormal findings: Secondary | ICD-10-CM | POA: Diagnosis not present

## 2018-10-01 DIAGNOSIS — Z90710 Acquired absence of both cervix and uterus: Secondary | ICD-10-CM | POA: Diagnosis not present

## 2018-10-01 MED ORDER — PHENTERMINE HCL 37.5 MG PO TABS: 37.5000 mg | ORAL_TABLET | Freq: Every day | ORAL | 2 refills | Status: DC

## 2018-10-01 NOTE — Progress Notes (Signed)
Lauren Gray August 26, 1951 [id number]   History:    67 y.o. G1P1L1 Married.  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  S/P Total Hysterectomy, then BSO.  No pelvic pain.  No pain with IC.  Urine/BMs wnl.  Breasts normal.  BMI increasing, now 38.01.  No time to exercise.  Good nutrition per patient.  Fasting health labs here today.  Past medical history,surgical history, family history and social history were all reviewed and documented in the EPIC chart.  Gynecologic History No LMP recorded. Patient has had a hysterectomy. Contraception: status post hysterectomy Last Pap: 11/2010. Results were: Negative Last mammogram: 02/2018. Results were: Negative Bone Density: 03/2015 Normal.  Repeat at 5 yrs Colonoscopy: 02/2012  Obstetric History OB History  Gravida Para Term Preterm AB Living  1 1 1     1   SAB TAB Ectopic Multiple Live Births          1    # Outcome Date GA Lbr Len/2nd Weight Sex Delivery Anes PTL Lv  1 Term     F Vag-Spont  N LIV     ROS: A ROS was performed and pertinent positives and negatives are included in the history.  GENERAL: No fevers or chills. HEENT: No change in vision, no earache, sore throat or sinus congestion. NECK: No pain or stiffness. CARDIOVASCULAR: No chest pain or pressure. No palpitations. PULMONARY: No shortness of breath, cough or wheeze. GASTROINTESTINAL: No abdominal pain, nausea, vomiting or diarrhea, melena or bright red blood per rectum. GENITOURINARY: No urinary frequency, urgency, hesitancy or dysuria. MUSCULOSKELETAL: No joint or muscle pain, no back pain, no recent trauma. DERMATOLOGIC: No rash, no itching, no lesions. ENDOCRINE: No polyuria, polydipsia, no heat or cold intolerance. No recent change in weight. HEMATOLOGICAL: No anemia or easy bruising or bleeding. NEUROLOGIC: No headache, seizures, numbness, tingling or weakness. PSYCHIATRIC: No depression, no loss of  interest in normal activity or change in sleep pattern.     Exam:   BP 132/86   Ht 5' 8"  (1.727 m)   Wt 250 lb (113.4 kg)   BMI 38.01 kg/m   Body mass index is 38.01 kg/m.  General appearance : Well developed well nourished female. No acute distress HEENT: Eyes: no retinal hemorrhage or exudates,  Neck supple, trachea midline, no carotid bruits, no thyroidmegaly Lungs: Clear to auscultation, no rhonchi or wheezes, or rib retractions  Heart: Regular rate and rhythm, no murmurs or gallops Breast:Examined in sitting and supine position were symmetrical in appearance, no palpable masses or tenderness,  no skin retraction, no nipple inversion, no nipple discharge, no skin discoloration, no axillary or supraclavicular lymphadenopathy Abdomen: no palpable masses or tenderness, no rebound or guarding Extremities: no edema or skin discoloration or tenderness  Pelvic: Vulva: Normal             Vagina: No gross lesions or discharge.  Pap reflex done  Cervix/Uterus absent  Adnexa  Without masses or tenderness  Anus: Normal   Assessment/Plan:  67 y.o. female for annual exam   1. Encounter for Papanicolaou smear of vagina as part of routine gynecological examination Gynecologic exam status post total hysterectomy.  Pap reflex done on the vaginal vault today.  Breast exam normal.  Screening mammogram negative in April 2019.  Colonoscopy April 2013.  Health labs here today. - CBC - Comp Met (CMET) - TSH - Lipid panel - VITAMIN D 25  Hydroxy (Vit-D Deficiency, Fractures)  2. S/P total hysterectomy and BSO (bilateral salpingo-oophorectomy)  3. Postmenopausal Well on no hormone replacement therapy.  Status post total hysterectomy.  Last bone density was normal in 2016.  Will repeat at 5 years.  Vitamin D supplement, calcium intake of 1.5 g/day and regular weightbearing physical activity recommended.  4. Class 2 obesity due to excess calories without serious comorbidity with body mass index  (BMI) of 38.0 to 38.9 in adult Body mass index 38.01.  Recommend low calorie/low carb diet such as [employer].  Aerobic physical activities 5 times a week and weightlifting every 2 days.  Decision to start on phentermine.  Usage reviewed and prescription sent to pharmacy.  We will follow-up at 3 months for weight management.  Other orders - phentermine (ADIPEX-P) 37.5 MG tablet; Take 1 tablet (37.5 mg total) by mouth daily before breakfast.  Counseling on above issues and coordination of care more than 50% for 10 minutes.  Princess Bruins MD, 9:36 AM 10/01/2018

## 2018-10-02 LAB — COMPREHENSIVE METABOLIC PANEL
AG RATIO: 1.1 (calc) (ref 1.0–2.5)
ALBUMIN MSPROF: 3.9 g/dL (ref 3.6–5.1)
ALT: 14 U/L (ref 6–29)
AST: 16 U/L (ref 10–35)
Alkaline phosphatase (APISO): 76 U/L (ref 33–130)
BUN / CREAT RATIO: 15 (calc) (ref 6–22)
BUN: 16 mg/dL (ref 7–25)
CHLORIDE: 103 mmol/L (ref 98–110)
CO2: 29 mmol/L (ref 20–32)
CREATININE: 1.1 mg/dL — AB (ref 0.50–0.99)
Calcium: 9.4 mg/dL (ref 8.6–10.4)
GLOBULIN: 3.4 g/dL (ref 1.9–3.7)
GLUCOSE: 98 mg/dL (ref 65–99)
POTASSIUM: 5 mmol/L (ref 3.5–5.3)
SODIUM: 139 mmol/L (ref 135–146)
TOTAL PROTEIN: 7.3 g/dL (ref 6.1–8.1)
Total Bilirubin: 0.5 mg/dL (ref 0.2–1.2)

## 2018-10-02 LAB — LIPID PANEL
CHOL/HDL RATIO: 3.4 (calc) (ref <5.0)
Cholesterol: 232 mg/dL — ABNORMAL HIGH (ref <200)
HDL: 69 mg/dL (ref >50)
LDL Cholesterol (Calc): 143 mg/dL (calc) — ABNORMAL HIGH
NON-HDL CHOLESTEROL (CALC): 163 mg/dL — AB (ref <130)
Triglycerides: 95 mg/dL (ref <150)

## 2018-10-02 LAB — TSH: TSH: 1.46 m[IU]/L (ref 0.40–4.50)

## 2018-10-02 LAB — PAP IG W/ RFLX HPV ASCU

## 2018-10-02 LAB — CBC
HCT: 39.3 % (ref 35.0–45.0)
HEMOGLOBIN: 13.3 g/dL (ref 11.7–15.5)
MCH: 30.6 pg (ref 27.0–33.0)
MCHC: 33.8 g/dL (ref 32.0–36.0)
MCV: 90.3 fL (ref 80.0–100.0)
MPV: 10.6 fL (ref 7.5–12.5)
PLATELETS: 341 10*3/uL (ref 140–400)
RBC: 4.35 10*6/uL (ref 3.80–5.10)
RDW: 12.1 % (ref 11.0–15.0)
WBC: 4.7 10*3/uL (ref 3.8–10.8)

## 2018-10-02 LAB — VITAMIN D 25 HYDROXY (VIT D DEFICIENCY, FRACTURES): VIT D 25 HYDROXY: 29 ng/mL — AB (ref 30–100)

## 2018-10-06 ENCOUNTER — Encounter: Payer: Self-pay | Admitting: Obstetrics & Gynecology

## 2018-10-06 NOTE — Patient Instructions (Signed)
1. Encounter for Papanicolaou smear of vagina as part of routine gynecological examination Gynecologic exam status post total hysterectomy.  Pap reflex done on the vaginal vault today.  Breast exam normal.  Screening mammogram negative in April 2019.  Colonoscopy April 2013.  Health labs here today. - CBC - Comp Met (CMET) - TSH - Lipid panel - VITAMIN D 25 Hydroxy (Vit-D Deficiency, Fractures)  2. S/P total hysterectomy and BSO (bilateral salpingo-oophorectomy)  3. Postmenopausal Well on no hormone replacement therapy.  Status post total hysterectomy.  Last bone density was normal in 2016.  Will repeat at 5 years.  Vitamin D supplement, calcium intake of 1.5 g/day and regular weightbearing physical activity recommended.  4. Class 2 obesity due to excess calories without serious comorbidity with body mass index (BMI) of 38.0 to 38.9 in adult Body mass index 38.01.  Recommend low calorie/low carb diet such as [employer].  Aerobic physical activities 5 times a week and weightlifting every 2 days.  Decision to start on phentermine.  Usage reviewed and prescription sent to pharmacy.  We will follow-up at 3 months for weight management.  Other orders - phentermine (ADIPEX-P) 37.5 MG tablet; Take 1 tablet (37.5 mg total) by mouth daily before breakfast.  Ivin Booty, it was a pleasure seeing you today!  I will inform you of your results as soon as they are available.

## 2019-02-07 ENCOUNTER — Other Ambulatory Visit: Payer: Self-pay | Admitting: Obstetrics & Gynecology

## 2019-02-07 DIAGNOSIS — Z1231 Encounter for screening mammogram for malignant neoplasm of breast: Secondary | ICD-10-CM

## 2019-04-14 ENCOUNTER — Other Ambulatory Visit: Payer: Self-pay

## 2019-04-14 ENCOUNTER — Ambulatory Visit
Admission: RE | Admit: 2019-04-14 | Discharge: 2019-04-14 | Disposition: A | Discharge status: Home / Self Care | Payer: BC Managed Care – PPO | Source: Ambulatory Visit | Attending: Obstetrics & Gynecology | Admitting: Obstetrics & Gynecology

## 2019-04-14 DIAGNOSIS — Z1231 Encounter for screening mammogram for malignant neoplasm of breast: Secondary | ICD-10-CM

## 2019-08-20 ENCOUNTER — Encounter: Payer: Self-pay | Admitting: Gynecology

## 2019-10-21 ENCOUNTER — Other Ambulatory Visit: Payer: Self-pay

## 2019-10-22 ENCOUNTER — Ambulatory Visit (INDEPENDENT_AMBULATORY_CARE_PROVIDER_SITE_OTHER): Payer: BC Managed Care – PPO | Admitting: Obstetrics & Gynecology

## 2019-10-22 ENCOUNTER — Encounter: Payer: Self-pay | Admitting: Obstetrics & Gynecology

## 2019-10-22 VITALS — BP 136/88 | Ht 68.0 in | Wt 246.0 lb

## 2019-10-22 DIAGNOSIS — Z780 Asymptomatic menopausal state: Secondary | ICD-10-CM | POA: Diagnosis not present

## 2019-10-22 DIAGNOSIS — Z90722 Acquired absence of ovaries, bilateral: Secondary | ICD-10-CM

## 2019-10-22 DIAGNOSIS — Z01419 Encounter for gynecological examination (general) (routine) without abnormal findings: Secondary | ICD-10-CM

## 2019-10-22 DIAGNOSIS — E6609 Other obesity due to excess calories: Secondary | ICD-10-CM | POA: Diagnosis not present

## 2019-10-22 DIAGNOSIS — Z9079 Acquired absence of other genital organ(s): Secondary | ICD-10-CM

## 2019-10-22 DIAGNOSIS — Z90710 Acquired absence of both cervix and uterus: Secondary | ICD-10-CM | POA: Diagnosis not present

## 2019-10-22 DIAGNOSIS — Z6837 Body mass index (BMI) 37.0-37.9, adult: Secondary | ICD-10-CM

## 2019-10-22 NOTE — Patient Instructions (Signed)
1. Well female exam with routine gynecological exam Gynecologic exam status post total hysterectomy with BSO.  Pap test November 2019 was negative, no indication to repeat this year.  Breast exam normal.  Screening mammogram June 2020 was negative.  Colonoscopy in 2013, on a 10-year schedule.  Will do fasting health labs here today. - CBC - Comp Met (CMET) - TSH - Lipid panel - VITAMIN D 25 Hydroxy (Vit-D Deficiency, Fractures)  2. S/P total hysterectomy and BSO (bilateral salpingo-oophorectomy)  3. Postmenopausal Well on no hormone replacement therapy.  Bone density in May 2016 was normal, will repeat at 5 years.  Continue with vitamin D supplements, calcium intake of 1200 mg daily and regular weightbearing physical activities.  4. Class 2 obesity due to excess calories without serious comorbidity with body mass index (BMI) of 37.0 to 37.9 in adult Congratulations on losing weight since last year.  Continue on a lower calorie/carb diet.  Aerobic physical activities 5 times a week and light weight lifting every 2 days recommended.  Lauren Gray, it was a pleasure seeing you today!  I will inform you of your results as soon as they are available.

## 2019-10-22 NOTE — Progress Notes (Signed)
Lauren Gray [birth year] [id number]   History:    68 y.o. G1P1L1 Married.  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  S/P Total Hysterectomy, then BSO.  No pelvic pain.  No pain with IC.  Urine/BMs wnl.  Breasts normal.  BMI mildly decreased x last yr, now 37.4, lost 4 Lbs.  Walking.  Good nutrition per patient, cooking more. Fasting health labs here today.  Past medical history,surgical history, family history and social history were all reviewed and documented in the EPIC chart.  Gynecologic History No LMP recorded. Patient has had a hysterectomy.  Obstetric History OB History  Gravida Para Term Preterm AB Living  1 1 1     1   SAB TAB Ectopic Multiple Live Births          1    # Outcome Date GA Lbr Len/2nd Weight Sex Delivery Anes PTL Lv  1 Term     F Vag-Spont  N LIV     ROS: A ROS was performed and pertinent positives and negatives are included in the history.  GENERAL: No fevers or chills. HEENT: No change in vision, no earache, sore throat or sinus congestion. NECK: No pain or stiffness. CARDIOVASCULAR: No chest pain or pressure. No palpitations. PULMONARY: No shortness of breath, cough or wheeze. GASTROINTESTINAL: No abdominal pain, nausea, vomiting or diarrhea, melena or bright red blood per rectum. GENITOURINARY: No urinary frequency, urgency, hesitancy or dysuria. MUSCULOSKELETAL: No joint or muscle pain, no back pain, no recent trauma. DERMATOLOGIC: No rash, no itching, no lesions. ENDOCRINE: No polyuria, polydipsia, no heat or cold intolerance. No recent change in weight. HEMATOLOGICAL: No anemia or easy bruising or bleeding. NEUROLOGIC: No headache, seizures, numbness, tingling or weakness. PSYCHIATRIC: No depression, no loss of interest in normal activity or change in sleep pattern.     Exam:   BP 136/88   Ht 5' 8"  (1.727 m)   Wt 246 lb (111.6 kg)   BMI 37.40 kg/m   Body mass index is  37.4 kg/m.  General appearance : Well developed well nourished female. No acute distress HEENT: Eyes: no retinal hemorrhage or exudates,  Neck supple, trachea midline, no carotid bruits, no thyroidmegaly Lungs: Clear to auscultation, no rhonchi or wheezes, or rib retractions  Heart: Regular rate and rhythm, no murmurs or gallops Breast:Examined in sitting and supine position were symmetrical in appearance, no palpable masses or tenderness,  no skin retraction, no nipple inversion, no nipple discharge, no skin discoloration, no axillary or supraclavicular lymphadenopathy Abdomen: no palpable masses or tenderness, no rebound or guarding Extremities: no edema or skin discoloration or tenderness  Pelvic: Vulva: Normal             Vagina: No gross lesions or discharge  Cervix/Uterus absent  Adnexa  Without masses or tenderness  Anus: Normal   Assessment/Plan:  68 y.o. female for annual exam   1. Well female exam with routine gynecological exam Gynecologic exam status post total hysterectomy with BSO.  Pap test November 2019 was negative, no indication to repeat this year.  Breast exam normal.  Screening mammogram June 2020 was negative.  Colonoscopy in 2013, on a 10-year schedule.  Will do fasting health labs here today. - CBC - Comp Met (CMET) - TSH - Lipid panel - VITAMIN D 25 Hydroxy (Vit-D Deficiency, Fractures)  2. S/P total hysterectomy and BSO (bilateral salpingo-oophorectomy)  3. Postmenopausal Well on  no hormone replacement therapy.  Bone density in May 2016 was normal, will repeat at 5 years.  Continue with vitamin D supplements, calcium intake of 1200 mg daily and regular weightbearing physical activities.  4. Class 2 obesity due to excess calories without serious comorbidity with body mass index (BMI) of 37.0 to 37.9 in adult Congratulations on losing weight since last year.  Continue on a lower calorie/carb diet.  Aerobic physical activities 5 times a week and light weight  lifting every 2 days recommended.  Princess Bruins MD, 8:43 AM 10/22/2019

## 2019-10-28 LAB — TEST AUTHORIZATION

## 2019-10-28 LAB — COMPREHENSIVE METABOLIC PANEL
AG Ratio: 1.1 (calc) (ref 1.0–2.5)
ALT: 17 U/L (ref 6–29)
AST: 17 U/L (ref 10–35)
Albumin: 3.9 g/dL (ref 3.6–5.1)
Alkaline phosphatase (APISO): 68 U/L (ref 37–153)
BUN/Creatinine Ratio: 9 (calc) (ref 6–22)
BUN: 10 mg/dL (ref 7–25)
CO2: 28 mmol/L (ref 20–32)
Calcium: 9.3 mg/dL (ref 8.6–10.4)
Chloride: 105 mmol/L (ref 98–110)
Creat: 1.17 mg/dL — ABNORMAL HIGH (ref 0.50–0.99)
Globulin: 3.5 g/dL (calc) (ref 1.9–3.7)
Glucose, Bld: 105 mg/dL — ABNORMAL HIGH (ref 65–99)
Potassium: 4.3 mmol/L (ref 3.5–5.3)
Sodium: 141 mmol/L (ref 135–146)
Total Bilirubin: 0.4 mg/dL (ref 0.2–1.2)
Total Protein: 7.4 g/dL (ref 6.1–8.1)

## 2019-10-28 LAB — CBC
HCT: 37.8 % (ref 35.0–45.0)
Hemoglobin: 12.7 g/dL (ref 11.7–15.5)
MCH: 30.6 pg (ref 27.0–33.0)
MCHC: 33.6 g/dL (ref 32.0–36.0)
MCV: 91.1 fL (ref 80.0–100.0)
MPV: 10.4 fL (ref 7.5–12.5)
Platelets: 363 10*3/uL (ref 140–400)
RBC: 4.15 10*6/uL (ref 3.80–5.10)
RDW: 12.3 % (ref 11.0–15.0)
WBC: 4.2 10*3/uL (ref 3.8–10.8)

## 2019-10-28 LAB — LIPID PANEL
Cholesterol: 216 mg/dL — ABNORMAL HIGH (ref <200)
HDL: 59 mg/dL (ref >=50)
LDL Cholesterol (Calc): 135 mg/dL (calc) — ABNORMAL HIGH
Non-HDL Cholesterol (Calc): 157 mg/dL (calc) — ABNORMAL HIGH (ref <130)
Total CHOL/HDL Ratio: 3.7 (calc) (ref <5.0)
Triglycerides: 113 mg/dL (ref <150)

## 2019-10-28 LAB — VITAMIN D 25 HYDROXY (VIT D DEFICIENCY, FRACTURES): Vit D, 25-Hydroxy: 25 ng/mL — ABNORMAL LOW (ref 30–100)

## 2019-10-28 LAB — TSH: TSH: 1.05 mIU/L (ref 0.40–4.50)

## 2019-10-28 LAB — HEMOGLOBIN A1C W/OUT EAG: Hgb A1c MFr Bld: 5.6 % of total Hgb (ref <5.7)

## 2019-10-30 ENCOUNTER — Other Ambulatory Visit: Payer: Self-pay | Admitting: *Deleted

## 2019-10-30 DIAGNOSIS — E559 Vitamin D deficiency, unspecified: Secondary | ICD-10-CM

## 2019-10-30 MED ORDER — VITAMIN D (ERGOCALCIFEROL) 1.25 MG (50000 UNIT) PO CAPS: ORAL_CAPSULE | ORAL | 0 refills | Status: DC

## 2019-11-11 ENCOUNTER — Telehealth: Payer: Self-pay

## 2019-11-11 MED ORDER — VITAMIN D (ERGOCALCIFEROL) 1.25 MG (50000 UNIT) PO CAPS: ORAL_CAPSULE | ORAL | 0 refills | Status: DC

## 2019-11-11 NOTE — Telephone Encounter (Signed)
Patient called to report that she just realized she had taken a Vitamin D 50,000 every day instead of every week. I advised her to hold off a week or two and then restart. I will send new Rx.  If she has issue with insurance covering Rx again so soon then she will call and we will see what Dr. Marguerita Merles recommends until she can refill it.

## 2020-03-17 ENCOUNTER — Other Ambulatory Visit: Payer: Self-pay | Admitting: Obstetrics & Gynecology

## 2020-03-17 DIAGNOSIS — Z1231 Encounter for screening mammogram for malignant neoplasm of breast: Secondary | ICD-10-CM

## 2020-04-16 ENCOUNTER — Ambulatory Visit: Payer: BC Managed Care – PPO

## 2020-04-16 ENCOUNTER — Other Ambulatory Visit: Payer: Self-pay

## 2020-04-16 ENCOUNTER — Ambulatory Visit
Admission: RE | Admit: 2020-04-16 | Discharge: 2020-04-16 | Disposition: A | Discharge status: Home / Self Care | Payer: BC Managed Care – PPO | Source: Ambulatory Visit | Attending: Obstetrics & Gynecology | Admitting: Obstetrics & Gynecology

## 2020-04-16 DIAGNOSIS — Z1231 Encounter for screening mammogram for malignant neoplasm of breast: Secondary | ICD-10-CM

## 2020-10-26 ENCOUNTER — Encounter: Payer: BC Managed Care – PPO | Admitting: Obstetrics & Gynecology

## 2020-12-17 ENCOUNTER — Encounter: Payer: Self-pay | Admitting: Obstetrics & Gynecology

## 2020-12-17 ENCOUNTER — Other Ambulatory Visit: Payer: Self-pay

## 2020-12-17 ENCOUNTER — Ambulatory Visit (INDEPENDENT_AMBULATORY_CARE_PROVIDER_SITE_OTHER): Payer: BC Managed Care – PPO | Admitting: Obstetrics & Gynecology

## 2020-12-17 VITALS — BP 152/84 | HR 76 | Ht 68.0 in | Wt 236.2 lb

## 2020-12-17 DIAGNOSIS — Z13820 Encounter for screening for osteoporosis: Secondary | ICD-10-CM | POA: Diagnosis not present

## 2020-12-17 DIAGNOSIS — Z780 Asymptomatic menopausal state: Secondary | ICD-10-CM | POA: Diagnosis not present

## 2020-12-17 DIAGNOSIS — Z9079 Acquired absence of other genital organ(s): Secondary | ICD-10-CM

## 2020-12-17 DIAGNOSIS — Z90710 Acquired absence of both cervix and uterus: Secondary | ICD-10-CM | POA: Diagnosis not present

## 2020-12-17 DIAGNOSIS — E559 Vitamin D deficiency, unspecified: Secondary | ICD-10-CM

## 2020-12-17 DIAGNOSIS — E6609 Other obesity due to excess calories: Secondary | ICD-10-CM

## 2020-12-17 DIAGNOSIS — Z6835 Body mass index (BMI) 35.0-35.9, adult: Secondary | ICD-10-CM

## 2020-12-17 DIAGNOSIS — Z01419 Encounter for gynecological examination (general) (routine) without abnormal findings: Secondary | ICD-10-CM | POA: Diagnosis not present

## 2020-12-17 DIAGNOSIS — Z90722 Acquired absence of ovaries, bilateral: Secondary | ICD-10-CM

## 2020-12-17 NOTE — Progress Notes (Signed)
  Lauren Gray 07/18/1951 [id number]   History:    69 y.o. G1P1L1 Married. Dedicated school teacher.  RP:Establishedpatient presenting for annual gyn exam   HPI:Postmenopause, well on no HRT. S/P Total Hysterectomy, then BSO. No pelvic pain. No pain with IC. Urine/BMs wnl. Breasts normal. BMI decreased to 35.91 Walking. Good nutrition per patient, cooking more. Fasting health labs here today.  Past medical history,surgical history, family history and social history were all reviewed and documented in the EPIC chart.  Gynecologic History No LMP recorded. Patient has had a hysterectomy.        Health Maintenance: Pap: 10-01-18 Neg MMG:  04-16-20 3D/Neg/BiRads1 BMD: 04-08-15  Result :Normal Colono 2013 Hep C: 02-17-13 Neg  Obstetric History OB History  Gravida Para Term Preterm AB Living  1 1 1     1  SAB IAB Ectopic Multiple Live Births          1    # Outcome Date GA Lbr Len/2nd Weight Sex Delivery Anes PTL Lv  1 Term     F Vag-Spont  N LIV     ROS: A ROS was performed and pertinent positives and negatives are included in the history.  GENERAL: No fevers or chills. HEENT: No change in vision, no earache, sore throat or sinus congestion. NECK: No pain or stiffness. CARDIOVASCULAR: No chest pain or pressure. No palpitations. PULMONARY: No shortness of breath, cough or wheeze. GASTROINTESTINAL: No abdominal pain, nausea, vomiting or diarrhea, melena or bright red blood per rectum. GENITOURINARY: No urinary frequency, urgency, hesitancy or dysuria. MUSCULOSKELETAL: No joint or muscle pain, no back pain, no recent trauma. DERMATOLOGIC: No rash, no itching, no lesions. ENDOCRINE: No polyuria, polydipsia, no heat or cold intolerance. No recent change in weight. HEMATOLOGICAL: No anemia or easy bruising or bleeding. NEUROLOGIC: No headache, seizures, numbness, tingling or weakness. PSYCHIATRIC: No depression, no loss of interest in normal activity or change in sleep pattern.      Exam:   BP (!) 164/94 (BP Location: Right Arm, Cuff Size: Large)   Pulse 76   Ht 5' 8" (1.727 m)   Wt 236 lb 3.2 oz (107.1 kg)   BMI 35.91 kg/m   Body mass index is 35.91 kg/m.  General appearance : Well developed well nourished female. No acute distress HEENT: Eyes: no retinal hemorrhage or exudates,  Neck supple, trachea midline, no carotid bruits, no thyroidmegaly Lungs: Clear to auscultation, no rhonchi or wheezes, or rib retractions  Heart: Regular rate and rhythm, no murmurs or gallops Breast:Examined in sitting and supine position were symmetrical in appearance, no palpable masses or tenderness,  no skin retraction, no nipple inversion, no nipple discharge, no skin discoloration, no axillary or supraclavicular lymphadenopathy Abdomen: no palpable masses or tenderness, no rebound or guarding Extremities: no edema or skin discoloration or tenderness  Pelvic: Vulva: Normal             Vagina: No gross lesions or discharge  Cervix/Uterus absent  Adnexa  Without masses or tenderness  Anus: Normal   Assessment/Plan:  69 y.o. female for annual exam   1. Well female exam with routine gynecological exam Gynecologic exam s/p Total Hysterectomy.  No indication for a Pap test at this time.  Breast exam normal.  Screening Mammo 04/2020 Negative.  Health labs here today.  Colono 2013. - CBC - Comp Met (CMET) - Lipid Profile - TSH - Vitamin D 1,25 dihydroxy - DG Bone Density; Future  2. S/P total hysterectomy and BSO (  bilateral salpingo-oophorectomy)  3. Postmenopausal Well on no HRT.  4. Screening for osteoporosis Last BD normal in 2016.  Will repeat a BD here now.  Vit D supplements, Ca++ 1.5 g/d, regular weight bearing physical activities.  5. Vitamin D deficiency Vit D level today  6. Class 2 obesity due to excess calories without serious comorbidity with body mass index (BMI) of 35.0 to 35.9 in adult Improving BMI.  Continue with Low Calorie/Carb diet and  fitness.  Marie-Lyne Lavoie MD, 8:48 AM 12/17/2020     

## 2020-12-22 LAB — COMPREHENSIVE METABOLIC PANEL
AG Ratio: 1.3 (calc) (ref 1.0–2.5)
ALT: 14 U/L (ref 6–29)
AST: 20 U/L (ref 10–35)
Albumin: 4 g/dL (ref 3.6–5.1)
Alkaline phosphatase (APISO): 74 U/L (ref 37–153)
BUN/Creatinine Ratio: 12 (calc) (ref 6–22)
BUN: 13 mg/dL (ref 7–25)
CO2: 25 mmol/L (ref 20–32)
Calcium: 9.3 mg/dL (ref 8.6–10.4)
Chloride: 104 mmol/L (ref 98–110)
Creat: 1.12 mg/dL — ABNORMAL HIGH (ref 0.50–0.99)
Globulin: 3.2 g/dL (calc) (ref 1.9–3.7)
Glucose, Bld: 76 mg/dL (ref 65–99)
Potassium: 4.4 mmol/L (ref 3.5–5.3)
Sodium: 141 mmol/L (ref 135–146)
Total Bilirubin: 0.5 mg/dL (ref 0.2–1.2)
Total Protein: 7.2 g/dL (ref 6.1–8.1)

## 2020-12-22 LAB — CBC
HCT: 39.4 % (ref 35.0–45.0)
Hemoglobin: 13.1 g/dL (ref 11.7–15.5)
MCH: 31.3 pg (ref 27.0–33.0)
MCHC: 33.2 g/dL (ref 32.0–36.0)
MCV: 94 fL (ref 80.0–100.0)
MPV: 11.2 fL (ref 7.5–12.5)
Platelets: 311 10*3/uL (ref 140–400)
RBC: 4.19 10*6/uL (ref 3.80–5.10)
RDW: 12 % (ref 11.0–15.0)
WBC: 5.2 10*3/uL (ref 3.8–10.8)

## 2020-12-22 LAB — LIPID PANEL
Cholesterol: 194 mg/dL (ref <200)
HDL: 61 mg/dL (ref >=50)
LDL Cholesterol (Calc): 116 mg/dL (calc) — ABNORMAL HIGH
Non-HDL Cholesterol (Calc): 133 mg/dL (calc) — ABNORMAL HIGH (ref <130)
Total CHOL/HDL Ratio: 3.2 (calc) (ref <5.0)
Triglycerides: 80 mg/dL (ref <150)

## 2020-12-22 LAB — VITAMIN D 1,25 DIHYDROXY
Vitamin D 1, 25 (OH)2 Total: 52 pg/mL (ref 18–72)
Vitamin D2 1, 25 (OH)2: 9 pg/mL
Vitamin D3 1, 25 (OH)2: 43 pg/mL

## 2020-12-22 LAB — TSH: TSH: 0.81 mIU/L (ref 0.40–4.50)

## 2020-12-26 ENCOUNTER — Encounter: Payer: Self-pay | Admitting: Obstetrics & Gynecology

## 2020-12-26 NOTE — Progress Notes (Signed)
LORAYNE GETCHELL [birth year] [id number]   History:    70 y.o. G1P1L1 Married. Dedicated Education officer, [employer].  FO:[id number] presenting for annual gyn exam   SJG:[id number], well on no HRT. S/P Total Hysterectomy, then BSO. No pelvic pain. No pain with IC. Urine/BMs wnl. Breasts normal. BMI decreased to 35.91 Walking. Good nutrition per patient, cooking more. Fasting health labs here today.  Past medical history,surgical history, family history and social history were all reviewed and documented in the EPIC chart.  Gynecologic History No LMP recorded. Patient has had a hysterectomy.        Health Maintenance: Pap: 10-01-18 Neg MMG:  04-16-20 3D/Neg/BiRads1 BMD: 04-08-15  Result :Normal Colono 2013 Hep C: 02-17-13 Neg  Obstetric History OB History  Gravida Para Term Preterm AB Living  _0 SAB IAB Ectopic Multiple Live Births          1    # Outcome Date GA Lbr Len/2nd Weight Sex Delivery Anes PTL Lv  1 Term     F Vag-Spont  N LIV     ROS: A ROS was performed and pertinent positives and negatives are included in the history.  GENERAL: No fevers or chills. HEENT: No change in vision, no earache, sore throat or sinus congestion. NECK: No pain or stiffness. CARDIOVASCULAR: No chest pain or pressure. No palpitations. PULMONARY: No shortness of breath, cough or wheeze. GASTROINTESTINAL: No abdominal pain, nausea, vomiting or diarrhea, melena or bright red blood per rectum. GENITOURINARY: No urinary frequency, urgency, hesitancy or dysuria. MUSCULOSKELETAL: No joint or muscle pain, no back pain, no recent trauma. DERMATOLOGIC: No rash, no itching, no lesions. ENDOCRINE: No polyuria, polydipsia, no heat or cold intolerance. No recent change in weight. HEMATOLOGICAL: No anemia or easy bruising or bleeding. NEUROLOGIC: No headache, seizures, numbness, tingling or weakness. PSYCHIATRIC: No depression, no loss of interest in normal activity or change in sleep pattern.      Exam:   BP (!) 164/94 (BP Location: Right Arm, Cuff Size: Large)   Pulse 76   Ht _1  (1.727 m)   Wt 236 lb 3.2 oz (107.1 kg)   BMI 35.91 kg/m   Body mass index is 35.91 kg/m.  General appearance : Well developed well nourished female. No acute distress HEENT: Eyes: no retinal hemorrhage or exudates,  Neck supple, trachea midline, no carotid bruits, no thyroidmegaly Lungs: Clear to auscultation, no rhonchi or wheezes, or rib retractions  Heart: Regular rate and rhythm, no murmurs or gallops Breast:Examined in sitting and supine position were symmetrical in appearance, no palpable masses or tenderness,  no skin retraction, no nipple inversion, no nipple discharge, no skin discoloration, no axillary or supraclavicular lymphadenopathy Abdomen: no palpable masses or tenderness, no rebound or guarding Extremities: no edema or skin discoloration or tenderness  Pelvic: Vulva: Normal             Vagina: No gross lesions or discharge  Cervix/Uterus absent  Adnexa  Without masses or tenderness  Anus: Normal   Assessment/Plan:  70 y.o. female for annual exam   1. Well female exam with routine gynecological exam Gynecologic exam s/p Total Hysterectomy.  No indication for a Pap test at this time.  Breast exam normal.  Screening Mammo 04/2020 Negative.  Health labs here today.  Colono 2013. - CBC - Comp Met (CMET) - Lipid Profile - TSH - Vitamin D 1,25 dihydroxy - DG Bone Density; Future  2. S/P total hysterectomy and BSO (  bilateral salpingo-oophorectomy)  3. Postmenopausal Well on no HRT.  4. Screening for osteoporosis Last BD normal in 2016.  Will repeat a BD here now.  Vit D supplements, Ca++ 1.5 g/d, regular weight bearing physical activities.  5. Vitamin D deficiency Vit D level today  6. Class 2 obesity due to excess calories without serious comorbidity with body mass index (BMI) of 35.0 to 35.9 in adult Improving BMI.  Continue with Low Calorie/Carb diet and  fitness.  Princess Bruins MD, 8:48 AM 12/17/2020

## 2021-02-22 ENCOUNTER — Other Ambulatory Visit: Payer: Self-pay

## 2021-02-22 ENCOUNTER — Ambulatory Visit (INDEPENDENT_AMBULATORY_CARE_PROVIDER_SITE_OTHER): Payer: BC Managed Care – PPO

## 2021-02-22 ENCOUNTER — Other Ambulatory Visit: Payer: Self-pay | Admitting: Obstetrics & Gynecology

## 2021-02-22 DIAGNOSIS — Z13820 Encounter for screening for osteoporosis: Secondary | ICD-10-CM | POA: Diagnosis not present

## 2021-02-22 DIAGNOSIS — Z780 Asymptomatic menopausal state: Secondary | ICD-10-CM

## 2021-02-22 DIAGNOSIS — Z01419 Encounter for gynecological examination (general) (routine) without abnormal findings: Secondary | ICD-10-CM

## 2021-03-15 ENCOUNTER — Other Ambulatory Visit: Payer: Self-pay | Admitting: Obstetrics & Gynecology

## 2021-03-15 DIAGNOSIS — Z1231 Encounter for screening mammogram for malignant neoplasm of breast: Secondary | ICD-10-CM

## 2021-05-13 ENCOUNTER — Other Ambulatory Visit: Payer: Self-pay

## 2021-05-13 ENCOUNTER — Ambulatory Visit
Admission: RE | Admit: 2021-05-13 | Discharge: 2021-05-13 | Disposition: A | Discharge status: Home / Self Care | Payer: BC Managed Care – PPO | Source: Ambulatory Visit | Attending: Obstetrics & Gynecology | Admitting: Obstetrics & Gynecology

## 2021-05-13 DIAGNOSIS — Z1231 Encounter for screening mammogram for malignant neoplasm of breast: Secondary | ICD-10-CM

## 2021-12-27 ENCOUNTER — Encounter: Payer: Self-pay | Admitting: Obstetrics & Gynecology

## 2021-12-27 ENCOUNTER — Ambulatory Visit (INDEPENDENT_AMBULATORY_CARE_PROVIDER_SITE_OTHER): Payer: BC Managed Care – PPO | Admitting: Obstetrics & Gynecology

## 2021-12-27 ENCOUNTER — Other Ambulatory Visit: Payer: Self-pay

## 2021-12-27 VITALS — BP 126/84 | HR 75 | Resp 16 | Ht 67.5 in | Wt 234.0 lb

## 2021-12-27 DIAGNOSIS — L299 Pruritus, unspecified: Secondary | ICD-10-CM

## 2021-12-27 DIAGNOSIS — Z01419 Encounter for gynecological examination (general) (routine) without abnormal findings: Secondary | ICD-10-CM

## 2021-12-27 DIAGNOSIS — Z780 Asymptomatic menopausal state: Secondary | ICD-10-CM

## 2021-12-27 DIAGNOSIS — Z90722 Acquired absence of ovaries, bilateral: Secondary | ICD-10-CM

## 2021-12-27 DIAGNOSIS — Z9079 Acquired absence of other genital organ(s): Secondary | ICD-10-CM | POA: Diagnosis not present

## 2021-12-27 DIAGNOSIS — Z90710 Acquired absence of both cervix and uterus: Secondary | ICD-10-CM

## 2021-12-27 LAB — LIPID PANEL
Cholesterol: 225 mg/dL — ABNORMAL HIGH (ref <200)
HDL: 74 mg/dL (ref >=50)
LDL Cholesterol (Calc): 129 mg/dL (calc) — ABNORMAL HIGH
Non-HDL Cholesterol (Calc): 151 mg/dL (calc) — ABNORMAL HIGH (ref <130)
Total CHOL/HDL Ratio: 3 (calc) (ref <5.0)
Triglycerides: 113 mg/dL (ref <150)

## 2021-12-27 LAB — CBC
HCT: 41 % (ref 35.0–45.0)
Hemoglobin: 13.6 g/dL (ref 11.7–15.5)
MCH: 31.1 pg (ref 27.0–33.0)
MCHC: 33.2 g/dL (ref 32.0–36.0)
MCV: 93.6 fL (ref 80.0–100.0)
MPV: 10.4 fL (ref 7.5–12.5)
Platelets: 326 10*3/uL (ref 140–400)
RBC: 4.38 10*6/uL (ref 3.80–5.10)
RDW: 11.8 % (ref 11.0–15.0)
WBC: 4.6 10*3/uL (ref 3.8–10.8)

## 2021-12-27 LAB — TSH: TSH: 1.27 mIU/L (ref 0.40–4.50)

## 2021-12-27 LAB — COMPREHENSIVE METABOLIC PANEL
AG Ratio: 1.4 (calc) (ref 1.0–2.5)
ALT: 10 U/L (ref 6–29)
AST: 12 U/L (ref 10–35)
Albumin: 4.2 g/dL (ref 3.6–5.1)
Alkaline phosphatase (APISO): 77 U/L (ref 37–153)
BUN/Creatinine Ratio: 11 (calc) (ref 6–22)
BUN: 12 mg/dL (ref 7–25)
CO2: 31 mmol/L (ref 20–32)
Calcium: 9.5 mg/dL (ref 8.6–10.4)
Chloride: 106 mmol/L (ref 98–110)
Creat: 1.12 mg/dL — ABNORMAL HIGH (ref 0.60–1.00)
Globulin: 3.1 g/dL (calc) (ref 1.9–3.7)
Glucose, Bld: 93 mg/dL (ref 65–99)
Potassium: 4.3 mmol/L (ref 3.5–5.3)
Sodium: 142 mmol/L (ref 135–146)
Total Bilirubin: 0.5 mg/dL (ref 0.2–1.2)
Total Protein: 7.3 g/dL (ref 6.1–8.1)

## 2021-12-27 LAB — VITAMIN D 25 HYDROXY (VIT D DEFICIENCY, FRACTURES): Vit D, 25-Hydroxy: 30 ng/mL (ref 30–100)

## 2021-12-27 MED ORDER — NYSTATIN-TRIAMCINOLONE 100000-0.1 UNIT/GM-% EX OINT: 1.0000 "application " | TOPICAL_OINTMENT | Freq: Two times a day (BID) | CUTANEOUS | 3 refills | Status: DC

## 2021-12-27 NOTE — Progress Notes (Signed)
Harts 02/07/1951 [id number]   History:    71 y.o.  G1P1L1 Married.  Dedicated Education officer, museum.   RP:  Established patient presenting for annual gyn exam    HPI: Postmenopause, well on no HRT.  S/P Total Hysterectomy, then BSO.  No pelvic pain.  No pain with IC.  Pap Neg in 2019.  H/O normal Paps.  C/O itching at skin folds on/off.  Urine/BMs wnl.  Breasts normal.  Screening mammo Neg in 05/2021.  BMI 36.11.  BD normal in 02/2021.  Walking.  Good nutrition per patient, cooking more. Fasting health labs here today.  Will schedule a Colonoscopy this year.   Past medical history,surgical history, family history and social history were all reviewed and documented in the EPIC chart.  Gynecologic History No LMP recorded. Patient has had a hysterectomy.  Obstetric History OB History  Gravida Para Term Preterm AB Living  _0 SAB IAB Ectopic Multiple Live Births          1    # Outcome Date GA Lbr Len/2nd Weight Sex Delivery Anes PTL Lv  1 Term     F Vag-Spont  N LIV     ROS: A ROS was performed and pertinent positives and negatives are included in the history.  GENERAL: No fevers or chills. HEENT: No change in vision, no earache, sore throat or sinus congestion. NECK: No pain or stiffness. CARDIOVASCULAR: No chest pain or pressure. No palpitations. PULMONARY: No shortness of breath, cough or wheeze. GASTROINTESTINAL: No abdominal pain, nausea, vomiting or diarrhea, melena or bright red blood per rectum. GENITOURINARY: No urinary frequency, urgency, hesitancy or dysuria. MUSCULOSKELETAL: No joint or muscle pain, no back pain, no recent trauma. DERMATOLOGIC: No rash, no itching, no lesions. ENDOCRINE: No polyuria, polydipsia, no heat or cold intolerance. No recent change in weight. HEMATOLOGICAL: No anemia or easy bruising or bleeding. NEUROLOGIC: No headache, seizures, numbness, tingling or weakness. PSYCHIATRIC: No depression, no loss of interest in normal activity or change in  sleep pattern.     Exam:   BP 126/84    Pulse 75    Resp 16    Ht 5' 7.5" (1.715 m)    Wt 234 lb (106.1 kg)    BMI 36.11 kg/m   Body mass index is 36.11 kg/m.  General appearance : Well developed well nourished female. No acute distress HEENT: Eyes: no retinal hemorrhage or exudates,  Neck supple, trachea midline, no carotid bruits, no thyroidmegaly Lungs: Clear to auscultation, no rhonchi or wheezes, or rib retractions  Heart: Regular rate and rhythm, no murmurs or gallops Breast:Examined in sitting and supine position were symmetrical in appearance, no palpable masses or tenderness,  no skin retraction, no nipple inversion, no nipple discharge, no skin discoloration, no axillary or supraclavicular lymphadenopathy Abdomen: no palpable masses or tenderness, no rebound or guarding Extremities: no edema or skin discoloration or tenderness  Pelvic: Vulva: Normal             Vagina: No gross lesions or discharge  Cervix/Uterus absent  Adnexa  Without masses or tenderness  Anus: Normal   Assessment/Plan:  71 y.o. female for annual exam   1. Well female exam with routine gynecological exam Postmenopause, well on no HRT.  S/P Total Hysterectomy, then BSO.  No pelvic pain.  No pain with IC.  Pap Neg in 2019.  H/O normal Paps.  Urine/BMs wnl.  Breasts normal.  Screening mammo Neg  in 05/2021.  BMI 36.11.  BD normal in 02/2021.  Walking.  Good nutrition per patient, cooking more. Fasting health labs here today.  Will schedule a Colonoscopy this year. - CBC - Comp Met (CMET) - Lipid Profile - TSH - Vitamin D (25 hydroxy)  2. S/P total hysterectomy and BSO (bilateral salpingo-oophorectomy)  3. Postmenopausal Well on no HRT.  BD normal in 02/2021.  4. Itching with irritation Itching at skin folds intermittently.  No rash visible on skin today.  Will use Mycolog as needed.  Usage reviewed and prescription sent to pharmacy.  Other orders - WEGOVY 2.4 MG/0.75ML SOAJ; SMARTSIG:0.75  Milliliter(s) SUB-Q Once a Week - nystatin-triamcinolone ointment (MYCOLOG); Apply 1 application topically 2 (two) times daily. Thin application on affected skin as needed.   Princess Bruins MD, 8:27 AM 12/27/2021

## 2022-01-02 ENCOUNTER — Telehealth: Payer: Self-pay

## 2022-01-02 NOTE — Telephone Encounter (Signed)
Patient called because she had not heard about her lab results from 12/27/21.  Advised Dr. Mackey Birchwood has not yet reviewed them with her. I did relay to her that for the most part they were normal. Creatinine elevated but historically that runs a little elevated.  Her lipid panel is elevated and we talked about the results and all the normal ranges.  I told her I suspect Dr. Mackey Birchwood will want her to follow up with PCP but I will call her once I receive Dr. Simon Rhein result note with recommendations.  We discussed that her Vit D level is 30 and she has been deficient in the past. She said she takes 2000 IU's daily and I told her Dr. Mackey Birchwood might recommend she increase by another 1000 ius.  Again, I told her I will call her with Dr. Simon Rhein recommendations with result note.

## 2022-04-12 ENCOUNTER — Other Ambulatory Visit: Payer: Self-pay | Admitting: Obstetrics & Gynecology

## 2022-04-12 DIAGNOSIS — Z1231 Encounter for screening mammogram for malignant neoplasm of breast: Secondary | ICD-10-CM

## 2022-04-28 IMAGING — MG MM DIGITAL SCREENING BILAT W/ TOMO AND CAD
8 series · 8 of 24 positions shown · non-contrast
Comparison: Previous exam(s).

CLINICAL DATA: Screening.

EXAM:
DIGITAL SCREENING BILATERAL MAMMOGRAM WITH TOMOSYNTHESIS AND CAD
TECHNIQUE: Bilateral screening digital craniocaudal and mediolateral oblique
mammograms were obtained. Bilateral screening digital breast
tomosynthesis was performed. The images were evaluated with
computer-aided detection.

[R MLO synth-2D]
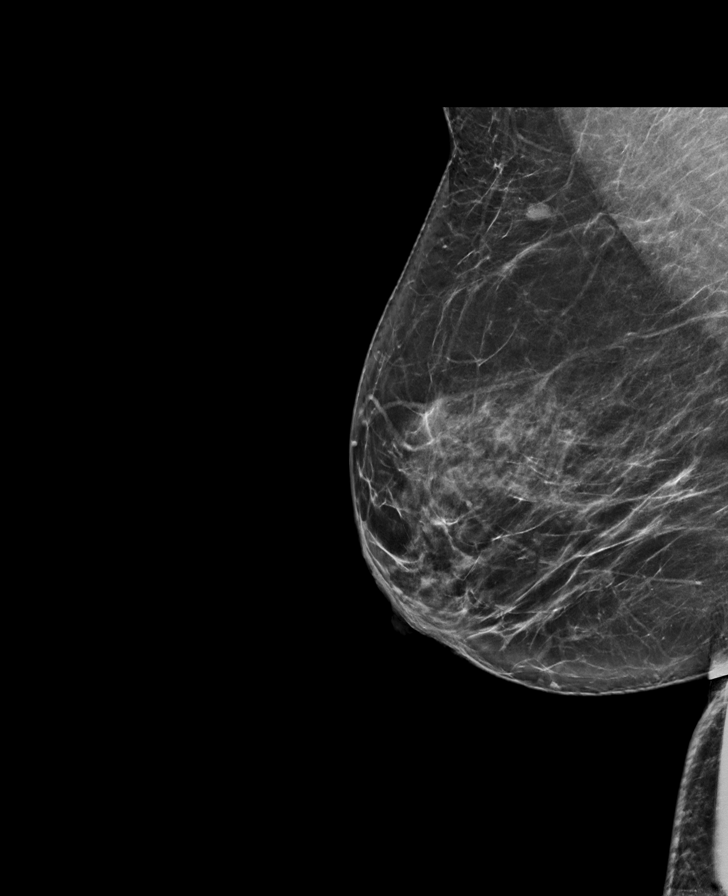

[R CC synth-2D]
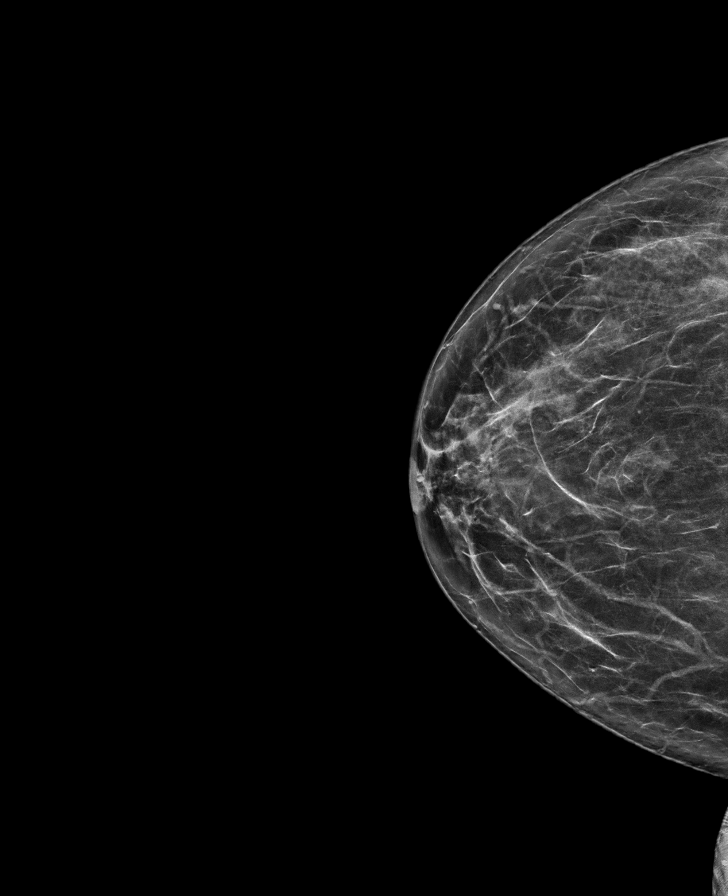

[L CC synth-2D]
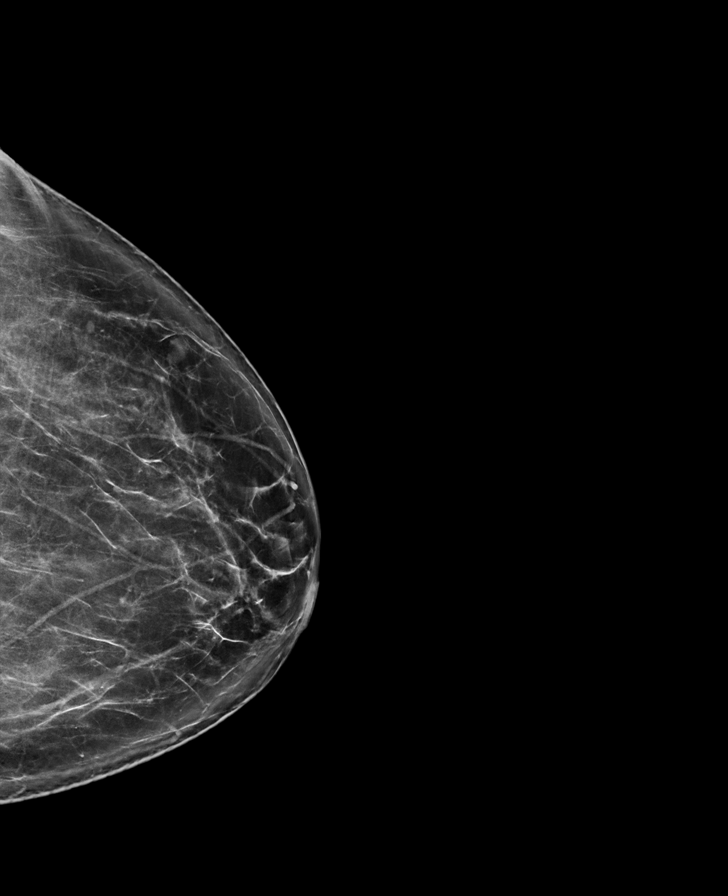

[L MLO synth-2D]
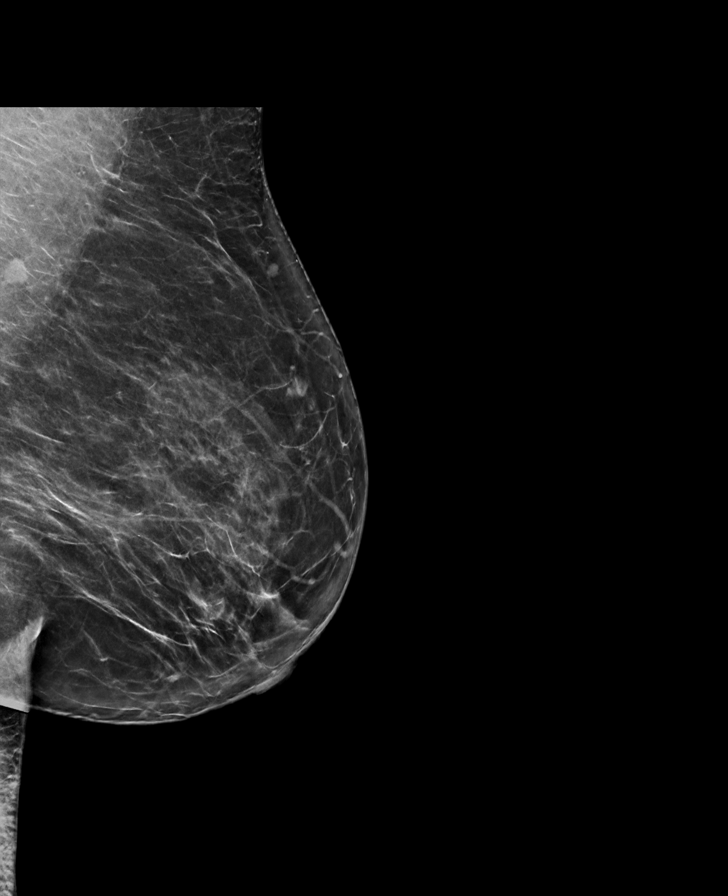

[R CC tomo · tomo slice 37/73.0]
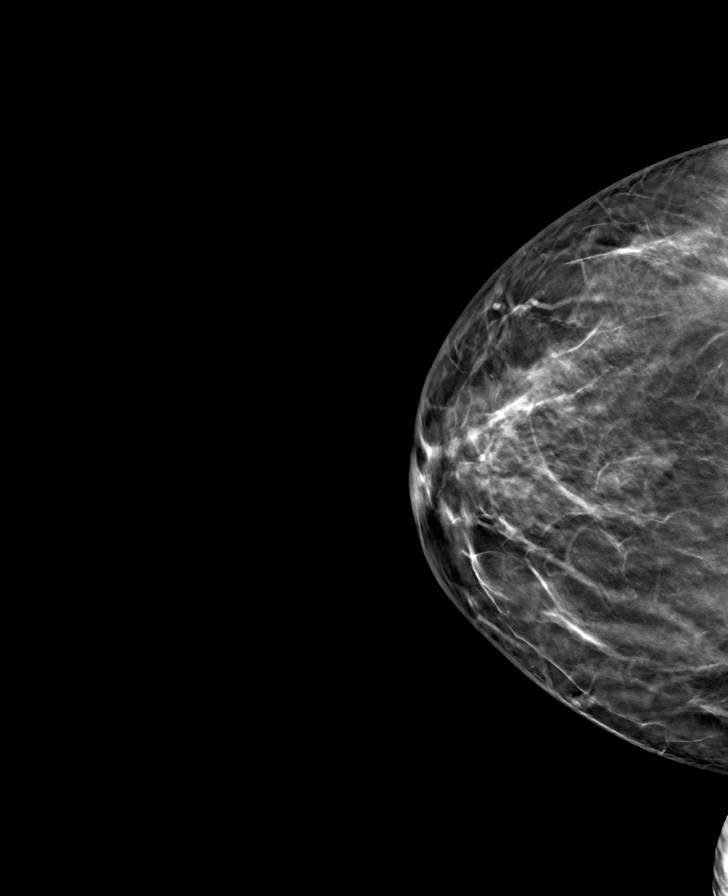

[L MLO tomo · tomo slice 41/80.0]
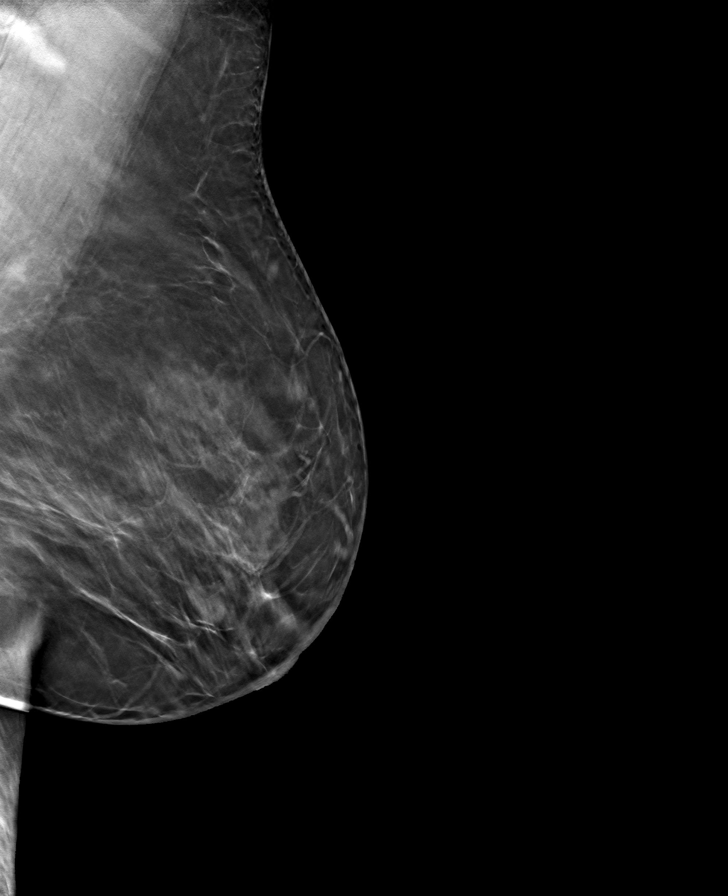

[L CC tomo · tomo slice 41/81.0]
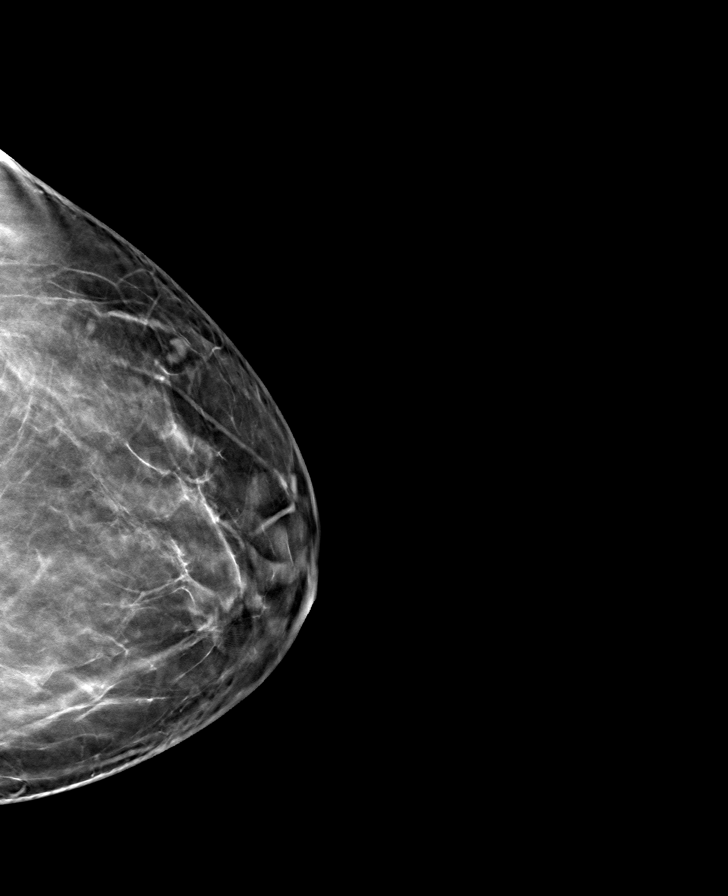

[R MLO tomo · tomo slice 41/81.0]
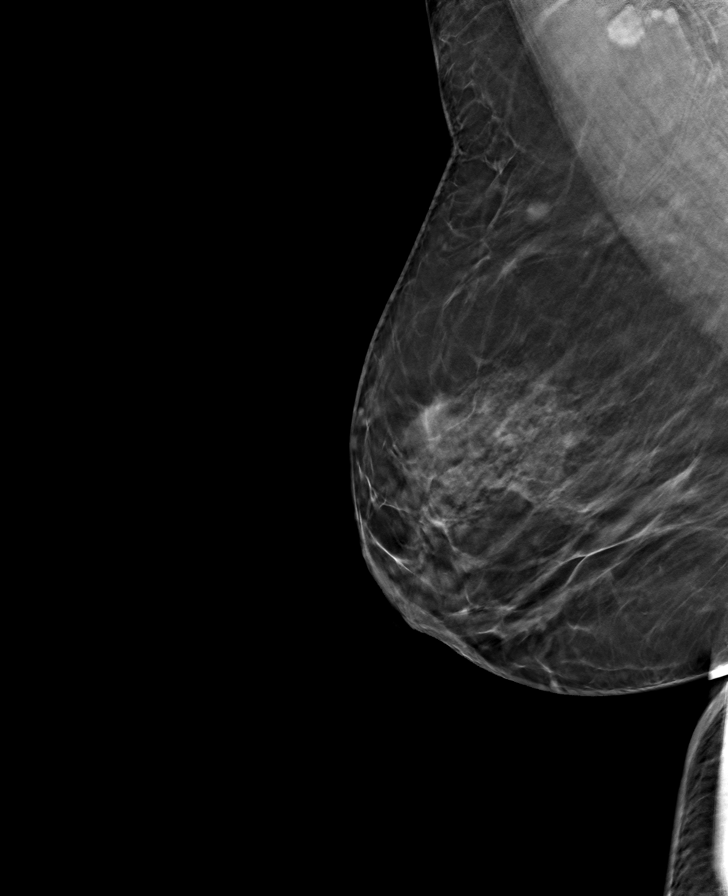

[8 of 24 positions shown; findings below may reference images not displayed]

ACR Breast Density Category b: There are scattered areas of
fibroglandular density.
FINDINGS: There are no findings suspicious for malignancy.
IMPRESSION: No mammographic evidence of malignancy. A result letter of this
screening mammogram will be mailed directly to the patient.

RECOMMENDATION:
Screening mammogram in one year. (Code:51-O-LD2)

BI-RADS CATEGORY  1: Negative.

## 2022-05-19 ENCOUNTER — Ambulatory Visit
Admission: RE | Admit: 2022-05-19 | Discharge: 2022-05-19 | Disposition: A | Discharge status: Home / Self Care | Payer: BC Managed Care – PPO | Source: Ambulatory Visit | Attending: Obstetrics & Gynecology | Admitting: Obstetrics & Gynecology

## 2022-05-19 DIAGNOSIS — Z1231 Encounter for screening mammogram for malignant neoplasm of breast: Secondary | ICD-10-CM

## 2023-01-23 ENCOUNTER — Emergency Department (HOSPITAL_COMMUNITY)
Admission: EM | Admit: 2023-01-23 | Discharge: 2023-01-23 | Disposition: A | Discharge status: Home / Self Care | Payer: BC Managed Care – PPO | Attending: Emergency Medicine | Admitting: Emergency Medicine

## 2023-01-23 ENCOUNTER — Emergency Department (HOSPITAL_COMMUNITY): Payer: BC Managed Care – PPO

## 2023-01-23 ENCOUNTER — Other Ambulatory Visit: Payer: Self-pay

## 2023-01-23 ENCOUNTER — Encounter (HOSPITAL_COMMUNITY): Payer: Self-pay

## 2023-01-23 DIAGNOSIS — H538 Other visual disturbances: Secondary | ICD-10-CM | POA: Insufficient documentation

## 2023-01-23 DIAGNOSIS — H539 Unspecified visual disturbance: Secondary | ICD-10-CM

## 2023-01-23 LAB — CBC WITH DIFFERENTIAL/PLATELET
Abs Immature Granulocytes: 0.01 10*3/uL (ref 0.00–0.07)
Basophils Absolute: 0 10*3/uL (ref 0.0–0.1)
Basophils Relative: 1 %
Eosinophils Absolute: 0.1 10*3/uL (ref 0.0–0.5)
Eosinophils Relative: 2 %
HCT: 40.3 % (ref 36.0–46.0)
Hemoglobin: 13.4 g/dL (ref 12.0–15.0)
Immature Granulocytes: 0 %
Lymphocytes Relative: 44 %
Lymphs Abs: 1.9 10*3/uL (ref 0.7–4.0)
MCH: 31.3 pg (ref 26.0–34.0)
MCHC: 33.3 g/dL (ref 30.0–36.0)
MCV: 94.2 fL (ref 80.0–100.0)
Monocytes Absolute: 0.3 10*3/uL (ref 0.1–1.0)
Monocytes Relative: 7 %
Neutro Abs: 2 10*3/uL (ref 1.7–7.7)
Neutrophils Relative %: 46 %
Platelets: 293 10*3/uL (ref 150–400)
RBC: 4.28 MIL/uL (ref 3.87–5.11)
RDW: 12.7 % (ref 11.5–15.5)
WBC: 4.2 10*3/uL (ref 4.0–10.5)
nRBC: 0 % (ref 0.0–0.2)

## 2023-01-23 LAB — COMPREHENSIVE METABOLIC PANEL
ALT: 18 U/L (ref 0–44)
AST: 16 U/L (ref 15–41)
Albumin: 3.6 g/dL (ref 3.5–5.0)
Alkaline Phosphatase: 66 U/L (ref 38–126)
Anion gap: 9 (ref 5–15)
BUN: 12 mg/dL (ref 8–23)
CO2: 26 mmol/L (ref 22–32)
Calcium: 9 mg/dL (ref 8.9–10.3)
Chloride: 105 mmol/L (ref 98–111)
Creatinine, Ser: 1.07 mg/dL — ABNORMAL HIGH (ref 0.44–1.00)
GFR, Estimated: 56 mL/min — ABNORMAL LOW (ref >60)
Glucose, Bld: 91 mg/dL (ref 70–99)
Potassium: 3.6 mmol/L (ref 3.5–5.1)
Sodium: 140 mmol/L (ref 135–145)
Total Bilirubin: 0.4 mg/dL (ref 0.3–1.2)
Total Protein: 7.1 g/dL (ref 6.5–8.1)

## 2023-01-23 NOTE — ED Provider Triage Note (Signed)
Emergency Medicine Provider Triage Evaluation Note  Lauren Gray , a 72 y.o. female  was evaluated in triage.  Pt complains of right eye vision loss.  She reports that she has been evaluated by her optometrist at [facility] retina for further evaluation and was advised to come to the emergency department for imaging to rule out possible CVA versus amaurosis fugax.  Based on note from ophthalmology, no specific cause of patient's transient vision loss has been able to been determined at this point.  Denies any eye pain.  Review of Systems  Positive: As above Negative: As above  Physical Exam  BP (!) 187/90 (BP Location: Right Arm)   Pulse 80   Temp 97.7 F (36.5 C) (Oral)   Resp 18   SpO2 100%  Gen:   Awake, no distress   Resp:  Normal effort  MSK:   Moves extremities without difficulty  Other:  Bilateral pupils are dilated and patient reports that she has had pupils dilated 2 times today by ophthalmology  Medical Decision Making  Medically screening exam initiated at 4:36 PM.  Appropriate orders placed.  Lauren Gray was informed that the remainder of the evaluation will be completed by another provider, this initial triage assessment does not replace that evaluation, and the importance of remaining in the ED until their evaluation is complete.     Luvenia Heller, PA-C 01/23/23 [phone]

## 2023-01-23 NOTE — ED Triage Notes (Addendum)
Pt c/o decreased R eye starting this morning.  Pt reports intermittently feeling "like there is a film over the eye."  Pt has been seen several times today, including at [facility], for same and was sent to the ED for evaluation.    Pt reports she "feels fine" and vision is getting somewhat better.  Pt reports she is unsure why she was directed to the ED.

## 2023-01-23 NOTE — ED Provider Notes (Signed)
[facility] Provider Note  CSN: [id number] Arrival date & time: 01/23/23 1555  Chief Complaint(s) Eye Problem  HPI Lauren Gray is a 72 y.o. female with past medical history as below, significant for hysterectomy who presents to the ED with complaint of vision changes.  Patient reports around 7-7 30 in this morning while getting ready for work she felt her eye was very itchy on the right, she rubbed her eye aggressively and find that she is having difficulty reading words on her phone.  She felt as though the letters and numbers were "floating off to the side."  The symptoms gradually improved.  She is now essentially back to her baseline from a vision standpoint.  She was seen by her optometrist earlier today, concern for possible amaurosis.  She has no numbness, tingling, speech disturbance, difficulty speaking or swallowing, no difficulty with ambulation, no weakness.  No headache.  Denies similar symptoms in the past but no recent medication or diet changes.  She feels essentially back to her baseline at this time.  Past Medical History History reviewed. No pertinent past medical history. Patient Active Problem List   Diagnosis Date Noted   Weight gain 05/25/2017   Family history of uterine cancer 05/04/2017   Groin pain 06/11/2015   Family history of breast cancer in female 02/17/2013   H/O vitamin D deficiency 02/17/2013   Family history of diabetes mellitus 02/17/2013   Home Medication(s) Prior to Admission medications   Medication Sig Start Date End Date Taking? Authorizing Provider  Ascorbic Acid (VITAMIN C) 1000 MG tablet Take 1,000 mg by mouth daily.    [provider]  cholecalciferol (VITAMIN D) 1000 UNITS tablet Take 1,000 Units by mouth daily.    [provider]  [facility] Liver Oil 5000-500 UNIT/5ML OIL Take by mouth.    [provider]  nystatin-triamcinolone ointment (MYCOLOG) Apply 1 application  topically 2 (two) times daily. Thin application on affected skin as needed. 12/27/21   Princess Bruins, MD  WEGOVY 2.4 MG/0.75ML SOAJ SMARTSIG:0.75 Milliliter(s) SUB-Q Once a Week 12/17/21   [provider]                                                                                                                                    Past Surgical History Past Surgical History:  Procedure Laterality Date   ABDOMINAL HYSTERECTOMY     TVH   PELVIC LAPAROSCOPY  04/06/2006   BSO   Family History Family History  Problem Relation Age of Onset   Cancer Mother        ENDOMETRIOD OF OVARY   Diabetes Father    Heart disease Father    Breast cancer Sister 52   Aneurysm Sister    Diabetes Paternal Grandmother     Social History Social History   Tobacco Use   Smoking status: Never   Smokeless tobacco: Never  Vaping Use   Vaping Use: Never used  Substance Use Topics   Alcohol use: Not Currently   Drug use: No   Allergies Latex and Codeine  Review of Systems Review of Systems  Constitutional:  Negative for activity change and fever.  HENT:  Negative for facial swelling and trouble swallowing.   Eyes:  Positive for itching and visual disturbance. Negative for discharge and redness.  Respiratory:  Negative for cough and shortness of breath.   Cardiovascular:  Negative for chest pain and palpitations.  Gastrointestinal:  Negative for abdominal pain and nausea.  Genitourinary:  Negative for dysuria and flank pain.  Musculoskeletal:  Negative for back pain and gait problem.  Skin:  Negative for pallor and rash.  Neurological:  Negative for syncope and headaches.    Physical Exam Vital Signs  I have reviewed the triage vital signs BP (!) 182/84   Pulse 80   Temp 97.7 F (36.5 C)   Resp 18   SpO2 100%  Physical Exam Vitals and nursing note reviewed.  Constitutional:      General: She is not in acute distress.    Appearance: Normal appearance. She is not  ill-appearing or diaphoretic.  HENT:     Head: Normocephalic and atraumatic.     Right Ear: External ear normal.     Left Ear: External ear normal.     Nose: Nose normal.     Mouth/Throat:     Mouth: Mucous membranes are moist.  Eyes:     General: No visual field deficit or scleral icterus.       Right eye: No discharge.        Left eye: No discharge.     Extraocular Movements: Extraocular movements intact.     Pupils: Pupils are equal, round, and reactive to light.     Comments: Eyes dilated b/l (was dilated at eye dr pta)  Cardiovascular:     Rate and Rhythm: Normal rate and regular rhythm.     Pulses: Normal pulses.     Heart sounds: Normal heart sounds.  Pulmonary:     Effort: Pulmonary effort is normal. No respiratory distress.     Breath sounds: Normal breath sounds.  Abdominal:     General: Abdomen is flat.     Tenderness: There is no abdominal tenderness.  Musculoskeletal:        General: Normal range of motion.     Cervical back: No rigidity.     Right lower leg: No edema.     Left lower leg: No edema.  Skin:    General: Skin is warm and dry.     Capillary Refill: Capillary refill takes less than 2 seconds.  Neurological:     Mental Status: She is alert and oriented to person, place, and time.     GCS: GCS eye subscore is 4. GCS verbal subscore is 5. GCS motor subscore is 6.     Cranial Nerves: Cranial nerves 2-12 are intact. No dysarthria or facial asymmetry.     Sensory: Sensation is intact.     Motor: Motor function is intact. No tremor or pronator drift.     Coordination: Coordination is intact. Finger-Nose-Finger Test normal.     Gait: Gait is intact.     Comments: NIHSS 0 Strength 5/5 BLUE BLLE BLUE/BLLE NVI  Psychiatric:        Mood and Affect: Mood normal.        Behavior: Behavior normal.     ED  Results and Treatments Labs (all labs ordered are listed, but only abnormal results are displayed) Labs Reviewed  COMPREHENSIVE METABOLIC PANEL -  Abnormal; Notable for the following components:      Result Value   Creatinine, Ser 1.07 (*)    GFR, Estimated 56 (*)    All other components within normal limits  CBC WITH DIFFERENTIAL/PLATELET                                                                                                                          Radiology CT Head Wo Contrast  Result Date: 01/23/2023 CLINICAL DATA:  Decreased vision right eye since this morning, TIA symptoms EXAM: CT HEAD WITHOUT CONTRAST TECHNIQUE: Contiguous axial images were obtained from the base of the skull through the vertex without intravenous contrast. RADIATION DOSE REDUCTION: This exam was performed according to the departmental dose-optimization program which includes automated exposure control, adjustment of the mA and/or kV according to patient size and/or use of iterative reconstruction technique. COMPARISON:  10/31/2012 FINDINGS: Brain: No acute infarct or hemorrhage. Hypodensities within the left frontal and bilateral parietal periventricular white matter most consistent with chronic small vessel ischemic change. Lateral ventricles and midline structures are unremarkable. No acute extra-axial fluid collections. No mass effect. Vascular: No hyperdense vessel or unexpected calcification. Skull: Normal. Negative for fracture or focal lesion. Sinuses/Orbits: No acute finding. Other: None. IMPRESSION: 1. No acute intracranial process. 2. Chronic small vessel ischemic changes in the periventricular white matter as above. Electronically Signed   By: Randa Ngo M.D.   On: 01/23/2023 18:01    Pertinent labs & imaging results that were available during my care of the patient were reviewed by me and considered in my medical decision making (see MDM for details).  Medications Ordered in ED Medications - No data to display                                                                                                                                    Procedures Procedures  (including critical care time)  Medical Decision Making / ED Course    Medical Decision Making:    Lauren Gray is a 72 y.o. female with past medical history as below, significant for hysterectomy who presents to the ED with complaint of vision changes.. The complaint involves an extensive differential diagnosis and also carries with it a high risk of complications  and morbidity.  Serious etiology was considered. Ddx includes but is not limited to: CRAO, CRVO, amaurosis, glaucoma, CVA, medication effect,   Complete initial physical exam performed, notably the patient  was NAD, asymptomatic .    Reviewed and confirmed nursing documentation for past medical history, family history, social history.  Vital signs reviewed.    Clinical Course as of 01/24/23 0011  Tue Jan 23, 2023  2238 D/w Neuro, unlikely to be CVA. No need for MRI at this time.  [SG]    Clinical Course User Index [SG] Jeanell Sparrow, DO   Labs reviewed and are stable [facility] was unremarkable Neuro exam is non-focal Suspicion for CVA is unlikely Vision is back to baseline D/w neuro on call ?monocular diplopia She is back to baseline    The patient improved significantly and was discharged in stable condition. Detailed discussions were had with the patient regarding current findings, and need for close f/u with PCP or on call doctor. The patient has been instructed to return immediately if the symptoms worsen in any way for re-evaluation. Patient verbalized understanding and is in agreement with current care plan. All questions answered prior to discharge.   Additional history obtained: -Additional history obtained from family -External records from outside source obtained and reviewed including: Chart review including previous notes, labs, imaging, consultation notes including home medications, primary care documentation, prior labs and imaging.  Paper note from eye doctor, home  medications   Lab Tests: -I ordered, reviewed, and interpreted labs.   The pertinent results include:   Labs Reviewed  COMPREHENSIVE METABOLIC PANEL - Abnormal; Notable for the following components:      Result Value   Creatinine, Ser 1.07 (*)    GFR, Estimated 56 (*)    All other components within normal limits  CBC WITH DIFFERENTIAL/PLATELET    Notable for labs stable  EKG   EKG Interpretation  Date/Time:  Tuesday January 23 2023 16:04:39 EDT Ventricular Rate:  79 PR Interval:  160 QRS Duration: 74 QT Interval:  400 QTC Calculation: 458 R Axis:   5 Text Interpretation: Normal sinus rhythm Minimal voltage criteria for LVH, may be normal variant ( R in aVL ) Possible Anterior infarct , age undetermined Abnormal ECG When compared with ECG of 27-Mar-2014 08:51, PREVIOUS ECG IS PRESENT no stemi Confirmed by Wynona Dove ([id number]) on 01/24/2023 12:11:09 AM         Imaging Studies ordered: I ordered imaging studies including CT head I independently visualized the following imaging with scope of interpretation limited to determining acute life threatening conditions related to emergency care: CT head unremarkable I independently visualized and interpreted imaging. I agree with the radiologist interpretation   Medicines ordered and prescription drug management: No orders of the defined types were placed in this encounter.   -I have reviewed the patients home medicines and have made adjustments as needed   Consultations Obtained: I requested consultation with the dr Leonel Ramsay,  and discussed lab and imaging findings as well as pertinent plan - they recommend: no further intervention   Cardiac Monitoring: The patient was maintained on a cardiac monitor.  I personally viewed and interpreted the cardiac monitored which showed an underlying rhythm of: na  Social Determinants of Health:  Diagnosis or treatment significantly limited by social determinants of health:  na   Reevaluation: After the interventions noted above, I reevaluated the patient and found that they have resolved  Co morbidities that complicate the patient evaluation History reviewed. No pertinent  past medical history.    Dispostion: Disposition decision including need for hospitalization was considered, and patient discharged from emergency department.    Final Clinical Impression(s) / ED Diagnoses Final diagnoses:  Vision changes     This chart was dictated using voice recognition software.  Despite best efforts to proofread,  errors can occur which can change the documentation meaning.    Jeanell Sparrow, DO 01/24/23 0011

## 2023-01-23 NOTE — Discharge Instructions (Addendum)
Please follow-up with your eye specialist  Your CT imaging of the head was negative, labs were stable  It was a pleasure caring for you today in the emergency department.  Please return to the emergency department for any worsening or worrisome symptoms.

## 2023-02-09 ENCOUNTER — Ambulatory Visit (INDEPENDENT_AMBULATORY_CARE_PROVIDER_SITE_OTHER): Payer: BC Managed Care – PPO | Admitting: Nurse Practitioner

## 2023-02-09 ENCOUNTER — Encounter: Payer: Self-pay | Admitting: Nurse Practitioner

## 2023-02-09 VITALS — BP 131/59 | HR 67 | Temp 97.2°F | Ht 67.5 in | Wt 246.2 lb

## 2023-02-09 DIAGNOSIS — M25561 Pain in right knee: Secondary | ICD-10-CM | POA: Diagnosis not present

## 2023-02-09 DIAGNOSIS — Z0000 Encounter for general adult medical examination without abnormal findings: Secondary | ICD-10-CM | POA: Insufficient documentation

## 2023-02-09 DIAGNOSIS — Z130 Encounter for screening for diseases of the blood and blood-forming organs and certain disorders involving the immune mechanism: Secondary | ICD-10-CM

## 2023-02-09 DIAGNOSIS — Z1321 Encounter for screening for nutritional disorder: Secondary | ICD-10-CM

## 2023-02-09 DIAGNOSIS — Z1329 Encounter for screening for other suspected endocrine disorder: Secondary | ICD-10-CM

## 2023-02-09 DIAGNOSIS — E669 Obesity, unspecified: Secondary | ICD-10-CM | POA: Insufficient documentation

## 2023-02-09 DIAGNOSIS — G8929 Other chronic pain: Secondary | ICD-10-CM

## 2023-02-09 DIAGNOSIS — Z13228 Encounter for screening for other metabolic disorders: Secondary | ICD-10-CM

## 2023-02-09 NOTE — Assessment & Plan Note (Signed)
Continue OTC Tylenol as needed Application of heat and ice encouraged  loose weight

## 2023-02-09 NOTE — Progress Notes (Signed)
New Patient Office Visit  Subjective:  Patient ID: Lauren Gray, female    DOB: 1951/09/22  Age: 72 y.o. MRN: [id number]  CC:  Chief Complaint  Patient presents with   Annual Exam    Fasting     HPI Lauren Gray is a 72 y.o. female  has a past medical history of Obesity.  Patient presents for annual physical examination and to establish care.  She has been seeing Dr. Princess Bruins yearly for well female exam.  Obesity.  She was previously on Wegovy, she reported that she lost some weight while on Wegovy.  unfortunately she lost her brother at the time and was not exercising as she should, she gained back her weight and insurance stopped paying for  [facility].  She has recently tried starting zepbound but her pharmacy is working on getting her a discount for the new medication.  She plans to follow-up with her pharmacy.  She also plans on starting back on exercises soon.  She continues to work on her diet states that she loves to paint bread.   Patient complains of chronic right knee pain and swelling worse with ambulation.  She has been taking OTC pain medications.  No complaints of numbness, tingling,.  She has done physical therapy in the past.  She believes that losing weight will help her pain.   Patient stated that she she is up-to-date with Tdap and flu vaccine, also up-to-date with colonoscopy and mammogram ,she could not remember where she had her colonoscopy done   She has upcoming appointment with her ophthalmologist for a eye  procedure next week.  Breast exam deferred today.  Patient encouraged to get her pneumonia vaccine and shingles vaccine stated that she will get the vaccines after her eye procedure next week.     Past Medical History:  Diagnosis Date   Obesity     Past Surgical History:  Procedure Laterality Date   ABDOMINAL HYSTERECTOMY     TVH   PELVIC LAPAROSCOPY  04/06/2006   BSO    Family History  Problem Relation Age of Onset   Cancer Mother         ENDOMETRIOD OF OVARY   Diabetes Father    Heart disease Father    Breast cancer Sister 2   Aneurysm Sister    Diabetes Paternal Grandmother     Social History   Socioeconomic History   Marital status: Married    Spouse name: Not on file   Number of children: 1   Years of education: Not on file   Highest education level: Not on file  Occupational History   Not on file  Tobacco Use   Smoking status: Never   Smokeless tobacco: Never  Vaping Use   Vaping Use: Never used  Substance and Sexual Activity   Alcohol use: Not Currently   Drug use: No   Sexual activity: Yes    Partners: Male    Birth control/protection: Surgical    Comment: HYSTERECTOMY  Other Topics Concern   Not on file  Social History Narrative   Lives with her husband    Social Determinants of Health   Financial Resource Strain: Not on file  Food Insecurity: Not on file  Transportation Needs: Not on file  Physical Activity: Not on file  Stress: Not on file  Social Connections: Not on file  Intimate Partner Violence: Not on file    ROS Review of Systems  Constitutional: Negative.   HENT: Negative.  Eyes:  Positive for visual disturbance. Negative for pain and itching.  Respiratory: Negative.    Cardiovascular: Negative.   Gastrointestinal: Negative.   Endocrine: Negative.   Genitourinary: Negative.   Musculoskeletal:  Positive for arthralgias and joint swelling.  Skin: Negative.   Allergic/Immunologic: Negative.   Neurological: Negative.   Hematological: Negative.   Psychiatric/Behavioral: Negative.      Objective:   Today's Vitals: BP (!) 131/59   Pulse 67   Temp (!) 97.2 F (36.2 C)   Ht 5' 7.5" (1.715 m)   Wt 246 lb 3.2 oz (111.7 kg)   SpO2 100%   BMI 37.99 kg/m   Physical Exam Constitutional:      General: She is not in acute distress.    Appearance: She is obese. She is not ill-appearing, toxic-appearing or diaphoretic.  HENT:     Right Ear: Tympanic membrane, ear  canal and external ear normal. There is no impacted cerumen.     Left Ear: Tympanic membrane, ear canal and external ear normal. There is no impacted cerumen.     Nose: Nose normal. No congestion or rhinorrhea.     Mouth/Throat:     Mouth: Mucous membranes are moist.     Pharynx: No oropharyngeal exudate or posterior oropharyngeal erythema.  Eyes:     General: No scleral icterus. Neck:     Vascular: No carotid bruit.  Cardiovascular:     Rate and Rhythm: Normal rate and regular rhythm.     Pulses: Normal pulses.     Heart sounds: Normal heart sounds. No murmur heard.    No friction rub. No gallop.  Pulmonary:     Effort: Pulmonary effort is normal. No respiratory distress.     Breath sounds: Normal breath sounds. No stridor. No wheezing, rhonchi or rales.  Chest:     Chest wall: No tenderness.  Abdominal:     General: There is no distension.     Palpations: Abdomen is soft.     Tenderness: There is no abdominal tenderness. There is no guarding.  Musculoskeletal:        General: No swelling, deformity or signs of injury.     Cervical back: Normal range of motion and neck supple. No rigidity or tenderness.     Right lower leg: No edema.     Left lower leg: No edema.  Lymphadenopathy:     Cervical: No cervical adenopathy.  Skin:    Capillary Refill: Capillary refill takes less than 2 seconds.     Coloration: Skin is not jaundiced or pale.     Findings: No bruising, erythema or lesion.  Neurological:     Mental Status: She is alert and oriented to person, place, and time.     Cranial Nerves: No cranial nerve deficit.     Sensory: No sensory deficit.     Motor: No weakness.     Coordination: Coordination normal.     Gait: Gait normal.     Deep Tendon Reflexes: Reflexes normal.  Psychiatric:        Mood and Affect: Mood normal.        Behavior: Behavior normal.        Thought Content: Thought content normal.        Judgment: Judgment normal.     Assessment & Plan:    Problem List Items Addressed This Visit       Other   Annual physical exam - Primary    Annual exam as documented.  Counseling  done include healthy lifestyle involving committing to 150 minutes of exercise per week, heart healthy diet, and attaining healthy weight. The importance of adequate sleep also discussed.  Regular use of seat belt and home safety were also discussed . Changes in health habits are decided on by patient with goals and time frames set for achieving them. Immunization and cancer screening  needs are specifically addressed at this visit.   Patient encouraged to get shingles vaccine and pneumonia vaccine.  Up-to-date with flu vaccine and Tdap vaccine, colonoscopy and mammogram.  Will check fasting labs      Obesity    Wt Readings from Last 3 Encounters:  02/09/23 246 lb 3.2 oz (111.7 kg)  12/27/21 234 lb (106.1 kg)  12/17/20 236 lb 3.2 oz (107.1 kg)   Body mass index is 37.99 kg/m.  Patient counseled on low-carb modified diet She was encouraged to engage in moderate exercises at least 150 minutes weekly.  Working on getting zepound      Relevant Medications   ZEPBOUND 2.5 MG/0.5ML Pen   Chronic pain of right knee    Continue OTC Tylenol as needed Application of heat and ice encouraged  loose weight      Other Visit Diagnoses     Screening for endocrine, nutritional, metabolic and immunity disorder       Relevant Orders   Vitamin D, 25-hydroxy   Lipid Panel   TSH   Hemoglobin A1c       Outpatient Encounter Medications as of 02/09/2023  Medication Sig   Ascorbic Acid (VITAMIN C) 1000 MG tablet Take 1,000 mg by mouth daily.   cholecalciferol (VITAMIN D) 1000 UNITS tablet Take 1,000 Units by mouth daily.   Cod Liver Oil 5000-500 UNIT/5ML OIL Take by mouth.   nystatin-triamcinolone ointment (MYCOLOG) Apply 1 application topically 2 (two) times daily. Thin application on affected skin as needed.   WEGOVY 2.4 MG/0.75ML SOAJ SMARTSIG:0.75 Milliliter(s)  SUB-Q Once a Week (Patient not taking: Reported on 02/09/2023)   ZEPBOUND 2.5 MG/0.5ML Pen Inject 2.5 mg into the skin once a week. (Patient not taking: Reported on 02/09/2023)   No facility-administered encounter medications on file as of 02/09/2023.    Follow-up: Return in about 1 year (around 02/09/2024) for CPE.   Lauren Rival, FNP

## 2023-02-09 NOTE — Assessment & Plan Note (Signed)
Wt Readings from Last 3 Encounters:  02/09/23 246 lb 3.2 oz (111.7 kg)  12/27/21 234 lb (106.1 kg)  12/17/20 236 lb 3.2 oz (107.1 kg)   Body mass index is 37.99 kg/m.  Patient counseled on low-carb modified diet She was encouraged to engage in moderate exercises at least 150 minutes weekly.  Working on getting zepound

## 2023-02-09 NOTE — Assessment & Plan Note (Addendum)
Annual exam as documented.  Counseling done include healthy lifestyle involving committing to 150 minutes of exercise per week, heart healthy diet, and attaining healthy weight. The importance of adequate sleep also discussed.  Regular use of seat belt and home safety were also discussed . Changes in health habits are decided on by patient with goals and time frames set for achieving them. Immunization and cancer screening  needs are specifically addressed at this visit.   Patient encouraged to get shingles vaccine and pneumonia vaccine.  Up-to-date with flu vaccine and Tdap vaccine, colonoscopy and mammogram.  Will check fasting labs

## 2023-02-09 NOTE — Patient Instructions (Addendum)

## 2023-02-12 ENCOUNTER — Other Ambulatory Visit: Payer: Self-pay

## 2023-02-14 ENCOUNTER — Other Ambulatory Visit: Payer: BC Managed Care – PPO

## 2023-02-14 DIAGNOSIS — Z130 Encounter for screening for diseases of the blood and blood-forming organs and certain disorders involving the immune mechanism: Secondary | ICD-10-CM

## 2023-02-15 LAB — HEMOGLOBIN A1C
Est. average glucose Bld gHb Est-mCnc: 131 mg/dL
Hgb A1c MFr Bld: 6.2 % — ABNORMAL HIGH (ref 4.8–5.6)

## 2023-02-15 LAB — VITAMIN D 25 HYDROXY (VIT D DEFICIENCY, FRACTURES): Vit D, 25-Hydroxy: 30.1 ng/mL (ref 30.0–100.0)

## 2023-02-15 LAB — LIPID PANEL
Chol/HDL Ratio: 2.8 ratio (ref 0.0–4.4)
Cholesterol, Total: 216 mg/dL — ABNORMAL HIGH (ref 100–199)
HDL: 76 mg/dL (ref >39)
LDL Chol Calc (NIH): 125 mg/dL — ABNORMAL HIGH (ref 0–99)
Triglycerides: 88 mg/dL (ref 0–149)
VLDL Cholesterol Cal: 15 mg/dL (ref 5–40)

## 2023-02-15 LAB — TSH: TSH: 2.17 u[IU]/mL (ref 0.450–4.500)

## 2023-02-16 NOTE — Progress Notes (Signed)
Prediabetes avoid sugar sweets soda, lose weight.  Hyperlipidemia .eat a healthy diet, including lots of fruits and vegetables. Avoid foods with a lot of saturated and trans fats, such as red meat, butter, fried foods and cheese . Maintain a healthy weight.  Other labs are stable

## 2023-02-21 ENCOUNTER — Ambulatory Visit: Payer: BC Managed Care – PPO | Admitting: Obstetrics & Gynecology

## 2023-04-19 ENCOUNTER — Other Ambulatory Visit: Payer: Self-pay | Admitting: Obstetrics & Gynecology

## 2023-04-19 DIAGNOSIS — Z1231 Encounter for screening mammogram for malignant neoplasm of breast: Secondary | ICD-10-CM

## 2023-05-25 ENCOUNTER — Ambulatory Visit
Admission: RE | Admit: 2023-05-25 | Discharge: 2023-05-25 | Disposition: A | Discharge status: Home / Self Care | Payer: BC Managed Care – PPO | Source: Ambulatory Visit | Attending: Obstetrics & Gynecology | Admitting: Obstetrics & Gynecology

## 2023-05-25 DIAGNOSIS — Z1231 Encounter for screening mammogram for malignant neoplasm of breast: Secondary | ICD-10-CM

## 2023-06-12 ENCOUNTER — Telehealth: Payer: Self-pay | Admitting: Nurse Practitioner

## 2023-06-12 NOTE — Telephone Encounter (Signed)
Pt LVM on nurse line [phone] requesting nurse call back. No reason for call left on message.

## 2023-08-17 ENCOUNTER — Other Ambulatory Visit: Payer: Self-pay | Admitting: Nurse Practitioner

## 2023-08-17 DIAGNOSIS — Z1211 Encounter for screening for malignant neoplasm of colon: Secondary | ICD-10-CM

## 2023-08-17 DIAGNOSIS — Z1212 Encounter for screening for malignant neoplasm of rectum: Secondary | ICD-10-CM

## 2023-12-05 ENCOUNTER — Ambulatory Visit: Payer: Self-pay | Admitting: Nurse Practitioner

## 2024-01-28 ENCOUNTER — Other Ambulatory Visit: Payer: Self-pay | Admitting: Nurse Practitioner

## 2024-01-28 DIAGNOSIS — Z1231 Encounter for screening mammogram for malignant neoplasm of breast: Secondary | ICD-10-CM

## 2024-02-29 ENCOUNTER — Ambulatory Visit: Payer: BC Managed Care – PPO | Admitting: Internal Medicine

## 2024-05-09 ENCOUNTER — Encounter: Payer: Self-pay | Admitting: Nurse Practitioner

## 2024-05-30 ENCOUNTER — Ambulatory Visit: Payer: Self-pay

## 2024-06-13 ENCOUNTER — Ambulatory Visit
Admission: RE | Admit: 2024-06-13 | Discharge: 2024-06-13 | Disposition: A | Discharge status: Home / Self Care | Payer: Self-pay | Source: Ambulatory Visit | Attending: Nurse Practitioner | Admitting: Nurse Practitioner

## 2024-06-13 DIAGNOSIS — Z1231 Encounter for screening mammogram for malignant neoplasm of breast: Secondary | ICD-10-CM

## 2024-06-17 ENCOUNTER — Ambulatory Visit: Payer: Self-pay | Admitting: Nurse Practitioner

## 2024-06-18 ENCOUNTER — Encounter: Payer: Self-pay | Admitting: Nurse Practitioner

## 2024-06-18 ENCOUNTER — Ambulatory Visit (INDEPENDENT_AMBULATORY_CARE_PROVIDER_SITE_OTHER): Payer: Self-pay | Admitting: Nurse Practitioner

## 2024-06-18 VITALS — BP 116/64 | HR 76 | Temp 97.5°F | Wt 241.0 lb

## 2024-06-18 DIAGNOSIS — R7303 Prediabetes: Secondary | ICD-10-CM | POA: Insufficient documentation

## 2024-06-18 DIAGNOSIS — Z1329 Encounter for screening for other suspected endocrine disorder: Secondary | ICD-10-CM | POA: Diagnosis not present

## 2024-06-18 DIAGNOSIS — Z0000 Encounter for general adult medical examination without abnormal findings: Secondary | ICD-10-CM | POA: Diagnosis not present

## 2024-06-18 DIAGNOSIS — E66812 Obesity, class 2: Secondary | ICD-10-CM

## 2024-06-18 DIAGNOSIS — E785 Hyperlipidemia, unspecified: Secondary | ICD-10-CM | POA: Insufficient documentation

## 2024-06-18 DIAGNOSIS — Z1321 Encounter for screening for nutritional disorder: Secondary | ICD-10-CM

## 2024-06-18 DIAGNOSIS — Z13228 Encounter for screening for other metabolic disorders: Secondary | ICD-10-CM

## 2024-06-18 DIAGNOSIS — Z6837 Body mass index (BMI) 37.0-37.9, adult: Secondary | ICD-10-CM

## 2024-06-18 DIAGNOSIS — K30 Functional dyspepsia: Secondary | ICD-10-CM | POA: Insufficient documentation

## 2024-06-18 DIAGNOSIS — Z130 Encounter for screening for diseases of the blood and blood-forming organs and certain disorders involving the immune mechanism: Secondary | ICD-10-CM

## 2024-06-18 MED ORDER — FAMOTIDINE 20 MG PO TABS: 20.0000 mg | ORAL_TABLET | Freq: Two times a day (BID) | ORAL | 1 refills | Status: DC | PRN

## 2024-06-18 NOTE — Assessment & Plan Note (Signed)
 Lab Results  Component Value Date   HGBA1C 6.2 (H) 02/14/2023  Rechecking labs

## 2024-06-18 NOTE — Assessment & Plan Note (Addendum)
 Wt Readings from Last 3 Encounters:  06/18/24 241 lb (109.3 kg)  02/09/23 246 lb 3.2 oz (111.7 kg)  12/27/21 234 lb (106.1 kg)   Body mass index is 37.19 kg/m.  Patient counseled on low-carb diet Encouraged to engage in regular moderate exercises at least 150 minutes weekly as tolerated

## 2024-06-18 NOTE — Patient Instructions (Addendum)
 Please consider getting Shingrix, pneumococcal  and Tdap vaccine at local pharmacy.    Acid indigestion  - famotidine  (PEPCID ) 20 MG tablet; Take 1 tablet (20 mg total) by mouth 2 (two) times daily as needed for heartburn or indigestion.  Dispense: 60 tablet; Refill: 1    It is important that you exercise regularly at least 30 minutes 5 times a week as tolerated  Think about what you will eat, plan ahead. Choose  clean, green, fresh or frozen over canned, processed or packaged foods which are more sugary, salty and fatty. 70 to 75% of food eaten should be vegetables and fruit. Three meals at set times with snacks allowed between meals, but they must be fruit or vegetables. Aim to eat over a 12 hour period , example 7 am to 7 pm, and STOP after  your last meal of the day. Drink water,generally about 64 ounces per day, no other drink is as healthy. Fruit juice is best enjoyed in a healthy way, by EATING the fruit.  Thanks for choosing [facility] we consider it a privelige to serve you.

## 2024-06-18 NOTE — Assessment & Plan Note (Signed)
 Annual exam as documented.  Counseling done include healthy lifestyle involving committing to 150 minutes of exercise per week, heart healthy diet, and attaining healthy weight. Changes in health habits are decided on by patient with goals and time frames set for achieving them. Immunization and cancer screening  needs are specifically addressed at this visit.

## 2024-06-18 NOTE — Assessment & Plan Note (Signed)
 Advised to eat foods that triggers acid reflux symptoms , eats smaller portions of meal and easy to digest foods Retained material on indigestion provided - famotidine  (PEPCID ) 20 MG tablet; Take 1 tablet (20 mg total) by mouth 2 (two) times daily as needed for heartburn or indigestion.  Dispense: 60 tablet; Refill: 1  Follow-up in 1 month

## 2024-06-18 NOTE — Assessment & Plan Note (Signed)
 Lab Results  Component Value Date   CHOL 216 (H) 02/14/2023   HDL 76 02/14/2023   LDLCALC 125 (H) 02/14/2023   TRIG 88 02/14/2023   CHOLHDL 2.8 02/14/2023  Rechecking labs Avoid fatty fried foods, lose weight

## 2024-06-18 NOTE — Progress Notes (Signed)
 Complete physical exam  Patient: Lauren Gray   DOB: 12/23/1950   72 y.o. Female  MRN: [id number]  Subjective:    Chief Complaint  Patient presents with   Annual Exam    Lauren Gray is a 73 y.o. female  has a past medical history of Obesity and Scoliosis. who presents today for a complete physical exam. She reports consuming a general diet.  She goes to physical therapy for her scoliosis once every other week, does posture exercises at home.  She generally feels well   Stated that she had an episode that felt like she was choking after eating corn, okra and pork chops last week she ended up throwing up green and brown substances.  States that she sometimes has acid reflux and constipation if she does not drink enough water.  She denies fever, chills, unintentional weight loss, diarrhea, abdominal pain    Stated that she is up-to-date with colon cancer screening done by Dr. Rosalie. Last colonoscopy result available on Epic was from 2013. Need for shingles vaccine, Tdap vaccine and pneumococcal vaccines discussed, she plans to get the vaccines at the pharmacy.  Up-to-date with mammogram           Patient Care Team: Tinisha Etzkorn R, FNP as PCP - General (Nurse Practitioner)   Outpatient Medications Prior to Visit  Medication Sig   Ascorbic Acid (VITAMIN C) 1000 MG tablet Take 1,000 mg by mouth daily. (Patient not taking: Reported on 06/18/2024)   cholecalciferol (VITAMIN D ) 1000 UNITS tablet Take 1,000 Units by mouth daily. (Patient not taking: Reported on 06/18/2024)   Cod Liver Oil 5000-500 UNIT/5ML OIL Take by mouth. (Patient not taking: Reported on 06/18/2024)   nystatin -triamcinolone  ointment (MYCOLOG) Apply 1 application topically 2 (two) times daily. Thin application on affected skin as needed. (Patient not taking: Reported on 06/18/2024)   WEGOVY 2.4 MG/0.75ML SOAJ SMARTSIG:0.75 Milliliter(s) SUB-Q Once a Week (Patient not taking: Reported on 06/18/2024)   ZEPBOUND 2.5  MG/0.5ML Pen Inject 2.5 mg into the skin once a week. (Patient not taking: Reported on 06/18/2024)   No facility-administered medications prior to visit.    Review of Systems  Constitutional:  Negative for appetite change, chills, fatigue and fever.  HENT:  Negative for congestion, postnasal drip, rhinorrhea and sneezing.   Respiratory:  Negative for cough, shortness of breath and wheezing.   Cardiovascular:  Negative for chest pain, palpitations and leg swelling.  Gastrointestinal:  Negative for abdominal pain, constipation and nausea.  Genitourinary:  Negative for difficulty urinating, dysuria, flank pain and frequency.  Musculoskeletal:  Negative for arthralgias, back pain, joint swelling and myalgias.  Skin:  Negative for color change, pallor, rash and wound.  Neurological:  Negative for dizziness, facial asymmetry, weakness, numbness and headaches.  Psychiatric/Behavioral:  Negative for behavioral problems, confusion, self-injury and suicidal ideas.        Objective:     BP 116/64   Pulse 76   Temp (!) 97.5 F (36.4 C)   Wt 241 lb (109.3 kg)   SpO2 100%   BMI 37.19 kg/m    Physical Exam Vitals and nursing note reviewed. Exam conducted with a chaperone present.  Constitutional:      General: She is not in acute distress.    Appearance: Normal appearance. She is obese. She is not ill-appearing, toxic-appearing or diaphoretic.  HENT:     Right Ear: Tympanic membrane, ear canal and external ear normal. There is no impacted cerumen.  Left Ear: Tympanic membrane, ear canal and external ear normal. There is no impacted cerumen.     Nose: Nose normal. No congestion or rhinorrhea.     Mouth/Throat:     Mouth: Mucous membranes are moist.     Pharynx: Oropharynx is clear. No oropharyngeal exudate or posterior oropharyngeal erythema.  Eyes:     General: No scleral icterus.       Right eye: No discharge.        Left eye: No discharge.     Extraocular Movements: Extraocular  movements intact.     Conjunctiva/sclera: Conjunctivae normal.  Neck:     Vascular: No carotid bruit.  Cardiovascular:     Rate and Rhythm: Normal rate and regular rhythm.     Pulses: Normal pulses.     Heart sounds: Normal heart sounds. No murmur heard.    No friction rub. No gallop.  Pulmonary:     Effort: Pulmonary effort is normal. No respiratory distress.     Breath sounds: Normal breath sounds. No stridor. No wheezing, rhonchi or rales.  Chest:     Chest wall: No tenderness.  Abdominal:     General: Bowel sounds are normal. There is no distension.     Palpations: Abdomen is soft. There is no mass.     Tenderness: There is no abdominal tenderness. There is no right CVA tenderness, left CVA tenderness, guarding or rebound.     Hernia: No hernia is present.  Musculoskeletal:        General: No swelling, tenderness, deformity or signs of injury.     Cervical back: Normal range of motion and neck supple. No rigidity or tenderness.     Thoracic back: Scoliosis present.     Right lower leg: No edema.     Left lower leg: No edema.  Lymphadenopathy:     Cervical: No cervical adenopathy.  Skin:    General: Skin is warm and dry.     Capillary Refill: Capillary refill takes less than 2 seconds.     Coloration: Skin is not jaundiced or pale.     Findings: No bruising, erythema, lesion or rash.  Neurological:     Mental Status: She is alert and oriented to person, place, and time.     Cranial Nerves: No cranial nerve deficit.     Motor: No weakness.     Gait: Gait normal.  Psychiatric:        Mood and Affect: Mood normal.        Behavior: Behavior normal.        Thought Content: Thought content normal.        Judgment: Judgment normal.     No results found for any visits on 06/18/24.     Assessment & Plan:    Routine Health Maintenance and Physical Exam  Immunization History  Administered Date(s) Administered   Influenza Inj Mdck Quad Pf 08/08/2018   Influenza,inj,Quad  PF,6+ Mos 08/08/2017, 07/22/2019    Health Maintenance  Topic Date Due   DTaP/Tdap/Td (1 - Tdap) Never done   Pneumococcal Vaccine: 50+ Years (1 of 1 - PCV) Never done   Zoster Vaccines- Shingrix (1 of 2) Never done   Colonoscopy  02/18/2017   Cervical Cancer Screening (Pap smear)  10/02/2019   COVID-19 Vaccine (1 - 2024-25 season) Never done   INFLUENZA VACCINE  06/13/2024   MAMMOGRAM  06/13/2025   DEXA SCAN  Completed   Hepatitis C Screening  Completed   Hepatitis B  Vaccines  Aged Out   HPV VACCINES  Aged Out   Meningococcal B Vaccine  Aged Out    Discussed health benefits of physical activity, and encouraged her to engage in regular exercise appropriate for her age and condition.  Problem List Items Addressed This Visit       Other   Annual physical exam - Primary   Annual exam as documented.  Counseling done include healthy lifestyle involving committing to 150 minutes of exercise per week, heart healthy diet, and attaining healthy weight. Changes in health habits are decided on by patient with goals and time frames set for achieving them. Immunization and cancer screening  needs are specifically addressed at this visit.         Obesity   Wt Readings from Last 3 Encounters:  06/18/24 241 lb (109.3 kg)  02/09/23 246 lb 3.2 oz (111.7 kg)  12/27/21 234 lb (106.1 kg)   Body mass index is 37.19 kg/m.  Patient counseled on low-carb diet Encouraged to engage in regular moderate exercises at least 150 minutes weekly as tolerated      Prediabetes   Lab Results  Component Value Date   HGBA1C 6.2 (H) 02/14/2023  Rechecking labs      Relevant Orders   Hemoglobin A1c   Acid indigestion   Advised to eat foods that triggers acid reflux symptoms , eats smaller portions of meal and easy to digest foods Retained material on indigestion provided - famotidine  (PEPCID ) 20 MG tablet; Take 1 tablet (20 mg total) by mouth 2 (two) times daily as needed for heartburn or indigestion.   Dispense: 60 tablet; Refill: 1  Follow-up in 1 month      Relevant Medications   famotidine  (PEPCID ) 20 MG tablet   Hyperlipidemia   Lab Results  Component Value Date   CHOL 216 (H) 02/14/2023   HDL 76 02/14/2023   LDLCALC 125 (H) 02/14/2023   TRIG 88 02/14/2023   CHOLHDL 2.8 02/14/2023  Rechecking labs Avoid fatty fried foods, lose weight      Relevant Orders   Lipid panel   Other Visit Diagnoses       Screening for endocrine, nutritional, metabolic and immunity disorder       Relevant Orders   CBC   CMP14+EGFR   Hemoglobin A1c      Return in about 4 weeks (around 07/16/2024), or Indigestion.     Grete Bosko R Audrena Talaga, FNP

## 2024-06-19 LAB — CMP14+EGFR
ALT: 12 IU/L (ref 0–32)
AST: 11 IU/L (ref 0–40)
Albumin: 4.3 g/dL (ref 3.8–4.8)
Alkaline Phosphatase: 87 IU/L (ref 44–121)
BUN/Creatinine Ratio: 11 — ABNORMAL LOW (ref 12–28)
BUN: 14 mg/dL (ref 8–27)
Bilirubin Total: 0.4 mg/dL (ref 0.0–1.2)
CO2: 20 mmol/L (ref 20–29)
Calcium: 9.6 mg/dL (ref 8.7–10.3)
Chloride: 104 mmol/L (ref 96–106)
Creatinine, Ser: 1.27 mg/dL — ABNORMAL HIGH (ref 0.57–1.00)
Globulin, Total: 2.9 g/dL (ref 1.5–4.5)
Glucose: 104 mg/dL — ABNORMAL HIGH (ref 70–99)
Potassium: 4.4 mmol/L (ref 3.5–5.2)
Sodium: 142 mmol/L (ref 134–144)
Total Protein: 7.2 g/dL (ref 6.0–8.5)
eGFR: 45 mL/min/1.73 — ABNORMAL LOW (ref >59)

## 2024-06-19 LAB — HEMOGLOBIN A1C
Est. average glucose Bld gHb Est-mCnc: 128 mg/dL
Hgb A1c MFr Bld: 6.1 % — ABNORMAL HIGH (ref 4.8–5.6)

## 2024-06-19 LAB — CBC
Hematocrit: 41.2 % (ref 34.0–46.6)
Hemoglobin: 13.5 g/dL (ref 11.1–15.9)
MCH: 31.4 pg (ref 26.6–33.0)
MCHC: 32.8 g/dL (ref 31.5–35.7)
MCV: 96 fL (ref 79–97)
Platelets: 329 x10E3/uL (ref 150–450)
RBC: 4.3 x10E6/uL (ref 3.77–5.28)
RDW: 12.7 % (ref 11.7–15.4)
WBC: 5 x10E3/uL (ref 3.4–10.8)

## 2024-06-19 LAB — LIPID PANEL
Chol/HDL Ratio: 3.1 ratio (ref 0.0–4.4)
Cholesterol, Total: 221 mg/dL — ABNORMAL HIGH (ref 100–199)
HDL: 71 mg/dL (ref >39)
LDL Chol Calc (NIH): 134 mg/dL — ABNORMAL HIGH (ref 0–99)
Triglycerides: 92 mg/dL (ref 0–149)
VLDL Cholesterol Cal: 16 mg/dL (ref 5–40)

## 2024-06-20 ENCOUNTER — Ambulatory Visit: Payer: Self-pay | Admitting: Nurse Practitioner

## 2024-06-20 ENCOUNTER — Other Ambulatory Visit: Payer: Self-pay | Admitting: Nurse Practitioner

## 2024-06-23 ENCOUNTER — Telehealth: Payer: Self-pay

## 2024-06-23 ENCOUNTER — Other Ambulatory Visit: Payer: Self-pay | Admitting: Nurse Practitioner

## 2024-06-23 DIAGNOSIS — R110 Nausea: Secondary | ICD-10-CM

## 2024-06-23 DIAGNOSIS — K30 Functional dyspepsia: Secondary | ICD-10-CM

## 2024-06-23 DIAGNOSIS — R112 Nausea with vomiting, unspecified: Secondary | ICD-10-CM

## 2024-06-23 MED ORDER — ONDANSETRON 4 MG PO TBDP: 4.0000 mg | ORAL_TABLET | Freq: Three times a day (TID) | ORAL | 0 refills | Status: DC | PRN

## 2024-06-23 NOTE — Telephone Encounter (Signed)
 Copied from CRM [id number]. Topic: Clinical - Lab/Test Results >> Jun 23, 2024  2:52 PM Kevelyn M wrote: Reason for CRM: Gave patient lab results/patient wanted to find out what was causing her to throw-up. She's asking about her liver and pancreas. Patient threw up meal on Saturday. Had a grilled pork chop, 2 tablespoons of collard greens, and plain baked potato with pepper. Please advise: Call back: [phone]

## 2024-06-25 LAB — SPECIMEN STATUS REPORT

## 2024-06-25 LAB — LIPASE: Lipase: 26 U/L (ref 14–85)

## 2024-07-02 ENCOUNTER — Ambulatory Visit
Admission: RE | Admit: 2024-07-02 | Discharge: 2024-07-02 | Disposition: A | Discharge status: Home / Self Care | Source: Ambulatory Visit

## 2024-07-02 ENCOUNTER — Other Ambulatory Visit: Payer: Self-pay

## 2024-07-02 DIAGNOSIS — R112 Nausea with vomiting, unspecified: Secondary | ICD-10-CM

## 2024-07-02 DIAGNOSIS — K5900 Constipation, unspecified: Secondary | ICD-10-CM

## 2024-07-04 ENCOUNTER — Emergency Department (HOSPITAL_COMMUNITY)

## 2024-07-04 ENCOUNTER — Other Ambulatory Visit: Payer: Self-pay

## 2024-07-04 ENCOUNTER — Inpatient Hospital Stay (HOSPITAL_COMMUNITY)
Admission: EM | Admit: 2024-07-04 | Discharge: 2024-08-15 | DRG: 356 | Disposition: A | Discharge status: Rehabilitation Facility | Attending: Family Medicine | Admitting: Family Medicine

## 2024-07-04 DIAGNOSIS — F4321 Adjustment disorder with depressed mood: Secondary | ICD-10-CM | POA: Diagnosis not present

## 2024-07-04 DIAGNOSIS — J90 Pleural effusion, not elsewhere classified: Secondary | ICD-10-CM | POA: Diagnosis present

## 2024-07-04 DIAGNOSIS — Z6841 Body Mass Index (BMI) 40.0 and over, adult: Secondary | ICD-10-CM

## 2024-07-04 DIAGNOSIS — R748 Abnormal levels of other serum enzymes: Secondary | ICD-10-CM | POA: Diagnosis not present

## 2024-07-04 DIAGNOSIS — F419 Anxiety disorder, unspecified: Secondary | ICD-10-CM | POA: Diagnosis present

## 2024-07-04 DIAGNOSIS — R32 Unspecified urinary incontinence: Secondary | ICD-10-CM | POA: Diagnosis not present

## 2024-07-04 DIAGNOSIS — E46 Unspecified protein-calorie malnutrition: Secondary | ICD-10-CM | POA: Diagnosis not present

## 2024-07-04 DIAGNOSIS — K2100 Gastro-esophageal reflux disease with esophagitis, without bleeding: Secondary | ICD-10-CM | POA: Diagnosis present

## 2024-07-04 DIAGNOSIS — E876 Hypokalemia: Secondary | ICD-10-CM | POA: Diagnosis present

## 2024-07-04 DIAGNOSIS — K311 Adult hypertrophic pyloric stenosis: Secondary | ICD-10-CM | POA: Diagnosis present

## 2024-07-04 DIAGNOSIS — E11649 Type 2 diabetes mellitus with hypoglycemia without coma: Secondary | ICD-10-CM | POA: Diagnosis not present

## 2024-07-04 DIAGNOSIS — Z8049 Family history of malignant neoplasm of other genital organs: Secondary | ICD-10-CM

## 2024-07-04 DIAGNOSIS — N179 Acute kidney failure, unspecified: Secondary | ICD-10-CM | POA: Diagnosis present

## 2024-07-04 DIAGNOSIS — R9431 Abnormal electrocardiogram [ECG] [EKG]: Secondary | ICD-10-CM | POA: Diagnosis present

## 2024-07-04 DIAGNOSIS — E440 Moderate protein-calorie malnutrition: Secondary | ICD-10-CM | POA: Diagnosis not present

## 2024-07-04 DIAGNOSIS — E860 Dehydration: Secondary | ICD-10-CM | POA: Diagnosis present

## 2024-07-04 DIAGNOSIS — E43 Unspecified severe protein-calorie malnutrition: Secondary | ICD-10-CM | POA: Diagnosis present

## 2024-07-04 DIAGNOSIS — N1831 Chronic kidney disease, stage 3a: Secondary | ICD-10-CM | POA: Diagnosis present

## 2024-07-04 DIAGNOSIS — K315 Obstruction of duodenum: Secondary | ICD-10-CM | POA: Diagnosis present

## 2024-07-04 DIAGNOSIS — I129 Hypertensive chronic kidney disease with stage 1 through stage 4 chronic kidney disease, or unspecified chronic kidney disease: Secondary | ICD-10-CM | POA: Diagnosis present

## 2024-07-04 DIAGNOSIS — I10 Essential (primary) hypertension: Secondary | ICD-10-CM | POA: Diagnosis not present

## 2024-07-04 DIAGNOSIS — R112 Nausea with vomiting, unspecified: Secondary | ICD-10-CM | POA: Diagnosis present

## 2024-07-04 DIAGNOSIS — Z90710 Acquired absence of both cervix and uterus: Secondary | ICD-10-CM

## 2024-07-04 DIAGNOSIS — E8809 Other disorders of plasma-protein metabolism, not elsewhere classified: Secondary | ICD-10-CM | POA: Diagnosis present

## 2024-07-04 DIAGNOSIS — E785 Hyperlipidemia, unspecified: Secondary | ICD-10-CM | POA: Diagnosis present

## 2024-07-04 DIAGNOSIS — Z8249 Family history of ischemic heart disease and other diseases of the circulatory system: Secondary | ICD-10-CM

## 2024-07-04 DIAGNOSIS — K56609 Unspecified intestinal obstruction, unspecified as to partial versus complete obstruction: Principal | ICD-10-CM | POA: Diagnosis present

## 2024-07-04 DIAGNOSIS — K567 Ileus, unspecified: Secondary | ICD-10-CM | POA: Diagnosis not present

## 2024-07-04 DIAGNOSIS — Z79899 Other long term (current) drug therapy: Secondary | ICD-10-CM

## 2024-07-04 DIAGNOSIS — C170 Malignant neoplasm of duodenum: Principal | ICD-10-CM | POA: Diagnosis present

## 2024-07-04 DIAGNOSIS — R609 Edema, unspecified: Secondary | ICD-10-CM | POA: Diagnosis not present

## 2024-07-04 DIAGNOSIS — D62 Acute posthemorrhagic anemia: Secondary | ICD-10-CM | POA: Diagnosis not present

## 2024-07-04 DIAGNOSIS — Z885 Allergy status to narcotic agent status: Secondary | ICD-10-CM

## 2024-07-04 DIAGNOSIS — E870 Hyperosmolality and hypernatremia: Secondary | ICD-10-CM | POA: Diagnosis present

## 2024-07-04 DIAGNOSIS — E872 Acidosis, unspecified: Secondary | ICD-10-CM | POA: Diagnosis not present

## 2024-07-04 DIAGNOSIS — B961 Klebsiella pneumoniae [K. pneumoniae] as the cause of diseases classified elsewhere: Secondary | ICD-10-CM | POA: Diagnosis not present

## 2024-07-04 DIAGNOSIS — K449 Diaphragmatic hernia without obstruction or gangrene: Secondary | ICD-10-CM | POA: Diagnosis not present

## 2024-07-04 DIAGNOSIS — B965 Pseudomonas (aeruginosa) (mallei) (pseudomallei) as the cause of diseases classified elsewhere: Secondary | ICD-10-CM | POA: Diagnosis not present

## 2024-07-04 DIAGNOSIS — T8131XD Disruption of external operation (surgical) wound, not elsewhere classified, subsequent encounter: Secondary | ICD-10-CM | POA: Diagnosis not present

## 2024-07-04 DIAGNOSIS — Z9079 Acquired absence of other genital organ(s): Secondary | ICD-10-CM

## 2024-07-04 DIAGNOSIS — N182 Chronic kidney disease, stage 2 (mild): Secondary | ICD-10-CM | POA: Diagnosis not present

## 2024-07-04 DIAGNOSIS — E1165 Type 2 diabetes mellitus with hyperglycemia: Secondary | ICD-10-CM | POA: Diagnosis present

## 2024-07-04 DIAGNOSIS — K651 Peritoneal abscess: Secondary | ICD-10-CM | POA: Diagnosis not present

## 2024-07-04 DIAGNOSIS — E1122 Type 2 diabetes mellitus with diabetic chronic kidney disease: Secondary | ICD-10-CM | POA: Diagnosis present

## 2024-07-04 DIAGNOSIS — K5900 Constipation, unspecified: Secondary | ICD-10-CM | POA: Insufficient documentation

## 2024-07-04 DIAGNOSIS — J9601 Acute respiratory failure with hypoxia: Secondary | ICD-10-CM | POA: Diagnosis not present

## 2024-07-04 DIAGNOSIS — Z23 Encounter for immunization: Secondary | ICD-10-CM

## 2024-07-04 DIAGNOSIS — K8689 Other specified diseases of pancreas: Secondary | ICD-10-CM | POA: Diagnosis present

## 2024-07-04 DIAGNOSIS — R5381 Other malaise: Secondary | ICD-10-CM | POA: Diagnosis not present

## 2024-07-04 DIAGNOSIS — E66812 Obesity, class 2: Secondary | ICD-10-CM | POA: Diagnosis not present

## 2024-07-04 DIAGNOSIS — M419 Scoliosis, unspecified: Secondary | ICD-10-CM | POA: Diagnosis present

## 2024-07-04 DIAGNOSIS — E66813 Obesity, class 3: Secondary | ICD-10-CM | POA: Diagnosis present

## 2024-07-04 DIAGNOSIS — T8143XA Infection following a procedure, organ and space surgical site, initial encounter: Secondary | ICD-10-CM | POA: Diagnosis not present

## 2024-07-04 DIAGNOSIS — D638 Anemia in other chronic diseases classified elsewhere: Secondary | ICD-10-CM | POA: Diagnosis not present

## 2024-07-04 DIAGNOSIS — R7989 Other specified abnormal findings of blood chemistry: Secondary | ICD-10-CM | POA: Diagnosis present

## 2024-07-04 DIAGNOSIS — Z90722 Acquired absence of ovaries, bilateral: Secondary | ICD-10-CM

## 2024-07-04 DIAGNOSIS — Z91040 Latex allergy status: Secondary | ICD-10-CM

## 2024-07-04 DIAGNOSIS — D75839 Thrombocytosis, unspecified: Secondary | ICD-10-CM | POA: Diagnosis not present

## 2024-07-04 DIAGNOSIS — Z833 Family history of diabetes mellitus: Secondary | ICD-10-CM

## 2024-07-04 DIAGNOSIS — B370 Candidal stomatitis: Secondary | ICD-10-CM | POA: Diagnosis not present

## 2024-07-04 DIAGNOSIS — R000 Tachycardia, unspecified: Secondary | ICD-10-CM | POA: Diagnosis not present

## 2024-07-04 DIAGNOSIS — J189 Pneumonia, unspecified organism: Secondary | ICD-10-CM | POA: Diagnosis present

## 2024-07-04 DIAGNOSIS — R7401 Elevation of levels of liver transaminase levels: Secondary | ICD-10-CM | POA: Diagnosis present

## 2024-07-04 DIAGNOSIS — G4700 Insomnia, unspecified: Secondary | ICD-10-CM | POA: Diagnosis present

## 2024-07-04 DIAGNOSIS — F439 Reaction to severe stress, unspecified: Secondary | ICD-10-CM | POA: Diagnosis not present

## 2024-07-04 HISTORY — DX: Other complications of anesthesia, initial encounter: T88.59XA

## 2024-07-04 LAB — COMPREHENSIVE METABOLIC PANEL WITH GFR
ALT: 18 U/L (ref 0–44)
AST: 22 U/L (ref 15–41)
Albumin: 3.6 g/dL (ref 3.5–5.0)
Alkaline Phosphatase: 61 U/L (ref 38–126)
Anion gap: 9 (ref 5–15)
BUN: 17 mg/dL (ref 8–23)
CO2: 29 mmol/L (ref 22–32)
Calcium: 9.2 mg/dL (ref 8.9–10.3)
Chloride: 104 mmol/L (ref 98–111)
Creatinine, Ser: 1.35 mg/dL — ABNORMAL HIGH (ref 0.44–1.00)
GFR, Estimated: 42 mL/min — ABNORMAL LOW (ref >60)
Glucose, Bld: 131 mg/dL — ABNORMAL HIGH (ref 70–99)
Potassium: 2.9 mmol/L — ABNORMAL LOW (ref 3.5–5.1)
Sodium: 142 mmol/L (ref 135–145)
Total Bilirubin: 0.8 mg/dL (ref 0.0–1.2)
Total Protein: 7.8 g/dL (ref 6.5–8.1)

## 2024-07-04 LAB — CBC WITH DIFFERENTIAL/PLATELET
Abs Immature Granulocytes: 0.01 K/uL (ref 0.00–0.07)
Basophils Absolute: 0 K/uL (ref 0.0–0.1)
Basophils Relative: 0 %
Eosinophils Absolute: 0.1 K/uL (ref 0.0–0.5)
Eosinophils Relative: 2 %
HCT: 38.7 % (ref 36.0–46.0)
Hemoglobin: 12.6 g/dL (ref 12.0–15.0)
Immature Granulocytes: 0 %
Lymphocytes Relative: 42 %
Lymphs Abs: 1.9 K/uL (ref 0.7–4.0)
MCH: 30.4 pg (ref 26.0–34.0)
MCHC: 32.6 g/dL (ref 30.0–36.0)
MCV: 93.3 fL (ref 80.0–100.0)
Monocytes Absolute: 0.3 K/uL (ref 0.1–1.0)
Monocytes Relative: 7 %
Neutro Abs: 2.3 K/uL (ref 1.7–7.7)
Neutrophils Relative %: 49 %
Platelets: 298 K/uL (ref 150–400)
RBC: 4.15 MIL/uL (ref 3.87–5.11)
RDW: 12.4 % (ref 11.5–15.5)
WBC: 4.6 K/uL (ref 4.0–10.5)
nRBC: 0 % (ref 0.0–0.2)

## 2024-07-04 LAB — MAGNESIUM: Magnesium: 2.3 mg/dL (ref 1.7–2.4)

## 2024-07-04 LAB — LIPASE, BLOOD: Lipase: 58 U/L — ABNORMAL HIGH (ref 11–51)

## 2024-07-04 MED ORDER — POTASSIUM CHLORIDE 10 MEQ/100ML IV SOLN
10.0000 meq | INTRAVENOUS | Status: AC
  Administered 2024-07-04 (×2): 10 meq via INTRAVENOUS
  Filled 2024-07-04 (×3): qty 100

## 2024-07-04 MED ORDER — IOHEXOL 300 MG/ML  SOLN
80.0000 mL | Freq: Once | INTRAMUSCULAR | Status: AC | PRN
  Administered 2024-07-04: 80 mL via INTRAVENOUS

## 2024-07-04 MED ORDER — LACTATED RINGERS IV SOLN: INTRAVENOUS | Status: AC

## 2024-07-04 MED ORDER — LACTATED RINGERS IV BOLUS
1000.0000 mL | Freq: Once | INTRAVENOUS | Status: AC
  Administered 2024-07-04: 1000 mL via INTRAVENOUS

## 2024-07-04 MED ORDER — HYDRALAZINE HCL 20 MG/ML IJ SOLN
5.0000 mg | Freq: Four times a day (QID) | INTRAMUSCULAR | Status: DC | PRN
  Administered 2024-07-04: 5 mg via INTRAVENOUS
  Filled 2024-07-04: qty 1

## 2024-07-04 MED ORDER — POTASSIUM CHLORIDE 10 MEQ/100ML IV SOLN
10.0000 meq | INTRAVENOUS | Status: AC
  Administered 2024-07-04 – 2024-07-05 (×2): 10 meq via INTRAVENOUS
  Filled 2024-07-04 (×2): qty 100

## 2024-07-04 MED ORDER — ONDANSETRON HCL 4 MG/2ML IJ SOLN
4.0000 mg | Freq: Four times a day (QID) | INTRAMUSCULAR | Status: DC | PRN
  Administered 2024-07-06 – 2024-07-09 (×5): 4 mg via INTRAVENOUS
  Filled 2024-07-04 (×5): qty 2

## 2024-07-04 NOTE — ED Provider Notes (Signed)
 [place] [facility] AT [facility] Provider Note   CSN: [id number] Arrival date & time: 07/04/24  1445     Patient presents with: Emesis   Lauren Gray is a 73 y.o. female.   HPI     73 year old female with a history of scoliosis, hysterectomy who presents with concern for vomiting for 3 weeks.  Can't keep down food BM hard rocks Started about 3 weeks ago, ate some food that made her sick and has been sick ever since then Not eating, when eat throws up Eats saltine crackers, pepsi during day anda ble to tolerate this. However, at night tries to eat-will initially stay down and then when lays down starts to vomit and will throw up everything Last night was up and down all night  Has been throwing up all her regular meals for the past 3 weeks No fever Passing flatus, having hard small stool No urinary symptoms Does not feel nauseas until she eats No headache, Denies numbness, weakness, difficulty talking or walking, visual changes or facial droop.  Past Medical History:  Diagnosis Date   Obesity    Scoliosis     Past Surgical History:  Procedure Laterality Date   ABDOMINAL HYSTERECTOMY     TVH   PELVIC LAPAROSCOPY  04/06/2006   BSO     Prior to Admission medications   Medication Sig Start Date End Date Taking? Authorizing Provider  famotidine  (PEPCID ) 20 MG tablet Take 1 tablet (20 mg total) by mouth 2 (two) times daily as needed for heartburn or indigestion. 06/18/24  Yes Paseda, Folashade R, FNP  omeprazole (PRILOSEC) 40 MG capsule Take 40 mg by mouth daily. Half hour before morning meal 07/01/24  Yes [provider]  ondansetron  (ZOFRAN -ODT) 4 MG disintegrating tablet Take 1 tablet (4 mg total) by mouth every 8 (eight) hours as needed for nausea or vomiting. 06/23/24  Yes Paseda, Folashade R, FNP    Allergies: Codeine and Latex    Review of Systems  Updated Vital Signs BP (!) 159/65 (BP Location: Left Arm)   Pulse 71   Temp 98.2  F (36.8 C)   Resp 16   SpO2 98%   Physical Exam Vitals and nursing note reviewed.  Constitutional:      General: She is not in acute distress.    Appearance: She is well-developed. She is not diaphoretic.  HENT:     Head: Normocephalic and atraumatic.  Eyes:     Conjunctiva/sclera: Conjunctivae normal.  Cardiovascular:     Rate and Rhythm: Normal rate and regular rhythm.     Heart sounds: Normal heart sounds. No murmur heard.    No friction rub. No gallop.  Pulmonary:     Effort: Pulmonary effort is normal. No respiratory distress.     Breath sounds: Normal breath sounds. No wheezing or rales.  Abdominal:     General: There is no distension.     Palpations: Abdomen is soft.     Tenderness: There is no abdominal tenderness. There is no guarding.  Musculoskeletal:        General: No tenderness.     Cervical back: Normal range of motion.  Skin:    General: Skin is warm and dry.     Findings: No erythema or rash.  Neurological:     Mental Status: She is alert and oriented to person, place, and time.     (all labs ordered are listed, but only abnormal results are displayed) Labs Reviewed  LIPASE, BLOOD - Abnormal; Notable for the following components:      Result Value   Lipase 58 (*)    All other components within normal limits  COMPREHENSIVE METABOLIC PANEL WITH GFR - Abnormal; Notable for the following components:   Potassium 2.9 (*)    Glucose, Bld 131 (*)    Creatinine, Ser 1.35 (*)    GFR, Estimated 42 (*)    All other components within normal limits  CBC - Abnormal; Notable for the following components:   RBC 3.79 (*)    Hemoglobin 11.5 (*)    HCT 35.3 (*)    All other components within normal limits  COMPREHENSIVE METABOLIC PANEL WITH GFR - Abnormal; Notable for the following components:   Potassium 3.0 (*)    Glucose, Bld 103 (*)    Creatinine, Ser 1.10 (*)    Calcium 8.5 (*)    Total Protein 6.2 (*)    Albumin 3.0 (*)    GFR, Estimated 53 (*)    All  other components within normal limits  CBC WITH DIFFERENTIAL/PLATELET  MAGNESIUM  URINALYSIS, ROUTINE W REFLEX MICROSCOPIC    EKG: None  Radiology: CT ABDOMEN PELVIS W CONTRAST Result Date: 07/04/2024 CLINICAL DATA:  Abdominal pain, acute, nonlocalized stated she can only hold down crackers and yogurt, but still vomits. EXAM: CT ABDOMEN AND PELVIS WITH CONTRAST TECHNIQUE: Multidetector CT imaging of the abdomen and pelvis was performed using the standard protocol following bolus administration of intravenous contrast. RADIATION DOSE REDUCTION: This exam was performed according to the departmental dose-optimization program which includes automated exposure control, adjustment of the mA and/or kV according to patient size and/or use of iterative reconstruction technique. CONTRAST:  80mL OMNIPAQUE  IOHEXOL  300 MG/ML  SOLN COMPARISON:  CT abdomen pelvis 06/04/2007 FINDINGS: Lower chest: At least small volume hiatal hernia. No acute abnormality. Hepatobiliary: Pericentimeter fluid dense lesion of the left hepatic lobe likely a simple hepatic cyst. Nonspecific calcification of the right hepatic lobe (2:20). No gallstones, gallbladder wall thickening, or pericholecystic fluid. No biliary dilatation. Pancreas: No focal lesion. Normal pancreatic contour. No surrounding inflammatory changes. No main pancreatic ductal dilatation. Spleen: Normal in size without focal abnormality. Adrenals/Urinary Tract: No adrenal nodule bilaterally. Bilateral kidneys enhance symmetrically. No hydronephrosis. No hydroureter. The urinary bladder is unremarkable. Stomach/Bowel: Stomach is within normal limits. No evidence of large bowel wall thickening or dilatation. Fluid dilatation of the first and second portion of the duodenum measuring up to 3.3 cm. Under distended third portion of the duodenum and small bowel distally. Question transition point of the third portion of the duodenum with limited evaluation for bowel wall thickening  (7:71). Colonic diverticulosis. Appendix appears normal. Vascular/Lymphatic: No abdominal aorta or iliac aneurysm. Moderate atherosclerotic plaque of the aorta and its branches. No abdominal, pelvic, or inguinal lymphadenopathy. Reproductive: Status post hysterectomy. No adnexal masses. Other: No intraperitoneal free fluid. No intraperitoneal free gas. No organized fluid collection. Musculoskeletal: No abdominal wall hernia or abnormality. No suspicious lytic or blastic osseous lesions. No acute displaced fracture. L3-L4 intervertebral disc space vacuum phenomenon, endplate sclerosis, subchondral degenerative cysts. IMPRESSION: 1. At least small volume hiatal hernia. 2. Fluid dilatation of the first and second portion of the duodenum measuring up to 3.3 cm with under distended third portion of the duodenum and collapsed small bowel distally. Question transition point of the third portion of the duodenum with limited evaluation. Question small bowel obstruction with underlying mass not fully excluded. No bowel perforation. No inflammatory changes of the bowel noted.  3. Colonic diverticulosis with no acute diverticulitis. 4.  Aortic Atherosclerosis (ICD10-I70.0). Electronically Signed   By: Morgane  Naveau M.D.   On: 07/04/2024 20:35     Procedures   Medications Ordered in the ED  potassium chloride  10 mEq in 100 mL IVPB (0 mEq Intravenous Stopped 07/04/24 2256)  ondansetron  (ZOFRAN ) injection 4 mg (has no administration in time range)  hydrALAZINE  (APRESOLINE ) injection 5 mg (5 mg Intravenous Given 07/04/24 2235)  lactated ringers  infusion ( Intravenous New Bag/Given 07/04/24 2240)  hydrALAZINE  (APRESOLINE ) injection 10 mg (10 mg Intravenous Not Given 07/05/24 0144)  iohexol  (OMNIPAQUE ) 300 MG/ML solution 80 mL (80 mLs Intravenous Contrast Given 07/04/24 1959)  lactated ringers  bolus 1,000 mL (0 mLs Intravenous Stopped 07/04/24 2228)  potassium chloride  10 mEq in 100 mL IVPB (10 mEq Intravenous New Bag/Given  07/05/24 0054)                                    Medical Decision Making Amount and/or Complexity of Data Reviewed Labs: ordered.  Risk Prescription drug management. Decision regarding hospitalization.   73 year old female with a history of scoliosis, hysterectomy who presents with concern for vomiting for 3 weeks.  DDx includes appendicitis, pancreatitis, cholecystitis, pyelonephritis, nephrolithiasis, diverticulitis, SBO, colonic obstruction, gastric outlet obstruction, DKA, intracranial etiology of emesis.   Labs completed and personally by interpreted by me show hypokalemia with a potassium of 2.9, creatinine 1.35 from 1.27, mildly elevated lipase but not consistent with pancreatitis, no transaminitis.  No anemia, leukocytosis.  CT completed shows fluid dilation of the 1st and 2nd portion of the duodenum measuring up to 3.3 cm with underdistended third portion of the duodenum and collapsed small bowel distally.  Question transition point of the third portion of the duodenum with limited evaluation, possible small bowel obstruction with underlying mass not fully excluded.  Ordered IV fluids, IV K.   Sent message to on call physician with [facility]. Consulted General surgery. Admitted to hospitalist service.     Final diagnoses:  Nausea and vomiting, unspecified vomiting type  SBO (small bowel obstruction) [facility])    ED Discharge Orders     None          Dreama Longs, MD 07/05/24 1146

## 2024-07-04 NOTE — ED Triage Notes (Signed)
 Pt came in for vomiting for almost a month. Pt stated she can only hold down crackers and yogurt, but still vomits. Pt went to her GI doctor to get xray's of her abdomen and was told that she might be constipated. Pt has not had a full bowel movement in a few weeks.

## 2024-07-04 NOTE — H&P (Signed)
 History and Physical    Lauren Gray FMW:[id number] DOB: 12/27/1950 DOA: 07/04/2024  PCP: Paseda, Folashade R, FNP Patient coming from: home  I have personally briefly reviewed patient's old medical records in [facility]  Chief Complaint: N/v  HPI: Lauren Gray is a 73 y.o. female with medical history significant of HLD, prediabetes, CKDII does not take any medication on regular basis,  past surgical h/o S/P total hysterectomy and BSO with  presents with vomiting x 1 month, can only hold down crackers and yogurt, has not had a full bowel movement in a few weeks, last vomited this morning, last bm with small pebbles this morning, +flatus, no ab pain, + weight loss of 10lbs in the last months.  No fever, no sob, no chest pain. Never smoker, does not drink, does not use drugs  ED Course:   Data reviewed: Blood pressure (!) 187/66, pulse 78, temperature 98.2 F (36.8 C), temperature source Oral, resp. rate 17, SpO2 100%.  Medications Ordered in the ED  potassium chloride  10 mEq in 100 mL IVPB (10 mEq Intravenous New Bag/Given 07/04/24 2133)  iohexol  (OMNIPAQUE ) 300 MG/ML solution 80 mL (80 mLs Intravenous Contrast Given 07/04/24 1959)  lactated ringers  bolus 1,000 mL (1,000 mLs Intravenous New Bag/Given 07/04/24 2115)      Review of Systems: As per HPI otherwise all other systems reviewed and are negative.   Past Medical History:  Diagnosis Date   Obesity    Scoliosis     Past Surgical History:  Procedure Laterality Date   ABDOMINAL HYSTERECTOMY     TVH   PELVIC LAPAROSCOPY  04/06/2006   BSO    Social History  reports that she has never smoked. She has never used smokeless tobacco. She reports that she does not currently use alcohol. She reports that she does not use drugs.  Allergies  Allergen Reactions   Latex    Codeine Anxiety    i don't feel right at all    Family History  Problem Relation Age of Onset   Cancer Mother        ENDOMETRIOD OF OVARY    Diabetes Father    Heart disease Father    Breast cancer Sister 79   Aneurysm Sister    Diabetes Paternal Grandmother     Prior to Admission medications   Medication Sig Start Date End Date Taking? Authorizing Provider  Ascorbic Acid (VITAMIN C) 1000 MG tablet Take 1,000 mg by mouth daily. Patient not taking: Reported on 06/18/2024    [provider]  cholecalciferol (VITAMIN D ) 1000 UNITS tablet Take 1,000 Units by mouth daily. Patient not taking: Reported on 06/18/2024    [provider]  [facility] Liver Oil 5000-500 UNIT/5ML OIL Take by mouth. Patient not taking: Reported on 06/18/2024    [provider]  famotidine  (PEPCID ) 20 MG tablet Take 1 tablet (20 mg total) by mouth 2 (two) times daily as needed for heartburn or indigestion. 06/18/24   Paseda, Folashade R, FNP  nystatin -triamcinolone  ointment (MYCOLOG) Apply 1 application topically 2 (two) times daily. Thin application on affected skin as needed. Patient not taking: Reported on 06/18/2024 12/27/21   Lavoie, Marie-Lyne, MD  ondansetron  (ZOFRAN -ODT) 4 MG disintegrating tablet Take 1 tablet (4 mg total) by mouth every 8 (eight) hours as needed for nausea or vomiting. 06/23/24   Paseda, Folashade R, FNP    Physical Exam: Vitals:   07/04/24 1515 07/04/24 1758 07/04/24 2015 07/04/24 2130  BP: (!) 183/88 ROLLEN)  171/77 (!) 187/66 (!) 164/70  Pulse: 78 66 78 71  Resp: 18 18 17 17   Temp: 97.7 F (36.5 C) 98.2 F (36.8 C)    TempSrc: Oral Oral    SpO2: 98% 100% 100% 100%    Constitutional: NAD, calm, comfortable Eyes: PERRL, lids and conjunctivae normal ENMT: Mucous membranes are dry Respiratory: clear to auscultation bilaterally, no wheezing, no crackles. Normal respiratory effort. No accessory muscle use.  Cardiovascular: Regular rate and rhythm,  No extremity edema. 2+ pedal pulses. No carotid bruits.  Abdomen: no tenderness, not distended, Bowel sounds positive.  Musculoskeletal: no clubbing / cyanosis. No joint  deformity upper and lower extremities. Good ROM, no contractures. Normal muscle tone.  Skin: no rashes, lesions, ulcers. No induration Neurologic: CN 2-12 grossly intact. Sensation intact, Strength 5/5 in all 4.  Psychiatric: Normal judgment and insight. Alert and oriented x 3. Normal mood.    Labs on Admission: I have personally reviewed following labs and imaging studies  CBC: Recent Labs  Lab 07/04/24 1552  WBC 4.6  NEUTROABS 2.3  HGB 12.6  HCT 38.7  MCV 93.3  PLT 298    Basic Metabolic Panel: Recent Labs  Lab 07/04/24 1552 07/04/24 1939  NA 142  --   K 2.9*  --   CL 104  --   CO2 29  --   GLUCOSE 131*  --   BUN 17  --   CREATININE 1.35*  --   CALCIUM 9.2  --   MG  --  2.3    GFR: CrCl cannot be calculated (Unknown ideal weight.).  Liver Function Tests: Recent Labs  Lab 07/04/24 1552  AST 22  ALT 18  ALKPHOS 61  BILITOT 0.8  PROT 7.8  ALBUMIN 3.6      Radiological Exams on Admission: CT ABDOMEN PELVIS W CONTRAST Result Date: 07/04/2024 CLINICAL DATA:  Abdominal pain, acute, nonlocalized stated she can only hold down crackers and yogurt, but still vomits. EXAM: CT ABDOMEN AND PELVIS WITH CONTRAST TECHNIQUE: Multidetector CT imaging of the abdomen and pelvis was performed using the standard protocol following bolus administration of intravenous contrast. RADIATION DOSE REDUCTION: This exam was performed according to the departmental dose-optimization program which includes automated exposure control, adjustment of the mA and/or kV according to patient size and/or use of iterative reconstruction technique. CONTRAST:  80mL OMNIPAQUE  IOHEXOL  300 MG/ML  SOLN COMPARISON:  CT abdomen pelvis 06/04/2007 FINDINGS: Lower chest: At least small volume hiatal hernia. No acute abnormality. Hepatobiliary: Pericentimeter fluid dense lesion of the left hepatic lobe likely a simple hepatic cyst. Nonspecific calcification of the right hepatic lobe (2:20). No gallstones,  gallbladder wall thickening, or pericholecystic fluid. No biliary dilatation. Pancreas: No focal lesion. Normal pancreatic contour. No surrounding inflammatory changes. No main pancreatic ductal dilatation. Spleen: Normal in size without focal abnormality. Adrenals/Urinary Tract: No adrenal nodule bilaterally. Bilateral kidneys enhance symmetrically. No hydronephrosis. No hydroureter. The urinary bladder is unremarkable. Stomach/Bowel: Stomach is within normal limits. No evidence of large bowel wall thickening or dilatation. Fluid dilatation of the first and second portion of the duodenum measuring up to 3.3 cm. Under distended third portion of the duodenum and small bowel distally. Question transition point of the third portion of the duodenum with limited evaluation for bowel wall thickening (7:71). Colonic diverticulosis. Appendix appears normal. Vascular/Lymphatic: No abdominal aorta or iliac aneurysm. Moderate atherosclerotic plaque of the aorta and its branches. No abdominal, pelvic, or inguinal lymphadenopathy. Reproductive: Status post hysterectomy. No adnexal masses. Other: No  intraperitoneal free fluid. No intraperitoneal free gas. No organized fluid collection. Musculoskeletal: No abdominal wall hernia or abnormality. No suspicious lytic or blastic osseous lesions. No acute displaced fracture. L3-L4 intervertebral disc space vacuum phenomenon, endplate sclerosis, subchondral degenerative cysts. IMPRESSION: 1. At least small volume hiatal hernia. 2. Fluid dilatation of the first and second portion of the duodenum measuring up to 3.3 cm with under distended third portion of the duodenum and collapsed small bowel distally. Question transition point of the third portion of the duodenum with limited evaluation. Question small bowel obstruction with underlying mass not fully excluded. No bowel perforation. No inflammatory changes of the bowel noted. 3. Colonic diverticulosis with no acute diverticulitis. 4.   Aortic Atherosclerosis (ICD10-I70.0). Electronically Signed   By: Morgane  Naveau M.D.   On: 07/04/2024 20:35    Assessment/Plan Principal Problem:   SBO (small bowel obstruction) (HCC) Active Problems:   AKI (acute kidney injury) (HCC)   CKD (chronic kidney disease), stage II   Hypokalemia   Constipation   Elevated lipase   N/v, no pain SBO, mass? Weight loss Npo, ivf, prn antiemetics Gen surg consulted   Constipation, tap water enema x1  Mild elevated lipase Pancrease unremarkable on ct, repeat lipase in am   Hypokalemia Replace k  AKI on CKDII vs CKD IIIa Ua ordered, pending collection, Ivf, renal dosing meds, repeat bmp in am   DVT prophylaxis: scd's for now, resume pharmaco prophylaxis if no surgery planned    Code Status:   full Family Communication:  Sister and daughter at bedside   Patient is from: home   Anticipated DC to: home   Anticipated DC date:  TBD   Consults called:  EDP called gen surg Admission status:  Inpatient med surg  Severity of Illness:   The appropriate patient status for this patient is INPATIENT due to history and comorbidities, severity of illness, required intensity of service to ensure the patient's safety and to avoid risk of adverse events/further clinical deterioration.  Severity of illness/comorbidities: n/v, not able to tolerate oral intake, sbo Intensity of service: tests, high frequency of surveillance, interventions It is not anticipated that the patient will be medically stable for discharge from the hospital within 2 midnights of admission.    Voice Recognition Betti dictation system was used to create this note, attempts have been made to correct errors. Please contact the author with questions and/or clarifications.  Ileana Cummins MD PhD FACP [facility]  How to contact the [facility] Attending or Consulting provider 7A - 7P or covering provider during after hours 7P -7A, for this patient?   Check the care team in [facility]  and look for a) attending/consulting [facility] provider listed and b) the TR[facility] team listed Log into [email or web address] and use [place]'s universal password to access. If you do not have the password, please contact the hospital operator. Locate the TR[facility] provider you are looking for under [facility] and page to a number that you can be directly reached. If you still have difficulty reaching the provider, please page the [facility] (Director on Call) for the Hospitalists listed on amion for assistance.  07/04/2024, 10:28 PM

## 2024-07-04 NOTE — ED Notes (Signed)
 Patient transported to CT

## 2024-07-04 NOTE — ED Provider Triage Note (Signed)
 Emergency Medicine Provider Triage Evaluation Note  ZAIRE VANBUSKIRK , a 73 y.o. female  was evaluated in triage.  Pt complains of abdominal pain and emesis.  Pain occurs surrounding the emesis.  Also endorses constipation.  Recently seen by her gastroenterologist.  States she has not been able to tolerate anything significant p.o over the past 3 weeks. No significant medical history.  Review of Systems  Positive: As above Negative: As above  Physical Exam  BP (!) 183/88   Pulse 78   Temp 97.7 F (36.5 C) (Oral)   Resp 18   SpO2 98%  Gen:   Awake, no distress   Resp:  Normal effort  MSK:   Moves extremities without difficulty Other:    Medical Decision Making  Medically screening exam initiated at 3:22 PM.  Appropriate orders placed.  ACELYNN DEJONGE was informed that the remainder of the evaluation will be completed by another provider, this initial triage assessment does not replace that evaluation, and the importance of remaining in the ED until their evaluation is complete.     Hildegard Loge, PA-C 07/04/24 1523

## 2024-07-05 ENCOUNTER — Encounter (HOSPITAL_COMMUNITY): Payer: Self-pay | Admitting: Internal Medicine

## 2024-07-05 DIAGNOSIS — N182 Chronic kidney disease, stage 2 (mild): Secondary | ICD-10-CM

## 2024-07-05 DIAGNOSIS — R748 Abnormal levels of other serum enzymes: Secondary | ICD-10-CM

## 2024-07-05 DIAGNOSIS — K56609 Unspecified intestinal obstruction, unspecified as to partial versus complete obstruction: Secondary | ICD-10-CM | POA: Diagnosis not present

## 2024-07-05 DIAGNOSIS — R112 Nausea with vomiting, unspecified: Secondary | ICD-10-CM | POA: Diagnosis not present

## 2024-07-05 DIAGNOSIS — N179 Acute kidney failure, unspecified: Secondary | ICD-10-CM | POA: Diagnosis not present

## 2024-07-05 LAB — COMPREHENSIVE METABOLIC PANEL WITH GFR
ALT: 15 U/L (ref 0–44)
AST: 20 U/L (ref 15–41)
Albumin: 3 g/dL — ABNORMAL LOW (ref 3.5–5.0)
Alkaline Phosphatase: 56 U/L (ref 38–126)
Anion gap: 8 (ref 5–15)
BUN: 12 mg/dL (ref 8–23)
CO2: 26 mmol/L (ref 22–32)
Calcium: 8.5 mg/dL — ABNORMAL LOW (ref 8.9–10.3)
Chloride: 106 mmol/L (ref 98–111)
Creatinine, Ser: 1.1 mg/dL — ABNORMAL HIGH (ref 0.44–1.00)
GFR, Estimated: 53 mL/min — ABNORMAL LOW (ref >60)
Glucose, Bld: 103 mg/dL — ABNORMAL HIGH (ref 70–99)
Potassium: 3 mmol/L — ABNORMAL LOW (ref 3.5–5.1)
Sodium: 140 mmol/L (ref 135–145)
Total Bilirubin: 0.9 mg/dL (ref 0.0–1.2)
Total Protein: 6.2 g/dL — ABNORMAL LOW (ref 6.5–8.1)

## 2024-07-05 LAB — CBC
HCT: 35.3 % — ABNORMAL LOW (ref 36.0–46.0)
Hemoglobin: 11.5 g/dL — ABNORMAL LOW (ref 12.0–15.0)
MCH: 30.3 pg (ref 26.0–34.0)
MCHC: 32.6 g/dL (ref 30.0–36.0)
MCV: 93.1 fL (ref 80.0–100.0)
Platelets: 252 K/uL (ref 150–400)
RBC: 3.79 MIL/uL — ABNORMAL LOW (ref 3.87–5.11)
RDW: 12.4 % (ref 11.5–15.5)
WBC: 4.9 K/uL (ref 4.0–10.5)
nRBC: 0 % (ref 0.0–0.2)

## 2024-07-05 MED ORDER — HYDRALAZINE HCL 20 MG/ML IJ SOLN
10.0000 mg | Freq: Once | INTRAMUSCULAR | Status: DC
  Filled 2024-07-05: qty 1

## 2024-07-05 MED ORDER — HYDRALAZINE HCL 20 MG/ML IJ SOLN
10.0000 mg | INTRAMUSCULAR | Status: DC | PRN
Start: 2024-07-05 — End: 2024-07-05

## 2024-07-05 MED ORDER — HYDRALAZINE HCL 20 MG/ML IJ SOLN
10.0000 mg | Freq: Four times a day (QID) | INTRAMUSCULAR | Status: DC | PRN
  Administered 2024-07-06 – 2024-07-23 (×7): 10 mg via INTRAVENOUS
  Filled 2024-07-05 (×9): qty 1

## 2024-07-05 MED ORDER — MELATONIN 5 MG PO TABS: 5.0000 mg | ORAL_TABLET | Freq: Every evening | ORAL | Status: DC | PRN

## 2024-07-05 MED ORDER — ACETAMINOPHEN 325 MG PO TABS
650.0000 mg | ORAL_TABLET | Freq: Four times a day (QID) | ORAL | Status: DC | PRN
  Administered 2024-07-05: 650 mg via ORAL
  Filled 2024-07-05 (×2): qty 2

## 2024-07-05 MED ORDER — POTASSIUM CHLORIDE 10 MEQ/100ML IV SOLN
10.0000 meq | INTRAVENOUS | Status: AC
  Administered 2024-07-05 (×3): 10 meq via INTRAVENOUS
  Filled 2024-07-05 (×3): qty 100

## 2024-07-05 NOTE — Consult Note (Signed)
 Referring Provider:  Primary Care Physician:  Paseda, Folashade R, FNP Primary Gastroenterologist:  Dr. Rosalie  Reason for Consultation: Nausea, vomiting,  and abnormal CT  HPI: Lauren Gray is a 73 y.o. female with past medical history mentioned below presented to the hospital with nausea and vomiting of 1 month duration.  Was seen by our PA few days ago for similar symptoms.  Abdominal x-ray was ordered which showed no evidence of bowel obstruction and she was scheduled for upper endoscopy for further evaluation but she presented to the hospital with ongoing symptoms.  Blood work showed mildly elevated lipase at 58, mildly elevated creatinine otherwise normal CBC and CMP.  CT abdomen pelvis with IV contrast showed finding concerning for small bowel obstruction likely from underlying mass with dilation of the 1st and 2nd portion of the duodenum and transition likely in the third portion of the duodenum.  Patient seen and examined at bedside.  Complaining of intermittent nausea and vomiting for 1 month duration.  Complaining of constipation also for similar duration.  Denies seeing any blood in the stool or black stool.  10 pound weight loss in last 1 month.  Denies any significant NSAID use.   Past Medical History:  Diagnosis Date   Obesity    Scoliosis     Past Surgical History:  Procedure Laterality Date   ABDOMINAL HYSTERECTOMY     TVH   PELVIC LAPAROSCOPY  04/06/2006   BSO    Prior to Admission medications   Medication Sig Start Date End Date Taking? Authorizing Provider  omeprazole (PRILOSEC) 40 MG capsule Take 40 mg by mouth daily. Half hour before morning meal 07/01/24  Yes [provider]  Ascorbic Acid (VITAMIN C) 1000 MG tablet Take 1,000 mg by mouth daily. Patient not taking: Reported on 06/18/2024    [provider]  cholecalciferol (VITAMIN D ) 1000 UNITS tablet Take 1,000 Units by mouth daily. Patient not taking: Reported on 06/18/2024    [provider]  [facility] Liver Oil 5000-500 UNIT/5ML OIL Take by mouth. Patient not taking: Reported on 06/18/2024    [provider]  famotidine  (PEPCID ) 20 MG tablet Take 1 tablet (20 mg total) by mouth 2 (two) times daily as needed for heartburn or indigestion. 06/18/24   Paseda, Folashade R, FNP  nystatin -triamcinolone  ointment (MYCOLOG) Apply 1 application topically 2 (two) times daily. Thin application on affected skin as needed. Patient not taking: Reported on 06/18/2024 12/27/21   Lavoie, Marie-Lyne, MD  ondansetron  (ZOFRAN -ODT) 4 MG disintegrating tablet Take 1 tablet (4 mg total) by mouth every 8 (eight) hours as needed for nausea or vomiting. 06/23/24   Paseda, Folashade R, FNP    Scheduled Meds:  hydrALAZINE   10 mg Intravenous Once   Continuous Infusions:  lactated ringers  75 mL/hr at 07/04/24 2240   PRN Meds:.hydrALAZINE , ondansetron  (ZOFRAN ) IV  Allergies as of 07/04/2024 - Review Complete 07/04/2024  Allergen Reaction Noted   Latex  12/26/2011   Codeine Anxiety 09/28/2013    Family History  Problem Relation Age of Onset   Cancer Mother        ENDOMETRIOD OF OVARY   Diabetes Father    Heart disease Father    Breast cancer Sister 81   Aneurysm Sister    Diabetes Paternal Grandmother     Social History   Socioeconomic History   Marital status: Married    Spouse name: Not on file   Number of children: 1   Years of education: Not on  file   Highest education level: Not on file  Occupational History   Not on file  Tobacco Use   Smoking status: Never   Smokeless tobacco: Never  Vaping Use   Vaping status: Never Used  Substance and Sexual Activity   Alcohol use: Not Currently   Drug use: No   Sexual activity: Yes    Partners: Male    Birth control/protection: Surgical    Comment: HYSTERECTOMY  Other Topics Concern   Not on file  Social History Narrative   Lives with her husband    Social Drivers of Corporate investment banker Strain: Not on file  Food  Insecurity: Not on file  Transportation Needs: Not on file  Physical Activity: Not on file  Stress: Not on file  Social Connections: Not on file  Intimate Partner Violence: Not on file    Review of Systems: All negative except as stated above in HPI.  Physical Exam: Vital signs: Vitals:   07/05/24 0548 07/05/24 0904  BP: (!) 152/41 (!) 159/65  Pulse: 75 71  Resp: 18 16  Temp: 98.2 F (36.8 C) 98.2 F (36.8 C)  SpO2: 98% 98%   Last BM Date : 06/22/24 General:   Alert,  Well-developed, well-nourished, pleasant and cooperative in NAD Normocephalic, atraumatic Extraocular movement intact, no scleral icterus Lungs:  Clear throughout to auscultation.   No wheezes, crackles, or rhonchi. No acute distress. Heart:  Regular rate and rhythm; no murmurs, clicks, rubs,  or gallops. Abdomen: Soft, nontender, nondistended, bowel sound present, no peritoneal signs Mood and affect normal Alert and oriented x 3 Rectal:  Deferred  GI:  Lab Results: Recent Labs    07/04/24 1552 07/05/24 0536  WBC 4.6 4.9  HGB 12.6 11.5*  HCT 38.7 35.3*  PLT 298 252   BMET Recent Labs    07/04/24 1552 07/05/24 0536  NA 142 140  K 2.9* 3.0*  CL 104 106  CO2 29 26  GLUCOSE 131* 103*  BUN 17 12  CREATININE 1.35* 1.10*  CALCIUM 9.2 8.5*   LFT Recent Labs    07/05/24 0536  PROT 6.2*  ALBUMIN 3.0*  AST 20  ALT 15  ALKPHOS 56  BILITOT 0.9   PT/INR No results for input(s): LABPROT, INR in the last 72 hours.   Studies/Results: CT ABDOMEN PELVIS W CONTRAST Result Date: 07/04/2024 CLINICAL DATA:  Abdominal pain, acute, nonlocalized stated she can only hold down crackers and yogurt, but still vomits. EXAM: CT ABDOMEN AND PELVIS WITH CONTRAST TECHNIQUE: Multidetector CT imaging of the abdomen and pelvis was performed using the standard protocol following bolus administration of intravenous contrast. RADIATION DOSE REDUCTION: This exam was performed according to the departmental  dose-optimization program which includes automated exposure control, adjustment of the mA and/or kV according to patient size and/or use of iterative reconstruction technique. CONTRAST:  80mL OMNIPAQUE  IOHEXOL  300 MG/ML  SOLN COMPARISON:  CT abdomen pelvis 06/04/2007 FINDINGS: Lower chest: At least small volume hiatal hernia. No acute abnormality. Hepatobiliary: Pericentimeter fluid dense lesion of the left hepatic lobe likely a simple hepatic cyst. Nonspecific calcification of the right hepatic lobe (2:20). No gallstones, gallbladder wall thickening, or pericholecystic fluid. No biliary dilatation. Pancreas: No focal lesion. Normal pancreatic contour. No surrounding inflammatory changes. No main pancreatic ductal dilatation. Spleen: Normal in size without focal abnormality. Adrenals/Urinary Tract: No adrenal nodule bilaterally. Bilateral kidneys enhance symmetrically. No hydronephrosis. No hydroureter. The urinary bladder is unremarkable. Stomach/Bowel: Stomach is within normal limits. No evidence of  large bowel wall thickening or dilatation. Fluid dilatation of the first and second portion of the duodenum measuring up to 3.3 cm. Under distended third portion of the duodenum and small bowel distally. Question transition point of the third portion of the duodenum with limited evaluation for bowel wall thickening (7:71). Colonic diverticulosis. Appendix appears normal. Vascular/Lymphatic: No abdominal aorta or iliac aneurysm. Moderate atherosclerotic plaque of the aorta and its branches. No abdominal, pelvic, or inguinal lymphadenopathy. Reproductive: Status post hysterectomy. No adnexal masses. Other: No intraperitoneal free fluid. No intraperitoneal free gas. No organized fluid collection. Musculoskeletal: No abdominal wall hernia or abnormality. No suspicious lytic or blastic osseous lesions. No acute displaced fracture. L3-L4 intervertebral disc space vacuum phenomenon, endplate sclerosis, subchondral  degenerative cysts. IMPRESSION: 1. At least small volume hiatal hernia. 2. Fluid dilatation of the first and second portion of the duodenum measuring up to 3.3 cm with under distended third portion of the duodenum and collapsed small bowel distally. Question transition point of the third portion of the duodenum with limited evaluation. Question small bowel obstruction with underlying mass not fully excluded. No bowel perforation. No inflammatory changes of the bowel noted. 3. Colonic diverticulosis with no acute diverticulitis. 4.  Aortic Atherosclerosis (ICD10-I70.0). Electronically Signed   By: Morgane  Naveau M.D.   On: 07/04/2024 20:35    Impression/Plan: -Intermittent nausea and vomiting for 1 month duration with a CT scan showing possible small bowel obstruction in the third portion of the duodenum . underlying mass cannot be excluded. - Constipation.  Recommendations ---------------------------- - Plan for push enteroscopy tomorrow - Okay to have full liquid diet today.  Keep n.p.o. past midnight.  Risks (bleeding, infection, bowel perforation that could require surgery, sedation-related changes in cardiopulmonary systems), benefits (identification and possible treatment of source of symptoms, exclusion of certain causes of symptoms), and alternatives (watchful waiting, radiographic imaging studies, empiric medical treatment)  were explained to patient/family in detail and patient wishes to proceed.     LOS: 1 day   Layla Lah  MD, FACP 07/05/2024, 9:25 AM  Contact #  [phone]

## 2024-07-05 NOTE — Plan of Care (Signed)
   Problem: Coping: Goal: Level of anxiety will decrease Outcome: Progressing

## 2024-07-05 NOTE — Progress Notes (Signed)
 Enema given with very poor result. Small mucous stool formed.

## 2024-07-05 NOTE — Plan of Care (Signed)
   Problem: Education: Goal: Knowledge of General Education information will improve Description: Including pain rating scale, medication(s)/side effects and non-pharmacologic comfort measures Outcome: Progressing   Problem: Safety: Goal: Ability to remain free from injury will improve Outcome: Progressing

## 2024-07-05 NOTE — H&P (View-Only) (Signed)
 Referring Provider:  Primary Care Physician:  Paseda, Folashade R, FNP Primary Gastroenterologist:  Dr. Rosalie  Reason for Consultation: Nausea, vomiting,  and abnormal CT  HPI: Lauren Gray is a 73 y.o. female with past medical history mentioned below presented to the hospital with nausea and vomiting of 1 month duration.  Was seen by our PA few days ago for similar symptoms.  Abdominal x-ray was ordered which showed no evidence of bowel obstruction and she was scheduled for upper endoscopy for further evaluation but she presented to the hospital with ongoing symptoms.  Blood work showed mildly elevated lipase at 58, mildly elevated creatinine otherwise normal CBC and CMP.  CT abdomen pelvis with IV contrast showed finding concerning for small bowel obstruction likely from underlying mass with dilation of the 1st and 2nd portion of the duodenum and transition likely in the third portion of the duodenum.  Patient seen and examined at bedside.  Complaining of intermittent nausea and vomiting for 1 month duration.  Complaining of constipation also for similar duration.  Denies seeing any blood in the stool or black stool.  10 pound weight loss in last 1 month.  Denies any significant NSAID use.   Past Medical History:  Diagnosis Date   Obesity    Scoliosis     Past Surgical History:  Procedure Laterality Date   ABDOMINAL HYSTERECTOMY     TVH   PELVIC LAPAROSCOPY  04/06/2006   BSO    Prior to Admission medications   Medication Sig Start Date End Date Taking? Authorizing Provider  omeprazole (PRILOSEC) 40 MG capsule Take 40 mg by mouth daily. Half hour before morning meal 07/01/24  Yes [provider]  Ascorbic Acid (VITAMIN C) 1000 MG tablet Take 1,000 mg by mouth daily. Patient not taking: Reported on 06/18/2024    [provider]  cholecalciferol (VITAMIN D ) 1000 UNITS tablet Take 1,000 Units by mouth daily. Patient not taking: Reported on 06/18/2024    [provider]  [facility] Liver Oil 5000-500 UNIT/5ML OIL Take by mouth. Patient not taking: Reported on 06/18/2024    [provider]  famotidine  (PEPCID ) 20 MG tablet Take 1 tablet (20 mg total) by mouth 2 (two) times daily as needed for heartburn or indigestion. 06/18/24   Paseda, Folashade R, FNP  nystatin -triamcinolone  ointment (MYCOLOG) Apply 1 application topically 2 (two) times daily. Thin application on affected skin as needed. Patient not taking: Reported on 06/18/2024 12/27/21   Lavoie, Marie-Lyne, MD  ondansetron  (ZOFRAN -ODT) 4 MG disintegrating tablet Take 1 tablet (4 mg total) by mouth every 8 (eight) hours as needed for nausea or vomiting. 06/23/24   Paseda, Folashade R, FNP    Scheduled Meds:  hydrALAZINE   10 mg Intravenous Once   Continuous Infusions:  lactated ringers  75 mL/hr at 07/04/24 2240   PRN Meds:.hydrALAZINE , ondansetron  (ZOFRAN ) IV  Allergies as of 07/04/2024 - Review Complete 07/04/2024  Allergen Reaction Noted   Latex  12/26/2011   Codeine Anxiety 09/28/2013    Family History  Problem Relation Age of Onset   Cancer Mother        ENDOMETRIOD OF OVARY   Diabetes Father    Heart disease Father    Breast cancer Sister 81   Aneurysm Sister    Diabetes Paternal Grandmother     Social History   Socioeconomic History   Marital status: Married    Spouse name: Not on file   Number of children: 1   Years of education: Not on  file   Highest education level: Not on file  Occupational History   Not on file  Tobacco Use   Smoking status: Never   Smokeless tobacco: Never  Vaping Use   Vaping status: Never Used  Substance and Sexual Activity   Alcohol use: Not Currently   Drug use: No   Sexual activity: Yes    Partners: Male    Birth control/protection: Surgical    Comment: HYSTERECTOMY  Other Topics Concern   Not on file  Social History Narrative   Lives with her husband    Social Drivers of Corporate investment banker Strain: Not on file  Food  Insecurity: Not on file  Transportation Needs: Not on file  Physical Activity: Not on file  Stress: Not on file  Social Connections: Not on file  Intimate Partner Violence: Not on file    Review of Systems: All negative except as stated above in HPI.  Physical Exam: Vital signs: Vitals:   07/05/24 0548 07/05/24 0904  BP: (!) 152/41 (!) 159/65  Pulse: 75 71  Resp: 18 16  Temp: 98.2 F (36.8 C) 98.2 F (36.8 C)  SpO2: 98% 98%   Last BM Date : 06/22/24 General:   Alert,  Well-developed, well-nourished, pleasant and cooperative in NAD Normocephalic, atraumatic Extraocular movement intact, no scleral icterus Lungs:  Clear throughout to auscultation.   No wheezes, crackles, or rhonchi. No acute distress. Heart:  Regular rate and rhythm; no murmurs, clicks, rubs,  or gallops. Abdomen: Soft, nontender, nondistended, bowel sound present, no peritoneal signs Mood and affect normal Alert and oriented x 3 Rectal:  Deferred  GI:  Lab Results: Recent Labs    07/04/24 1552 07/05/24 0536  WBC 4.6 4.9  HGB 12.6 11.5*  HCT 38.7 35.3*  PLT 298 252   BMET Recent Labs    07/04/24 1552 07/05/24 0536  NA 142 140  K 2.9* 3.0*  CL 104 106  CO2 29 26  GLUCOSE 131* 103*  BUN 17 12  CREATININE 1.35* 1.10*  CALCIUM 9.2 8.5*   LFT Recent Labs    07/05/24 0536  PROT 6.2*  ALBUMIN 3.0*  AST 20  ALT 15  ALKPHOS 56  BILITOT 0.9   PT/INR No results for input(s): LABPROT, INR in the last 72 hours.   Studies/Results: CT ABDOMEN PELVIS W CONTRAST Result Date: 07/04/2024 CLINICAL DATA:  Abdominal pain, acute, nonlocalized stated she can only hold down crackers and yogurt, but still vomits. EXAM: CT ABDOMEN AND PELVIS WITH CONTRAST TECHNIQUE: Multidetector CT imaging of the abdomen and pelvis was performed using the standard protocol following bolus administration of intravenous contrast. RADIATION DOSE REDUCTION: This exam was performed according to the departmental  dose-optimization program which includes automated exposure control, adjustment of the mA and/or kV according to patient size and/or use of iterative reconstruction technique. CONTRAST:  80mL OMNIPAQUE  IOHEXOL  300 MG/ML  SOLN COMPARISON:  CT abdomen pelvis 06/04/2007 FINDINGS: Lower chest: At least small volume hiatal hernia. No acute abnormality. Hepatobiliary: Pericentimeter fluid dense lesion of the left hepatic lobe likely a simple hepatic cyst. Nonspecific calcification of the right hepatic lobe (2:20). No gallstones, gallbladder wall thickening, or pericholecystic fluid. No biliary dilatation. Pancreas: No focal lesion. Normal pancreatic contour. No surrounding inflammatory changes. No main pancreatic ductal dilatation. Spleen: Normal in size without focal abnormality. Adrenals/Urinary Tract: No adrenal nodule bilaterally. Bilateral kidneys enhance symmetrically. No hydronephrosis. No hydroureter. The urinary bladder is unremarkable. Stomach/Bowel: Stomach is within normal limits. No evidence of  large bowel wall thickening or dilatation. Fluid dilatation of the first and second portion of the duodenum measuring up to 3.3 cm. Under distended third portion of the duodenum and small bowel distally. Question transition point of the third portion of the duodenum with limited evaluation for bowel wall thickening (7:71). Colonic diverticulosis. Appendix appears normal. Vascular/Lymphatic: No abdominal aorta or iliac aneurysm. Moderate atherosclerotic plaque of the aorta and its branches. No abdominal, pelvic, or inguinal lymphadenopathy. Reproductive: Status post hysterectomy. No adnexal masses. Other: No intraperitoneal free fluid. No intraperitoneal free gas. No organized fluid collection. Musculoskeletal: No abdominal wall hernia or abnormality. No suspicious lytic or blastic osseous lesions. No acute displaced fracture. L3-L4 intervertebral disc space vacuum phenomenon, endplate sclerosis, subchondral  degenerative cysts. IMPRESSION: 1. At least small volume hiatal hernia. 2. Fluid dilatation of the first and second portion of the duodenum measuring up to 3.3 cm with under distended third portion of the duodenum and collapsed small bowel distally. Question transition point of the third portion of the duodenum with limited evaluation. Question small bowel obstruction with underlying mass not fully excluded. No bowel perforation. No inflammatory changes of the bowel noted. 3. Colonic diverticulosis with no acute diverticulitis. 4.  Aortic Atherosclerosis (ICD10-I70.0). Electronically Signed   By: Morgane  Naveau M.D.   On: 07/04/2024 20:35    Impression/Plan: -Intermittent nausea and vomiting for 1 month duration with a CT scan showing possible small bowel obstruction in the third portion of the duodenum . underlying mass cannot be excluded. - Constipation.  Recommendations ---------------------------- - Plan for push enteroscopy tomorrow - Okay to have full liquid diet today.  Keep n.p.o. past midnight.  Risks (bleeding, infection, bowel perforation that could require surgery, sedation-related changes in cardiopulmonary systems), benefits (identification and possible treatment of source of symptoms, exclusion of certain causes of symptoms), and alternatives (watchful waiting, radiographic imaging studies, empiric medical treatment)  were explained to patient/family in detail and patient wishes to proceed.     LOS: 1 day   Layla Lah  MD, FACP 07/05/2024, 9:25 AM  Contact #  [phone]

## 2024-07-05 NOTE — Progress Notes (Signed)
 Nurse did not give hydralazine  at 0144 because BP decreased to 155/71 and HR decreased to 77. On call NP  was notified, No further orders was given.

## 2024-07-05 NOTE — Progress Notes (Signed)
 [facility]  PROGRESS NOTE  Lauren HATAWAY FMW:[id number] DOB: 1951/06/28 DOA: 07/04/2024 PCP: Paseda, Folashade R, FNP   Brief HPI:     73 y.o. female with medical history significant of HLD, prediabetes, CKDII does not take any medication on regular basis,  past surgical h/o S/P total hysterectomy and BSO with  presents with vomiting x 1 month, can only hold down crackers and yogurt, has not had a full bowel movement in a few weeks, last vomited this morning, last bm with small pebbles this morning, +flatus, no ab pain, + weight loss of 10lbs in the last months.    Assessment/Plan:   Intermittent nausea/vomiting - CT scan shows dilated 1st and 2nd portion of duodenum with possible underlying mass [facility] gastroenterology consulted - Plan for push enteroscopy in a.m. - Keep n.p.o. after midnight - General Surgery consulted, will see after enteroscopy in a.m.   Constipation  -tap water enema x1   Mild elevated lipase Pancrease unremarkable on ct, repeat lipase in am     Hypokalemia -Potassium is 3.0 this morning Will replace potassium and follow BMP in am   AKI on CKDII vs CKD IIIa -Creatinine improved to 1.10  Hypertension - Start IV hydralazine  as needed    Medications     hydrALAZINE   10 mg Intravenous Once     Data Reviewed:   CBG:  No results for input(s): GLUCAP in the last 168 hours.  SpO2: 98 %    Vitals:   07/04/24 2325 07/05/24 0102 07/05/24 0211 07/05/24 0548  BP: (!) 190/77 (!) 177/71 (!) 151/57 (!) 152/41  Pulse: 86 77 75 75  Resp: 18 18 17 18   Temp: (!) 97.5 F (36.4 C) 98.2 F (36.8 C) 98.5 F (36.9 C) 98.2 F (36.8 C)  TempSrc: Oral Oral Oral Oral  SpO2: 100% 100% 100% 98%      Data Reviewed:  Basic Metabolic Panel: Recent Labs  Lab 07/04/24 1552 07/04/24 1939 07/05/24 0536  NA 142  --  140  K 2.9*  --  3.0*  CL 104  --  106  CO2 29  --  26  GLUCOSE 131*  --  103*  BUN 17  --  12  CREATININE 1.35*  --  1.10*   CALCIUM 9.2  --  8.5*  MG  --  2.3  --     CBC: Recent Labs  Lab 07/04/24 1552 07/05/24 0536  WBC 4.6 4.9  NEUTROABS 2.3  --   HGB 12.6 11.5*  HCT 38.7 35.3*  MCV 93.3 93.1  PLT 298 252    LFT Recent Labs  Lab 07/04/24 1552 07/05/24 0536  AST 22 20  ALT 18 15  ALKPHOS 61 56  BILITOT 0.8 0.9  PROT 7.8 6.2*  ALBUMIN 3.6 3.0*     Antibiotics: Anti-infectives (From admission, onward)    None        DVT prophylaxis: SCDs  Code Status: Full code  Family Communication: Discussed with patient's daughter and husband at bedside   CONSULTS    Subjective   Denies pain.  No nausea or vomiting   Objective    Physical Examination:  General-appears in no acute distress Heart-S1-S2, regular, no murmur auscultated Lungs-clear to auscultation bilaterally, no wheezing or crackles auscultated Abdomen-soft, nontender, no organomegaly Extremities-no edema in the lower extremities Neuro-alert, oriented x3, no focal deficit noted  Status is: Inpatient:             [place]  [facility] If 7PM-7AM, please contact night-coverage at [email or web address], Office  [phone]   07/05/2024, 8:10 AM  LOS: 1 day

## 2024-07-06 ENCOUNTER — Inpatient Hospital Stay (HOSPITAL_COMMUNITY): Admitting: Certified Registered Nurse Anesthetist

## 2024-07-06 ENCOUNTER — Encounter (HOSPITAL_COMMUNITY): Payer: Self-pay | Admitting: Internal Medicine

## 2024-07-06 ENCOUNTER — Encounter (HOSPITAL_COMMUNITY)
Admission: EM | Disposition: A | Discharge status: Rehabilitation Facility | Payer: Self-pay | Source: Home / Self Care | Attending: Family Medicine

## 2024-07-06 DIAGNOSIS — E785 Hyperlipidemia, unspecified: Secondary | ICD-10-CM

## 2024-07-06 DIAGNOSIS — K2100 Gastro-esophageal reflux disease with esophagitis, without bleeding: Secondary | ICD-10-CM | POA: Diagnosis not present

## 2024-07-06 DIAGNOSIS — K449 Diaphragmatic hernia without obstruction or gangrene: Secondary | ICD-10-CM

## 2024-07-06 DIAGNOSIS — N182 Chronic kidney disease, stage 2 (mild): Secondary | ICD-10-CM | POA: Diagnosis not present

## 2024-07-06 DIAGNOSIS — K56609 Unspecified intestinal obstruction, unspecified as to partial versus complete obstruction: Secondary | ICD-10-CM | POA: Diagnosis not present

## 2024-07-06 HISTORY — PX: ENTEROSCOPY: SHX5533

## 2024-07-06 HISTORY — PX: BONE BIOPSY: SHX375

## 2024-07-06 LAB — BASIC METABOLIC PANEL WITH GFR
Anion gap: 8 (ref 5–15)
BUN: 8 mg/dL (ref 8–23)
CO2: 24 mmol/L (ref 22–32)
Calcium: 8.3 mg/dL — ABNORMAL LOW (ref 8.9–10.3)
Chloride: 108 mmol/L (ref 98–111)
Creatinine, Ser: 1.08 mg/dL — ABNORMAL HIGH (ref 0.44–1.00)
GFR, Estimated: 55 mL/min — ABNORMAL LOW (ref >60)
Glucose, Bld: 100 mg/dL — ABNORMAL HIGH (ref 70–99)
Potassium: 3.1 mmol/L — ABNORMAL LOW (ref 3.5–5.1)
Sodium: 140 mmol/L (ref 135–145)

## 2024-07-06 LAB — LIPASE, BLOOD: Lipase: 34 U/L (ref 11–51)

## 2024-07-06 SURGERY — ENTEROSCOPY
Anesthesia: Monitor Anesthesia Care

## 2024-07-06 MED ORDER — POTASSIUM CHLORIDE CRYS ER 20 MEQ PO TBCR
40.0000 meq | EXTENDED_RELEASE_TABLET | Freq: Once | ORAL | Status: AC
  Administered 2024-07-06: 40 meq via ORAL
  Filled 2024-07-06: qty 2

## 2024-07-06 MED ORDER — SODIUM CHLORIDE 0.9 % IV SOLN: INTRAVENOUS | Status: DC

## 2024-07-06 MED ORDER — PANTOPRAZOLE SODIUM 40 MG IV SOLR
40.0000 mg | Freq: Two times a day (BID) | INTRAVENOUS | Status: DC
  Administered 2024-07-06 – 2024-08-10 (×68): 40 mg via INTRAVENOUS
  Filled 2024-07-06 (×70): qty 10

## 2024-07-06 MED ORDER — POTASSIUM CHLORIDE 10 MEQ/100ML IV SOLN
10.0000 meq | INTRAVENOUS | Status: AC
  Administered 2024-07-06: 10 meq via INTRAVENOUS
  Filled 2024-07-06 (×3): qty 100

## 2024-07-06 MED ORDER — PROPOFOL 500 MG/50ML IV EMUL
INTRAVENOUS | Status: DC | PRN
Start: 2024-07-06 — End: 2024-07-06
  Administered 2024-07-06: 125 ug/kg/min via INTRAVENOUS

## 2024-07-06 MED ORDER — LIDOCAINE HCL (CARDIAC) PF 100 MG/5ML IV SOSY
PREFILLED_SYRINGE | INTRAVENOUS | Status: DC | PRN
  Administered 2024-07-06: 100 mg via INTRAVENOUS

## 2024-07-06 NOTE — Plan of Care (Signed)

## 2024-07-06 NOTE — Transfer of Care (Signed)
 Immediate Anesthesia Transfer of Care Note  Patient: Lauren Gray Counter  Procedure(s) Performed: ENTEROSCOPY BIOPSY, GI  Patient Location: Endoscopy Unit  Anesthesia Type:MAC  Level of Consciousness: drowsy  Airway & Oxygen Therapy: Patient Spontanous Breathing and Patient connected to face mask oxygen  Post-op Assessment: Report given to RN and Post -op Vital signs reviewed and stable  Post vital signs: Reviewed and stable  Last Vitals:  Vitals Value Taken Time  BP    Temp    Pulse    Resp    SpO2      Last Pain:  Vitals:   07/06/24 0935  TempSrc:   PainSc: 1          Complications: No notable events documented.

## 2024-07-06 NOTE — Op Note (Signed)
 [facility] Patient Name: Lauren Gray Procedure Date: 07/06/2024 MRN: [id number] Attending MD: Layla Lah , MD, [id number] Date of Birth: August 02, 1951 CSN: [id number] Age: 73 Admit Type: Inpatient Procedure:                Small bowel enteroscopy Indications:              Abnormal abdominal CT Providers:                Layla Lah, MD, Collene Edu, RN, Farris Southgate,                            Technician Referring MD:              Medicines:                 Complications:            No immediate complications. Estimated Blood Loss:     Estimated blood loss was minimal. Procedure:                Pre-Anesthesia Assessment:                           - Prior to the procedure, a History and Physical                            was performed, and patient medications and                            allergies were reviewed. The patient's tolerance of                            previous anesthesia was also reviewed. The risks                            and benefits of the procedure and the sedation                            options and risks were discussed with the patient.                            All questions were answered, and informed consent                            was obtained. Prior Anticoagulants: The patient has                            taken no anticoagulant or antiplatelet agents. ASA                            Grade Assessment: IV - A patient with severe                            systemic disease that is a constant threat to life.  After reviewing the risks and benefits, the patient                            was deemed in satisfactory condition to undergo the                            procedure.                           After obtaining informed consent, the endoscope was                            passed under direct vision. Throughout the                            procedure, the patient's blood pressure, pulse, and                             oxygen saturations were monitored continuously. The                            PCF-H190TL ([id number]) Olympus Colonoscope was                            introduced through the mouth and advanced to the                            fourth part of duodenum. The small bowel                            enteroscopy was performed with moderate difficulty                            due to a partially obstructing mass. The patient                            tolerated the procedure well. Scope In: Scope Out: Findings:      LA Grade B (one or more mucosal breaks greater than 5 mm, not extending       between the tops of two mucosal folds) esophagitis was found in the       distal esophagus.      A small hiatal hernia was present.      Bilious fluid was found in the gastric body.      The cardia and gastric fundus were normal on retroflexion.      A medium-sized frond-like/villous and ulcerated mass with no bleeding       was found in the fourth portion of the duodenum. Biopsies were taken       with a cold forceps for histology. Advancing scope beyond 3rd or 4th       portion of the duodenum was difficult because of distorted anatomy and       likely an obstructive lesion. Despite of multiple attempts, I was not       able to advance scope beyond this area. Biopsies were taken to rule out  malignancy. Impression:               - LA Grade B reflux esophagitis.                           - Small hiatal hernia.                           - Bilious gastric fluid.                           - Duodenal mass. Biopsied. Recommendation:           - Return patient to hospital ward for ongoing care.                           - Clear liquid diet.                           - Continue present medications. Procedure Code(s):        --- Professional ---                           [phone], Small intestinal endoscopy, enteroscopy                            beyond second portion of duodenum, not  including                            ileum; with biopsy, single or multiple Diagnosis Code(s):        --- Professional ---                           K21.00, Gastro-esophageal reflux disease with                            esophagitis, without bleeding                           K44.9, Diaphragmatic hernia without obstruction or                            gangrene                           K31.89, Other diseases of stomach and duodenum                           R93.3, Abnormal findings on diagnostic imaging of                            other parts of digestive tract CPT copyright 2022 [employer]. All rights reserved. The codes documented in this report are preliminary and upon coder review may  be revised to meet current compliance requirements. Layla Lah, MD Layla Lah, MD 07/06/2024 10:29:43 AM Number of Addenda: 0

## 2024-07-06 NOTE — Anesthesia Preprocedure Evaluation (Addendum)
 Anesthesia Evaluation  Patient identified by MRN, date of birth, ID band Patient awake    Reviewed: Allergy & Precautions, NPO status , Patient's Chart, lab work & pertinent test results  History of Anesthesia Complications Negative for: history of anesthetic complications  Airway Mallampati: III  TM Distance: >3 FB Neck ROM: Full    Dental  (+) Dental Advisory Given, Teeth Intact   Pulmonary neg shortness of breath, neg sleep apnea, neg COPD, neg recent URI   breath sounds clear to auscultation       Cardiovascular  Rhythm:Regular     Neuro/Psych negative neurological ROS  negative psych ROS   GI/Hepatic Neg liver ROS,GERD  Medicated,,Nausea, vomiting, abnormal CT scan   Endo/Other    Renal/GU CRFRenal diseaseLab Results      Component                Value               Date                      NA                       140                 07/06/2024                K                        3.1 (L)             07/06/2024                CO2                      24                  07/06/2024                GLUCOSE                  100 (H)             07/06/2024                BUN                      8                   07/06/2024                CREATININE               1.08 (H)            07/06/2024                CALCIUM                  8.3 (L)             07/06/2024                EGFR                     45 (L)              06/18/2024  GFRNONAA                 55 (L)              07/06/2024                Musculoskeletal negative musculoskeletal ROS (+)    Abdominal   Peds  Hematology  (+) Blood dyscrasia, anemia Lab Results      Component                Value               Date                      WBC                      4.9                 07/05/2024                HGB                      11.5 (L)            07/05/2024                HCT                      35.3 (L)            07/05/2024                 MCV                      93.1                07/05/2024                PLT                      252                 07/05/2024              Anesthesia Other Findings   Reproductive/Obstetrics                              Anesthesia Physical Anesthesia Plan  ASA: 3  Anesthesia Plan: MAC   Post-op Pain Management: Minimal or no pain anticipated   Induction: Intravenous  PONV Risk Score and Plan: 2 and Propofol  infusion and Treatment may vary due to age or medical condition  Airway Management Planned: Nasal Cannula, Natural Airway and Simple Face Mask  Additional Equipment: None  Intra-op Plan:   Post-operative Plan:   Informed Consent: I have reviewed the patients History and Physical, chart, labs and discussed the procedure including the risks, benefits and alternatives for the proposed anesthesia with the patient or authorized representative who has indicated his/her understanding and acceptance.     Dental advisory given  Plan Discussed with: CRNA  Anesthesia Plan Comments:          Anesthesia Quick Evaluation

## 2024-07-06 NOTE — Progress Notes (Signed)
 [facility]  PROGRESS NOTE  Lauren Gray FMW:[id number] DOB: 1951-04-04 DOA: 07/04/2024 PCP: Paseda, Folashade R, FNP   Brief HPI:     73 y.o. female with medical history significant of HLD, prediabetes, CKDII does not take any medication on regular basis,  past surgical h/o S/P total hysterectomy and BSO with  presents with vomiting x 1 month, can only hold down crackers and yogurt, has not had a full bowel movement in a few weeks, last vomited this morning, last bm with small pebbles this morning, +flatus, no ab pain, + weight loss of 10lbs in the last months.    Assessment/Plan:   Intermittent nausea/vomiting - CT scan shows dilated 1st and 2nd portion of duodenum with possible underlying mass [facility] gastroenterology consulted:   -Clear liquid diet for now. -Keep n.p.o. past midnight for small bowel follow-through tomorrow. -Follow biopsy report -GI will follow. - General Surgery consulted and following   Constipation  - Regimen as able   Mild elevated lipase -Resolved     Hypokalemia -Replete   AKI on CKDII vs CKD IIIa -Appears resolved  Hypertension - Start IV hydralazine  as needed    Medications     hydrALAZINE   10 mg Intravenous Once   potassium chloride   40 mEq Oral Once     Data Reviewed:   CBG:  No results for input(s): GLUCAP in the last 168 hours.  SpO2: 98 %    Vitals:   07/06/24 1030 07/06/24 1035 07/06/24 1040 07/06/24 1101  BP: (!) 194/80  (!) 175/65 (!) 167/81  Pulse: 79 78 72 70  Resp: 16 19 15 16   Temp:    97.8 F (36.6 C)  TempSrc:    Oral  SpO2: 95% 97% 98% 98%  Weight:      Height:          Data Reviewed:  Basic Metabolic Panel: Recent Labs  Lab 07/04/24 1552 07/04/24 1939 07/05/24 0536 07/06/24 0441  NA 142  --  140 140  K 2.9*  --  3.0* 3.1*  CL 104  --  106 108  CO2 29  --  26 24  GLUCOSE 131*  --  103* 100*  BUN 17  --  12 8  CREATININE 1.35*  --  1.10* 1.08*  CALCIUM 9.2  --  8.5* 8.3*  MG  --   2.3  --   --     CBC: Recent Labs  Lab 07/04/24 1552 07/05/24 0536  WBC 4.6 4.9  NEUTROABS 2.3  --   HGB 12.6 11.5*  HCT 38.7 35.3*  MCV 93.3 93.1  PLT 298 252    LFT Recent Labs  Lab 07/04/24 1552 07/05/24 0536  AST 22 20  ALT 18 15  ALKPHOS 61 56  BILITOT 0.8 0.9  PROT 7.8 6.2*  ALBUMIN 3.6 3.0*     Antibiotics: Anti-infectives (From admission, onward)    None        DVT prophylaxis: SCDs  Code Status: Full code  Family Communication: Discussed with patient's daughter and husband at bedside   CONSULTS    Subjective   Denies pain.  No nausea or vomiting   Objective    Physical Examination:   General: Appearance:    Obese female in no acute distress     Lungs:      respirations unlabored  Heart:    Normal heart rate.   MS:   All extremities are intact.   Neurologic:   Awake, alert  Status is: Inpatient:             Lauren Gray   [facility] If 7PM-7AM, please contact night-coverage at [email or web address], Office  [phone]   07/06/2024, 11:25 AM  LOS: 2 days

## 2024-07-06 NOTE — Interval H&P Note (Signed)
 History and Physical Interval Note:  07/06/2024 9:39 AM  Lauren Gray  has presented today for surgery, with the diagnosis of Nausea, vomiting, abnormal CT scan.  The various methods of treatment have been discussed with the patient and family. After consideration of risks, benefits and other options for treatment, the patient has consented to  Procedure(s): ENTEROSCOPY (N/A) as a surgical intervention.  The patient's history has been reviewed, patient examined, no change in status, stable for surgery.  I have reviewed the patient's chart and labs.  Questions were answered to the patient's satisfaction.     Kymberlee Viger

## 2024-07-06 NOTE — Plan of Care (Signed)

## 2024-07-06 NOTE — Consult Note (Addendum)
 Reason for Consult: Gastric outlet obstruction Referring Physician: Gailya Tauer is an 73 y.o. female.  HPI:  Patient presents with approximately 2 weeks of worsening issues with tolerating oral intake.  She denies any issues with any longer prodrome.  She denies abdominal pain.  She has had reflux in association with this 2 weeks of nausea and vomiting.  She has tried liquids and over the past few days has not even been able to tolerate liquids.  She is up-to-date with a colonoscopy and had one around 2 years ago.  She is not sure that she has ever had an upper endoscopy.  Patient denies bloody stools.  She denies abnormal color of stools.  She denies weakness.  She has had a 10 pound weight loss.  She does not have a personal history of cancer, but her mother had endometrial cancer and sister had breast cancer.  The patient has had a hysterectomy.  Past Medical History:  Diagnosis Date   Obesity    Scoliosis     Past Surgical History:  Procedure Laterality Date   ABDOMINAL HYSTERECTOMY     TVH   PELVIC LAPAROSCOPY  04/06/2006   BSO    Family History  Problem Relation Age of Onset   Cancer Mother        ENDOMETRIOD OF OVARY   Diabetes Father    Heart disease Father    Breast cancer Sister 52   Aneurysm Sister    Diabetes Paternal Grandmother     Social History:  reports that she has never smoked. She has never used smokeless tobacco. She reports that she does not currently use alcohol. She reports that she does not use drugs.  Allergies:  Allergies  Allergen Reactions   Codeine Anxiety    i don't feel right at all   Latex Rash    Medications:  Current Meds  Medication Sig   famotidine  (PEPCID ) 20 MG tablet Take 1 tablet (20 mg total) by mouth 2 (two) times daily as needed for heartburn or indigestion.   omeprazole (PRILOSEC) 40 MG capsule Take 40 mg by mouth daily. Half hour before morning meal   ondansetron  (ZOFRAN -ODT) 4 MG disintegrating tablet Take  1 tablet (4 mg total) by mouth every 8 (eight) hours as needed for nausea or vomiting.     Results for orders placed or performed during the hospital encounter of 07/04/24 (from the past 48 hours)  Lipase, blood     Status: Abnormal   Collection Time: 07/04/24  3:52 PM  Result Value Ref Range   Lipase 58 (H) 11 - 51 U/L    Comment: Performed at [facility], [address] [place], [place] [address]  Comprehensive metabolic panel     Status: Abnormal   Collection Time: 07/04/24  3:52 PM  Result Value Ref Range   Sodium 142 135 - 145 mmol/L   Potassium 2.9 (L) 3.5 - 5.1 mmol/L   Chloride 104 98 - 111 mmol/L   CO2 29 22 - 32 mmol/L   Glucose, Bld 131 (H) 70 - 99 mg/dL    Comment: Glucose reference range applies only to samples taken after fasting for at least 8 hours.   BUN 17 8 - 23 mg/dL   Creatinine, Ser 8.64 (H) 0.44 - 1.00 mg/dL   Calcium 9.2 8.9 - 89.6 mg/dL   Total Protein 7.8 6.5 - 8.1 g/dL   Albumin 3.6 3.5 - 5.0 g/dL   AST 22 15 -  41 U/L   ALT 18 0 - 44 U/L   Alkaline Phosphatase 61 38 - 126 U/L   Total Bilirubin 0.8 0.0 - 1.2 mg/dL   GFR, Estimated 42 (L) >60 mL/min    Comment: (NOTE) Calculated using the CKD-EPI Creatinine Equation (2021)    Anion gap 9 5 - 15    Comment: Performed at [facility], [address], [place], [place] [address]  CBC with Differential     Status: None   Collection Time: 07/04/24  3:52 PM  Result Value Ref Range   WBC 4.6 4.0 - 10.5 K/uL   RBC 4.15 3.87 - 5.11 MIL/uL   Hemoglobin 12.6 12.0 - 15.0 g/dL   HCT 61.2 63.9 - 53.9 %   MCV 93.3 80.0 - 100.0 fL   MCH 30.4 26.0 - 34.0 pg   MCHC 32.6 30.0 - 36.0 g/dL   RDW 87.5 88.4 - 84.4 %   Platelets 298 150 - 400 K/uL   nRBC 0.0 0.0 - 0.2 %   Neutrophils Relative % 49 %   Neutro Abs 2.3 1.7 - 7.7 K/uL   Lymphocytes Relative 42 %   Lymphs Abs 1.9 0.7 - 4.0 K/uL   Monocytes Relative 7 %   Monocytes Absolute 0.3 0.1 - 1.0 K/uL   Eosinophils  Relative 2 %   Eosinophils Absolute 0.1 0.0 - 0.5 K/uL   Basophils Relative 0 %   Basophils Absolute 0.0 0.0 - 0.1 K/uL   Immature Granulocytes 0 %   Abs Immature Granulocytes 0.01 0.00 - 0.07 K/uL    Comment: Performed at [facility], [address] [place], [place] [address]  Magnesium     Status: None   Collection Time: 07/04/24  7:39 PM  Result Value Ref Range   Magnesium 2.3 1.7 - 2.4 mg/dL    Comment: Performed at [facility], [address], [place], [place] [address]  CBC     Status: Abnormal   Collection Time: 07/05/24  5:36 AM  Result Value Ref Range   WBC 4.9 4.0 - 10.5 K/uL   RBC 3.79 (L) 3.87 - 5.11 MIL/uL   Hemoglobin 11.5 (L) 12.0 - 15.0 g/dL   HCT 64.6 (L) 63.9 - 53.9 %   MCV 93.1 80.0 - 100.0 fL   MCH 30.3 26.0 - 34.0 pg   MCHC 32.6 30.0 - 36.0 g/dL   RDW 87.5 88.4 - 84.4 %   Platelets 252 150 - 400 K/uL   nRBC 0.0 0.0 - 0.2 %    Comment: Performed at [facility], [address], [place], [place] [address]  Comprehensive metabolic panel     Status: Abnormal   Collection Time: 07/05/24  5:36 AM  Result Value Ref Range   Sodium 140 135 - 145 mmol/L   Potassium 3.0 (L) 3.5 - 5.1 mmol/L   Chloride 106 98 - 111 mmol/L   CO2 26 22 - 32 mmol/L   Glucose, Bld 103 (H) 70 - 99 mg/dL    Comment: Glucose reference range applies only to samples taken after fasting for at least 8 hours.   BUN 12 8 - 23 mg/dL   Creatinine, Ser 8.89 (H) 0.44 - 1.00 mg/dL   Calcium 8.5 (L) 8.9 - 10.3 mg/dL   Total Protein 6.2 (L) 6.5 - 8.1 g/dL   Albumin 3.0 (L) 3.5 - 5.0 g/dL   AST 20 15 - 41 U/L   ALT 15 0 - 44  U/L   Alkaline Phosphatase 56 38 - 126 U/L   Total Bilirubin 0.9 0.0 - 1.2 mg/dL   GFR, Estimated 53 (L) >60 mL/min    Comment: (NOTE) Calculated using the CKD-EPI Creatinine Equation (2021)    Anion gap 8 5 - 15    Comment: Performed at [facility], [address], [place], [place] [address]   Basic metabolic panel     Status: Abnormal   Collection Time: 07/06/24  4:41 AM  Result Value Ref Range   Sodium 140 135 - 145 mmol/L   Potassium 3.1 (L) 3.5 - 5.1 mmol/L   Chloride 108 98 - 111 mmol/L   CO2 24 22 - 32 mmol/L   Glucose, Bld 100 (H) 70 - 99 mg/dL    Comment: Glucose reference range applies only to samples taken after fasting for at least 8 hours.   BUN 8 8 - 23 mg/dL   Creatinine, Ser 8.91 (H) 0.44 - 1.00 mg/dL   Calcium 8.3 (L) 8.9 - 10.3 mg/dL   GFR, Estimated 55 (L) >60 mL/min    Comment: (NOTE) Calculated using the CKD-EPI Creatinine Equation (2021)    Anion gap 8 5 - 15    Comment: Performed at [facility], [address] [place], [place] [address]  Lipase, blood     Status: None   Collection Time: 07/06/24  4:41 AM  Result Value Ref Range   Lipase 34 11 - 51 U/L    Comment: Performed at [facility], [address], [place], [place] [address]    CT ABDOMEN PELVIS W CONTRAST Result Date: 07/04/2024 CLINICAL DATA:  Abdominal pain, acute, nonlocalized stated she can only hold down crackers and yogurt, but still vomits. EXAM: CT ABDOMEN AND PELVIS WITH CONTRAST TECHNIQUE: Multidetector CT imaging of the abdomen and pelvis was performed using the standard protocol following bolus administration of intravenous contrast. RADIATION DOSE REDUCTION: This exam was performed according to the departmental dose-optimization program which includes automated exposure control, adjustment of the mA and/or kV according to patient size and/or use of iterative reconstruction technique. CONTRAST:  80mL OMNIPAQUE  IOHEXOL  300 MG/ML  SOLN COMPARISON:  CT abdomen pelvis 06/04/2007 FINDINGS: Lower chest: At least small volume hiatal hernia. No acute abnormality. Hepatobiliary: Pericentimeter fluid dense lesion of the left hepatic lobe likely a simple hepatic cyst. Nonspecific calcification of the right hepatic lobe (2:20). No gallstones, gallbladder wall  thickening, or pericholecystic fluid. No biliary dilatation. Pancreas: No focal lesion. Normal pancreatic contour. No surrounding inflammatory changes. No main pancreatic ductal dilatation. Spleen: Normal in size without focal abnormality. Adrenals/Urinary Tract: No adrenal nodule bilaterally. Bilateral kidneys enhance symmetrically. No hydronephrosis. No hydroureter. The urinary bladder is unremarkable. Stomach/Bowel: Stomach is within normal limits. No evidence of large bowel wall thickening or dilatation. Fluid dilatation of the first and second portion of the duodenum measuring up to 3.3 cm. Under distended third portion of the duodenum and small bowel distally. Question transition point of the third portion of the duodenum with limited evaluation for bowel wall thickening (7:71). Colonic diverticulosis. Appendix appears normal. Vascular/Lymphatic: No abdominal aorta or iliac aneurysm. Moderate atherosclerotic plaque of the aorta and its branches. No abdominal, pelvic, or inguinal lymphadenopathy. Reproductive: Status post hysterectomy. No adnexal masses. Other: No intraperitoneal free fluid. No intraperitoneal free gas. No organized fluid collection. Musculoskeletal: No abdominal wall hernia or abnormality. No suspicious lytic or blastic osseous lesions. No acute displaced fracture. L3-L4 intervertebral disc space vacuum phenomenon, endplate sclerosis,  subchondral degenerative cysts. IMPRESSION: 1. At least small volume hiatal hernia. 2. Fluid dilatation of the first and second portion of the duodenum measuring up to 3.3 cm with under distended third portion of the duodenum and collapsed small bowel distally. Question transition point of the third portion of the duodenum with limited evaluation. Question small bowel obstruction with underlying mass not fully excluded. No bowel perforation. No inflammatory changes of the bowel noted. 3. Colonic diverticulosis with no acute diverticulitis. 4.  Aortic  Atherosclerosis (ICD10-I70.0). Electronically Signed   By: Morgane  Naveau M.D.   On: 07/04/2024 20:35    Review of Systems  Constitutional:  Positive for unexpected weight change.  HENT: Negative.    Eyes: Negative.   Respiratory: Negative.    Cardiovascular: Negative.   Gastrointestinal:  Positive for nausea and vomiting. Negative for abdominal distention, abdominal pain, anal bleeding, blood in stool, constipation and diarrhea.  Endocrine: Negative.   Genitourinary: Negative.   Musculoskeletal: Negative.   Skin: Negative.   Allergic/Immunologic: Negative.   Neurological: Negative.   Hematological: Negative.   Psychiatric/Behavioral: Negative.    All other systems reviewed and are negative.  Blood pressure (!) 152/76, pulse 84, temperature 97.8 F (36.6 C), temperature source Oral, resp. rate 18, height 5' 8.5 (1.74 m), weight 109.3 kg, SpO2 99%. Physical Exam Vitals reviewed.  Constitutional:      General: She is not in acute distress.    Appearance: Normal appearance. She is ill-appearing. She is not toxic-appearing or diaphoretic.  HENT:     Head: Normocephalic and atraumatic.     Right Ear: External ear normal.     Left Ear: External ear normal.     Mouth/Throat:     Mouth: Mucous membranes are moist.     Pharynx: Oropharynx is clear.  Eyes:     General: No scleral icterus.    Extraocular Movements: Extraocular movements intact.     Conjunctiva/sclera: Conjunctivae normal.     Pupils: Pupils are equal, round, and reactive to light.  Cardiovascular:     Rate and Rhythm: Normal rate and regular rhythm.     Pulses: Normal pulses.     Heart sounds: Normal heart sounds. No murmur heard.    No gallop.  Pulmonary:     Effort: Pulmonary effort is normal.     Breath sounds: Normal breath sounds. No wheezing or rhonchi.  Abdominal:     General: Abdomen is flat. There is no distension.     Palpations: Abdomen is soft. There is no mass.     Tenderness: There is no  abdominal tenderness. There is no guarding or rebound.     Hernia: No hernia is present.  Musculoskeletal:        General: No swelling, tenderness, deformity or signs of injury. Normal range of motion.     Cervical back: Normal range of motion and neck supple.  Lymphadenopathy:     Cervical: No cervical adenopathy.  Skin:    General: Skin is warm and dry.     Capillary Refill: Capillary refill takes 2 to 3 seconds.     Coloration: Skin is not jaundiced or pale.     Findings: No bruising or erythema.  Neurological:     General: No focal deficit present.     Mental Status: She is alert and oriented to person, place, and time.     Motor: No weakness.     Coordination: Coordination normal.  Psychiatric:        Mood and  Affect: Mood normal.        Behavior: Behavior normal.        Thought Content: Thought content normal.     Assessment/Plan:  Gastric outlet obstruction from third portion of duodenum Unexpected weight loss Severe protein calorie malnutrition Hypokalemia Hyperglycemia Acute kidney injury with possible chronic kidney disease  GI is consulting- EGD this AM to evaluate for mass vs extrinsic compression. Nutrition consult/nutritional support, will likely need TNA unless GI finds some problem that can be traversed or stented, making her able to tolerate oral nutrition.    Electrolyte abnormalities- replete K and recheck  Ultimately mass is most likely.  Will need to see pictures from EGD and await biopsy pathology.        Jina LITTIE Nephew, MD, FACS, FSSO Surgical Oncology, General Surgery, Trauma and Critical Care [facility], [place] [phone] for weekday/non holidays Check [email or web address] for coverage night/weekend/holidays

## 2024-07-06 NOTE — Anesthesia Postprocedure Evaluation (Signed)
 Anesthesia Post Note  Patient: Lauren Gray  Procedure(s) Performed: ENTEROSCOPY BIOPSY, GI     Patient location during evaluation: Nursing Unit Anesthesia Type: MAC Level of consciousness: awake and alert Pain management: pain level controlled Vital Signs Assessment: post-procedure vital signs reviewed and stable Respiratory status: spontaneous breathing, nonlabored ventilation and respiratory function stable Cardiovascular status: stable and blood pressure returned to baseline Postop Assessment: no apparent nausea or vomiting Anesthetic complications: no   No notable events documented.  Last Vitals:  Vitals:   07/06/24 1040 07/06/24 1101  BP: (!) 175/65 (!) 167/81  Pulse: 72 70  Resp: 15 16  Temp:  36.6 C  SpO2: 98% 98%    Last Pain:  Vitals:   07/06/24 1101  TempSrc: Oral  PainSc:                  [name]

## 2024-07-06 NOTE — Brief Op Note (Signed)
 07/06/2024  10:31 AM  PATIENT:  Lauren Gray  73 y.o. 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  Distorted anatomy with obstructing mass lesion in the 3rd or 4th portion of the duodenum.  I was not able to advance scope beyond this area.  Obtaining biopsies were also difficult because of angulation.  Recommendations ------------------------- - Clear liquid diet for now. -Keep n.p.o. past midnight for small bowel follow-through tomorrow. -Follow biopsy report -GI will follow.  Layla Lah MD, FACP 07/06/2024, 10:32 AM  Contact #  [phone]

## 2024-07-07 ENCOUNTER — Encounter (HOSPITAL_COMMUNITY): Payer: Self-pay | Admitting: Gastroenterology

## 2024-07-07 ENCOUNTER — Inpatient Hospital Stay (HOSPITAL_COMMUNITY)

## 2024-07-07 DIAGNOSIS — K56609 Unspecified intestinal obstruction, unspecified as to partial versus complete obstruction: Secondary | ICD-10-CM | POA: Diagnosis not present

## 2024-07-07 LAB — CBC
HCT: 34.9 % — ABNORMAL LOW (ref 36.0–46.0)
Hemoglobin: 11.5 g/dL — ABNORMAL LOW (ref 12.0–15.0)
MCH: 30.7 pg (ref 26.0–34.0)
MCHC: 33 g/dL (ref 30.0–36.0)
MCV: 93.3 fL (ref 80.0–100.0)
Platelets: 239 K/uL (ref 150–400)
RBC: 3.74 MIL/uL — ABNORMAL LOW (ref 3.87–5.11)
RDW: 12.5 % (ref 11.5–15.5)
WBC: 5.2 K/uL (ref 4.0–10.5)
nRBC: 0 % (ref 0.0–0.2)

## 2024-07-07 LAB — BASIC METABOLIC PANEL WITH GFR
Anion gap: 6 (ref 5–15)
BUN: 9 mg/dL (ref 8–23)
CO2: 21 mmol/L — ABNORMAL LOW (ref 22–32)
Calcium: 8.3 mg/dL — ABNORMAL LOW (ref 8.9–10.3)
Chloride: 112 mmol/L — ABNORMAL HIGH (ref 98–111)
Creatinine, Ser: 1.18 mg/dL — ABNORMAL HIGH (ref 0.44–1.00)
GFR, Estimated: 49 mL/min — ABNORMAL LOW (ref >60)
Glucose, Bld: 101 mg/dL — ABNORMAL HIGH (ref 70–99)
Potassium: 3.6 mmol/L (ref 3.5–5.1)
Sodium: 139 mmol/L (ref 135–145)

## 2024-07-07 LAB — PREALBUMIN: Prealbumin: 18 mg/dL (ref 18–38)

## 2024-07-07 MED ORDER — BOOST / RESOURCE BREEZE PO LIQD CUSTOM: 1.0000 | Freq: Three times a day (TID) | ORAL | Status: DC

## 2024-07-07 MED ORDER — SODIUM CHLORIDE 0.9 % IV SOLN: INTRAVENOUS | Status: DC

## 2024-07-07 NOTE — Consult Note (Signed)
 Jacquelyn P Solt 10:32 AM  Subjective: Patient seen and examined and familiar to me from outpatient care and her hospital computer chart reviewed and her case discussed with my partner Dr. Elicia and both her endoscopic pictures and upper GI was reviewed we answered all of her questions and she is not having any pain believe she can tolerate clear liquids but does not like Ensure but is willing to try soft things like scrambled eggs mashed potatoes milkshakes etc. and we also discussed TPN and the timing of eventual probable surgery  Objective: Vital signs stable afebrile no acute distress abdomen is soft nontender labs reviewed x-rays reviewed path pending  Assessment: Duodenal obstruction with some barium going through  Plan: Okay with me to try clear liquids and will leave to surgical team whether she can have heavier things like milkshakes mashed potatoes as above versus TPN and will get back to her as soon as we get the pathology  [facility]  office [phone] After 5PM or if no answer call [phone]

## 2024-07-07 NOTE — TOC Initial Note (Signed)
 Transition of Care [facility]) - Initial/Assessment Note    Patient Details  Name: Lauren Gray MRN: [id number] Date of Birth: [birth year]  [facility] of [facility] CM/SW Contact:    Alfonse JONELLE Rex, RN Phone Number: 07/07/2024, 12:31 PM  Clinical Narrative:  Patient off floor in procedure, met with spouse and other  family at bedside to introduce role of TOC/NCM , reports patient has an established PCP, no current home care services, no currently using an AD but has DME available at home. TOC will continue to follow.                  Expected Discharge Plan: Home/Self Care Barriers to Discharge: Continued Medical Work up   Patient Goals and CMS Choice Patient states their goals for this hospitalization and ongoing recovery are:: return home          Expected Discharge Plan and Services       Living arrangements for the past 2 months: Single Family Home                                      Prior Living Arrangements/Services Living arrangements for the past 2 months: Single Family Home Lives with:: Spouse Patient language and need for interpreter reviewed:: Yes Do you feel safe going back to the place where you live?: Yes      Need for Family Participation in Patient Care: Yes (Comment) Care giver support system in place?: Yes (comment) Current home services: DME (DME equipment available, not using at this time.) Criminal Activity/Legal Involvement Pertinent to Current Situation/Hospitalization: No - Comment as needed  Activities of Daily Living   ADL Screening (condition at time of admission) Independently performs ADLs?: Yes (appropriate for developmental age) Is the patient deaf or have difficulty hearing?: No Does the patient have difficulty seeing, even when wearing glasses/contacts?: No Does the patient have difficulty concentrating, remembering, or making decisions?: No  Permission Sought/Granted                  Emotional Assessment          Alcohol / Substance Use: Not Applicable Psych Involvement: No (comment)  Admission diagnosis:  SBO (small bowel obstruction) (HCC) [K56.609] Patient Active Problem List   Diagnosis Date Noted   SBO (small bowel obstruction) (HCC) 07/04/2024   AKI (acute kidney injury) (HCC) 07/04/2024   CKD (chronic kidney disease), 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   Hypokalemia 07/04/2024   Constipation 07/04/2024   Elevated lipase 07/04/2024   Prediabetes 06/18/2024   Acid indigestion 06/18/2024   Hyperlipidemia 06/18/2024   Annual physical exam 02/09/2023   Obesity 02/09/2023   Chronic pain of right knee 02/09/2023   Weight gain 05/25/2017   Family history of uterine cancer 05/04/2017   Groin pain 06/11/2015   Family history of breast cancer in female 02/17/2013   H/O vitamin D  deficiency 02/17/2013   Family history of diabetes mellitus 02/17/2013   PCP:  Paseda, Folashade R, FNP Pharmacy:   [facility] [phone] - [place], [place] - [address] AT [address] [place] [id number] Phone: [phone] Fax: [phone]     Social Drivers of Health (SDOH) Social History: SDOH Screenings   Depression (PHQ2-9): Low Risk  (06/18/2024)  Social Connections: Patient Declined (07/05/2024)  Tobacco Use: Low Risk  (07/06/2024)   SDOH Interventions:     Readmission Risk Interventions    07/07/2024  12:30 PM  Readmission Risk Prevention Plan  Post Dischage Appt Complete  Medication Screening Complete  Transportation Screening Complete

## 2024-07-07 NOTE — Plan of Care (Signed)

## 2024-07-07 NOTE — Progress Notes (Signed)
 1 Day Post-Op  Subjective: Patient in [facility] headed down to for SB follow through procedure.  She states her bloating has resolved and she thinks she can start eating again.  Denies abdominal pain  ROS: See above, otherwise other systems negative  Objective: Vital signs in last 24 hours: Temp:  [97.6 F (36.4 C)-99.2 F (37.3 C)] 98.4 F (36.9 C) (08/25 0541) Pulse Rate:  [64-81] 64 (08/25 0541) Resp:  [15-20] 18 (08/25 0541) BP: (149-194)/(60-81) 149/63 (08/25 0541) SpO2:  [93 %-99 %] 99 % (08/25 0541) Last BM Date : 07/02/24  Intake/Output from previous day: 08/24 0701 - 08/25 0700 In: 1055.3 [P.O.:380; I.V.:575.3; IV Piggyback:100] Out: 0  Intake/Output this shift: No intake/output data recorded.  PE: Gen: NAD, sitting in WC Abd: soft, NT, ND  Lab Results:  Recent Labs    07/05/24 0536 07/07/24 0509  WBC 4.9 5.2  HGB 11.5* 11.5*  HCT 35.3* 34.9*  PLT 252 239   BMET Recent Labs    07/06/24 0441 07/07/24 0509  NA 140 139  K 3.1* 3.6  CL 108 112*  CO2 24 21*  GLUCOSE 100* 101*  BUN 8 9  CREATININE 1.08* 1.18*  CALCIUM 8.3* 8.3*   PT/INR No results for input(s): LABPROT, INR in the last 72 hours. CMP     Component Value Date/Time   NA 139 07/07/2024 0509   NA 142 06/18/2024 1445   K 3.6 07/07/2024 0509   CL 112 (H) 07/07/2024 0509   CO2 21 (L) 07/07/2024 0509   GLUCOSE 101 (H) 07/07/2024 0509   BUN 9 07/07/2024 0509   BUN 14 06/18/2024 1445   CREATININE 1.18 (H) 07/07/2024 0509   CREATININE 1.12 (H) 12/27/2021 0846   CALCIUM 8.3 (L) 07/07/2024 0509   PROT 6.2 (L) 07/05/2024 0536   PROT 7.2 06/18/2024 1445   ALBUMIN 3.0 (L) 07/05/2024 0536   ALBUMIN 4.3 06/18/2024 1445   AST 20 07/05/2024 0536   ALT 15 07/05/2024 0536   ALKPHOS 56 07/05/2024 0536   BILITOT 0.9 07/05/2024 0536   BILITOT 0.4 06/18/2024 1445   GFRNONAA 49 (L) 07/07/2024 0509   Lipase     Component Value Date/Time   LIPASE 34 07/06/2024 0441        Studies/Results: No results found.  Anti-infectives: Anti-infectives (From admission, onward)    None        Assessment/Plan Gastric outlet obstruction secondary to mass in D3 -s/p EGD with bx 8/24.  Path pending -I discussed nutritional supplementation today with the patient including a PICC/TNA for reliable nutrition since enteral is not reliable at this time.  She seems very hesitant about this and is not yet fully onboard.  We discussed why oral nutrition is not totally reliable currently and how poor nutrition can lead to surgical complications. -she would like to think about it -path will help determine what type of surgery this patient may need so await these results.  Otherwise further care/workup currently per GI/medicine   FEN - NPO VTE - ok for chemical prophylaxis from our standpoint ID - none currently needed  PCM - see above AKI  I reviewed Consultant GI notes, hospitalist notes, last 24 h vitals and pain scores, last 48 h intake and output, last 24 h labs and trends, and last 24 h imaging results.   LOS: 3 days    Burnard FORBES Banter , [facility] 07/07/2024, 8:31 AM Please see Amion for pager number during day hours 7:00am-4:30pm  or 7:00am -11:30am on weekends

## 2024-07-07 NOTE — Progress Notes (Signed)
 [facility]  PROGRESS NOTE  Lauren Gray FMW:[id number] DOB: [birth year] DOA: 07/04/2024 PCP: Paseda, Folashade R, FNP   Brief HPI:     73 y.o. female with medical history significant of HLD, prediabetes, CKDII does not take any medication on regular basis,  past surgical h/o S/P total hysterectomy and BSO with  presents with vomiting x 1 month, can only hold down crackers and yogurt, has not had a full bowel movement in a few weeks, last vomited this morning, last bm with small pebbles this morning, +flatus, no ab pain, + weight loss of 10lbs in the last months.    Assessment/Plan:   Intermittent nausea/vomiting - CT scan shows dilated 1st and 2nd portion of duodenum with possible underlying mass [facility] gastroenterology consulted:   -Follow biopsy report -GI will follow. - General Surgery consulted and following- gettingg SMALL BOWEL W DOUBLE CM    Constipation  - Regimen as able   Mild elevated lipase -Resolved     Hypokalemia -Replete   AKI on CKDII vs CKD IIIa -Appears resolved  Hypertension - Start IV hydralazine  as needed    Medications     hydrALAZINE   10 mg Intravenous Once   pantoprazole  (PROTONIX ) IV  40 mg Intravenous Q12H     Data Reviewed:   CBG:  No results for input(s): GLUCAP in the last 168 hours.  SpO2: 99 %    Vitals:   07/06/24 1409 07/06/24 2130 07/06/24 2219 07/07/24 0541  BP: (!) 154/63 (!) 170/68 (!) 170/68 (!) 149/63  Pulse: 70 72  64  Resp: 16 17  18   Temp: 98.4 F (36.9 C) 99.2 F (37.3 C)  98.4 F (36.9 C)  TempSrc: Oral Oral  Oral  SpO2: 98% 99%  99%  Weight:      Height:          Data Reviewed:  Basic Metabolic Panel: Recent Labs  Lab 07/04/24 1552 07/04/24 1939 07/05/24 0536 07/06/24 0441 07/07/24 0509  NA 142  --  140 140 139  K 2.9*  --  3.0* 3.1* 3.6  CL 104  --  106 108 112*  CO2 29  --  26 24 21*  GLUCOSE 131*  --  103* 100* 101*  BUN 17  --  12 8 9   CREATININE 1.35*  --  1.10* 1.08*  1.18*  CALCIUM 9.2  --  8.5* 8.3* 8.3*  MG  --  2.3  --   --   --     CBC: Recent Labs  Lab 07/04/24 1552 07/05/24 0536 07/07/24 0509  WBC 4.6 4.9 5.2  NEUTROABS 2.3  --   --   HGB 12.6 11.5* 11.5*  HCT 38.7 35.3* 34.9*  MCV 93.3 93.1 93.3  PLT 298 252 239    LFT Recent Labs  Lab 07/04/24 1552 07/05/24 0536  AST 22 20  ALT 18 15  ALKPHOS 61 56  BILITOT 0.8 0.9  PROT 7.8 6.2*  ALBUMIN 3.6 3.0*     Antibiotics: Anti-infectives (From admission, onward)    None        DVT prophylaxis: SCDs  Code Status: Full code  Family Communication: family at bedside   CONSULTS    Subjective   Denies pain.  No nausea or vomiting   Objective    Physical Examination:   General: Appearance:    Obese female in no acute distress     Lungs:      respirations unlabored  Heart:    Normal  heart rate.   MS:   All extremities are intact.   Neurologic:   Awake, alert     Status is: Inpatient:             Lauren Gray   [facility] If 7PM-7AM, please contact night-coverage at [email or web address], Office  [phone]   07/07/2024, 12:41 PM  LOS: 3 days

## 2024-07-08 ENCOUNTER — Other Ambulatory Visit: Payer: Self-pay

## 2024-07-08 DIAGNOSIS — K56609 Unspecified intestinal obstruction, unspecified as to partial versus complete obstruction: Secondary | ICD-10-CM | POA: Diagnosis not present

## 2024-07-08 LAB — CBC
HCT: 36.9 % (ref 36.0–46.0)
Hemoglobin: 11.7 g/dL — ABNORMAL LOW (ref 12.0–15.0)
MCH: 30.2 pg (ref 26.0–34.0)
MCHC: 31.7 g/dL (ref 30.0–36.0)
MCV: 95.3 fL (ref 80.0–100.0)
Platelets: 245 K/uL (ref 150–400)
RBC: 3.87 MIL/uL (ref 3.87–5.11)
RDW: 12.3 % (ref 11.5–15.5)
WBC: 5.1 K/uL (ref 4.0–10.5)
nRBC: 0 % (ref 0.0–0.2)

## 2024-07-08 LAB — BASIC METABOLIC PANEL WITH GFR
Anion gap: 10 (ref 5–15)
BUN: 13 mg/dL (ref 8–23)
CO2: 19 mmol/L — ABNORMAL LOW (ref 22–32)
Calcium: 8.4 mg/dL — ABNORMAL LOW (ref 8.9–10.3)
Chloride: 111 mmol/L (ref 98–111)
Creatinine, Ser: 1.19 mg/dL — ABNORMAL HIGH (ref 0.44–1.00)
GFR, Estimated: 49 mL/min — ABNORMAL LOW (ref >60)
Glucose, Bld: 69 mg/dL — ABNORMAL LOW (ref 70–99)
Potassium: 3.6 mmol/L (ref 3.5–5.1)
Sodium: 140 mmol/L (ref 135–145)

## 2024-07-08 LAB — GLUCOSE, CAPILLARY: Glucose-Capillary: 123 mg/dL — ABNORMAL HIGH (ref 70–99)

## 2024-07-08 LAB — SURGICAL PATHOLOGY

## 2024-07-08 MED ORDER — DEXTROSE-SODIUM CHLORIDE 5-0.45 % IV SOLN: INTRAVENOUS | Status: DC

## 2024-07-08 MED ORDER — CHLORHEXIDINE GLUCONATE CLOTH 2 % EX PADS
6.0000 | MEDICATED_PAD | Freq: Every day | CUTANEOUS | Status: DC
  Administered 2024-07-08 – 2024-07-22 (×16): 6 via TOPICAL

## 2024-07-08 MED ORDER — METHOCARBAMOL 1000 MG/10ML IJ SOLN
500.0000 mg | Freq: Three times a day (TID) | INTRAMUSCULAR | Status: DC | PRN
  Administered 2024-07-08: 500 mg via INTRAVENOUS
  Filled 2024-07-08: qty 10

## 2024-07-08 MED ORDER — THIAMINE HCL 100 MG/ML IJ SOLN
100.0000 mg | Freq: Once | INTRAMUSCULAR | Status: AC
  Administered 2024-07-08: 100 mg via INTRAVENOUS
  Filled 2024-07-08: qty 2

## 2024-07-08 MED ORDER — INSULIN ASPART 100 UNIT/ML IJ SOLN
0.0000 [IU] | Freq: Four times a day (QID) | INTRAMUSCULAR | Status: DC
  Administered 2024-07-08: 1 [IU] via SUBCUTANEOUS
  Administered 2024-07-09 (×2): 2 [IU] via SUBCUTANEOUS
  Administered 2024-07-09 – 2024-07-10 (×2): 1 [IU] via SUBCUTANEOUS
  Administered 2024-07-10 – 2024-07-11 (×4): 2 [IU] via SUBCUTANEOUS
  Administered 2024-07-11: 1 [IU] via SUBCUTANEOUS
  Administered 2024-07-11: 2 [IU] via SUBCUTANEOUS
  Administered 2024-07-12: 1 [IU] via SUBCUTANEOUS

## 2024-07-08 MED ORDER — PROCHLORPERAZINE EDISYLATE 10 MG/2ML IJ SOLN
5.0000 mg | Freq: Once | INTRAMUSCULAR | Status: AC | PRN
Start: 2024-07-08 — End: 2024-07-08
  Administered 2024-07-08: 5 mg via INTRAVENOUS
  Filled 2024-07-08: qty 2

## 2024-07-08 MED ORDER — SODIUM CHLORIDE 0.9% FLUSH: 10.0000 mL | INTRAVENOUS | Status: DC | PRN

## 2024-07-08 MED ORDER — TRAVASOL 10 % IV SOLN
INTRAVENOUS | Status: AC
  Filled 2024-07-08: qty 480

## 2024-07-08 MED ORDER — THIAMINE HCL 100 MG/ML IJ SOLN
100.0000 mg | INTRAMUSCULAR | Status: AC
  Administered 2024-07-09 – 2024-07-12 (×4): 100 mg via INTRAVENOUS
  Filled 2024-07-08 (×4): qty 2

## 2024-07-08 NOTE — Progress Notes (Signed)
 [facility]  PROGRESS NOTE  Lauren Gray FMW:[id number] DOB: Apr 25, 1951 DOA: 07/04/2024 PCP: Paseda, Folashade R, FNP   Brief HPI:     73 y.o. female with medical history significant of HLD, prediabetes, CKDII does not take any medication on regular basis,  past surgical h/o S/P total hysterectomy and BSO with  presents with vomiting x 1 month, can only hold down crackers and yogurt, has not had a full bowel movement in a few weeks, last vomited this morning, last bm with small pebbles this morning, +flatus, no ab pain, + weight loss of 10lbs in the last months.  Found to have gastric outlet obstruction related to a mass.  Pathology pending.  Patient considering starting TPN, would like to discuss with her husband first.   Assessment/Plan:   Intermittent nausea/vomiting - CT scan shows dilated 1st and 2nd portion of duodenum with possible underlying mass [facility] gastroenterology consulted:   -Follow biopsy report -GI will follow. - General Surgery consulted and following-pathology still pending, recommend PICC line and TNA for reliable nutrition.   Constipation  - Regimen as able   Mild elevated lipase -Resolved     Hypokalemia -Replete   AKI on CKDII vs CKD IIIa -Appears resolved  Hypertension - Start IV hydralazine  as needed  Hypoglycemia - Due to poor p.o. intake, start D5 for now  Medications     feeding supplement  1 Container Oral TID BM   hydrALAZINE   10 mg Intravenous Once   pantoprazole  (PROTONIX ) IV  40 mg Intravenous Q12H     Data Reviewed:   CBG:  No results for input(s): GLUCAP in the last 168 hours.  SpO2: 97 %    Vitals:   07/07/24 0541 07/07/24 1314 07/07/24 2055 07/08/24 0601  BP: (!) 149/63 (!) 157/66 (!) 186/58 (!) 146/45  Pulse: 64 79 66 64  Resp: 18 16 17 18   Temp: 98.4 F (36.9 C) 98 F (36.7 C) 98.5 F (36.9 C) 98.1 F (36.7 C)  TempSrc: Oral  Oral Oral  SpO2: 99% 100% 96% 97%  Weight:      Height:          Data  Reviewed:  Basic Metabolic Panel: Recent Labs  Lab 07/04/24 1552 07/04/24 1939 07/05/24 0536 07/06/24 0441 07/07/24 0509 07/08/24 0623  NA 142  --  140 140 139 140  K 2.9*  --  3.0* 3.1* 3.6 3.6  CL 104  --  106 108 112* 111  CO2 29  --  26 24 21* 19*  GLUCOSE 131*  --  103* 100* 101* 69*  BUN 17  --  12 8 9 13   CREATININE 1.35*  --  1.10* 1.08* 1.18* 1.19*  CALCIUM 9.2  --  8.5* 8.3* 8.3* 8.4*  MG  --  2.3  --   --   --   --     CBC: Recent Labs  Lab 07/04/24 1552 07/05/24 0536 07/07/24 0509 07/08/24 0623  WBC 4.6 4.9 5.2 5.1  NEUTROABS 2.3  --   --   --   HGB 12.6 11.5* 11.5* 11.7*  HCT 38.7 35.3* 34.9* 36.9  MCV 93.3 93.1 93.3 95.3  PLT 298 252 239 245    LFT Recent Labs  Lab 07/04/24 1552 07/05/24 0536  AST 22 20  ALT 18 15  ALKPHOS 61 56  BILITOT 0.8 0.9  PROT 7.8 6.2*  ALBUMIN 3.6 3.0*     Antibiotics: Anti-infectives (From admission, onward)    None  DVT prophylaxis: SCDs  Code Status: Full code  Family Communication: family at bedside   CONSULTS    Subjective   Had episode of vomiting last night   Objective    Physical Examination:   General: Appearance:    Obese female in no acute distress     Lungs:      respirations unlabored  Heart:    Normal heart rate.   MS:   All extremities are intact.   Neurologic:   Awake, alert     Status is: Inpatient:             Harlene RAYMOND Bowl   [facility] If 7PM-7AM, please contact night-coverage at [email or web address], Office  [phone]   07/08/2024, 10:17 AM  LOS: 4 days

## 2024-07-08 NOTE — Progress Notes (Signed)
 Informed J. Blondie, NP of pt being given 4mg  of Zofran  followed by of emesis. New orders received for Compazine .

## 2024-07-08 NOTE — Progress Notes (Signed)
 I had a long talk with patient about her biopsies including multiple family members and we discussed probable surgical options first and possible chemotherapy we answered all of their questions but I did not call an oncologist and will await surgical opinion in the morning to see how to proceed

## 2024-07-08 NOTE — Progress Notes (Signed)
 Peripherally Inserted Central Catheter Placement  The IV Nurse has discussed with the patient and/or persons authorized to consent for the patient, the purpose of this procedure and the potential benefits and risks involved with this procedure.  The benefits include less needle sticks, lab draws from the catheter, and the patient may be discharged home with the catheter. Risks include, but not limited to, infection, bleeding, blood clot (thrombus formation), and puncture of an artery; nerve damage and irregular heartbeat and possibility to perform a PICC exchange if needed/ordered by physician.  Alternatives to this procedure were also discussed.  Bard Power PICC patient education guide, fact sheet on infection prevention and patient information card has been provided to patient /or left at bedside.    PICC Placement Documentation  PICC Double Lumen 07/08/24 Right Basilic 40 cm 0 cm (Active)  Indication for Insertion or Continuance of Line Administration of hyperosmolar/irritating solutions (i.e. TPN, Vancomycin, etc.) 07/08/24 1446  Exposed Catheter (cm) 0 cm 07/08/24 1446  Site Assessment Clean, Dry, Intact 07/08/24 1446  Lumen #1 Status Flushed;Saline locked;Blood return noted 07/08/24 1446  Lumen #2 Status Flushed;Saline locked;Blood return noted 07/08/24 1446  Dressing Type Transparent;Securing device 07/08/24 1446  Dressing Status Antimicrobial disc/dressing in place;Clean, Dry, Intact 07/08/24 1446  Line Care Connections checked and tightened 07/08/24 1446  Line Adjustment (NICU/IV Team Only) No 07/08/24 1446  Dressing Intervention New dressing 07/08/24 1446  Dressing Change Due 07/15/24 07/08/24 1446       [name] 07/08/2024, 2:47 PM

## 2024-07-08 NOTE — Progress Notes (Signed)
 Initial Nutrition Assessment  DOCUMENTATION CODES:   Obesity unspecified  INTERVENTION:   Monitor magnesium, potassium, and phosphorus daily for at least 3 days, MD to replete as needed, as pt is at risk for refeeding syndrome.  -Continue Thiamine  100 mg daily for at least 5 days   -TPN management per Pharmacy -Daily weights while on TPN  NUTRITION DIAGNOSIS:   Inadequate oral intake related to nausea, vomiting (GOO r/t duodenal mass) as evidenced by per patient/family report.  GOAL:   Patient will meet greater than or equal to 90% of their needs  MONITOR:    (TPN)  REASON FOR ASSESSMENT:   Consult New TPN/TNA  ASSESSMENT:   73 y.o. female with medical history significant of HLD, prediabetes, CKDII does not take any medication on regular basis,  past surgical h/o S/P total hysterectomy and BSO with  presents with vomiting x 1 month, can only hold down crackers and yogurt, has not had a full bowel movement in a few weeks, last vomited this morning, last bm with small pebbles this morning, +flatus, no ab pain, + weight loss of 10lbs in the last months.  Found to have gastric outlet obstruction related to a mass.  Patient in room, multiple family members in room. Pt reports last tolerating solid food about 2-3 weeks ago when she ate a meal at [employer], pork, greens, baked potato, apple cobbler and ice cream. Since then she has eaten but most everything has come back up. Has had a few saltines stay down as well as room temp Pepsi. Vomiting has continued this admission, with last vomiting episode last night (~1L).  Pt was awaiting PICC placement. Per family pt with some anxiety about placement but also she does not want to see the TPN bag when hung. Requests it be covered in some way.  Pt denies issues with chewing or taste. Reports food would get choked up in her throat but this is related to vomiting.  Pt reports before symptoms she was taking a daily MVI.  TPN to begin tonight  at 40 ml/hr, providing 921 kcals and 48g protein.  Medications: Thiamine , D5 infusion, Compazine   Labs reviewed.  NUTRITION - FOCUSED PHYSICAL EXAM:  Flowsheet Row Most Recent Value  Orbital Region No depletion  Upper Arm Region No depletion  Thoracic and Lumbar Region No depletion  Buccal Region No depletion  Temple Region No depletion  Clavicle Bone Region No depletion  Clavicle and Acromion Bone Region No depletion  Scapular Bone Region No depletion  Dorsal Hand Mild depletion  Patellar Region No depletion  Anterior Thigh Region No depletion  Posterior Calf Region No depletion  Edema (RD Assessment) None  Hair Unable to assess  [covered]  Eyes Reviewed  Mouth Reviewed  Skin Reviewed  Nails Reviewed    Diet Order:   Diet Order             Diet clear liquid Room service appropriate? Yes; Fluid consistency: Thin  Diet effective now                   EDUCATION NEEDS:   Education needs have been addressed  Skin:  Skin Assessment: Reviewed RN Assessment  Last BM:  8/20  Height:   Ht Readings from Last 1 Encounters:  07/05/24 5' 8.5 (1.74 m)    Weight:   Wt Readings from Last 1 Encounters:  07/05/24 109.3 kg    BMI:  Body mass index is 36.11 kg/m.  Estimated Nutritional Needs:  Kcal:  1700-1900  Protein:  75-95g  Fluid:  1.9L/day   Morna Lee, MS, RD, LDN Inpatient Clinical Dietitian Contact via Secure chat

## 2024-07-08 NOTE — Progress Notes (Addendum)
 2 Days Post-Op  Subjective: Vomited 1L overnight.  Feels better this am after that.  No new complaints currently.    ROS: See above, otherwise other systems negative  Objective: Vital signs in last 24 hours: Temp:  [98 F (36.7 C)-98.5 F (36.9 C)] 98.1 F (36.7 C) (08/26 0601) Pulse Rate:  [64-79] 64 (08/26 0601) Resp:  [16-18] 18 (08/26 0601) BP: (146-186)/(45-66) 146/45 (08/26 0601) SpO2:  [96 %-100 %] 97 % (08/26 0601) Last BM Date : 07/02/24  Intake/Output from previous day: 08/25 0701 - 08/26 0700 In: 724.4 [P.O.:60; I.V.:664.4] Out: 1000 [Emesis/NG output:1000] Intake/Output this shift: Total I/O In: 240 [P.O.:240] Out: -   PE: Gen: NAD, lying in bed Abd: soft, NT, +BS, mild bloating  Lab Results:  Recent Labs    07/07/24 0509 07/08/24 0623  WBC 5.2 5.1  HGB 11.5* 11.7*  HCT 34.9* 36.9  PLT 239 245   BMET Recent Labs    07/07/24 0509 07/08/24 0623  NA 139 140  K 3.6 3.6  CL 112* 111  CO2 21* 19*  GLUCOSE 101* 69*  BUN 9 13  CREATININE 1.18* 1.19*  CALCIUM 8.3* 8.4*   PT/INR No results for input(s): LABPROT, INR in the last 72 hours. CMP     Component Value Date/Time   NA 140 07/08/2024 0623   NA 142 06/18/2024 1445   K 3.6 07/08/2024 0623   CL 111 07/08/2024 0623   CO2 19 (L) 07/08/2024 0623   GLUCOSE 69 (L) 07/08/2024 0623   BUN 13 07/08/2024 0623   BUN 14 06/18/2024 1445   CREATININE 1.19 (H) 07/08/2024 0623   CREATININE 1.12 (H) 12/27/2021 0846   CALCIUM 8.4 (L) 07/08/2024 0623   PROT 6.2 (L) 07/05/2024 0536   PROT 7.2 06/18/2024 1445   ALBUMIN 3.0 (L) 07/05/2024 0536   ALBUMIN 4.3 06/18/2024 1445   AST 20 07/05/2024 0536   ALT 15 07/05/2024 0536   ALKPHOS 56 07/05/2024 0536   BILITOT 0.9 07/05/2024 0536   BILITOT 0.4 06/18/2024 1445   GFRNONAA 49 (L) 07/08/2024 0623   Lipase     Component Value Date/Time   LIPASE 34 07/06/2024 0441       Studies/Results: DG SMALL BOWEL W DOUBLE CM (HD) Result Date:  07/07/2024 CLINICAL DATA:  Duodenal mass EXAM: SMALL BOWEL SERIES COMPARISON:  CT abdomen 07/04/2024 TECHNIQUE: Following ingestion of thin barium, serial small bowel images were obtained including spot views of the terminal ileum. FLUOROSCOPY: Radiation Exposure Index (if provided by the fluoroscopic device): 56.6 mGy air kerma FINDINGS: Initial KUB demonstrates mild dextroconvex lumbar scoliosis with rotary component and spondylosis the L3-4 and L4-5 levels. Unremarkable bowel gas pattern. Initial overhead image of the abdomen demonstrates a suspected small hiatal hernia along with a truncated barium column mid transverse duodenum. Barium was very sluggish to traverse through the 3.5 cm stenotic region of the mid transverse abdominal aorta which appears severely strictured, resulting in a mildly distended stomach and proximal duodenum compatible with mild partial obstruction. Contrast did make its way past this region, but at the 4 hour and 15 minute mark extending through most of the ileum but not yet to the cecum, slow transit attributed to the duodenal mass. The patient indicated at this point that she was ready for the exam to be finished, and accordingly I performed compression fluoroscopy along the region of the severe transverse duodenal stricture and also throughout the rest of the visualized small bowel which encompasses nearly all of  the small bowel. Aside from the region of the duodenal mass, the remaining visualized small bowel appears normal with expected caliber and fold pattern and no abnormal filling defects upon compression. IMPRESSION: 1. 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 This region has already been biopsied and pathology is pending. The stricture causes a mild to moderate partial obstruction with slow transit; at 4 hours and 15 minutes when the patient elected to terminate further imaging, almost all but not  the entirety of the small bowel was opacified. The remaining visualized small bowel appear normal. Electronically Signed   By: Ryan Salvage M.D.   On: 07/07/2024 14:08   DG Fluoro Rm 1-60 Min - No Report Result Date: 07/07/2024 Fluoroscopy was utilized by the requesting physician.  No radiographic interpretation.   DG Fluoro Rm 1-60 Min - No Report Result Date: 07/07/2024 Fluoroscopy was utilized by the requesting physician.  No radiographic interpretation.   DG Fluoro Rm 1-60 Min - No Report Result Date: 07/07/2024 Fluoroscopy was utilized by the requesting physician.  No radiographic interpretation.    Anti-infectives: Anti-infectives (From admission, onward)    None        Assessment/Plan Gastric outlet obstruction secondary to mass in D3 -s/p EGD with bx 8/24.  Path pending -I discussed nutritional supplementation today with the patient including a PICC/TNA for reliable nutrition since enteral is not reliable at this time.  She would like to discuss this with her daughter and husband before agreement.  We discussed that the Breeze I added yesterday, I was hopeful, she could tolerate, but appears she won't be able to tolerate enough CLD for this to make a difference.  We discussed how important maintaining nutrition is going into surgery.  Prealbumin is currently 18. -path will help determine what type of surgery this patient may need so await these results.  -daughter and family friend at bedside.  Discussion had with them present as well  FEN - CLD still in place VTE - ok for chemical prophylaxis from our standpoint ID - none currently needed  PCM - see above AKI  I reviewed Consultant GI notes, hospitalist notes, last 24 h vitals and pain scores, last 48 h intake and output, last 24 h labs and trends, and last 24 h imaging results.   LOS: 4 days    Burnard FORBES Banter , [facility] Surgery 07/08/2024, 9:58 AM Please see Amion for pager number during day  hours 7:00am-4:30pm or 7:00am -11:30am on weekends

## 2024-07-08 NOTE — Progress Notes (Signed)
 PHARMACY - TOTAL PARENTERAL NUTRITION CONSULT NOTE   Indication: Gastric outlet obstruction 2nd to mass  Patient Measurements: Height: 5' 8.5 (174 cm) Weight: 109.3 kg (241 lb) IBW/kg (Calculated) : 65.05 TPN AdjBW (KG): 76.1 Body mass index is 36.11 kg/m. Usual Weight:   Assessment: Pharmacy consulted to manage TPN for nutrition support in [name] with gastric outlet obstruction 2nd mass.  PMH: HLD, prediabetes, CKDII does not take any medication on regular basis, past surgical h/o S/P total hysterectomy and BSO   Glucose / Insulin : hx of prediabetes, not on medication PTA Serum glucose all < 150 Electrolytes:  all WNL including CoCa Renal: AKI on  CKDII vs CKD IIIa resolved. SCr 1.19, BUN WNL Hepatic: LFTs WNL 8/25 prealbumin 18 - low end of normal  Intake / Output; MIVF:  MIVF: D545NS at 40 ml/hr x 1 day per TRH PO intake: 60 mls 8/25; 240 mls 8/26 so far 8/25 1000 ml emesis No BM recorded. Last BM 8/20 per flowsheet GI Imaging:  8/22 CT A/P: ? SBO from mass 8/25: DG small bowel: severe stricture of transverse duodenum suspicious for mass & malignancy GI Surgeries / Procedures:   Central access: PICC ordered 8/26 for TPN TPN start date:   Nutritional Goals: Goal TPN rate is 80 mL/hr (provides 96  g of protein and 1843 kcals per day)  RD Assessment: Estimated Needs Total Energy Estimated Needs: 1700-1900 Total Protein Estimated Needs: 75-95 grams Total Fluid Estimated Needs: >/= 1.9 L   Current Nutrition:  Clear liquids  Plan:  Start TPN at 40 mL/hr at 1800 and f/u tolerance Electrolytes in TPN: Na 45mEq/L, K 51mEq/L, Ca 51mEq/L, Mg 46mEq/L, and Phos 15mmol/L. Cl:Ac 1:1 Add standard MVI and trace elements to TPN Initiate Sensitive q6h SSI and adjust as needed  Thiamine  100 mg IV qday x 5 days MIVF D545NS @ 40 mL/hr x 24 hrs per TRH Monitor TPN labs on Mon/Thurs, and PRN   Rosaline IVAR Edison, Pharm.D Use secure chat for questions 07/08/2024 11:05 AM

## 2024-07-09 DIAGNOSIS — C170 Malignant neoplasm of duodenum: Secondary | ICD-10-CM

## 2024-07-09 DIAGNOSIS — K56609 Unspecified intestinal obstruction, unspecified as to partial versus complete obstruction: Secondary | ICD-10-CM | POA: Diagnosis not present

## 2024-07-09 DIAGNOSIS — E876 Hypokalemia: Secondary | ICD-10-CM | POA: Diagnosis not present

## 2024-07-09 LAB — COMPREHENSIVE METABOLIC PANEL WITH GFR
ALT: 16 U/L (ref 0–44)
AST: 23 U/L (ref 15–41)
Albumin: 3.5 g/dL (ref 3.5–5.0)
Alkaline Phosphatase: 63 U/L (ref 38–126)
Anion gap: 12 (ref 5–15)
BUN: 10 mg/dL (ref 8–23)
CO2: 20 mmol/L — ABNORMAL LOW (ref 22–32)
Calcium: 8.7 mg/dL — ABNORMAL LOW (ref 8.9–10.3)
Chloride: 106 mmol/L (ref 98–111)
Creatinine, Ser: 1.15 mg/dL — ABNORMAL HIGH (ref 0.44–1.00)
GFR, Estimated: 50 mL/min — ABNORMAL LOW (ref >60)
Glucose, Bld: 163 mg/dL — ABNORMAL HIGH (ref 70–99)
Potassium: 3.7 mmol/L (ref 3.5–5.1)
Sodium: 138 mmol/L (ref 135–145)
Total Bilirubin: 0.7 mg/dL (ref 0.0–1.2)
Total Protein: 6.6 g/dL (ref 6.5–8.1)

## 2024-07-09 LAB — GLUCOSE, CAPILLARY
Glucose-Capillary: 156 mg/dL — ABNORMAL HIGH (ref 70–99)
Glucose-Capillary: 132 mg/dL — ABNORMAL HIGH (ref 70–99)
Glucose-Capillary: 149 mg/dL — ABNORMAL HIGH (ref 70–99)

## 2024-07-09 LAB — CBC
HCT: 37.4 % (ref 36.0–46.0)
Hemoglobin: 11.7 g/dL — ABNORMAL LOW (ref 12.0–15.0)
MCH: 29.3 pg (ref 26.0–34.0)
MCHC: 31.3 g/dL (ref 30.0–36.0)
MCV: 93.7 fL (ref 80.0–100.0)
Platelets: 245 K/uL (ref 150–400)
RBC: 3.99 MIL/uL (ref 3.87–5.11)
RDW: 12.3 % (ref 11.5–15.5)
WBC: 5.4 K/uL (ref 4.0–10.5)
nRBC: 0 % (ref 0.0–0.2)

## 2024-07-09 LAB — PHOSPHORUS: Phosphorus: 2.6 mg/dL (ref 2.5–4.6)

## 2024-07-09 LAB — MAGNESIUM: Magnesium: 2.2 mg/dL (ref 1.7–2.4)

## 2024-07-09 MED ORDER — GLYCERIN (LAXATIVE) 2 G RE SUPP: 1.0000 | RECTAL | Status: DC | PRN

## 2024-07-09 MED ORDER — ONDANSETRON HCL 4 MG/2ML IJ SOLN
4.0000 mg | Freq: Once | INTRAMUSCULAR | Status: AC
  Administered 2024-07-09: 4 mg via INTRAVENOUS
  Filled 2024-07-09: qty 2

## 2024-07-09 MED ORDER — TRAVASOL 10 % IV SOLN
INTRAVENOUS | Status: AC
  Filled 2024-07-09: qty 720

## 2024-07-09 MED ORDER — MELATONIN 5 MG PO TABS
5.0000 mg | ORAL_TABLET | Freq: Every evening | ORAL | Status: DC | PRN
  Administered 2024-07-09: 5 mg via ORAL
  Filled 2024-07-09: qty 1

## 2024-07-09 MED ORDER — SODIUM CHLORIDE 0.9 % IV SOLN
12.5000 mg | Freq: Four times a day (QID) | INTRAVENOUS | Status: DC | PRN
  Administered 2024-07-10: 12.5 mg via INTRAVENOUS
  Filled 2024-07-09: qty 12.5

## 2024-07-09 NOTE — Progress Notes (Signed)
 Patient seen and examined and case discussed with the surgical team and she is nauseated with trying to eat which we discussed and we again discussed surgical options and answered all of her and her family's questions and will check back tomorrow for moral support and please let us  know if we could be of any help during this hospital stay

## 2024-07-09 NOTE — Progress Notes (Addendum)
 PATIENT NAVIGATOR PROGRESS NOTE  Name: ROSANN GORUM Date: 07/09/2024 MRN: [id number]  DOB: Jun 09, 1951   Oncology referral received for this patient. Patient is currently admitted at [facility].  Reached out via phone and introduced self. Patient's address on file is [place], [place].  Patient prefers to be seen at the [facility] location.  Will follow-up with patient once she is discharged to schedule Initial Outpatient Oncology visit with our office.

## 2024-07-09 NOTE — Progress Notes (Signed)
 Pt requested medication to help her sleep. Messaged Lavanda Horns, NP of this request. Orders were received.

## 2024-07-09 NOTE — Progress Notes (Signed)
 [facility]  PROGRESS NOTE  Lauren Gray FMW:[id number] DOB: 1951-05-23 DOA: 07/04/2024 PCP: Paseda, Folashade R, FNP   Brief HPI:     73 y.o. female with medical history significant of HLD, prediabetes, CKDII does not take any medication on regular basis,  past surgical h/o S/P total hysterectomy and BSO with  presents with vomiting x 1 month, can only hold down crackers and yogurt, has not had a full bowel movement in a few weeks, last vomited this morning, last bm with small pebbles this morning, +flatus, no ab pain, + weight loss of 10lbs in the last months.  Found to have gastric outlet obstruction related to a mass.  Pathology pending.  Patient considering starting TPN, would like to discuss with her husband first.   Subjective   Feels well this morning.  Did not have any vomiting overnight.  Denies any abdominal pain currently.   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 Patient was seen by gastroenterology and underwent small bowel enteroscopy.  The duodenal mass was biopsied.  Pathology is positive for adenocarcinoma. General surgery is following.  Patient has poor nutritional status.  Due to gastric outlet obstruction from the duodenal mass patient has been started on TPN.  Surgery is tentatively planned for mid September in order for her nutritional status to improve. Continue TPN for now.   Constipation  May need suppositories.   Mild elevated lipase -Resolved   Hypokalemia Supplemented   AKI on CKDII vs CKD IIIa -Appears resolved  Essential hypertension IV hydralazine  as needed  Hypoglycemia Now on TPN.  IV fluids can be discontinued.   DVT prophylaxis: SCDs Code Status: Full code Family Communication: family at bedside Disposition: To be determined    Medications    Chlorhexidine  Gluconate Cloth  6 each Topical Daily   feeding supplement  1 Container Oral TID  BM   insulin  aspart  0-9 Units Subcutaneous Q6H   pantoprazole  (PROTONIX ) IV  40 mg Intravenous Q12H   thiamine  (VITAMIN B1) injection  100 mg Intravenous Q24H    Objective    SpO2: 95 %    Vitals:   07/08/24 1350 07/08/24 2210 07/09/24 0500 07/09/24 0558  BP: (!) 173/64 (!) 168/54  (!) 167/71  Pulse: 67 72  71  Resp:  18  18  Temp: 97.9 F (36.6 C) 98.8 F (37.1 C)  98.1 F (36.7 C)  TempSrc: Oral Oral  Oral  SpO2: 98% 97%  95%  Weight:   110 kg   Height:        Physical Examination:  General appearance: Awake alert.  In no distress Resp: Clear to auscultation bilaterally.  Normal effort Cardio: S1-S2 is normal regular.  No S3-S4.  No rubs murmurs or bruit GI: Abdomen is soft.  Nontender nondistended.  Bowel sounds are present normal.  No masses organomegaly Extremities: No edema.  Full range of motion of lower extremities. Neurologic: Alert and oriented x3.  No focal neurological deficits.     Data Reviewed:  Basic Metabolic Panel: Recent Labs  Lab 07/04/24 1939 07/05/24 0536 07/06/24 0441 07/07/24 0509 07/08/24 0623 07/09/24 0204  NA  --  140 140 139 140 138  K  --  3.0* 3.1* 3.6 3.6 3.7  CL  --  106 108 112* 111 106  CO2  --  26 24 21* 19* 20*  GLUCOSE  --  103* 100* 101* 69* 163*  BUN  --  12 8 9 13 10   CREATININE  --  1.10* 1.08* 1.18* 1.19* 1.15*  CALCIUM  --  8.5* 8.3* 8.3* 8.4* 8.7*  MG 2.3  --   --   --   --  2.2  PHOS  --   --   --   --   --  2.6    CBC: Recent Labs  Lab 07/04/24 1552 07/05/24 0536 07/07/24 0509 07/08/24 0623 07/09/24 0204  WBC 4.6 4.9 5.2 5.1 5.4  NEUTROABS 2.3  --   --   --   --   HGB 12.6 11.5* 11.5* 11.7* 11.7*  HCT 38.7 35.3* 34.9* 36.9 37.4  MCV 93.3 93.1 93.3 95.3 93.7  PLT 298 252 239 245 245    LFT Recent Labs  Lab 07/04/24 1552 07/05/24 0536 07/09/24 0204  AST 22 20 23   ALT 18 15 16   ALKPHOS 61 56 63  BILITOT 0.8 0.9 0.7  PROT 7.8 6.2* 6.6  ALBUMIN 3.6 3.0* 3.5     Oliverio Cho   [facility] If 7PM-7AM, please contact night-coverage at [email or web address], Office  [phone]   07/09/2024, 11:18 AM  LOS: 5 days

## 2024-07-09 NOTE — TOC Progression Note (Signed)
 Transition of Care [facility]) - Progression Note    Patient Details  Name: Lauren Gray MRN: [id number] Date of Birth: 03-Jul-1951  Transition of Care [facility]) CM/SW Contact  Alfonse JONELLE Rex, RN Phone Number: 07/09/2024, 10:06 AM  Clinical Narrative: started on TPN, surgery following. patient /family processing new diagnosis. TOC will continue to follow.     Expected Discharge Plan: Home/Self Care Barriers to Discharge: Continued Medical Work up               Expected Discharge Plan and Services       Living arrangements for the past 2 months: Single Family Home                                       Social Drivers of Health (SDOH) Interventions SDOH Screenings   Depression (PHQ2-9): Low Risk  (06/18/2024)  Social Connections: Patient Declined (07/05/2024)  Tobacco Use: Low Risk  (07/06/2024)    Readmission Risk Interventions    07/07/2024   12:30 PM  Readmission Risk Prevention Plan  Post Dischage Appt Complete  Medication Screening Complete  Transportation Screening Complete

## 2024-07-09 NOTE — Plan of Care (Signed)

## 2024-07-09 NOTE — Progress Notes (Cosign Needed Addendum)
 3 Days Post-Op  Subjective: No further vomiting since the night before last, feels bloated at times and nauseated.  Feels like she needs to have a BM.  Pathology discussed.  ROS: See above, otherwise other systems negative  Objective: Vital signs in last 24 hours: Temp:  [97.9 F (36.6 C)-98.8 F (37.1 C)] 98.1 F (36.7 C) (08/27 0558) Pulse Rate:  [67-72] 71 (08/27 0558) Resp:  [18] 18 (08/27 0558) BP: (167-173)/(54-71) 167/71 (08/27 0558) SpO2:  [95 %-98 %] 95 % (08/27 0558) Weight:  [110 kg] 110 kg (08/27 0500) Last BM Date : 07/02/24  Intake/Output from previous day: 08/26 0701 - 08/27 0700 In: 1808.7 [P.O.:1080; I.V.:728.7] Out: -  Intake/Output this shift: No intake/output data recorded.  PE: Gen: NAD, lying in bed Abd: soft, NT, +BS, mild bloating  Lab Results:  Recent Labs    07/08/24 0623 07/09/24 0204  WBC 5.1 5.4  HGB 11.7* 11.7*  HCT 36.9 37.4  PLT 245 245   BMET Recent Labs    07/08/24 0623 07/09/24 0204  NA 140 138  K 3.6 3.7  CL 111 106  CO2 19* 20*  GLUCOSE 69* 163*  BUN 13 10  CREATININE 1.19* 1.15*  CALCIUM 8.4* 8.7*   PT/INR No results for input(s): LABPROT, INR in the last 72 hours. CMP     Component Value Date/Time   NA 138 07/09/2024 0204   NA 142 06/18/2024 1445   K 3.7 07/09/2024 0204   CL 106 07/09/2024 0204   CO2 20 (L) 07/09/2024 0204   GLUCOSE 163 (H) 07/09/2024 0204   BUN 10 07/09/2024 0204   BUN 14 06/18/2024 1445   CREATININE 1.15 (H) 07/09/2024 0204   CREATININE 1.12 (H) 12/27/2021 0846   CALCIUM 8.7 (L) 07/09/2024 0204   PROT 6.6 07/09/2024 0204   PROT 7.2 06/18/2024 1445   ALBUMIN 3.5 07/09/2024 0204   ALBUMIN 4.3 06/18/2024 1445   AST 23 07/09/2024 0204   ALT 16 07/09/2024 0204   ALKPHOS 63 07/09/2024 0204   BILITOT 0.7 07/09/2024 0204   BILITOT 0.4 06/18/2024 1445   GFRNONAA 50 (L) 07/09/2024 0204   Lipase     Component Value Date/Time   LIPASE 34 07/06/2024 0441        Studies/Results: US  EKG SITE RITE Result Date: 07/08/2024 If Site Rite image not attached, placement could not be confirmed due to current cardiac rhythm.  DG SMALL BOWEL W DOUBLE CM (HD) Result Date: 07/07/2024 CLINICAL DATA:  Duodenal mass EXAM: SMALL BOWEL SERIES COMPARISON:  CT abdomen 07/04/2024 TECHNIQUE: Following ingestion of thin barium, serial small bowel images were obtained including spot views of the terminal ileum. FLUOROSCOPY: Radiation Exposure Index (if provided by the fluoroscopic device): 56.6 mGy air kerma FINDINGS: Initial KUB demonstrates mild dextroconvex lumbar scoliosis with rotary component and spondylosis the L3-4 and L4-5 levels. Unremarkable bowel gas pattern. Initial overhead image of the abdomen demonstrates a suspected small hiatal hernia along with a truncated barium column mid transverse duodenum. Barium was very sluggish to traverse through the 3.5 cm stenotic region of the mid transverse abdominal aorta which appears severely strictured, resulting in a mildly distended stomach and proximal duodenum compatible with mild partial obstruction. Contrast did make its way past this region, but at the 4 hour and 15 minute mark extending through most of the ileum but not yet to the cecum, slow transit attributed to the duodenal mass. The patient indicated at this point that she was ready for the  exam to be finished, and accordingly I performed compression fluoroscopy along the region of the severe transverse duodenal stricture and also throughout the rest of the visualized small bowel which encompasses nearly all of the small bowel. Aside from the region of the duodenal mass, the remaining visualized small bowel appears normal with expected caliber and fold pattern and no abnormal filling defects upon compression. IMPRESSION: 1. 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 This region has already  been biopsied and pathology is pending. The stricture causes a mild to moderate partial obstruction with slow transit; at 4 hours and 15 minutes when the patient elected to terminate further imaging, almost all but not the entirety of the small bowel was opacified. The remaining visualized small bowel appear normal. Electronically Signed   By: Ryan Salvage M.D.   On: 07/07/2024 14:08   DG Fluoro Rm 1-60 Min - No Report Result Date: 07/07/2024 Fluoroscopy was utilized by the requesting physician.  No radiographic interpretation.   DG Fluoro Rm 1-60 Min - No Report Result Date: 07/07/2024 Fluoroscopy was utilized by the requesting physician.  No radiographic interpretation.   DG Fluoro Rm 1-60 Min - No Report Result Date: 07/07/2024 Fluoroscopy was utilized by the requesting physician.  No radiographic interpretation.    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  Path with invasive adenocarcinoma -got PICC and started TNA yesterday. -spoke to patient regarding possible surgical timeline around 9/18-ish pending OR availability and surgeon schedule.  We briefly spoke about going home with her PICC/TNA until surgery date.  Patient is struggling right now with processing this all.  Daughter states they will speak about this -will have pharmacy start to work on cycling TNA so if she chooses to go home, this can be in process already -suppository prn -cea pending -will have oncology as well to go ahead and develop establishment -daughter and family friend at bedside.  Discussion had with them present as well  FEN - CLD still in place VTE - ok for chemical prophylaxis from our standpoint ID - none currently needed  PCM - see above AKI  I reviewed Consultant GI notes, hospitalist notes, last 24 h vitals and pain scores, last 48 h intake  and output, last 24 h labs and trends, and last 24 h imaging results.   LOS: 5 days    Burnard FORBES Banter , [facility] 07/09/2024, 9:36 AM Please see Amion for pager number during day hours 7:00am-4:30pm or 7:00am -11:30am on weekends

## 2024-07-09 NOTE — Progress Notes (Addendum)
 PHARMACY - TOTAL PARENTERAL NUTRITION CONSULT NOTE   Indication: Gastric outlet obstruction 2nd to mass  Patient Measurements: Height: 5' 8.5 (174 cm) Weight: 110 kg (242 lb 8.1 oz) IBW/kg (Calculated) : 65.05 TPN AdjBW (KG): 76.1 Body mass index is 36.34 kg/m. Usual Weight:   Assessment: Pharmacy consulted to manage TPN for nutrition support in [name] with gastric outlet obstruction 2nd mass.  PMH: HLD, prediabetes, CKDII does not take any medication on regular basis, past surgical h/o S/P total hysterectomy and BSO   Glucose / Insulin : hx of prediabetes, not on medication PTA CBGs 123 & 100 on TPN at 40 ml/hr & serum glucose 163 on TPN at 40 ml/hr 1 units SSI given Electrolytes:  all WNL including CoCa Renal: AKI on  CKDII vs CKD IIIa resolved. SCr 1.15, BUN WNL Hepatic: LFTs WNL 8/25 prealbumin 18 - low end of normal  Intake / Output; MIVF:  MIVF: D545NS at 40 ml/hr x 1 day ends today at 0830 am PO intake: 1080 mls 8/25 1000 ml emesis, none recorded 8/26 Urine x 4 occurrences No BM recorded. Last BM 8/20 per flowsheet GI Imaging:  8/22 CT A/P: ? SBO from mass 8/25: DG small bowel: severe stricture of transverse duodenum suspicious for mass & malignancy GI Surgeries / Procedures:   Central access: PICC placed  8/26 for TPN TPN start date: 07/08/2024  Nutritional Goals: Goal TPN rate is 80 mL/hr (provides 96  g of protein and 1843 kcals per day)  RD Assessment: Estimated Needs Total Energy Estimated Needs: 1700-1900 Total Protein Estimated Needs: 75-95 grams Total Fluid Estimated Needs: >/= 1.9 L   Current Nutrition:  Clear liquids Boost Breeze TID> all refused 8/26 TPN @ 40 ml/hr  Plan:  Increase TPN to 60 mL/hr at 1800 and f/u tolerance Goal rate of 80 ml/hr will provide 100% of needs CCS requests TPN to be changed to cyclic once at goal Electrolytes in TPN: Na 52mEq/L, K 7mEq/L, Ca 25mEq/L, Mg 34mEq/L, and Phos 15mmol/L. Cl:Ac 1:1 Add standard MVI and  trace elements to TPN continue Sensitive q6h SSI and adjust as needed  Thiamine  100 mg IV qday x 5 days MIVF per [facility], current order has expired Monitor TPN labs on Mon/Thurs, and PRN   Rosaline IVAR Edison, Pharm.D Use secure chat for questions 07/09/2024 8:32 AM

## 2024-07-10 ENCOUNTER — Inpatient Hospital Stay (HOSPITAL_COMMUNITY)

## 2024-07-10 DIAGNOSIS — K56609 Unspecified intestinal obstruction, unspecified as to partial versus complete obstruction: Secondary | ICD-10-CM | POA: Diagnosis not present

## 2024-07-10 DIAGNOSIS — C170 Malignant neoplasm of duodenum: Secondary | ICD-10-CM | POA: Diagnosis not present

## 2024-07-10 LAB — GLUCOSE, CAPILLARY
Glucose-Capillary: 146 mg/dL — ABNORMAL HIGH (ref 70–99)
Glucose-Capillary: 180 mg/dL — ABNORMAL HIGH (ref 70–99)
Glucose-Capillary: 180 mg/dL — ABNORMAL HIGH (ref 70–99)
Glucose-Capillary: 164 mg/dL — ABNORMAL HIGH (ref 70–99)

## 2024-07-10 LAB — COMPREHENSIVE METABOLIC PANEL WITH GFR
ALT: 14 U/L (ref 0–44)
AST: 16 U/L (ref 15–41)
Albumin: 3.4 g/dL — ABNORMAL LOW (ref 3.5–5.0)
Alkaline Phosphatase: 60 U/L (ref 38–126)
Anion gap: 10 (ref 5–15)
BUN: 11 mg/dL (ref 8–23)
CO2: 21 mmol/L — ABNORMAL LOW (ref 22–32)
Calcium: 8.8 mg/dL — ABNORMAL LOW (ref 8.9–10.3)
Chloride: 110 mmol/L (ref 98–111)
Creatinine, Ser: 1.1 mg/dL — ABNORMAL HIGH (ref 0.44–1.00)
GFR, Estimated: 53 mL/min — ABNORMAL LOW (ref >60)
Glucose, Bld: 178 mg/dL — ABNORMAL HIGH (ref 70–99)
Potassium: 3.9 mmol/L (ref 3.5–5.1)
Sodium: 140 mmol/L (ref 135–145)
Total Bilirubin: 0.4 mg/dL (ref 0.0–1.2)
Total Protein: 6.4 g/dL — ABNORMAL LOW (ref 6.5–8.1)

## 2024-07-10 LAB — CEA: CEA: 1.2 ng/mL (ref 0.0–4.7)

## 2024-07-10 LAB — MAGNESIUM: Magnesium: 2.2 mg/dL (ref 1.7–2.4)

## 2024-07-10 LAB — PHOSPHORUS: Phosphorus: 2.5 mg/dL (ref 2.5–4.6)

## 2024-07-10 MED ORDER — MENTHOL 3 MG MT LOZG
1.0000 | LOZENGE | OROMUCOSAL | Status: DC | PRN
  Filled 2024-07-10 (×3): qty 9

## 2024-07-10 MED ORDER — ENOXAPARIN SODIUM 40 MG/0.4ML IJ SOSY
40.0000 mg | PREFILLED_SYRINGE | Freq: Every day | INTRAMUSCULAR | Status: DC
  Administered 2024-07-10 – 2024-07-21 (×12): 40 mg via SUBCUTANEOUS
  Filled 2024-07-10 (×12): qty 0.4

## 2024-07-10 MED ORDER — PHENOL 1.4 % MT LIQD
1.0000 | OROMUCOSAL | Status: DC | PRN
  Administered 2024-07-14: 1 via OROMUCOSAL
  Filled 2024-07-10 (×2): qty 177

## 2024-07-10 MED ORDER — TRAVASOL 10 % IV SOLN
INTRAVENOUS | Status: AC
  Filled 2024-07-10: qty 960

## 2024-07-10 MED ORDER — HYDROMORPHONE HCL 1 MG/ML IJ SOLN
0.5000 mg | INTRAMUSCULAR | Status: DC | PRN
  Administered 2024-07-10 – 2024-07-23 (×3): 0.5 mg via INTRAVENOUS
  Filled 2024-07-10 (×4): qty 1
  Filled 2024-07-10: qty 0.5

## 2024-07-10 MED ORDER — ONDANSETRON HCL 4 MG/2ML IJ SOLN
4.0000 mg | INTRAMUSCULAR | Status: DC | PRN
  Administered 2024-07-10 – 2024-08-15 (×17): 4 mg via INTRAVENOUS
  Filled 2024-07-10 (×18): qty 2

## 2024-07-10 MED ORDER — POTASSIUM PHOSPHATES 15 MMOLE/5ML IV SOLN
10.0000 mmol | Freq: Once | INTRAVENOUS | Status: AC
  Administered 2024-07-10: 10 mmol via INTRAVENOUS
  Filled 2024-07-10: qty 3.33

## 2024-07-10 MED ORDER — ACETAMINOPHEN 10 MG/ML IV SOLN
1000.0000 mg | Freq: Four times a day (QID) | INTRAVENOUS | Status: AC
  Administered 2024-07-10 – 2024-07-11 (×4): 1000 mg via INTRAVENOUS
  Filled 2024-07-10 (×4): qty 100

## 2024-07-10 NOTE — Plan of Care (Signed)

## 2024-07-10 NOTE — Progress Notes (Signed)
 4 Days Post-Op  Subjective: Vomiting a lot overnight.  Over 1L.  Still nauseated this morning.    ROS: See above, otherwise other systems negative  Objective: Vital signs in last 24 hours: Temp:  [98 F (36.7 C)-99.3 F (37.4 C)] 98 F (36.7 C) (08/28 0607) Pulse Rate:  [66-68] 68 (08/28 0607) Resp:  [16-18] 18 (08/28 0607) BP: (150-162)/(55-73) 150/55 (08/28 0607) SpO2:  [97 %-98 %] 97 % (08/28 0607) Weight:  [111.6 kg] 111.6 kg (08/28 0500) Last BM Date : 07/02/24  Intake/Output from previous day: 08/27 0701 - 08/28 0700 In: 1056.2 [P.O.:400; I.V.:656.2] Out: 1000 [Emesis/NG output:1000] Intake/Output this shift: No intake/output data recorded.  PE: Gen: NAD, lying in her chair and appears to not feel well today Abd: soft, NT, +BS, mild bloating  Lab Results:  Recent Labs    07/08/24 0623 07/09/24 0204  WBC 5.1 5.4  HGB 11.7* 11.7*  HCT 36.9 37.4  PLT 245 245   BMET Recent Labs    07/09/24 0204 07/10/24 0300  NA 138 140  K 3.7 3.9  CL 106 110  CO2 20* 21*  GLUCOSE 163* 178*  BUN 10 11  CREATININE 1.15* 1.10*  CALCIUM 8.7* 8.8*   PT/INR No results for input(s): LABPROT, INR in the last 72 hours. CMP     Component Value Date/Time   NA 140 07/10/2024 0300   NA 142 06/18/2024 1445   K 3.9 07/10/2024 0300   CL 110 07/10/2024 0300   CO2 21 (L) 07/10/2024 0300   GLUCOSE 178 (H) 07/10/2024 0300   BUN 11 07/10/2024 0300   BUN 14 06/18/2024 1445   CREATININE 1.10 (H) 07/10/2024 0300   CREATININE 1.12 (H) 12/27/2021 0846   CALCIUM 8.8 (L) 07/10/2024 0300   PROT 6.4 (L) 07/10/2024 0300   PROT 7.2 06/18/2024 1445   ALBUMIN 3.4 (L) 07/10/2024 0300   ALBUMIN 4.3 06/18/2024 1445   AST 16 07/10/2024 0300   ALT 14 07/10/2024 0300   ALKPHOS 60 07/10/2024 0300   BILITOT 0.4 07/10/2024 0300   BILITOT 0.4 06/18/2024 1445   GFRNONAA 53 (L) 07/10/2024 0300   Lipase     Component Value Date/Time   LIPASE 34 07/06/2024 0441        Studies/Results: US  EKG SITE RITE Result Date: 07/08/2024 If Site Rite image not attached, placement could not be confirmed due to current cardiac rhythm.   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  Path with invasive adenocarcinoma -got PICC and started TNA. -spoke to patient regarding possible surgical timeline around 9/18-ish pending OR availability and surgeon schedule.   -pharmacy working on cycling TNA; however, patient vomiting today and not sure she can go home.  If this persists, she may require inpatient stay until surgery to manage obstructive symptoms. -suppository prn -cea 1.2 -outpatient oncology referral placed and d/w Dr. Federico yesterday -plain film, pending findings, patient may need NGT placed.  NPO -husband and sister at bedside  FEN - NPO x ice chips and few sips, may need NGT VTE - ok for chemical prophylaxis from our standpoint ID - none currently needed  PCM - see above AKI  I reviewed Consultant GI notes, hospitalist notes, last 24 h vitals and pain scores, last 48 h intake and output, last 24 h labs and trends, and last 24 h imaging  results.   LOS: 6 days    Burnard FORBES Banter , [facility] 07/10/2024, 8:32 AM Please see Amion for pager number during day hours 7:00am-4:30pm or 7:00am -11:30am on weekends

## 2024-07-10 NOTE — Progress Notes (Signed)
 [facility]  PROGRESS NOTE  Lauren Gray FMW:[id number] DOB: Feb 16, 1951 DOA: 07/04/2024 PCP: Paseda, Folashade R, FNP   Brief HPI:     73 y.o. female with medical history significant of HLD, prediabetes, CKDII does not take any medication on regular basis,  past surgical h/o S/P total hysterectomy and BSO with  presents with vomiting x 1 month, can only hold down crackers and yogurt, has not had a full bowel movement in a few weeks, last vomited this morning, last bm with small pebbles this morning, +flatus, no ab pain, + weight loss of 10lbs in the last months.  Found to have gastric outlet obstruction related to a mass.  Pathology pending.  Patient considering starting TPN, would like to discuss with her husband first.   Subjective   Patient mentioned that she vomited a lot overnight.  Denies any abdominal pain this morning.  Not passing any gas from below.  No bowel movements today although she did have a bowel movement yesterday morning.   Assessment/Plan:   Duodenal adenocarcinoma with gastric outlet obstruction CT scan shows dilated 1st and 2nd portion of duodenum with possible underlying mass. Patient was seen by gastroenterology and underwent small bowel enteroscopy.  The duodenal mass was biopsied.  Pathology is positive for adenocarcinoma. General surgery is following.  Patient has poor nutritional status.  Due to gastric outlet obstruction from the duodenal mass patient has been started on TPN.  Surgery is tentatively planned for mid September in order for her nutritional status to improve. Continue TPN for now. Patient with episodes of emesis in the last 24 hours.  May need decompression.  Will defer to general surgery.   Hypokalemia Supplemented   Essential hypertension IV hydralazine  as needed.  Blood pressure is reasonably well-controlled.  Will not be too aggressive.  Hypoglycemia Now on TPN.  CBGs have improved.  IV fluids were discontinued.   AKI on CKDII vs  CKD IIIa Appears resolved  Mild elevated lipase Resolved  DVT prophylaxis: SCDs Code Status: Full code Family Communication: family at bedside Disposition: To be determined    Medications    Chlorhexidine  Gluconate Cloth  6 each Topical Daily   feeding supplement  1 Container Oral TID BM   insulin  aspart  0-9 Units Subcutaneous Q6H   pantoprazole  (PROTONIX ) IV  40 mg Intravenous Q12H   thiamine  (VITAMIN B1) injection  100 mg Intravenous Q24H    Objective    SpO2: 97 %    Vitals:   07/09/24 1328 07/09/24 2040 07/10/24 0500 07/10/24 0607  BP: (!) 154/73 (!) 162/57  (!) 150/55  Pulse: 68 66  68  Resp: 18 16  18   Temp: 98.6 F (37 C) 99.3 F (37.4 C)  98 F (36.7 C)  TempSrc: Oral     SpO2: 98% 98%  97%  Weight:   111.6 kg   Height:        Physical Examination:  General appearance: Awake alert.  In no distress Resp: Clear to auscultation bilaterally.  Normal effort Cardio: S1-S2 is normal regular.  No S3-S4.  No rubs murmurs or bruit GI: Abdomen is soft.  Nontender.  Bowel sounds sluggish.  No masses organomegaly. No obvious focal neurological deficits.   Data Reviewed:  Basic Metabolic Panel: Recent Labs  Lab 07/04/24 1939 07/05/24 0536 07/06/24 0441 07/07/24 0509 07/08/24 0623 07/09/24 0204 07/10/24 0300  NA  --    < > 140 139 140 138 140  K  --    < >  3.1* 3.6 3.6 3.7 3.9  CL  --    < > 108 112* 111 106 110  CO2  --    < > 24 21* 19* 20* 21*  GLUCOSE  --    < > 100* 101* 69* 163* 178*  BUN  --    < > 8 9 13 10 11   CREATININE  --    < > 1.08* 1.18* 1.19* 1.15* 1.10*  CALCIUM  --    < > 8.3* 8.3* 8.4* 8.7* 8.8*  MG 2.3  --   --   --   --  2.2 2.2  PHOS  --   --   --   --   --  2.6 2.5   < > = values in this interval not displayed.    CBC: Recent Labs  Lab 07/04/24 1552 07/05/24 0536 07/07/24 0509 07/08/24 0623 07/09/24 0204  WBC 4.6 4.9 5.2 5.1 5.4  NEUTROABS 2.3  --   --   --   --   HGB 12.6 11.5* 11.5* 11.7* 11.7*  HCT 38.7 35.3*  34.9* 36.9 37.4  MCV 93.3 93.1 93.3 95.3 93.7  PLT 298 252 239 245 245    LFT Recent Labs  Lab 07/04/24 1552 07/05/24 0536 07/09/24 0204 07/10/24 0300  AST 22 20 23 16   ALT 18 15 16 14   ALKPHOS 61 56 63 60  BILITOT 0.8 0.9 0.7 0.4  PROT 7.8 6.2* 6.6 6.4*  ALBUMIN 3.6 3.0* 3.5 3.4*     [name]   [facility] If 7PM-7AM, please contact night-coverage at [email or web address], Office  [phone]   07/10/2024, 8:54 AM  LOS: 6 days

## 2024-07-10 NOTE — Progress Notes (Signed)
 PHARMACY - TOTAL PARENTERAL NUTRITION CONSULT NOTE   Indication: Gastric outlet obstruction 2nd to mass  Patient Measurements: Height: 5' 8.5 (174 cm) Weight: 111.6 kg (246 lb 0.5 oz) IBW/kg (Calculated) : 65.05 TPN AdjBW (KG): 76.1 Body mass index is 36.87 kg/m. Usual Weight:   Assessment: Pharmacy consulted to manage TPN for nutrition support in [name] with gastric outlet obstruction 2nd mass..  PMH: HLD, prediabetes, CKDII does not take any medication on regular basis, past surgical h/o S/P total hysterectomy and BSO   Glucose / Insulin : hx of prediabetes, not on medication PTA - on sSSI q6h (used 4 units since rate increased to 60 ml/hr at 6p on 8/27 (~12 hrs after rate increased) so est usage ~8 units in 24 hrs) - CBGs (goal <150): 146-178 (only one value >150) Electrolytes:  CO2 low at 21; Phos is wnl but is at the lower end of goal range;  other lytes wnl including CorrCa Renal: scr trending down with 1.10 today (crcl~61), BUN wnl Hepatic: LFTs WNL - albumin slightly low at 3.4 - 8/25 prealbumin 18 - low end of normal  Intake / Output; MIVF: not on mIVF - I/O: +56 ml - emesis/NG output: 1000 mL  - UOP: 1 x documented  - last BM: 8/20 GI Imaging:  - 8/22 CT A/P: ? SBO from mass - 8/25: DG small bowel: severe stricture of transverse duodenum suspicious for mass & malignancy GI Surgeries / Procedures:  - 8/23 small bowel enteroscopy:  LA Grade B reflux esophagitis. Small hiatal hernia. Bilious gastric fluid. Duodenal mass. Biopsied  ++ pathology showed invasive adenocarcinoma++  Central access: PICC placed  8/26 for TPN TPN start date: 07/08/2024  Nutritional Goals: Goal TPN rate is 80 mL/hr (provides 96  g of protein and 1843 kcals per day)  RD Assessment: Estimated Needs Total Energy Estimated Needs: 1700-1900 Total Protein Estimated Needs: 75-95 grams Total Fluid Estimated Needs: >/= 1.9 L   Current Nutrition:  Clear liquids  - Boost Breeze TID> all refused  8/26 - TPN   Plan:   Now - potassium phosphate  10 mmol IV x1  Today, at 1800:  - Increase TPN to goal rate of 80 mL/hr.  - CCS requests TPN to be changed to cyclic.  Will plan on transitioning to cyclic TPN once patient is on continuous TPN at goal rate for at least 1 day  without complications. - Electrolytes in TPN:  Na 50mEq/L K 50mEq/L Ca 13mEq/L Mg 74mEq/L Phos 15mmol/L Cl:Ac 1:2 - Add standard MVI and trace elements to TPN - continue Sensitive q6h SSI and adjust as needed  - Thiamine  100 mg IV qday  (8/26 - 8/31) - MIVF per [facility] - Monitor TPN labs on Mon/Thurs, and PRN - BMET, phos on 8/29  Iantha Batch, PharmD, BCPS 07/10/2024 9:08 AM

## 2024-07-11 DIAGNOSIS — C170 Malignant neoplasm of duodenum: Secondary | ICD-10-CM | POA: Diagnosis not present

## 2024-07-11 DIAGNOSIS — K56609 Unspecified intestinal obstruction, unspecified as to partial versus complete obstruction: Secondary | ICD-10-CM | POA: Diagnosis not present

## 2024-07-11 LAB — BASIC METABOLIC PANEL WITH GFR
Anion gap: 10 (ref 5–15)
BUN: 17 mg/dL (ref 8–23)
CO2: 22 mmol/L (ref 22–32)
Calcium: 9 mg/dL (ref 8.9–10.3)
Chloride: 110 mmol/L (ref 98–111)
Creatinine, Ser: 1.09 mg/dL — ABNORMAL HIGH (ref 0.44–1.00)
GFR, Estimated: 54 mL/min — ABNORMAL LOW (ref >60)
Glucose, Bld: 130 mg/dL — ABNORMAL HIGH (ref 70–99)
Potassium: 3.9 mmol/L (ref 3.5–5.1)
Sodium: 142 mmol/L (ref 135–145)

## 2024-07-11 LAB — GLUCOSE, CAPILLARY
Glucose-Capillary: 152 mg/dL — ABNORMAL HIGH (ref 70–99)
Glucose-Capillary: 153 mg/dL — ABNORMAL HIGH (ref 70–99)
Glucose-Capillary: 136 mg/dL — ABNORMAL HIGH (ref 70–99)

## 2024-07-11 LAB — PHOSPHORUS: Phosphorus: 3.3 mg/dL (ref 2.5–4.6)

## 2024-07-11 MED ORDER — TRAVASOL 10 % IV SOLN
INTRAVENOUS | Status: AC
  Filled 2024-07-11: qty 960

## 2024-07-11 NOTE — Progress Notes (Signed)
 PT Cancellation Note  Patient Details Name: Lauren Gray MRN: [id number] DOB: 05-11-1951   Cancelled Treatment:    Reason Eval/Treat Not Completed: Other (comment) Pt reports her BP is elevated from a telephone conversation that just occurred.  She requested not to participate at this time.  Pt agreeable for PT to check back in the morning.   Tari CROME [place] 07/11/2024, 4:13 PM Tari KLEIN, DPT Physical Therapist [facility] Office: [phone]

## 2024-07-11 NOTE — Progress Notes (Signed)
 5 Days Post-Op  Subjective: Feeling better with NGT in place.  In a better place today as well.  Surgery is scheduled for 9/9 at [facility].  Objective: Vital signs in last 24 hours: Temp:  [97.4 F (36.3 C)-98.1 F (36.7 C)] 97.5 F (36.4 C) (08/29 0609) Pulse Rate:  [56-66] 57 (08/29 0609) Resp:  [17-18] 18 (08/29 0609) BP: (142-169)/(50-66) 142/50 (08/29 0609) SpO2:  [93 %-97 %] 93 % (08/29 0609) Last BM Date : 07/10/24  Intake/Output from previous day: 08/28 0701 - 08/29 0700 In: 516.7 [I.V.:213.3; IV Piggyback:303.3] Out: 600 [Emesis/NG output:600] Intake/Output this shift: Total I/O In: 1260 [I.V.:960; IV Piggyback:300] Out: -   PE: Gen: NAD, looks better today Abd: soft, NT, +BS, NGT in place with bilious output  Lab Results:  Recent Labs    07/09/24 0204  WBC 5.4  HGB 11.7*  HCT 37.4  PLT 245   BMET Recent Labs    07/10/24 0300 07/11/24 0137  NA 140 142  K 3.9 3.9  CL 110 110  CO2 21* 22  GLUCOSE 178* 130*  BUN 11 17  CREATININE 1.10* 1.09*  CALCIUM 8.8* 9.0   PT/INR No results for input(s): LABPROT, INR in the last 72 hours. CMP     Component Value Date/Time   NA 142 07/11/2024 0137   NA 142 06/18/2024 1445   K 3.9 07/11/2024 0137   CL 110 07/11/2024 0137   CO2 22 07/11/2024 0137   GLUCOSE 130 (H) 07/11/2024 0137   BUN 17 07/11/2024 0137   BUN 14 06/18/2024 1445   CREATININE 1.09 (H) 07/11/2024 0137   CREATININE 1.12 (H) 12/27/2021 0846   CALCIUM 9.0 07/11/2024 0137   PROT 6.4 (L) 07/10/2024 0300   PROT 7.2 06/18/2024 1445   ALBUMIN 3.4 (L) 07/10/2024 0300   ALBUMIN 4.3 06/18/2024 1445   AST 16 07/10/2024 0300   ALT 14 07/10/2024 0300   ALKPHOS 60 07/10/2024 0300   BILITOT 0.4 07/10/2024 0300   BILITOT 0.4 06/18/2024 1445   GFRNONAA 54 (L) 07/11/2024 0137   Lipase     Component Value Date/Time   LIPASE 34 07/06/2024 0441       Studies/Results: DG Abd 1 View Result Date: 07/10/2024 CLINICAL DATA:  [id number]  Nasojejunal tube present [id number]. EXAM: ABDOMEN - 1 VIEW COMPARISON:  Radiograph from earlier the same day 8:55 a.m. FINDINGS: The bowel gas pattern is non-obstructive. There is paucity of bowel gas. Positive contrast again noted in the colon and overlying stomach. No evidence of pneumoperitoneum, within the limitations of a supine film. No acute osseous abnormalities. The soft tissues are within normal limits. Bilateral lungs and lateral costophrenic angles are clear. Normal cardiomediastinal silhouette. Surgical changes, devices, tubes and lines: Interval placement of enteric tube with its tip and side hole overlying the left upper quadrant, overlying the proximal stomach region. IMPRESSION: Interval placement of enteric tube with its tip and side hole overlying the left upper quadrant, overlying the proximal stomach region. Electronically Signed   By: Ree Molt M.D.   On: 07/10/2024 13:00   DG Abd Portable 1V Result Date: 07/10/2024 CLINICAL DATA:  [id number] Nausea AND vomiting [id number]. EXAM: PORTABLE ABDOMEN - 1 VIEW COMPARISON:  07/02/2024. FINDINGS: The bowel gas pattern is non-obstructive. There is paucity of bowel gas. Positive contrast noted in the transverse colon and ascending colon. Small amount of positive contrast also noted in the distal stomach. No evidence of pneumoperitoneum, within the limitations of a supine film. No acute  osseous abnormalities. The soft tissues are within normal limits. Surgical changes, devices, tubes and lines: None. IMPRESSION: Nonobstructive bowel gas pattern. Electronically Signed   By: Ree Molt M.D.   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  Path with invasive adenocarcinoma - PICC/TNA. -surgery appears to be scheduled for 9/9 at [facility]  We discussed that she may require her NGT  in until then.  She understands and is fine with this. -suppository prn -cea 1.2 - sister at bedside -will need to tx to [facility] for surgery, but this can be done later.  FEN - NPO x ice chips and few sips, NGT VTE - Lovenox  ID - none currently needed  PCM - see above AKI -stable  I reviewed Consultant GI notes, hospitalist notes, last 24 h vitals and pain scores, last 48 h intake and output, last 24 h labs and trends, and last 24 h imaging results.   LOS: 7 days    Burnard FORBES Banter , [facility] 07/11/2024, 9:33 AM Please see Amion for pager number during day hours 7:00am-4:30pm or 7:00am -11:30am on weekends

## 2024-07-11 NOTE — Plan of Care (Signed)
   Problem: Education: Goal: Knowledge of General Education information will improve Description: Including pain rating scale, medication(s)/side effects and non-pharmacologic comfort measures Outcome: Progressing   Problem: Nutrition: Goal: Adequate nutrition will be maintained Outcome: Progressing

## 2024-07-11 NOTE — Progress Notes (Signed)
 PHARMACY - TOTAL PARENTERAL NUTRITION CONSULT NOTE   Indication: Gastric outlet obstruction 2nd to mass   Patient Measurements: Height: 5' 8.5 (174 cm) Weight: 111.6 kg (246 lb 0.5 oz) IBW/kg (Calculated) : 65.05 TPN AdjBW (KG): 76.1 Body mass index is 36.87 kg/m. Usual Weight:   Assessment: Pharmacy consulted to manage TPN for nutrition support in [name] with gastric outlet obstruction 2nd mass..  PMH: HLD, prediabetes, CKDII does not take any medication on regular basis, past surgical h/o S/P total hysterectomy and BSO   Glucose / Insulin : hx of prediabetes, not on medication PTA - on sSSI q6h (used 4 units since rate increased to 80 ml/hr at 6p on 8/28 (~12 hrs after rate increased) so est usage ~8 units in 24 hrs) - CBGs (goal <150): 130-164 Electrolytes: all lytes wnl including CorrCa Renal: scr trending down with 1.09 today (crcl~61), BUN wnl Hepatic: LFTs WNL with labs on 8/28 - albumin slightly low at 3.4 - 8/25 prealbumin 18 - low end of normal  Intake / Output; MIVF: not on mIVF - I/O: - 83 ml - emesis output: 200 mL  - NG/OG output: 400 mL - UOP: 2 x documented  - last BM: 8/20 GI Imaging:  - 8/22 CT A/P: ? SBO from mass - 8/25: DG small bowel: severe stricture of transverse duodenum suspicious for mass & malignancy - 8/29 abd xray: Nonobstructive bowel gas pattern.  GI Surgeries / Procedures:  - 8/23 small bowel enteroscopy:  LA Grade B reflux esophagitis. Small hiatal hernia. Bilious gastric fluid. Duodenal mass. Biopsied  ++ pathology showed invasive adenocarcinoma++  Central access: PICC placed  8/26 for TPN TPN start date: 07/08/2024  Nutritional Goals: Goal TPN rate is 80 mL/hr (provides 96  g of protein and 1843 kcals per day)  RD Assessment: Estimated Needs Total Energy Estimated Needs: 1700-1900 Total Protein Estimated Needs: 75-95 grams Total Fluid Estimated Needs: >/= 1.9 L   Current Nutrition:  Clear liquids  - Boost Breeze TID> all  refused 8/26 - TPN   Plan:   Today, at 1800:  - Continue TPN at goal rate of 80 mL/hr.  - CCS requests TPN to be changed to cyclic.  Will plan on transitioning to cyclic TPN 8/30 if pt remains stable. - Electrolytes in TPN:  Na 50mEq/L K 50mEq/L Ca 5mEq/L Mg 68mEq/L Phos 15mmol/L Cl:Ac 1:2 - Add standard MVI and trace elements to TPN - continue Sensitive q6h SSI and adjust as needed  - Thiamine  100 mg IV qday  (8/26 - 8/31) - mIVF per TRH - Monitor TPN labs on Mon/Thurs, and PRN  Iantha Batch, PharmD, BCPS 07/11/2024 7:04 AM

## 2024-07-11 NOTE — TOC Progression Note (Addendum)
 Transition of Care [facility]) - Progression Note    Patient Details  Name: Lauren Gray MRN: [id number] Date of Birth: [birth year]  Transition of [facility] CM/SW Contact  Alfonse JONELLE Rex, RN Phone Number: 07/11/2024, 8:39 AM  Clinical Narrative:  per latest MD progress notes: Continue TPN for nutrition support. Consider NG tube in place. Surgery planned on 9/9 at [facility] will continue to follow.   Expected Discharge Plan: Home/Self Care Barriers to Discharge: Continued Medical Work up               Expected Discharge Plan and Services       Living arrangements for the past 2 months: Single Family Home                                       Social Drivers of Health (SDOH) Interventions SDOH Screenings   Depression (PHQ2-9): Low Risk  (06/18/2024)  Social Connections: Patient Declined (07/05/2024)  Tobacco Use: Low Risk  (07/06/2024)    Readmission Risk Interventions    07/07/2024   12:30 PM  Readmission Risk Prevention Plan  Post Dischage Appt Complete  Medication Screening Complete  Transportation Screening Complete

## 2024-07-11 NOTE — Progress Notes (Signed)
 [facility]  PROGRESS NOTE  Lauren Gray FMW:[id number] DOB: 1951-08-31 DOA: 07/04/2024 PCP: Paseda, Folashade R, FNP   Brief HPI:     73 y.o. female with medical history significant of HLD, prediabetes, CKDII does not take any medication on regular basis,  past surgical h/o S/P total hysterectomy and BSO with  presents with vomiting x 1 month, can only hold down crackers and yogurt, has not had a full bowel movement in a few weeks, last vomited this morning, last bm with small pebbles this morning, +flatus, no ab pain, + weight loss of 10lbs in the last months.  Found to have gastric outlet obstruction related to a mass.  Pathology pending.  Patient considering starting TPN, would like to discuss with her husband first.   Subjective   Patient feels better after NG tube was placed yesterday.  No further episodes of vomiting.  Stomach feels better.  Has not had a bowel movement but has passed gas from below.  Her sister is at the bedside.     Assessment/Plan:   Duodenal adenocarcinoma with gastric outlet obstruction CT scan shows dilated 1st and 2nd portion of duodenum with possible underlying mass. Patient was seen by gastroenterology and underwent small bowel enteroscopy.  The duodenal mass was biopsied.  Pathology is positive for adenocarcinoma. General surgery is following.  Patient has poor nutritional status.  Due to gastric outlet obstruction from the duodenal mass patient has been started on TPN.  Surgery is tentatively planned for mid September in order for her nutritional status to improve. Due to persistent nausea and vomiting an NG tube was inserted yesterday which seems to have helped.  N.p.o. status.   Hypokalemia Supplemented   Essential hypertension IV hydralazine  as needed.  Blood pressure is reasonably well-controlled.  Will not be too aggressive in managing this.  Hypoglycemia Now on TPN.  CBGs have improved.  IV fluids were discontinued.   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 Appears resolved  Mild elevated lipase Resolved  DVT prophylaxis: SCDs Code Status: Full code Family Communication: family at bedside Disposition: To be determined    Medications    Chlorhexidine  Gluconate Cloth  6 each Topical Daily   enoxaparin  (LOVENOX ) injection  40 mg Subcutaneous Daily   insulin  aspart  0-9 Units Subcutaneous Q6H   pantoprazole  (PROTONIX ) IV  40 mg Intravenous Q12H   thiamine  (VITAMIN B1) injection  100 mg Intravenous Q24H    Objective    SpO2: 93 %    Vitals:   07/10/24 0607 07/10/24 1354 07/10/24 2100 07/11/24 0609  BP: (!) 150/55 (!) 169/66 (!) 166/64 (!) 142/50  Pulse: 68 66 (!) 56 (!) 57  Resp: 18 18 17 18   Temp: 98 F (36.7 C) 98.1 F (36.7 C) (!) 97.4 F (36.3 C) (!) 97.5 F (36.4 C)  TempSrc:  Oral Oral Oral  SpO2: 97% 97% 95% 93%  Weight:      Height:        Physical Examination:  General appearance: Awake alert.  In no distress Resp: Clear to auscultation bilaterally.  Normal effort Cardio: S1-S2 is normal regular.  No S3-S4.  No rubs murmurs or bruit GI: Abdomen is soft.  Nontender.  Bowel sounds sluggish but present. No focal neurological deficits noted.   Data Reviewed:  Basic Metabolic Panel: Recent Labs  Lab 07/04/24 1939 07/05/24 0536 07/07/24 0509 07/08/24 9376 07/09/24 0204 07/10/24 0300 07/11/24 0137  NA  --    < > 139 140 138 140  142  K  --    < > 3.6 3.6 3.7 3.9 3.9  CL  --    < > 112* 111 106 110 110  CO2  --    < > 21* 19* 20* 21* 22  GLUCOSE  --    < > 101* 69* 163* 178* 130*  BUN  --    < > 9 13 10 11 17   CREATININE  --    < > 1.18* 1.19* 1.15* 1.10* 1.09*  CALCIUM  --    < > 8.3* 8.4* 8.7* 8.8* 9.0  MG 2.3  --   --   --  2.2 2.2  --   PHOS  --   --   --   --  2.6 2.5 3.3   < > = values in this interval not displayed.    CBC: Recent Labs  Lab 07/04/24 1552 07/05/24 0536 07/07/24 0509 07/08/24 0623 07/09/24 0204  WBC 4.6 4.9 5.2 5.1 5.4  NEUTROABS 2.3  --   --   --   --   HGB  12.6 11.5* 11.5* 11.7* 11.7*  HCT 38.7 35.3* 34.9* 36.9 37.4  MCV 93.3 93.1 93.3 95.3 93.7  PLT 298 252 239 245 245    LFT Recent Labs  Lab 07/04/24 1552 07/05/24 0536 07/09/24 0204 07/10/24 0300  AST 22 20 23 16   ALT 18 15 16 14   ALKPHOS 61 56 63 60  BILITOT 0.8 0.9 0.7 0.4  PROT 7.8 6.2* 6.6 6.4*  ALBUMIN 3.6 3.0* 3.5 3.4*     Jaymeson Mengel   [facility] If 7PM-7AM, please contact night-coverage at [email or web address], Office  [phone]   07/11/2024, 9:06 AM  LOS: 7 days

## 2024-07-11 NOTE — Plan of Care (Signed)
   Problem: Education: Goal: Knowledge of General Education information will improve Description Including pain rating scale, medication(s)/side effects and non-pharmacologic comfort measures Outcome: Progressing

## 2024-07-11 NOTE — Progress Notes (Signed)
 Nutrition Follow-up  DOCUMENTATION CODES:   Obesity unspecified  INTERVENTION:   Monitor magnesium, potassium, and phosphorus daily for at least 3 days, MD to replete as needed, as pt is at risk for refeeding syndrome.  -Continue Thiamine  100 mg daily for at least 5 days    -TPN management per Pharmacy -Daily weights while on TPN  NUTRITION DIAGNOSIS:   Inadequate oral intake related to nausea, vomiting (GOO r/t duodenal mass) as evidenced by per patient/family report.  Ongoing.  GOAL:   Patient will meet greater than or equal to 90% of their needs  Meeting with TPN  MONITOR:    (TPN)  ASSESSMENT:   73 y.o. female with medical history significant of HLD, prediabetes, CKDII does not take any medication on regular basis,  past surgical h/o S/P total hysterectomy and BSO with  presents with vomiting x 1 month, can only hold down crackers and yogurt, has not had a full bowel movement in a few weeks, last vomited this morning, last bm with small pebbles this morning, +flatus, no ab pain, + weight loss of 10lbs in the last months.  Found to have gastric outlet obstruction related to a mass.  8/22: admitted, NPO 8/23: FLD, s/p SB enteroscopy : reflux esophagitis, small hiatal hernia, duodenal mass, biopsy 8/24: CLD 8/25: CLD 8/26: TPN initiated via PICC 8/28: NPO, NGT placed to suction  Patient now NPO, with NGT placed d/t continued emesis. Output: 400 ml yesterday. No output today.   TPN  to continue at 80 ml/hr, providing 1843 kcals and 96g protein. Plan is to transition to cyclic 8/30. Pt expected to be on TPN while awaiting whipple at [facility] on 9/9.  Admission weight: 241 lbs Current weight: 246 lbs I/Os: +4.6L since admit  Medications: Thiamine   Labs reviewed: CBGs: 152-180   Diet Order:   Diet Order             Diet NPO time specified Except for: Ice Chips, Other (See Comments)  Diet effective now                   EDUCATION NEEDS:   Education needs  have been addressed  Skin:  Skin Assessment: Reviewed RN Assessment  Last BM:  8/28  Height:   Ht Readings from Last 1 Encounters:  07/05/24 5' 8.5 (1.74 m)    Weight:   Wt Readings from Last 1 Encounters:  07/10/24 111.6 kg    BMI:  Body mass index is 36.87 kg/m.  Estimated Nutritional Needs:   Kcal:  1700-1900  Protein:  75-95g  Fluid:  1.9L/day  Morna Lee, MS, RD, LDN Inpatient Clinical Dietitian Contact via Secure chat

## 2024-07-12 DIAGNOSIS — K311 Adult hypertrophic pyloric stenosis: Secondary | ICD-10-CM | POA: Diagnosis not present

## 2024-07-12 DIAGNOSIS — C170 Malignant neoplasm of duodenum: Secondary | ICD-10-CM | POA: Diagnosis not present

## 2024-07-12 LAB — BASIC METABOLIC PANEL WITH GFR
Anion gap: 11 (ref 5–15)
BUN: 19 mg/dL (ref 8–23)
CO2: 20 mmol/L — ABNORMAL LOW (ref 22–32)
Calcium: 9.1 mg/dL (ref 8.9–10.3)
Chloride: 109 mmol/L (ref 98–111)
Creatinine, Ser: 0.92 mg/dL (ref 0.44–1.00)
GFR, Estimated: >60 mL/min (ref >60)
Glucose, Bld: 153 mg/dL — ABNORMAL HIGH (ref 70–99)
Potassium: 4 mmol/L (ref 3.5–5.1)
Sodium: 140 mmol/L (ref 135–145)

## 2024-07-12 LAB — GLUCOSE, CAPILLARY
Glucose-Capillary: 142 mg/dL — ABNORMAL HIGH (ref 70–99)
Glucose-Capillary: 147 mg/dL — ABNORMAL HIGH (ref 70–99)
Glucose-Capillary: 160 mg/dL — ABNORMAL HIGH (ref 70–99)
Glucose-Capillary: 161 mg/dL — ABNORMAL HIGH (ref 70–99)
Glucose-Capillary: 176 mg/dL — ABNORMAL HIGH (ref 70–99)

## 2024-07-12 MED ORDER — INSULIN ASPART 100 UNIT/ML IJ SOLN
0.0000 [IU] | INTRAMUSCULAR | Status: DC
  Administered 2024-07-12 (×3): 2 [IU] via SUBCUTANEOUS
  Administered 2024-07-13: 3 [IU] via SUBCUTANEOUS

## 2024-07-12 MED ORDER — TRAVASOL 10 % IV SOLN
INTRAVENOUS | Status: AC
  Filled 2024-07-12: qty 950

## 2024-07-12 MED ORDER — LIDOCAINE VISCOUS HCL 2 % MT SOLN
15.0000 mL | OROMUCOSAL | Status: DC | PRN
  Filled 2024-07-12: qty 15

## 2024-07-12 NOTE — Progress Notes (Signed)
 PT Cancellation Note  Patient Details Name: Lauren Gray MRN: [id number] DOB: 1950/12/12   Cancelled Treatment:    Reason Eval/Treat Not Completed: Fatigue/lethargy limiting ability to participate;Pain limiting ability to participate   [facility] 07/12/2024, 2:07 PM

## 2024-07-12 NOTE — Progress Notes (Signed)
 [facility]  PROGRESS NOTE  Lauren Gray FMW:[id number] DOB: 1950/11/16 DOA: 07/04/2024 PCP: Paseda, Folashade R, FNP   Brief HPI:     73 y.o. female with medical history significant of HLD, prediabetes, CKDII does not take any medication on regular basis,  past surgical h/o S/P total hysterectomy and BSO with  presents with vomiting x 1 month, can only hold down crackers and yogurt, has not had a full bowel movement in a few weeks, last vomited this morning, last bm with small pebbles this morning, +flatus, no ab pain, + weight loss of 10lbs in the last months.  Found to have gastric outlet obstruction related to a mass.  Pathology pending.  Patient considering starting TPN, would like to discuss with her husband first.   Subjective   Complains of throat pain probably from the NG tube.  Denies any abdominal pain nausea or vomiting.      Assessment/Plan:   Duodenal adenocarcinoma with gastric outlet obstruction CT scan shows dilated 1st and 2nd portion of duodenum with possible underlying mass. Patient was seen by gastroenterology and underwent small bowel enteroscopy.  The duodenal mass was biopsied.  Pathology is positive for adenocarcinoma. General surgery is following.  Patient has poor nutritional status.  Due to gastric outlet obstruction from the duodenal mass patient has been started on TPN.   Surgery is tentatively planned for mid September in order for her nutritional status to improve. Due to persistent nausea and vomiting an NG tube was inserted.  Stable for the most part.  Chloraseptic spray and viscous lidocaine  can be used for mouth/throat discomfort.    Hypokalemia Labs are pending from today.   Essential hypertension IV hydralazine  as needed.  Blood pressure is reasonably well-controlled.  Will not be too aggressive in managing this.  Hypoglycemia Now on TPN.  CBGs have improved.  IV fluids were discontinued.   AKI on CKDII vs CKD IIIa Appears resolved  Mild  elevated lipase Resolved  DVT prophylaxis: SCDs Code Status: Full code Family Communication: family at bedside Disposition: Plan is for surgery mid-September which will be done at [facility].  Disposition to be determined subsequently.    Medications    Chlorhexidine  Gluconate Cloth  6 each Topical Daily   enoxaparin  (LOVENOX ) injection  40 mg Subcutaneous Daily   insulin  aspart  0-9 Units Subcutaneous Q6H   pantoprazole  (PROTONIX ) IV  40 mg Intravenous Q12H   thiamine  (VITAMIN B1) injection  100 mg Intravenous Q24H    Objective    SpO2: 100 %    Vitals:   07/11/24 1020 07/11/24 2238 07/12/24 0425 07/12/24 0458  BP: (!) 141/43 (!) 190/71 (!) 168/46   Pulse: 60 77 77   Resp: 16 18 16    Temp: 97.9 F (36.6 C) 98 F (36.7 C) 98.8 F (37.1 C)   TempSrc:   Oral   SpO2: 100% 97% 100%   Weight:    111.7 kg  Height:        Physical Examination:  General appearance: Awake alert.  In no distress No oral lesions noted on examination. Resp: Clear to auscultation bilaterally.  Normal effort Cardio: S1-S2 is normal regular.  No S3-S4.  No rubs murmurs or bruit GI: Abdomen is soft.  Nontender nondistended.  Bowel sounds are present normal.  No masses organomegaly    Data Reviewed:  Basic Metabolic Panel: Recent Labs  Lab 07/07/24 0509 07/08/24 0623 07/09/24 0204 07/10/24 0300 07/11/24 0137  NA 139 140 138 140 142  K 3.6 3.6 3.7 3.9 3.9  CL 112* 111 106 110 110  CO2 21* 19* 20* 21* 22  GLUCOSE 101* 69* 163* 178* 130*  BUN 9 13 10 11 17   CREATININE 1.18* 1.19* 1.15* 1.10* 1.09*  CALCIUM 8.3* 8.4* 8.7* 8.8* 9.0  MG  --   --  2.2 2.2  --   PHOS  --   --  2.6 2.5 3.3    CBC: Recent Labs  Lab 07/07/24 0509 07/08/24 0623 07/09/24 0204  WBC 5.2 5.1 5.4  HGB 11.5* 11.7* 11.7*  HCT 34.9* 36.9 37.4  MCV 93.3 95.3 93.7  PLT 239 245 245    LFT Recent Labs  Lab 07/09/24 0204 07/10/24 0300  AST 23 16  ALT 16 14  ALKPHOS 63 60  BILITOT 0.7 0.4  PROT 6.6  6.4*  ALBUMIN 3.5 3.4*     Svea Pusch   [facility] If 7PM-7AM, please contact night-coverage at [email or web address], Office  [phone]   07/12/2024, 9:39 AM  LOS: 8 days

## 2024-07-12 NOTE — Evaluation (Signed)
 Occupational Therapy Evaluation Patient Details Name: Lauren Gray MRN: [id number] DOB: 1951/03/14 Today's Date: 07/12/2024   History of Present Illness   Pt is a 74 y/o female presenting with 1 month of nausea/vomiting. CT abdomen showed SBO from underlying mass. Mass biopsy showed + invasive adenocarcinoma. Requiring TPN. Surgery pending for Sept 9th. PMH: hypokalemia, HTN, CKD, prediabetes     Clinical Impressions PTA, pt lives with spouse, typically completely independent without need for AD and works in the school system. Pt presents now with deficits in activity tolerance, abdominal discomfort and strength. Pt received in chair (had been sitting up for approx 1 hour) but reported having no sleep overnight and opted for transfer back to bed. No more than CGA needed for transfer without AD and no more than Min A needed for ADL mgmt based on current functional abilities. Family at bedside and supportive. Anticipate no OT needs upon DC pending continued acute improvements. Will continue to follow while admitted.     If plan is discharge home, recommend the following:   A little help with bathing/dressing/bathroom;Assistance with cooking/housework;Assist for transportation;Help with stairs or ramp for entrance     Functional Status Assessment   Patient has had a recent decline in their functional status and demonstrates the ability to make significant improvements in function in a reasonable and predictable amount of time.     Equipment Recommendations   Other (comment) (TBD pending progress; can borrow a shower chair from family)     Recommendations for Other Services         Precautions/Restrictions   Precautions Precautions: Fall;Other (comment) Precaution/Restrictions Comments: NGT Restrictions Weight Bearing Restrictions Per Provider Order: No     Mobility Bed Mobility Overal bed mobility: Needs Assistance Bed Mobility: Sit to Supine       Sit to supine:  Min assist   General bed mobility comments: Min A for BLE back to bed    Transfers Overall transfer level: Needs assistance Equipment used: None Transfers: Sit to/from Stand, Bed to chair/wheelchair/BSC Sit to Stand: Contact guard assist     Step pivot transfers: Contact guard assist     General transfer comment: handheld assist for comfort/confidence. pt able to step to bedside from recliner without AD      Balance Overall balance assessment: Needs assistance Sitting-balance support: No upper extremity supported, Feet supported Sitting balance-Leahy Scale: Fair     Standing balance support: No upper extremity supported, During functional activity Standing balance-Leahy Scale: Fair                             ADL either performed or assessed with clinical judgement   ADL Overall ADL's : Needs assistance/impaired Eating/Feeding: NPO   Grooming: Set up;Sitting   Upper Body Bathing: Minimal assistance;Sitting   Lower Body Bathing: Minimal assistance;Sit to/from stand;Sitting/lateral leans   Upper Body Dressing : Minimal assistance   Lower Body Dressing: Minimal assistance;Sitting/lateral leans;Sit to/from stand   Toilet Transfer: Contact guard assist;Stand-pivot;BSC/3in1   Toileting- Clothing Manipulation and Hygiene: Contact guard assist;Sit to/from stand;Sitting/lateral lean         General ADL Comments: Discussed energy conservation, potential helpful DME at home and gradual building of activity tolerance.     Vision Ability to See in Adequate Light: 0 Adequate Patient Visual Report: No change from baseline Vision Assessment?: No apparent visual deficits     Perception         Praxis  Pertinent Vitals/Pain Pain Assessment Pain Assessment: Faces Faces Pain Scale: Hurts little more Pain Location: abdomen Pain Descriptors / Indicators: Guarding, Grimacing Pain Intervention(s): Monitored during session, Premedicated before session,  Repositioned     Extremity/Trunk Assessment Upper Extremity Assessment Upper Extremity Assessment: Overall WFL for tasks assessed;Right hand dominant   Lower Extremity Assessment Lower Extremity Assessment: Defer to PT evaluation   Cervical / Trunk Assessment Cervical / Trunk Assessment: Normal   Communication Communication Communication: No apparent difficulties   Cognition Arousal: Alert Behavior During Therapy: WFL for tasks assessed/performed, Flat affect Cognition: No apparent impairments             OT - Cognition Comments: tearful regarding diagnosis, very pleasant                 Following commands: Intact       Cueing  General Comments   Cueing Techniques: Verbal cues  Sister and husband at bedside, supportive and hands on to assist   Exercises     Shoulder Instructions      Home Living Family/patient expects to be discharged to:: Private residence Living Arrangements: Spouse/significant other Available Help at Discharge: Family Type of Home: House Home Access: Level entry     Home Layout: Two level (split level) Alternate Level Stairs-Number of Steps: 5 + 6 to get to main level Alternate Level Stairs-Rails: Left;Right Bathroom Shower/Tub: Chief Strategy Officer: Standard     Home Equipment: None;Other (comment) (but has access to DME such as shower chair from family if needed)          Prior Functioning/Environment Prior Level of Function : Independent/Modified Independent;Working/employed;Driving             Mobility Comments: no AD ADLs Comments: Indep with ADLs, IADLs, works in teaching    OT Problem List: Decreased strength;Decreased activity tolerance;Impaired balance (sitting and/or standing);Decreased knowledge of use of DME or AE;Pain   OT Treatment/Interventions: Self-care/ADL training;Therapeutic exercise;Energy conservation;DME and/or AE instruction;Therapeutic activities;Patient/family  education;Balance training      OT Goals(Current goals can be found in the care plan section)   Acute Rehab OT Goals Patient Stated Goal: get some sleep, recover OT Goal Formulation: With patient/family Time For Goal Achievement: 07/26/24 Potential to Achieve Goals: Good ADL Goals Pt Will Perform Lower Body Bathing: with modified independence;sitting/lateral leans;sit to/from stand Pt Will Perform Lower Body Dressing: with modified independence;sitting/lateral leans;sit to/from stand Pt Will Transfer to Toilet: with supervision;ambulating Additional ADL Goal #1: Pt to verbalize at least 3 energy conservation strategies to implement at home   OT Frequency:  Min 2X/week    Co-evaluation              AM-PAC OT 6 Clicks Daily Activity     Outcome Measure Help from another person eating meals?: Total (NPO) Help from another person taking care of personal grooming?: A Little Help from another person toileting, which includes using toliet, bedpan, or urinal?: A Little Help from another person bathing (including washing, rinsing, drying)?: A Little Help from another person to put on and taking off regular upper body clothing?: A Little Help from another person to put on and taking off regular lower body clothing?: A Little 6 Click Score: 16   End of Session Nurse Communication: Mobility status  Activity Tolerance: Patient limited by fatigue Patient left: in bed;with call bell/phone within reach;with bed alarm set;with family/visitor present  OT Visit Diagnosis: Other abnormalities of gait and mobility (R26.89);Muscle weakness (generalized) (M62.81)  Time: 9058-9044 OT Time Calculation (min): 14 min Charges:  OT General Charges $OT Visit: 1 Visit OT Evaluation $OT Eval Moderate Complexity: 1 Mod  Lauren NOVAK, OTR/L Acute Rehab Services Office: [phone]   Lauren Gray 07/12/2024, 11:18 AM

## 2024-07-12 NOTE — Progress Notes (Signed)
 6 Days Post-Op   Subjective/Chief Complaint: Ng in, feels fine   Objective: Vital signs in last 24 hours: Temp:  [97.9 F (36.6 C)-98.8 F (37.1 C)] 98.8 F (37.1 C) (08/30 0425) Pulse Rate:  [60-77] 77 (08/30 0425) Resp:  [16-18] 16 (08/30 0425) BP: (141-190)/(43-71) 168/46 (08/30 0425) SpO2:  [97 %-100 %] 100 % (08/30 0425) Weight:  [111.7 kg] 111.7 kg (08/30 0458) Last BM Date : 07/10/24  Intake/Output from previous day: 08/29 0701 - 08/30 0700 In: 2235.8 [P.O.:30; I.V.:1905.8; IV Piggyback:300] Out: 1650 [Emesis/NG output:1650] Intake/Output this shift: No intake/output data recorded.   Lab Results:  No results for input(s): WBC, HGB, HCT, PLT in the last 72 hours. BMET Recent Labs    07/10/24 0300 07/11/24 0137  NA 140 142  K 3.9 3.9  CL 110 110  CO2 21* 22  GLUCOSE 178* 130*  BUN 11 17  CREATININE 1.10* 1.09*  CALCIUM 8.8* 9.0   PT/INR No results for input(s): LABPROT, INR in the last 72 hours. ABG No results for input(s): PHART, HCO3 in the last 72 hours.  Invalid input(s): PCO2, PO2  Studies/Results: DG Abd 1 View Result Date: 07/10/2024 CLINICAL DATA:  [id number] Nasojejunal tube present [id number]. EXAM: ABDOMEN - 1 VIEW COMPARISON:  Radiograph from earlier the same day 8:55 a.m. FINDINGS: The bowel gas pattern is non-obstructive. There is paucity of bowel gas. Positive contrast again noted in the colon and overlying stomach. No evidence of pneumoperitoneum, within the limitations of a supine film. No acute osseous abnormalities. The soft tissues are within normal limits. Bilateral lungs and lateral costophrenic angles are clear. Normal cardiomediastinal silhouette. Surgical changes, devices, tubes and lines: Interval placement of enteric tube with its tip and side hole overlying the left upper quadrant, overlying the proximal stomach region. IMPRESSION: Interval placement of enteric tube with its tip and side hole overlying the left upper  quadrant, overlying the proximal stomach region. Electronically Signed   By: Ree Molt M.D.   On: 07/10/2024 13:00   DG Abd Portable 1V Result Date: 07/10/2024 CLINICAL DATA:  [id number] Nausea AND vomiting [id number]. EXAM: PORTABLE ABDOMEN - 1 VIEW COMPARISON:  07/02/2024. FINDINGS: The bowel gas pattern is non-obstructive. There is paucity of bowel gas. Positive contrast noted in the transverse colon and ascending colon. Small amount of positive contrast also noted in the distal stomach. No evidence of pneumoperitoneum, within the limitations of a supine film. No acute osseous abnormalities. The soft tissues are within normal limits. Surgical changes, devices, tubes and lines: None. IMPRESSION: Nonobstructive bowel gas pattern. Electronically Signed   By: Ree Molt M.D.   On: 07/10/2024 09:10    Anti-infectives: Anti-infectives (From admission, onward)    None       Assessment/Plan: Gastric outlet obstruction secondary to mass in D3, invasive adenocarcinoma -s/p EGD with bx 8/24.  Path with invasive adenocarcinoma - PICC/TPN -surgery appears to be scheduled for 9/9 at [facility].  We discussed that she may require her NGT in until then.  - sister at bedside -will need to tx to [facility] for surgery, but this can be done later.   FEN - NPO x ice chips and few sips, NGT VTE - Lovenox  ID - none currently needed   Will see again Monday, please call if anything needed before then  Donnice Bury 07/12/2024

## 2024-07-12 NOTE — Progress Notes (Addendum)
 PHARMACY - TOTAL PARENTERAL NUTRITION CONSULT NOTE   Indication: Gastric outlet obstruction 2nd to mass   Patient Measurements: Height: 5' 8.5 (174 cm) Weight: 111.7 kg (246 lb 4.1 oz) IBW/kg (Calculated) : 65.05 TPN AdjBW (KG): 76.1 Body mass index is 36.9 kg/m. Usual Weight:   Assessment: Pharmacy consulted to manage TPN for nutrition support in [name] with gastric outlet obstruction 2nd mass..  PMH: HLD, prediabetes, CKDII does not take any medication on regular basis, past surgical h/o S/P total hysterectomy and BSO   Glucose / Insulin : hx of prediabetes, not on medication PTA - on sSSI q6h (used 3 units insulin  aspart in previous 24 hr) - CBGs (goal <150): 130-161 Electrolytes: 8/29 all lytes wnl including CorrCa Renal: 8/29 scr trending down with 1.09 today (crcl~61), BUN wnl Hepatic: LFTs WNL with labs on 8/28 - albumin slightly low at 3.4 - 8/25 prealbumin 18 - low end of normal  Intake / Output; MIVF: not on mIVF - I/O: 585.8 ml - emesis output: 0 mL  - NG/OG output: 1650 mL - UOP: 2 x documented  - last BM: 8/30 GI Imaging:  - 8/22 CT A/P: ? SBO from mass - 8/25: DG small bowel: severe stricture of transverse duodenum suspicious for mass & malignancy - 8/29 abd xray: Nonobstructive bowel gas pattern.  GI Surgeries / Procedures:  - 8/23 small bowel enteroscopy:  LA Grade B reflux esophagitis. Small hiatal hernia. Bilious gastric fluid. Duodenal mass. Biopsied  ++ pathology showed invasive adenocarcinoma++  Central access: PICC placed  8/26 for TPN TPN start date: 07/08/2024  Nutritional Goals: Goal TPN rate is 80 mL/hr (provides 96  g of protein and 1843 kcals per day) 8/30 Cyclic TPN: 1900 ml over 18 hours provides 95 g of protein and 1858 kcals per day  RD Assessment: Estimated Needs Total Energy Estimated Needs: 1700-1900 Total Protein Estimated Needs: 75-95 grams Total Fluid Estimated Needs: >/= 1.9 L   Current Nutrition:  Clear liquids  - Boost  Breeze TID> all refused 8/26 - TPN   Plan:  Today, at 1800:  Initiate Cyclic TPN: 1900 ml over 18 hours (Start at 56 ml/hr for 1 hour, then increase rate to 112 ml/hr for 16 hours, then decrease rate to 56 ml/hr for 1 hour, then stop) Electrolytes in TPN:  Na 50mEq/L K 50mEq/L Ca 5mEq/L Mg 28mEq/L Phos 15mmol/L Cl:Ac 1:2 Add standard MVI and trace elements to TPN Sensitive scale SSI with current CBG schedule as follows: Prior to starting TPN 2 hrs after TPN start While on TPN 1 hr after TPN stopped Thiamine  100 mg IV qday  (8/26 - 8/31) mIVF per TRH Monitor TPN labs on Mon/Thurs, and PRN;  BMET, magnesium, and phosphorus tomorrow morning   Thank you for allowing pharmacy to be a part of this patient's care.  Eleanor EMERSON Agent, PharmD, BCPS Clinical Pharmacist [place] 07/12/2024 11:14 AM

## 2024-07-13 DIAGNOSIS — I10 Essential (primary) hypertension: Secondary | ICD-10-CM

## 2024-07-13 DIAGNOSIS — K311 Adult hypertrophic pyloric stenosis: Secondary | ICD-10-CM | POA: Diagnosis not present

## 2024-07-13 DIAGNOSIS — C170 Malignant neoplasm of duodenum: Secondary | ICD-10-CM | POA: Diagnosis not present

## 2024-07-13 LAB — PHOSPHORUS: Phosphorus: 2.4 mg/dL — ABNORMAL LOW (ref 2.5–4.6)

## 2024-07-13 LAB — GLUCOSE, CAPILLARY
Glucose-Capillary: 232 mg/dL — ABNORMAL HIGH (ref 70–99)
Glucose-Capillary: 243 mg/dL — ABNORMAL HIGH (ref 70–99)
Glucose-Capillary: 107 mg/dL — ABNORMAL HIGH (ref 70–99)
Glucose-Capillary: 323 mg/dL — ABNORMAL HIGH (ref 70–99)

## 2024-07-13 LAB — BASIC METABOLIC PANEL WITH GFR
Anion gap: 11 (ref 5–15)
BUN: 23 mg/dL (ref 8–23)
CO2: 20 mmol/L — ABNORMAL LOW (ref 22–32)
Calcium: 9.1 mg/dL (ref 8.9–10.3)
Chloride: 109 mmol/L (ref 98–111)
Creatinine, Ser: 0.94 mg/dL (ref 0.44–1.00)
GFR, Estimated: >60 mL/min (ref >60)
Glucose, Bld: 240 mg/dL — ABNORMAL HIGH (ref 70–99)
Potassium: 4.2 mmol/L (ref 3.5–5.1)
Sodium: 141 mmol/L (ref 135–145)

## 2024-07-13 LAB — MAGNESIUM: Magnesium: 2.3 mg/dL (ref 1.7–2.4)

## 2024-07-13 MED ORDER — METOPROLOL TARTRATE 5 MG/5ML IV SOLN
2.5000 mg | Freq: Four times a day (QID) | INTRAVENOUS | Status: DC
  Administered 2024-07-13 – 2024-07-14 (×4): 2.5 mg via INTRAVENOUS
  Filled 2024-07-13 (×4): qty 5

## 2024-07-13 MED ORDER — TRAVASOL 10 % IV SOLN
INTRAVENOUS | Status: AC
  Filled 2024-07-13: qty 950

## 2024-07-13 MED ORDER — INSULIN ASPART 100 UNIT/ML IJ SOLN
0.0000 [IU] | INTRAMUSCULAR | Status: DC
  Administered 2024-07-13: 3 [IU] via SUBCUTANEOUS
  Administered 2024-07-13 – 2024-07-14 (×2): 7 [IU] via SUBCUTANEOUS
  Administered 2024-07-14 (×2): 5 [IU] via SUBCUTANEOUS

## 2024-07-13 NOTE — Plan of Care (Signed)
   Problem: Education: Goal: Knowledge of General Education information will improve Description Including pain rating scale, medication(s)/side effects and non-pharmacologic comfort measures Outcome: Progressing

## 2024-07-13 NOTE — Plan of Care (Signed)
   Problem: Education: Goal: Knowledge of General Education information will improve Description: Including pain rating scale, medication(s)/side effects and non-pharmacologic comfort measures Outcome: Progressing   Problem: Activity: Goal: Risk for activity intolerance will decrease Outcome: Progressing

## 2024-07-13 NOTE — Progress Notes (Signed)
 This patient is hypertensive tonight @0054 , BP 193-55, HR 96, returned from bathroom at this time feeling lightheaded and dizzy. BP was rechecked @ 0108 and decreased to 169/70 HR 95. She received 10 mg Hydralazine  @ 2238. Feels better at this time and resting.

## 2024-07-13 NOTE — Evaluation (Signed)
 Physical Therapy Evaluation Patient Details Name: Lauren Gray MRN: [id number] DOB: Nov 13, 1951 Today's Date: 07/13/2024  History of Present Illness  Pt is a 73 y/o female presenting with 1 month of nausea/vomiting. CT abdomen showed SBO from underlying mass. Mass biopsy showed + invasive adenocarcinoma. Requiring TPN. Surgery pending for Sept 9th. PMH: hypokalemia, HTN, CKD, prediabetes  Clinical Impression  Patient evaluated by Physical Therapy with no further acute PT needs identified. All education has been completed and the patient has no further questions.  Pt doing well with mobility today. Denies pain. Amb~ 160' with supervision for lines and safety. Pt will benefit from continuing to mobilize with family/mobility team. No current PT needs, pt may need re-eval after surgery--discussed with pt and her family.   See below for any follow-up Physical Therapy or equipment needs. PT is signing off. Thank you for this referral.         If plan is discharge home, recommend the following: Assist for transportation   Can travel by private vehicle        Equipment Recommendations None recommended by PT  Recommendations for Other Services       Functional Status Assessment Patient has not had a recent decline in their functional status     Precautions / Restrictions Precautions Precautions: Fall;Other (comment) Precaution/Restrictions Comments: NGT Restrictions Weight Bearing Restrictions Per Provider Order: No      Mobility  Bed Mobility Overal bed mobility: Needs Assistance Bed Mobility: Supine to Sit     Supine to sit: Modified independent (Device/Increase time), Supervision     General bed mobility comments: no physical assist, sueprvision for lines    Transfers Overall transfer level: Needs assistance Equipment used: None Transfers: Sit to/from Stand Sit to Stand: Supervision, Contact guard assist           General transfer comment: for safety and line  management    Ambulation/Gait Ambulation/Gait assistance: Contact guard assist, Supervision Gait Distance (Feet): 160 Feet Assistive device: None Gait Pattern/deviations: Step-through pattern       General Gait Details: occasional rail touch, slightly unsteady however initially however no overt LOB  Stairs            Wheelchair Mobility     Tilt Bed    Modified Rankin (Stroke Patients Only)       Balance Overall balance assessment: Needs assistance Sitting-balance support: No upper extremity supported, Feet supported Sitting balance-Leahy Scale: Good     Standing balance support: No upper extremity supported, During functional activity Standing balance-Leahy Scale: Fair Standing balance comment: Fair +/NT to mod challenges                             Pertinent Vitals/Pain Pain Assessment Pain Assessment: No/denies pain Faces Pain Scale: Hurts little more Pain Location: abdomen Pain Descriptors / Indicators: Guarding, Grimacing, Sore, Aching Pain Intervention(s): Limited activity within patient's tolerance, Monitored during session, Premedicated before session    Home Living Family/patient expects to be discharged to:: Private residence Living Arrangements: Spouse/significant other Available Help at Discharge: Family Type of Home: House Home Access: Level entry     Alternate Level Stairs-Number of Steps: 5 + 6 to get to main level Home Layout: Multi-level (split level) Home Equipment: Rolling Walker (2 wheels);Cane - single point;Hand held shower head;Grab bars - tub/shower      Prior Function Prior Level of Function : Independent/Modified Independent;Working/employed;Driving  Mobility Comments: no AD ADLs Comments: Indep with ADLs, IADLs, works in teaching     Extremity/Trunk Assessment   Upper Extremity Assessment Upper Extremity Assessment: Overall WFL for tasks assessed    Lower Extremity Assessment Lower  Extremity Assessment: Overall WFL for tasks assessed       Communication   Communication Communication: No apparent difficulties    Cognition Arousal: Alert Behavior During Therapy: WFL for tasks assessed/performed   PT - Cognitive impairments: No apparent impairments                         Following commands: Intact       Cueing Cueing Techniques: Verbal cues     General Comments      Exercises     Assessment/Plan    PT Assessment Patient does not need any further PT services  PT Problem List         PT Treatment Interventions      PT Goals (Current goals can be found in the Care Plan section)  Acute Rehab PT Goals Patient Stated Goal: surgery and then back to being ind PT Goal Formulation: All assessment and education complete, DC therapy    Frequency       Co-evaluation               AM-PAC PT 6 Clicks Mobility  Outcome Measure Help needed turning from your back to your side while in a flat bed without using bedrails?: None Help needed moving from lying on your back to sitting on the side of a flat bed without using bedrails?: None Help needed moving to and from a bed to a chair (including a wheelchair)?: None Help needed standing up from a chair using your arms (e.g., wheelchair or bedside chair)?: None Help needed to walk in hospital room?: None Help needed climbing 3-5 steps with a railing? : None 6 Click Score: 24    End of Session Equipment Utilized During Treatment: Gait belt Activity Tolerance: Patient tolerated treatment well Patient left: in bed;with call bell/phone within reach;with family/visitor present Nurse Communication: Mobility status PT Visit Diagnosis: Other abnormalities of gait and mobility (R26.89)    Time: 8970-8953 PT Time Calculation (min) (ACUTE ONLY): 17 min   Charges:   PT Evaluation $PT Eval Low Complexity: 1 Low   PT General Charges $$ ACUTE PT VISIT: 1 Visit         Ashish Rossetti, PT  Acute  Rehab Dept ([facility]) [phone]  07/13/2024   [facility] [place] 07/13/2024, 2:05 PM

## 2024-07-13 NOTE — Progress Notes (Addendum)
 PHARMACY - TOTAL PARENTERAL NUTRITION CONSULT NOTE   Indication: Gastric outlet obstruction 2nd to mass   Patient Measurements: Height: 5' 8.5 (174 cm) Weight: 113.2 kg (249 lb 9 oz) IBW/kg (Calculated) : 65.05 TPN AdjBW (KG): 76.1 Body mass index is 37.39 kg/m. Usual Weight:   Assessment: Pharmacy consulted to manage TPN for nutrition support in [name] with gastric outlet obstruction 2nd mass..  PMH: HLD, prediabetes, CKDII does not take any medication on regular basis, past surgical h/o S/P total hysterectomy and BSO   Glucose / Insulin : hx of prediabetes, not on medication PTA - on sSSI qid (used 10 units insulin  aspart in previous 24 hr); CBGs over 200 this am - CBGs (goal <150): 142-240 Electrolytes: all lytes wnl including CorrCa except phos which is slightly low Renal: 8/29 scr trending down with 0.94 today (crcl~72), BUN wnl Hepatic: LFTs WNL with labs on 8/28 - albumin slightly low at 3.4 - 8/25 prealbumin 18 - low end of normal  Intake / Output; MIVF: not on mIVF - I/O: 674.1 ml - emesis output: 0 mL  - NG/OG output: 750 mL - UOP: 5 x documented  - last BM: 8/30 GI Imaging:  - 8/22 CT A/P: ? SBO from mass - 8/25: DG small bowel: severe stricture of transverse duodenum suspicious for mass & malignancy - 8/29 abd xray: Nonobstructive bowel gas pattern.  GI Surgeries / Procedures:  - 8/23 small bowel enteroscopy:  LA Grade B reflux esophagitis. Small hiatal hernia. Bilious gastric fluid. Duodenal mass. Biopsied  ++ pathology showed invasive adenocarcinoma++  Central access: PICC placed  8/26 for TPN TPN start date: 07/08/2024  Nutritional Goals: 8/30 Cyclic TPN: 1900 ml over 18 hours provides 95 g of protein and 1858 kcals per day with GIR 1.4-2.8 mg/kg/min  8/31 Cyclic TPN: 1900 ml over 14 hours provides 95 g of protein and 1858 kcals per day with GIR 1.83-3.65 mg/kg/min  RD Assessment: Estimated Needs Total Energy Estimated Needs: 1700-1900 Total Protein  Estimated Needs: 75-95 grams Total Fluid Estimated Needs: >/= 1.9 L   Current Nutrition:  Clear liquids  - Boost Breeze TID> all refused 8/26 - TPN   Plan:  Today, at 1800:  Cyclic TPN: 1900 ml over 14 hours (Start at 73 ml/hr for 1 hour, then increase rate to 146 ml/hr for 12 hours, then decrease rate to 73 ml/hr for 1 hour, then stop) Electrolytes in TPN:  Na 50 mEq/L K 50 mEq/L Ca 5 mEq/L Mg 5 mEq/L Phos 20 mmol/L - increased Cl:Ac 1:2 Add standard MVI and trace elements to TPN Change Sensitive scale SSI to every 4 hours Thiamine  100 mg IV qday  (8/26 - 8/31) mIVF per TRH Monitor TPN labs on Mon/Thurs, and PRN   Thank you for allowing pharmacy to be a part of this patient's care.  Eleanor EMERSON Agent, PharmD, BCPS Clinical Pharmacist [place] 07/13/2024 10:18 AM

## 2024-07-13 NOTE — Progress Notes (Signed)
 [facility]  PROGRESS NOTE  Lauren Gray FMW:[id number] DOB: 16-Jun-1951 DOA: 07/04/2024 PCP: Paseda, Folashade R, FNP   Brief HPI:     73 y.o. female with medical history significant of HLD, prediabetes, CKDII does not take any medication on regular basis,  past surgical h/o S/P total hysterectomy and BSO with  presents with vomiting x 1 month, can only hold down crackers and yogurt, has not had a full bowel movement in a few weeks, last vomited this morning, last bm with small pebbles this morning, +flatus, no ab pain, + weight loss of 10lbs in the last months.  Found to have gastric outlet obstruction related to a mass.  Pathology pending.  Patient considering starting TPN, would like to discuss with her husband first.   Subjective   Feels about the same as yesterday.  Denies any new complaints.  No vomiting.  No abdominal pain.  Passing gas from below.   Assessment/Plan:   Duodenal adenocarcinoma with gastric outlet obstruction CT scan shows dilated 1st and 2nd portion of duodenum with possible underlying mass. Patient was seen by gastroenterology and underwent small bowel enteroscopy.  The duodenal mass was biopsied.  Pathology is positive for adenocarcinoma. General surgery is following.  Patient has poor nutritional status.  Due to gastric outlet obstruction from the duodenal mass patient has been started on TPN.   Surgery is tentatively planned for mid September in order for her nutritional status to improve. Due to persistent nausea and vomiting NG tube was inserted.  Stable for the most part.  Chloraseptic spray and viscous lidocaine  can be used for mouth/throat discomfort.    Hypokalemia/hypophosphatemia Supplemented.  Magnesium level is 2.3.  Phosphorus levels noted to be low and can be corrected through TPN.   Essential hypertension Did have significantly elevated blood pressures yesterday.  Will place her on metoprolol  intravenously 3 times a day.  Continue with  hydralazine  as needed.  Hypoglycemia Now on TPN.  CBGs have improved.  IV fluids were discontinued.   AKI on CKDII vs CKD IIIa Appears resolved  Mild elevated lipase Resolved  DVT prophylaxis: SCDs Code Status: Full code Family Communication: family at bedside Disposition: Plan is for surgery mid-September which will be done at [facility].  Disposition to be determined subsequently.    Medications    Chlorhexidine  Gluconate Cloth  6 each Topical Daily   enoxaparin  (LOVENOX ) injection  40 mg Subcutaneous Daily   insulin  aspart  0-9 Units Subcutaneous 4 times per day   pantoprazole  (PROTONIX ) IV  40 mg Intravenous Q12H    Objective    SpO2: 98 %    Vitals:   07/13/24 0041 07/13/24 0100 07/13/24 0500 07/13/24 0610  BP: (!) 195/56 (!) 169/65  (!) 154/64  Pulse: 98 96  95  Resp: 18   16  Temp: 97.8 F (36.6 C)   98.9 F (37.2 C)  TempSrc: Oral     SpO2: 98%   98%  Weight:   113.2 kg   Height:        Physical Examination:  General appearance: Awake alert.  In no distress NG tube is noted Resp: Clear to auscultation bilaterally.  Normal effort Cardio: S1-S2 is normal regular.  No S3-S4.  No rubs murmurs or bruit GI: Abdomen is soft.  Nontender nondistended.  Bowel sounds are present normal.  No masses organomegaly No obvious focal neurological deficits.    Data Reviewed:  Basic Metabolic Panel: Recent Labs  Lab 07/09/24 0204 07/10/24 0300 07/11/24  9862 07/12/24 1335 07/13/24 0437  NA 138 140 142 140 141  K 3.7 3.9 3.9 4.0 4.2  CL 106 110 110 109 109  CO2 20* 21* 22 20* 20*  GLUCOSE 163* 178* 130* 153* 240*  BUN 10 11 17 19 23   CREATININE 1.15* 1.10* 1.09* 0.92 0.94  CALCIUM 8.7* 8.8* 9.0 9.1 9.1  MG 2.2 2.2  --   --  2.3  PHOS 2.6 2.5 3.3  --  2.4*    CBC: Recent Labs  Lab 07/07/24 0509 07/08/24 0623 07/09/24 0204  WBC 5.2 5.1 5.4  HGB 11.5* 11.7* 11.7*  HCT 34.9* 36.9 37.4  MCV 93.3 95.3 93.7  PLT 239 245 245    LFT Recent Labs   Lab 07/09/24 0204 07/10/24 0300  AST 23 16  ALT 16 14  ALKPHOS 63 60  BILITOT 0.7 0.4  PROT 6.6 6.4*  ALBUMIN 3.5 3.4*     Pia Jedlicka   [facility] If 7PM-7AM, please contact night-coverage at [email or web address], Office  [phone]   07/13/2024, 9:00 AM  LOS: 9 days

## 2024-07-14 DIAGNOSIS — C170 Malignant neoplasm of duodenum: Secondary | ICD-10-CM | POA: Diagnosis not present

## 2024-07-14 DIAGNOSIS — I10 Essential (primary) hypertension: Secondary | ICD-10-CM | POA: Diagnosis not present

## 2024-07-14 DIAGNOSIS — K311 Adult hypertrophic pyloric stenosis: Secondary | ICD-10-CM | POA: Diagnosis not present

## 2024-07-14 LAB — COMPREHENSIVE METABOLIC PANEL WITH GFR
ALT: 37 U/L (ref 0–44)
AST: 35 U/L (ref 15–41)
Albumin: 3.4 g/dL — ABNORMAL LOW (ref 3.5–5.0)
Alkaline Phosphatase: 74 U/L (ref 38–126)
Anion gap: 12 (ref 5–15)
BUN: 25 mg/dL — ABNORMAL HIGH (ref 8–23)
CO2: 20 mmol/L — ABNORMAL LOW (ref 22–32)
Calcium: 9.2 mg/dL (ref 8.9–10.3)
Chloride: 110 mmol/L (ref 98–111)
Creatinine, Ser: 0.95 mg/dL (ref 0.44–1.00)
GFR, Estimated: >60 mL/min (ref >60)
Glucose, Bld: 250 mg/dL — ABNORMAL HIGH (ref 70–99)
Potassium: 4.2 mmol/L (ref 3.5–5.1)
Sodium: 142 mmol/L (ref 135–145)
Total Bilirubin: 0.4 mg/dL (ref 0.0–1.2)
Total Protein: 6.6 g/dL (ref 6.5–8.1)

## 2024-07-14 LAB — TRIGLYCERIDES: Triglycerides: 99 mg/dL (ref <150)

## 2024-07-14 LAB — GLUCOSE, CAPILLARY
Glucose-Capillary: 123 mg/dL — ABNORMAL HIGH (ref 70–99)
Glucose-Capillary: 141 mg/dL — ABNORMAL HIGH (ref 70–99)
Glucose-Capillary: 213 mg/dL — ABNORMAL HIGH (ref 70–99)
Glucose-Capillary: 255 mg/dL — ABNORMAL HIGH (ref 70–99)
Glucose-Capillary: 264 mg/dL — ABNORMAL HIGH (ref 70–99)
Glucose-Capillary: 302 mg/dL — ABNORMAL HIGH (ref 70–99)
Glucose-Capillary: 325 mg/dL — ABNORMAL HIGH (ref 70–99)

## 2024-07-14 LAB — MAGNESIUM: Magnesium: 2.3 mg/dL (ref 1.7–2.4)

## 2024-07-14 LAB — PHOSPHORUS: Phosphorus: 2.7 mg/dL (ref 2.5–4.6)

## 2024-07-14 MED ORDER — INSULIN ASPART 100 UNIT/ML IJ SOLN: 0.0000 [IU] | INTRAMUSCULAR | Status: DC

## 2024-07-14 MED ORDER — INSULIN ASPART 100 UNIT/ML IJ SOLN
0.0000 [IU] | INTRAMUSCULAR | Status: DC
  Administered 2024-07-14: 11 [IU] via SUBCUTANEOUS
  Administered 2024-07-15: 3 [IU] via SUBCUTANEOUS
  Administered 2024-07-15: 15 [IU] via SUBCUTANEOUS

## 2024-07-14 MED ORDER — METOPROLOL TARTRATE 5 MG/5ML IV SOLN
5.0000 mg | Freq: Four times a day (QID) | INTRAVENOUS | Status: DC
  Administered 2024-07-14 – 2024-08-01 (×70): 5 mg via INTRAVENOUS
  Filled 2024-07-14 (×70): qty 5

## 2024-07-14 MED ORDER — INSULIN GLARGINE 100 UNIT/ML ~~LOC~~ SOLN
5.0000 [IU] | Freq: Every day | SUBCUTANEOUS | Status: AC
  Administered 2024-07-14 – 2024-07-15 (×2): 5 [IU] via SUBCUTANEOUS
  Filled 2024-07-14 (×2): qty 0.05

## 2024-07-14 MED ORDER — TRAVASOL 10 % IV SOLN
INTRAVENOUS | Status: AC
  Filled 2024-07-14: qty 950

## 2024-07-14 MED ORDER — INSULIN ASPART 100 UNIT/ML IJ SOLN
0.0000 [IU] | INTRAMUSCULAR | Status: DC
  Administered 2024-07-14: 7 [IU] via SUBCUTANEOUS

## 2024-07-14 MED ORDER — FLUTICASONE PROPIONATE 50 MCG/ACT NA SUSP
2.0000 | Freq: Every day | NASAL | Status: DC
  Administered 2024-07-14 – 2024-08-14 (×29): 2 via NASAL
  Filled 2024-07-14 (×3): qty 16

## 2024-07-14 MED ORDER — INSULIN ASPART 100 UNIT/ML IJ SOLN
0.0000 [IU] | INTRAMUSCULAR | Status: AC
  Administered 2024-07-14: 2 [IU] via SUBCUTANEOUS

## 2024-07-14 NOTE — Progress Notes (Signed)
 Patient ID: Lauren Gray, female   DOB: 1951/06/21, 73 y.o.   MRN: [id number] Taking a bath.  Continue ng tube for now. Surgery planned for 9/9 it appears

## 2024-07-14 NOTE — Progress Notes (Addendum)
 PHARMACY - TOTAL PARENTERAL NUTRITION CONSULT NOTE   Indication: Gastric outlet obstruction 2nd to mass   Patient Measurements: Height: 5' 8.5 (174 cm) Weight: 113.2 kg (249 lb 9 oz) IBW/kg (Calculated) : 65.05 TPN AdjBW (KG): 76.1 Body mass index is 37.39 kg/m. Usual Weight:   Assessment: Pharmacy consulted to manage TPN for nutrition support in [name] with gastric outlet obstruction 2nd mass.  PMH: HLD, prediabetes, CKDII does not take any medication on regular basis, past surgical h/o S/P total hysterectomy and BSO   Glucose / Insulin : hx of prediabetes, not on medication PTA - on sSSI qid (used 22 units insulin  aspart in previous 24 hr); CBGs over 200 this am - CBGs (goal <150): 107-323 Electrolytes: Cl higher end of range, CO2 lower end of range, all other lytes wnl including CorrCa Renal: Scr stable (BL 1-1.1),  BUN slightly elevated Hepatic: LFTs WNL with labs on 8/28 - albumin slightly low at 3.4 - 8/25 prealbumin 18 - low end of normal  - TRG wnl  Intake / Output; MIVF: not on mIVF - I/O: 524.3 ml - emesis output: 0 mL  - NG/OG output: 950 mL - UOP: 3 x documented  - last BM: 8/30 GI Imaging:  - 8/22 CT A/P: ? SBO from mass - 8/25: DG small bowel: severe stricture of transverse duodenum suspicious for mass & malignancy - 8/29 abd xray: Nonobstructive bowel gas pattern.  GI Surgeries / Procedures:  - 8/23 small bowel enteroscopy:  LA Grade B reflux esophagitis. Small hiatal hernia. Bilious gastric fluid. Duodenal mass. Biopsied  ++ pathology showed invasive adenocarcinoma++  Central access: PICC placed  8/26 for TPN TPN start date: 07/08/2024  Nutritional Goals: 8/30 Cyclic TPN: 1900 ml over 18 hours provides 95 g of protein and 1858 kcals per day with GIR 1.4-2.8 mg/kg/min  8/31 Cyclic TPN: 1900 ml over 14 hours provides 95 g of protein and 1858 kcals per day with GIR 1.83-3.65 mg/kg/min  9/1 Cyclic TPN: 1900 ml over 12 hours provides 95 g of protein and 1858  kcals per day with GIR 2.15-4.33 mg/kg/min  RD Assessment: Estimated Needs Total Energy Estimated Needs: 1700-1900 Total Protein Estimated Needs: 75-95 grams Total Fluid Estimated Needs: >/= 1.9 L   Current Nutrition:  Clear liquids  - Boost Breeze TID> all refused 8/26 - TPN   Plan:  Today, at 1800:  Cyclic TPN: 1900 ml over 12 hours (Start at 86 ml/hr for 1 hour, then increase rate to 173 ml/hr for 10 hours, then decrease rate to 86 ml/hr for 1 hour, then stop) Electrolytes in TPN:  Na 50 mEq/L K 50 mEq/L Ca 5 mEq/L Mg 5 mEq/L Phos 20 mmol/L  Cl:Ac 1:2 Add standard MVI and trace elements to TPN Adjust insulin  to resistant sensitive scale SSI (4 times per day) and, adjust times based on TPN cycle Thiamine  100 mg IV qday  (8/26 - 8/31) mIVF per TRH Monitor TPN labs on Mon/Thurs, and BMET/Mg/Phos tomorrow morning   Thank you for allowing pharmacy to be a part of this patient's care.  Marget Hench, PharmD Clinical Pharmacist 07/14/2024 7:22 AM

## 2024-07-14 NOTE — Progress Notes (Signed)
 [facility]  PROGRESS NOTE  [name] FMW:[id number] DOB: May 20, 1951 DOA: 07/04/2024 PCP: Paseda, Folashade R, FNP   Brief HPI:     73 y.o. female with medical history significant of HLD, prediabetes, CKDII does not take any medication on regular basis,  past surgical h/o S/P total hysterectomy and BSO with  presents with vomiting x 1 month, can only hold down crackers and yogurt, has not had a full bowel movement in a few weeks, last vomited this morning, last bm with small pebbles this morning, +flatus, no ab pain, + weight loss of 10lbs in the last months.  Found to have gastric outlet obstruction related to a mass.  Pathology pending.  Patient considering starting TPN, would like to discuss with her husband first.   Subjective   Denies any abdominal pain nausea or vomiting.  Sister at the bedside.   Assessment/Plan:   Duodenal adenocarcinoma with gastric outlet obstruction CT scan shows dilated 1st and 2nd portion of duodenum with possible underlying mass. Patient was seen by gastroenterology and underwent small bowel enteroscopy.  The duodenal mass was biopsied.  Pathology is positive for adenocarcinoma. General surgery is following.  Patient has poor nutritional status.  Due to gastric outlet obstruction from the duodenal mass patient has been started on TPN.   Surgery is tentatively planned for mid September in order for her nutritional status to improve. Due to persistent nausea and vomiting NG tube was inserted.   Chloraseptic spray and viscous lidocaine  can be used for mouth/throat discomfort.  Remains stable for the most part.   Hypokalemia/hypophosphatemia Continue to monitor and supplement as indicated.  Most of the supplementation is through TPN.     Essential hypertension Increase the dose of scheduled IV metoprolol  for better blood pressure control.    Hyperglycemia Was initially hypoglycemic.  Now noted to be hyperglycemic.  She is noted to be on every 4  hour coverage.  We will increase the coverage to moderate.  Consider long-acting insulin .   HbA1c 6.1.  AKI on CKDII vs CKD IIIa Appears resolved  Mild elevated lipase Resolved  DVT prophylaxis: SCDs Code Status: Full code Family Communication: family at bedside Disposition: Plan is for surgery mid-September which will be done at [facility].  Disposition to be determined subsequently.    Medications    Chlorhexidine  Gluconate Cloth  6 each Topical Daily   enoxaparin  (LOVENOX ) injection  40 mg Subcutaneous Daily   fluticasone   2 spray Each Nare Daily   insulin  aspart  0-9 Units Subcutaneous Q4H   metoprolol  tartrate  5 mg Intravenous Q6H   pantoprazole  (PROTONIX ) IV  40 mg Intravenous Q12H    Objective   Vitals:   07/13/24 1715 07/13/24 2153 07/14/24 0552 07/14/24 0652  BP: (!) 172/58 (!) 176/63 (!) 175/68 (!) 152/45  Pulse: 94 91 (!) 103 83  Resp:  16 16   Temp:  98.8 F (37.1 C) 99.1 F (37.3 C)   TempSrc:  Oral Oral   SpO2:  99% 99%   Weight:      Height:        Physical Examination:  General appearance: Awake alert.  In no distress Resp: Clear to auscultation bilaterally.  Normal effort Cardio: S1-S2 is normal regular.  No S3-S4.  No rubs murmurs or bruit GI: Abdomen is soft.   No focal neurological deficits noted.   Data Reviewed:  Basic Metabolic Panel: Recent Labs  Lab 07/09/24 0204 07/10/24 0300 07/11/24 0137 07/12/24 1335 07/13/24 0437 07/14/24  0310  NA 138 140 142 140 141 142  K 3.7 3.9 3.9 4.0 4.2 4.2  CL 106 110 110 109 109 110  CO2 20* 21* 22 20* 20* 20*  GLUCOSE 163* 178* 130* 153* 240* 250*  BUN 10 11 17 19 23  25*  CREATININE 1.15* 1.10* 1.09* 0.92 0.94 0.95  CALCIUM 8.7* 8.8* 9.0 9.1 9.1 9.2  MG 2.2 2.2  --   --  2.3 2.3  PHOS 2.6 2.5 3.3  --  2.4* 2.7    CBC: Recent Labs  Lab 07/08/24 0623 07/09/24 0204  WBC 5.1 5.4  HGB 11.7* 11.7*  HCT 36.9 37.4  MCV 95.3 93.7  PLT 245 245    LFT Recent Labs  Lab 07/09/24 0204  07/10/24 0300 07/14/24 0310  AST 23 16 35  ALT 16 14 37  ALKPHOS 63 60 74  BILITOT 0.7 0.4 0.4  PROT 6.6 6.4* 6.6  ALBUMIN 3.5 3.4* 3.4*     Lauren Gray   [facility] If 7PM-7AM, please contact night-coverage at [email or web address], Office  [phone]   07/14/2024, 9:15 AM  LOS: 10 days

## 2024-07-14 NOTE — Plan of Care (Signed)

## 2024-07-14 NOTE — Plan of Care (Signed)
  Problem: Education: Goal: Knowledge of General Education information will improve Description: Including pain rating scale, medication(s)/side effects and non-pharmacologic comfort measures Outcome: Progressing   Problem: Health Behavior/Discharge Planning: Goal: Ability to manage health-related needs will improve Outcome: Progressing   Problem: Clinical Measurements: Goal: Diagnostic test results will improve Outcome: Progressing Goal: Respiratory complications will improve Outcome: Progressing Goal: Cardiovascular complication will be avoided Outcome: Progressing   Problem: Activity: Goal: Risk for activity intolerance will decrease Outcome: Progressing   Problem: Nutrition: Goal: Adequate nutrition will be maintained Outcome: Progressing   Problem: Coping: Goal: Level of anxiety will decrease Outcome: Progressing   Problem: Elimination: Goal: Will not experience complications related to bowel motility Outcome: Progressing Goal: Will not experience complications related to urinary retention Outcome: Progressing   Problem: Pain Managment: Goal: General experience of comfort will improve and/or be controlled Outcome: Progressing

## 2024-07-15 DIAGNOSIS — I10 Essential (primary) hypertension: Secondary | ICD-10-CM | POA: Diagnosis not present

## 2024-07-15 DIAGNOSIS — C170 Malignant neoplasm of duodenum: Secondary | ICD-10-CM | POA: Diagnosis not present

## 2024-07-15 DIAGNOSIS — K311 Adult hypertrophic pyloric stenosis: Secondary | ICD-10-CM | POA: Diagnosis not present

## 2024-07-15 LAB — CBC
HCT: 37.6 % (ref 36.0–46.0)
Hemoglobin: 11.5 g/dL — ABNORMAL LOW (ref 12.0–15.0)
MCH: 29.4 pg (ref 26.0–34.0)
MCHC: 30.6 g/dL (ref 30.0–36.0)
MCV: 96.2 fL (ref 80.0–100.0)
Platelets: 226 K/uL (ref 150–400)
RBC: 3.91 MIL/uL (ref 3.87–5.11)
RDW: 13 % (ref 11.5–15.5)
WBC: 7.9 K/uL (ref 4.0–10.5)
nRBC: 0 % (ref 0.0–0.2)

## 2024-07-15 LAB — GLUCOSE, CAPILLARY
Glucose-Capillary: 111 mg/dL — ABNORMAL HIGH (ref 70–99)
Glucose-Capillary: 131 mg/dL — ABNORMAL HIGH (ref 70–99)
Glucose-Capillary: 181 mg/dL — ABNORMAL HIGH (ref 70–99)
Glucose-Capillary: 245 mg/dL — ABNORMAL HIGH (ref 70–99)
Glucose-Capillary: 321 mg/dL — ABNORMAL HIGH (ref 70–99)
Glucose-Capillary: 354 mg/dL — ABNORMAL HIGH (ref 70–99)

## 2024-07-15 LAB — BASIC METABOLIC PANEL WITH GFR
Anion gap: 11 (ref 5–15)
BUN: 27 mg/dL — ABNORMAL HIGH (ref 8–23)
CO2: 21 mmol/L — ABNORMAL LOW (ref 22–32)
Calcium: 9.3 mg/dL (ref 8.9–10.3)
Chloride: 110 mmol/L (ref 98–111)
Creatinine, Ser: 0.92 mg/dL (ref 0.44–1.00)
GFR, Estimated: >60 mL/min (ref >60)
Glucose, Bld: 287 mg/dL — ABNORMAL HIGH (ref 70–99)
Potassium: 4.3 mmol/L (ref 3.5–5.1)
Sodium: 142 mmol/L (ref 135–145)

## 2024-07-15 LAB — PHOSPHORUS: Phosphorus: 2.7 mg/dL (ref 2.5–4.6)

## 2024-07-15 LAB — MAGNESIUM: Magnesium: 2.5 mg/dL — ABNORMAL HIGH (ref 1.7–2.4)

## 2024-07-15 MED ORDER — INSULIN ASPART 100 UNIT/ML IJ SOLN
0.0000 [IU] | INTRAMUSCULAR | Status: DC
  Administered 2024-07-15 – 2024-07-16 (×2): 5 [IU] via SUBCUTANEOUS
  Administered 2024-07-16: 3 [IU] via SUBCUTANEOUS
  Administered 2024-07-16 (×2): 11 [IU] via SUBCUTANEOUS
  Administered 2024-07-17: 5 [IU] via SUBCUTANEOUS

## 2024-07-15 MED ORDER — LORAZEPAM 2 MG/ML IJ SOLN
0.5000 mg | Freq: Two times a day (BID) | INTRAMUSCULAR | Status: DC | PRN
  Administered 2024-07-26 – 2024-07-30 (×7): 0.5 mg via INTRAVENOUS
  Filled 2024-07-15 (×9): qty 1

## 2024-07-15 MED ORDER — INSULIN ASPART 100 UNIT/ML IJ SOLN
0.0000 [IU] | INTRAMUSCULAR | Status: AC
  Administered 2024-07-15: 2 [IU] via SUBCUTANEOUS

## 2024-07-15 MED ORDER — TRAVASOL 10 % IV SOLN
INTRAVENOUS | Status: AC
  Filled 2024-07-15: qty 950

## 2024-07-15 NOTE — Progress Notes (Signed)
    9 Days Post-Op  Subjective: CC: No abdominal pain, n/v. BM this AM. NGT w/ 900cc/24 hours, bilious.   Objective: Vital signs in last 24 hours: Temp:  [98.1 F (36.7 C)-99.8 F (37.7 C)] 98.7 F (37.1 C) (09/02 0518) Pulse Rate:  [85-97] 90 (09/02 0857) Resp:  [14-18] 18 (09/02 0518) BP: (146-188)/(41-88) 146/57 (09/02 0857) SpO2:  [96 %-100 %] 96 % (09/02 0518) Weight:  [884 kg] 115 kg (09/02 0500) Last BM Date : 07/15/24  Intake/Output from previous day: 09/01 0701 - 09/02 0700 In: 1789.2 [P.O.:30; I.V.:1759.2] Out: 900 [Emesis/NG output:900] Intake/Output this shift: Total I/O In: -  Out: 300 [Emesis/NG output:300]  PE: Gen:  Alert, NAD, pleasant Abd: Soft, NT, NGT bilious   Lab Results:  Recent Labs    07/15/24 0627  WBC 7.9  HGB 11.5*  HCT 37.6  PLT 226   BMET Recent Labs    07/14/24 0310 07/15/24 0627  NA 142 142  K 4.2 4.3  CL 110 110  CO2 20* 21*  GLUCOSE 250* 287*  BUN 25* 27*  CREATININE 0.95 0.92  CALCIUM 9.2 9.3   PT/INR No results for input(s): LABPROT, INR in the last 72 hours. CMP     Component Value Date/Time   NA 142 07/15/2024 0627   NA 142 06/18/2024 1445   K 4.3 07/15/2024 0627   CL 110 07/15/2024 0627   CO2 21 (L) 07/15/2024 0627   GLUCOSE 287 (H) 07/15/2024 0627   BUN 27 (H) 07/15/2024 0627   BUN 14 06/18/2024 1445   CREATININE 0.92 07/15/2024 0627   CREATININE 1.12 (H) 12/27/2021 0846   CALCIUM 9.3 07/15/2024 0627   PROT 6.6 07/14/2024 0310   PROT 7.2 06/18/2024 1445   ALBUMIN 3.4 (L) 07/14/2024 0310   ALBUMIN 4.3 06/18/2024 1445   AST 35 07/14/2024 0310   ALT 37 07/14/2024 0310   ALKPHOS 74 07/14/2024 0310   BILITOT 0.4 07/14/2024 0310   BILITOT 0.4 06/18/2024 1445   GFRNONAA >60 07/15/2024 0627   Lipase     Component Value Date/Time   LIPASE 34 07/06/2024 0441    Studies/Results: No results found.  Anti-infectives: Anti-infectives (From admission, onward)    None         Assessment/Plan Gastric outlet obstruction secondary to mass in D3, invasive adenocarcinoma - s/p EGD with bx 8/24.  Path with invasive adenocarcinoma - PICC/TPN - Surgery appears to be scheduled for 9/9 at [facility].  We discussed that she may require her NGT in until then.  - Will need to tx to [facility] for surgery at some point    FEN - NPO x ice chips and few sips, NGT to LIWS, TPN VTE - SCDs, Lovenox  ID - None currently needed  I reviewed nursing notes, last 24 h vitals and pain scores, last 48 h intake and output, last 24 h labs and trends, and last 24 h imaging results.    LOS: 11 days    Ozell CHRISTELLA Shaper, [facility] 07/15/2024, 12:26 PM Please see [employer] for pager number during day hours 7:00am-4:30pm

## 2024-07-15 NOTE — Progress Notes (Signed)
 [facility]  PROGRESS NOTE  Lauren Gray FMW:[id number] DOB: 1951-02-24 DOA: 07/04/2024 PCP: Paseda, Folashade R, FNP   Brief HPI:     73 y.o. female with medical history significant of HLD, prediabetes, CKDII does not take any medication on regular basis,  past surgical h/o S/P total hysterectomy and BSO with  presents with vomiting x 1 month, can only hold down crackers and yogurt, has not had a full bowel movement in a few weeks, last vomited this morning, last bm with small pebbles this morning, +flatus, no ab pain, + weight loss of 10lbs in the last months.  Found to have gastric outlet obstruction related to a mass.  Pathology pending.  Patient considering starting TPN, would like to discuss with her husband first.   Subjective   Complains of a lot of anxiety this morning.  Denies any nausea.  Had a bowel movement yesterday.  Denies any abdominal pain.   Assessment/Plan:   Duodenal adenocarcinoma with gastric outlet obstruction CT scan shows dilated 1st and 2nd portion of duodenum with possible underlying mass. Patient was seen by gastroenterology and underwent small bowel enteroscopy.  The duodenal mass was biopsied.  Pathology is positive for adenocarcinoma. General surgery is following.  Patient has poor nutritional status.  Due to gastric outlet obstruction from the duodenal mass patient has been started on TPN.   Surgery is tentatively planned for mid September in order for her nutritional status to improve. Due to persistent nausea and vomiting NG tube was inserted.   Chloraseptic spray and viscous lidocaine  can be used for mouth/throat discomfort.  Remains stable for the most part.   Hypokalemia/hypophosphatemia Continue to monitor and supplement as indicated.  Most of the supplementation is through TPN.     Essential hypertension Continue scheduled IV metoprolol .  Anxiety is contributing to elevated blood pressure readings.  Situational anxiety Ativan  as  needed.  Hyperglycemia Was initially hypoglycemic.  Now noted to be hyperglycemic.  She is noted to be on every 4 hour coverage.  Also getting insulin  through TPN.  Long-acting insulin  has been discontinued this morning as insulin  dose through TPN is to be increased significantly.   HbA1c 6.1.  AKI on CKDII vs CKD IIIa Appears resolved  Mild elevated lipase Resolved  DVT prophylaxis: SCDs Code Status: Full code Family Communication: family at bedside Disposition: Plan is for surgery mid-September which will be done at [facility].  Disposition to be determined subsequently.    Medications    Chlorhexidine  Gluconate Cloth  6 each Topical Daily   enoxaparin  (LOVENOX ) injection  40 mg Subcutaneous Daily   fluticasone   2 spray Each Nare Daily   insulin  aspart  0-15 Units Subcutaneous Q4H   insulin  aspart  0-15 Units Subcutaneous 4 times per day   metoprolol  tartrate  5 mg Intravenous Q6H   pantoprazole  (PROTONIX ) IV  40 mg Intravenous Q12H    Objective   Vitals:   07/15/24 0411 07/15/24 0500 07/15/24 0518 07/15/24 0800  BP: (!) 171/88  (!) 176/72 (!) 174/73  Pulse: 97  92 85  Resp: 16  18   Temp: 99.1 F (37.3 C)  98.7 F (37.1 C)   TempSrc: Oral  Oral   SpO2: 97%  96%   Weight:  115 kg    Height:        Physical Examination:  General appearance: Awake alert.  In no distress Resp: Clear to auscultation bilaterally.  Normal effort Cardio: S1-S2 is normal regular.  No S3-S4.  No rubs murmurs or bruit   Data Reviewed:  Basic Metabolic Panel: Recent Labs  Lab 07/09/24 0204 07/10/24 0300 07/11/24 0137 07/12/24 1335 07/13/24 0437 07/14/24 0310 07/15/24 0627  NA 138 140 142 140 141 142 142  K 3.7 3.9 3.9 4.0 4.2 4.2 4.3  CL 106 110 110 109 109 110 110  CO2 20* 21* 22 20* 20* 20* 21*  GLUCOSE 163* 178* 130* 153* 240* 250* 287*  BUN 10 11 17 19 23  25* 27*  CREATININE 1.15* 1.10* 1.09* 0.92 0.94 0.95 0.92  CALCIUM 8.7* 8.8* 9.0 9.1 9.1 9.2 9.3  MG 2.2 2.2  --    --  2.3 2.3 2.5*  PHOS 2.6 2.5 3.3  --  2.4* 2.7 2.7    CBC: Recent Labs  Lab 07/09/24 0204 07/15/24 0627  WBC 5.4 7.9  HGB 11.7* 11.5*  HCT 37.4 37.6  MCV 93.7 96.2  PLT 245 226    LFT Recent Labs  Lab 07/09/24 0204 07/10/24 0300 07/14/24 0310  AST 23 16 35  ALT 16 14 37  ALKPHOS 63 60 74  BILITOT 0.7 0.4 0.4  PROT 6.6 6.4* 6.6  ALBUMIN 3.5 3.4* 3.4*     Lauren Gray   [facility] If 7PM-7AM, please contact night-coverage at [email or web address], Office  [phone]   07/15/2024, 8:43 AM  LOS: 11 days

## 2024-07-15 NOTE — Progress Notes (Signed)
 PHARMACY - TOTAL PARENTERAL NUTRITION CONSULT NOTE   Indication: Gastric outlet obstruction 2nd to mass   Patient Measurements: Height: 5' 8.5 (174 cm) Weight: 115 kg (253 lb 8.5 oz) IBW/kg (Calculated) : 65.05 TPN AdjBW (KG): 76.1 Body mass index is 37.99 kg/m. Usual Weight:   Assessment: Pharmacy consulted to manage TPN for nutrition support in [name] with gastric outlet obstruction 2nd mass.  PMH: HLD, prediabetes, CKDII does not take any medication on regular basis, past surgical h/o S/P total hysterectomy and BSO   Glucose / Insulin : hx of prediabetes, not on medication PTA - SSI changed to rSSI 4x/day yesterday (9/1) but TRH then changed to mSSI q4h around midnight - on mSSI (used 45 units insulin  aspart in 24 hr ---> 33 units during TPN infusion and 12 units while off TPN) CBGs (goal; <150): 213 (2 hours past cyclic TPN start); 181 (1 hour past cyclic TPN d/c); 325, (middle of TPN infusion); 141 (while off TPN) - lantus  5 units daily started in 9/1 by MD Electrolytes: CO2 slightly low at 21, Mag slightly elevated at 2.5; all other lytes wnl including CorrCa Renal: Scr stable (BL 1-1.1),  BUN elevated at 27 Hepatic: LFTs WNL with labs on 8/28 - albumin slightly low at 3.4 - 8/25 prealbumin 18 - low end of normal  - TG 99 (9/1) Intake / Output; MIVF: not on mIVF - I/O: +889 mL - NG/OG output: - UOP: 8 x documented  - last BM: 9/2 GI Imaging:  - 8/22 CT A/P: ? SBO from mass - 8/25: DG small bowel: severe stricture of transverse duodenum suspicious for mass & malignancy - 8/28 abd xray: Nonobstructive bowel gas pattern.  GI Surgeries / Procedures:  - 8/23 small bowel enteroscopy:  LA Grade B reflux esophagitis. Small hiatal hernia. Bilious gastric fluid. Duodenal mass. Biopsied  ++ pathology showed invasive adenocarcinoma++  Central access: PICC placed  8/26 for TPN TPN start date: 07/08/2024  Nutritional Goals: - 8/30 Cyclic TPN: 1900 ml over 18 hours provides  95 g of protein and 1858 kcals per day with GIR 1.4-2.8 mg/kg/min  - 8/31 Cyclic TPN: 1900 ml over 14 hours provides 95 g of protein and 1858 kcals per day with GIR 1.83-3.65 mg/kg/min  - 9/1 Cyclic TPN: 1900 ml over 12 hours provides 95 g of protein and 1858 kcals per day with GIR 2.15-4.33 mg/kg/min  RD Assessment: Estimated Needs Total Energy Estimated Needs: 1700-1900 Total Protein Estimated Needs: 75-95 grams Total Fluid Estimated Needs: >/= 1.9 L   Current Nutrition:  Clear liquids  - Thiamine  100 mg IV qday  8/26 - 8/31 - 8/25 Boost Breeze TID ( refused all doses) >> d/ced on 8/28 - TPN   Plan:   Today, at 1800:  - Cyclic TPN: 1900 ml over 12 hours (Start at 86 ml/hr for 1 hour, then increase rate to 173 ml/hr for 10 hours, then decrease rate to 86 ml/hr for 1 hour, then stop) - Electrolytes in TPN:  Na 50 mEq/L K 50 mEq/L Ca 5 mEq/L Reduce Mg to 3 mEq/L Phos 20 mmol/L Cl:Ac 1:2 - Add standard MVI and trace elements to TPN - Add 25 units of insulin  to TPN bag - Will d/c Lantus  after today's dose since we are adding insulin  to TPN bag - Change mSSI back to 4 times per day and, adjust times based on TPN cycle SSI/CBGs: 2 hours past cyclic TPN start + 1 hour past cyclic TPN d/c + middle  of TPN infusion + while off TPN --> to be collected at 2000, 0100, 0700, 1200 - mIVF per TRH - BMET and mag on 07/16/24 - Monitor TPN labs on Mon/Thurs   Iantha Batch, PharmD, BCPS 07/15/2024 7:27 AM

## 2024-07-16 DIAGNOSIS — K56609 Unspecified intestinal obstruction, unspecified as to partial versus complete obstruction: Secondary | ICD-10-CM | POA: Diagnosis not present

## 2024-07-16 LAB — GLUCOSE, CAPILLARY
Glucose-Capillary: 319 mg/dL — ABNORMAL HIGH (ref 70–99)
Glucose-Capillary: 104 mg/dL — ABNORMAL HIGH (ref 70–99)
Glucose-Capillary: 151 mg/dL — ABNORMAL HIGH (ref 70–99)
Glucose-Capillary: 203 mg/dL — ABNORMAL HIGH (ref 70–99)

## 2024-07-16 LAB — BASIC METABOLIC PANEL WITH GFR
Anion gap: 11 (ref 5–15)
BUN: 27 mg/dL — ABNORMAL HIGH (ref 8–23)
CO2: 21 mmol/L — ABNORMAL LOW (ref 22–32)
Calcium: 9 mg/dL (ref 8.9–10.3)
Chloride: 109 mmol/L (ref 98–111)
Creatinine, Ser: 1 mg/dL (ref 0.44–1.00)
GFR, Estimated: 59 mL/min — ABNORMAL LOW (ref >60)
Glucose, Bld: 347 mg/dL — ABNORMAL HIGH (ref 70–99)
Potassium: 4.2 mmol/L (ref 3.5–5.1)
Sodium: 142 mmol/L (ref 135–145)

## 2024-07-16 LAB — MAGNESIUM: Magnesium: 2.2 mg/dL (ref 1.7–2.4)

## 2024-07-16 MED ORDER — TRAVASOL 10 % IV SOLN
INTRAVENOUS | Status: AC
  Filled 2024-07-16: qty 950

## 2024-07-16 NOTE — Progress Notes (Signed)
    10 Days Post-Op  Subjective: CC: No abdominal pain, n/v. BM yesterday. NGT w/ 650cc/24 hours, bilious.   Objective: Vital signs in last 24 hours: Temp:  [98.2 F (36.8 C)-98.6 F (37 C)] 98.6 F (37 C) (09/03 0647) Pulse Rate:  [88-96] 93 (09/03 0647) Resp:  [16-18] 18 (09/03 0647) BP: (146-187)/(63-70) 187/69 (09/03 0647) SpO2:  [95 %-97 %] 95 % (09/03 0647) Weight:  [884 kg] 115 kg (09/03 0445) Last BM Date : 07/15/24  Intake/Output from previous day: 09/02 0701 - 09/03 0700 In: 1846.2 [P.O.:30; I.V.:1816.2] Out: 650 [Emesis/NG output:650] Intake/Output this shift: No intake/output data recorded.  PE: Gen:  Alert, NAD, pleasant Abd: Soft, NT, NGT bilious   Lab Results:  Recent Labs    07/15/24 0627  WBC 7.9  HGB 11.5*  HCT 37.6  PLT 226   BMET Recent Labs    07/15/24 0627 07/16/24 0129  NA 142 142  K 4.3 4.2  CL 110 109  CO2 21* 21*  GLUCOSE 287* 347*  BUN 27* 27*  CREATININE 0.92 1.00  CALCIUM 9.3 9.0   PT/INR No results for input(s): LABPROT, INR in the last 72 hours. CMP     Component Value Date/Time   NA 142 07/16/2024 0129   NA 142 06/18/2024 1445   K 4.2 07/16/2024 0129   CL 109 07/16/2024 0129   CO2 21 (L) 07/16/2024 0129   GLUCOSE 347 (H) 07/16/2024 0129   BUN 27 (H) 07/16/2024 0129   BUN 14 06/18/2024 1445   CREATININE 1.00 07/16/2024 0129   CREATININE 1.12 (H) 12/27/2021 0846   CALCIUM 9.0 07/16/2024 0129   PROT 6.6 07/14/2024 0310   PROT 7.2 06/18/2024 1445   ALBUMIN 3.4 (L) 07/14/2024 0310   ALBUMIN 4.3 06/18/2024 1445   AST 35 07/14/2024 0310   ALT 37 07/14/2024 0310   ALKPHOS 74 07/14/2024 0310   BILITOT 0.4 07/14/2024 0310   BILITOT 0.4 06/18/2024 1445   GFRNONAA 59 (L) 07/16/2024 0129   Lipase     Component Value Date/Time   LIPASE 34 07/06/2024 0441    Studies/Results: No results found.  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  Path with invasive adenocarcinoma - PICC/TPN - Surgery appears to be scheduled for 9/9 at [facility]  We discussed that she may require her NGT in until then.  - Will need to tx to [facility] for surgery at some point    FEN - NPO x ice chips and few sips, NGT to LIWS, TPN VTE - SCDs, Lovenox  ID - None currently needed  I reviewed nursing notes, last 24 h vitals and pain scores, last 48 h intake and output, last 24 h labs and trends, and last 24 h imaging results.    LOS: 12 days    Lauren Gray, [facility] Surgery 07/16/2024, 9:16 AM Please see Amion for pager number during day hours 7:00am-4:30pm

## 2024-07-16 NOTE — Progress Notes (Signed)
 [facility]  PROGRESS NOTE  Lauren Gray FMW:[id number] DOB: [birth year] DOA: 07/04/2024 PCP: Paseda, Folashade R, FNP   Brief HPI:     73 y.o. female with medical history significant of HLD, prediabetes, CKDII does not take any medication on regular basis,  past surgical h/o S/P total hysterectomy and BSO with  presents with vomiting x 1 month, can only hold down crackers and yogurt, has not had a full bowel movement in a few weeks, last vomited this morning, last bm with small pebbles this morning, +flatus, no ab pain, + weight loss of 10lbs in the last months.  Found to have gastric outlet obstruction related to a mass.  Plan is for surgery at [facility] on 9/9-will need to be transferred over 2 to 3 days prior   Subjective   No nausea, would like to have sips of water   Assessment/Plan:   Duodenal adenocarcinoma with gastric outlet obstruction CT scan shows dilated 1st and 2nd portion of duodenum with possible underlying mass. Patient was seen by gastroenterology and underwent small bowel enteroscopy.  The duodenal mass was biopsied.  Pathology is positive for adenocarcinoma. General surgery is following.  Patient has poor nutritional status.  Due to gastric outlet obstruction from the duodenal mass patient has been started on TPN.   Surgery is planned for 9/9 Due to persistent nausea and vomiting NG tube was inserted.   Chloraseptic spray and viscous lidocaine  can be used for mouth/throat discomfort.  Remains stable for the most part.   Hypokalemia/hypophosphatemia Continue to monitor and supplement as indicated.  Most of the supplementation is through TPN.     Essential hypertension Continue scheduled IV metoprolol .  Anxiety is contributing to elevated blood pressure readings.  Situational anxiety Ativan  as needed.  Hyperglycemia Was initially hypoglycemic.  Now noted to be hyperglycemic.  She is noted to be on every 4 hour coverage.  Also getting insulin   through TPN.  Long-acting insulin  has been discontinued this morning as insulin  dose through TPN is to be increased significantly.   HbA1c 6.1.  AKI on CKDII vs CKD IIIa Appears resolved  Mild elevated lipase Resolved  DVT prophylaxis: SCDs Code Status: Full code Family Communication: family at bedside Disposition: Plan is for surgery mid-September which will be done at [facility].  Disposition to be determined subsequently.    Medications    Chlorhexidine  Gluconate Cloth  6 each Topical Daily   enoxaparin  (LOVENOX ) injection  40 mg Subcutaneous Daily   fluticasone   2 spray Each Nare Daily   insulin  aspart  0-15 Units Subcutaneous 4 times per day   metoprolol  tartrate  5 mg Intravenous Q6H   pantoprazole  (PROTONIX ) IV  40 mg Intravenous Q12H    Objective   Vitals:   07/15/24 1807 07/15/24 2025 07/16/24 0445 07/16/24 0647  BP: (!) 186/68 (!) 161/70  (!) 187/69  Pulse: 96 91  93  Resp:  17  18  Temp:  98.6 F (37 C)  98.6 F (37 C)  TempSrc:  Oral  Oral  SpO2:  95%  95%  Weight:   115 kg   Height:        Physical Examination:   General: Appearance:    Obese female in no acute distress     Lungs:     respirations unlabored  Heart:    Normal heart rate.    MS:   All extremities are intact.   Neurologic:   Awake, alert  Data Reviewed:  Basic Metabolic Panel: Recent Labs  Lab 07/10/24 0300 07/11/24 0137 07/12/24 1335 07/13/24 0437 07/14/24 0310 07/15/24 0627 07/16/24 0129  NA 140 142 140 141 142 142 142  K 3.9 3.9 4.0 4.2 4.2 4.3 4.2  CL 110 110 109 109 110 110 109  CO2 21* 22 20* 20* 20* 21* 21*  GLUCOSE 178* 130* 153* 240* 250* 287* 347*  BUN 11 17 19 23  25* 27* 27*  CREATININE 1.10* 1.09* 0.92 0.94 0.95 0.92 1.00  CALCIUM 8.8* 9.0 9.1 9.1 9.2 9.3 9.0  MG 2.2  --   --  2.3 2.3 2.5* 2.2  PHOS 2.5 3.3  --  2.4* 2.7 2.7  --     CBC: Recent Labs  Lab 07/15/24 0627  WBC 7.9  HGB 11.5*  HCT 37.6  MCV 96.2  PLT 226    LFT Recent Labs   Lab 07/10/24 0300 07/14/24 0310  AST 16 35  ALT 14 37  ALKPHOS 60 74  BILITOT 0.4 0.4  PROT 6.4* 6.6  ALBUMIN 3.4* 3.4*     Lauren Gray Lauren Gray   [facility] If 7PM-7AM, please contact night-coverage at [email or web address], Office  [phone]   07/16/2024, 11:46 AM  LOS: 12 days

## 2024-07-16 NOTE — Plan of Care (Signed)
   Problem: Clinical Measurements: Goal: Diagnostic test results will improve Outcome: Progressing

## 2024-07-16 NOTE — Progress Notes (Signed)
 Occupational Therapy Treatment and Discharge of OT services Patient Details Name: Lauren Gray MRN: [id number] DOB: 1951-10-04 Today's Date: 07/16/2024   History of present illness Pt is a 73 y/o female presenting with 1 month of nausea/vomiting. CT abdomen showed SBO from underlying mass. Mass biopsy showed + invasive adenocarcinoma. Requiring TPN. Surgery pending for Sept 9th. PMH: hypokalemia, HTN, CKD, prediabetes   OT comments  Patient seen by Occupational Therapy with no further acute OT needs identified at this time. All education has been completed and the patient has no further questions. Energy conservation handout provided and explained to address pt's OT goal along with DME and adaptive equipment education to support these concepts.  See below for any follow-up Occupational Therapy or equipment needs. OT is signing off. Thank you for this referral.       If plan is discharge home, recommend the following:  Assistance with cooking/housework   Equipment Recommendations  None recommended by OT    Recommendations for Other Services      Precautions / Restrictions Precautions Precaution/Restrictions Comments: NGT Restrictions Weight Bearing Restrictions Per Provider Order: No       Mobility Bed Mobility Overal bed mobility: Needs Assistance (Lines only)             General bed mobility comments: no physical assist, supervision for lines only and Nursing to  prep NGT for mobility.    Transfers                         Balance                                           ADL either performed or assessed with clinical judgement   ADL Overall ADL's : At baseline Eating/Feeding: NPO                                     General ADL Comments: Energy conservation ahdnout provided and pt/family educated on major concepts including the 5 P's, use of DME and AE and goal of maintaining steady energy levels throughout the day by  resting BEFORE fatigued. Otherwise, pt reports that except for managemetn of NGT she is performing all mobility and ADLs without any assistance. Pt's family present and confirmed this.    Extremity/Trunk Assessment Upper Extremity Assessment Upper Extremity Assessment: Overall WFL for tasks assessed   Lower Extremity Assessment Lower Extremity Assessment: Overall WFL for tasks assessed   Cervical / Trunk Assessment Cervical / Trunk Assessment: Normal    Vision   Vision Assessment?: No apparent visual deficits   Perception     Praxis     Communication Communication Communication: No apparent difficulties   Cognition Arousal: Alert Behavior During Therapy: WFL for tasks assessed/performed Cognition: No apparent impairments             OT - Cognition Comments: No deficits noted                 Following commands: Intact        Cueing   Cueing Techniques: Verbal cues  Exercises      Shoulder Instructions       General Comments NT. Pt reports no needs.    Pertinent Vitals/ Pain       Pain Assessment  Pain Assessment: No/denies pain  Home Living                                          Prior Functioning/Environment              Frequency  Other (comment) (Discontinue OT. Await new orders when pt is post-op for early mobilization.)        Progress Toward Goals  OT Goals(current goals can now be found in the care plan section)  Progress towards OT goals: Goals met/education completed, patient discharged from OT  Acute Rehab OT Goals Patient Stated Goal: I'm fine. I'm doing everything. OT Goal Formulation: With patient/family Time For Goal Achievement:  (d/c OT)  Plan      Co-evaluation                 AM-PAC OT 6 Clicks Daily Activity     Outcome Measure   Help from another person eating meals?: Total (NPO) Help from another person taking care of personal grooming?: None Help from another person  toileting, which includes using toliet, bedpan, or urinal?: None Help from another person bathing (including washing, rinsing, drying)?: None Help from another person to put on and taking off regular upper body clothing?: None Help from another person to put on and taking off regular lower body clothing?: None 6 Click Score: 21    End of Session    OT Visit Diagnosis: Feeding difficulties (R63.3)   Activity Tolerance     Patient Left in bed;with call bell/phone within reach;with bed alarm set;with family/visitor present   Nurse Communication          Time: 8845-8797 OT Time Calculation (min): 8 min  Charges: OT General Charges $OT Visit: 1 Visit OT Treatments $Self Care/Home Management : 8-22 mins (EC ed)  Delon, OT Acute Rehab Services Office: [phone] 07/16/2024   Delon Falter 07/16/2024, 1:19 PM

## 2024-07-16 NOTE — Progress Notes (Signed)
 Nutrition Follow-up  DOCUMENTATION CODES:   Obesity unspecified  INTERVENTION:   -TPN management per Pharmacy -Daily weights while on TPN   NUTRITION DIAGNOSIS:   Inadequate oral intake related to nausea, vomiting (GOO r/t duodenal mass) as evidenced by per patient/family report.  Improving w/ NGT  GOAL:   Patient will meet greater than or equal to 90% of their needs  Meeting with TPN  MONITOR:    (TPN)  ASSESSMENT:   73 y.o. female with medical history significant of HLD, prediabetes, CKDII does not take any medication on regular basis,  past surgical h/o S/P total hysterectomy and BSO with  presents with vomiting x 1 month, can only hold down crackers and yogurt, has not had a full bowel movement in a few weeks, last vomited this morning, last bm with small pebbles this morning, +flatus, no ab pain, + weight loss of 10lbs in the last months.  Found to have gastric outlet obstruction related to a mass.  8/22: admitted, NPO 8/23: FLD, s/p SB enteroscopy : reflux esophagitis, small hiatal hernia, duodenal mass, biopsy 8/24: CLD 8/25: CLD 8/26: TPN initiated via PICC 8/28: NPO, NGT placed to suction  Patient continues to be NPO. NGT set to suction, output: 300 ml Patient still awaiting whipple surgery 9/9. To transfer to [facility] soon.  Pt continues to receive cyclic TPN x 18 hours, providing 1858 kcals and 95g protein.  Admission weight: 241 lbs Current weight: 253 lbs  Medications reviewed.  Labs reviewed: CBGs: 104-321  Diet Order:   Diet Order             Diet NPO time specified Except for: Ice Chips, Other (See Comments)  Diet effective now                   EDUCATION NEEDS:   Education needs have been addressed  Skin:  Skin Assessment: Reviewed RN Assessment  Last BM:  9/2 -type 1  Height:   Ht Readings from Last 1 Encounters:  07/05/24 5' 8.5 (1.74 m)    Weight:   Wt Readings from Last 1 Encounters:  07/16/24 115 kg    BMI:  Body  mass index is 37.99 kg/m.  Estimated Nutritional Needs:   Kcal:  1700-1900  Protein:  75-95g  Fluid:  1.9L/day   Morna Lee, MS, RD, LDN Inpatient Clinical Dietitian Contact via Secure chat

## 2024-07-16 NOTE — Progress Notes (Addendum)
 PHARMACY - TOTAL PARENTERAL NUTRITION CONSULT NOTE   Indication: Gastric outlet obstruction 2nd to mass   Patient Measurements: Height: 5' 8.5 (174 cm) Weight: 115 kg (253 lb 8.5 oz) IBW/kg (Calculated) : 65.05 TPN AdjBW (KG): 76.1 Body mass index is 37.99 kg/m. Usual Weight:   Assessment: Pharmacy consulted to manage TPN for nutrition support in [name] with gastric outlet obstruction 2nd mass.  PMH: HLD, prediabetes, CKDII does not take any medication on regular basis, past surgical h/o S/P total hysterectomy and BSO   Glucose / Insulin : hx of prediabetes, not on medication PTA Lantus  5 units given 9/1 & 9/2> DC'd 9/2 9/2 25 units insulin  added to TPN - on mSSI with schedule based on cyclic TPN - used 32 units insulin  aspart in 24 hr & 5 units lantus   CBGs (goal; <150): 245 (2 hours past cyclic TPN start);  (1 hour past cyclic TPN d/c); 321, (middle of TPN infusion); 111 - 131 (while off TPN)  Electrolytes: CO2 slightly low at 21, Mag now WNL after decreased in TPN; all other lytes wnl including CorrCa Renal: Scr stable (BL 1-1.1),  BUN elevated at 27 Hepatic: LFTs WNL with labs on 8/28 - albumin slightly low at 3.4 - 8/25 prealbumin 18 - low end of normal  - TG 99 (9/1) Intake / Output; MIVF: not on mIVF - I/O: 601 mL - NG/OG output: 550 mL - UOP: 4 x documented  - last BM: 9/2 GI Imaging:  - 8/22 CT A/P: ? SBO from mass - 8/25: DG small bowel: severe stricture of transverse duodenum suspicious for mass & malignancy - 8/28 abd xray: Nonobstructive bowel gas pattern.  GI Surgeries / Procedures:  - 8/23 small bowel enteroscopy:  LA Grade B reflux esophagitis. Small hiatal hernia. Bilious gastric fluid. Duodenal mass. Biopsied  ++ pathology showed invasive adenocarcinoma++  Central access: PICC placed  8/26 for TPN TPN start date: 07/08/2024  Nutritional Goals: Cyclic TPN: 1900 ml over 18 hours provides 95 g of protein and 1858 kcals per day with GIR 1.4-2.8 mg/kg/min    RD Assessment: Estimated Needs Total Energy Estimated Needs: 1700-1900 Total Protein Estimated Needs: 75-95 grams Total Fluid Estimated Needs: >/= 1.9 L   Current Nutrition:  NPO TPN  - Thiamine  100 mg IV qday  8/26 - 8/31 - 8/25 Boost Breeze TID ( refused all doses) >> d/ced on 8/28 - TPN   Plan:  Today, at 1800:  - change Cyclic TPN from 12 hours to 18 hours to improve glucose control: 1900 ml over 18 hours to provide 100% of goals - Electrolytes in TPN:  Na 50 mEq/L K 50 mEq/L Ca 5 mEq/L Mg 3 mEq/L Phos 20 mmol/L Cl:Ac 1:2 - Add standard MVI and trace elements to TPN - Increase insulin  in TPN to 40 units of insulin / bag  - continue mSSI  4 times per day with times based on TPN cycle SSI/CBGs: 2 hours past cyclic TPN start + 1 hour past cyclic TPN d/c + middle of TPN infusion + while off TPN --> to be collected at 2000, 0300, 1300, 1700 - mIVF per TRH - none currently - Monitor TPN labs on Mon/Thurs   Rosaline IVAR Edison, Pharm.D Use secure chat for questions 07/16/2024 8:08 AM

## 2024-07-16 NOTE — TOC Progression Note (Signed)
 Transition of Care [facility]) - Progression Note    Patient Details  Name: Lauren Gray MRN: [id number] Date of Birth: [birth year]  [facility]) CM/SW Contact  Alfonse JONELLE Rex, RN Phone Number: 07/16/2024, 2:27 PM  Clinical Narrative:    Plan is for surgery at [facility] on 9/9-will need to be transferred over 2 to 3 days prior . Continue PICC, TPN, NG tube. TOC will continue to follow.     Expected Discharge Plan: Home/Self Care Barriers to Discharge: Continued Medical Work up               Expected Discharge Plan and Services       Living arrangements for the past 2 months: Single Family Home                                       Social Drivers of Health (SDOH) Interventions SDOH Screenings   Depression (PHQ2-9): Low Risk  (06/18/2024)  Social Connections: Patient Declined (07/05/2024)  Tobacco Use: Low Risk  (07/06/2024)    Readmission Risk Interventions    07/07/2024   12:30 PM  Readmission Risk Prevention Plan  Post Dischage Appt Complete  Medication Screening Complete  Transportation Screening Complete

## 2024-07-17 DIAGNOSIS — K56609 Unspecified intestinal obstruction, unspecified as to partial versus complete obstruction: Secondary | ICD-10-CM | POA: Diagnosis not present

## 2024-07-17 LAB — GLUCOSE, CAPILLARY
Glucose-Capillary: 104 mg/dL — ABNORMAL HIGH (ref 70–99)
Glucose-Capillary: 196 mg/dL — ABNORMAL HIGH (ref 70–99)
Glucose-Capillary: 211 mg/dL — ABNORMAL HIGH (ref 70–99)
Glucose-Capillary: 220 mg/dL — ABNORMAL HIGH (ref 70–99)
Glucose-Capillary: 235 mg/dL — ABNORMAL HIGH (ref 70–99)

## 2024-07-17 LAB — COMPREHENSIVE METABOLIC PANEL WITH GFR
ALT: 36 U/L (ref 0–44)
AST: 21 U/L (ref 15–41)
Albumin: 3.2 g/dL — ABNORMAL LOW (ref 3.5–5.0)
Alkaline Phosphatase: 82 U/L (ref 38–126)
Anion gap: 11 (ref 5–15)
BUN: 27 mg/dL — ABNORMAL HIGH (ref 8–23)
CO2: 23 mmol/L (ref 22–32)
Calcium: 9.1 mg/dL (ref 8.9–10.3)
Chloride: 111 mmol/L (ref 98–111)
Creatinine, Ser: 0.91 mg/dL (ref 0.44–1.00)
GFR, Estimated: >60 mL/min (ref >60)
Glucose, Bld: 247 mg/dL — ABNORMAL HIGH (ref 70–99)
Potassium: 4.2 mmol/L (ref 3.5–5.1)
Sodium: 144 mmol/L (ref 135–145)
Total Bilirubin: 0.5 mg/dL (ref 0.0–1.2)
Total Protein: 6.5 g/dL (ref 6.5–8.1)

## 2024-07-17 LAB — PHOSPHORUS: Phosphorus: 3.1 mg/dL (ref 2.5–4.6)

## 2024-07-17 LAB — MAGNESIUM: Magnesium: 2.3 mg/dL (ref 1.7–2.4)

## 2024-07-17 MED ORDER — FAMOTIDINE IN NACL 20-0.9 MG/50ML-% IV SOLN
20.0000 mg | Freq: Once | INTRAVENOUS | Status: AC
  Administered 2024-07-17: 20 mg via INTRAVENOUS
  Filled 2024-07-17: qty 50

## 2024-07-17 MED ORDER — GUAIFENESIN ER 600 MG PO TB12
600.0000 mg | ORAL_TABLET | Freq: Two times a day (BID) | ORAL | Status: DC
  Administered 2024-07-17: 600 mg via ORAL
  Filled 2024-07-17: qty 1

## 2024-07-17 MED ORDER — INSULIN ASPART 100 UNIT/ML IJ SOLN
0.0000 [IU] | Freq: Four times a day (QID) | INTRAMUSCULAR | Status: DC
  Administered 2024-07-17: 5 [IU] via SUBCUTANEOUS
  Administered 2024-07-18 (×2): 3 [IU] via SUBCUTANEOUS

## 2024-07-17 MED ORDER — TRAVASOL 10 % IV SOLN
INTRAVENOUS | Status: AC
  Filled 2024-07-17: qty 960

## 2024-07-17 NOTE — Progress Notes (Signed)
 [facility]  PROGRESS NOTE  Lauren Gray FMW:[id number] DOB: 01-30-1951 DOA: 07/04/2024 PCP: Paseda, Folashade R, FNP   Brief HPI:     73 y.o. female with medical history significant of HLD, prediabetes, CKDII does not take any medication on regular basis,  past surgical h/o S/P total hysterectomy and BSO with  presents with vomiting x 1 month, can only hold down crackers and yogurt, has not had a full bowel movement in a few weeks, last vomited this morning, last bm with small pebbles this morning, +flatus, no ab pain, + weight loss of 10lbs in the last months.  Found to have gastric outlet obstruction related to a mass.  Plan is for surgery at [facility] on 9/9-will need to be transferred over 2 to 3 days prior   Subjective   Lots of mucus production   Assessment/Plan:   Duodenal adenocarcinoma with gastric outlet obstruction CT scan shows dilated 1st and 2nd portion of duodenum with possible underlying mass. Patient was seen by gastroenterology and underwent small bowel enteroscopy.  The duodenal mass was biopsied.  Pathology is positive for adenocarcinoma. General surgery is following.  Patient has poor nutritional status.  Due to gastric outlet obstruction from the duodenal mass patient has been started on TPN.   Surgery is planned for 9/9 Due to persistent nausea and vomiting NG tube was inserted.   Chloraseptic spray and viscous lidocaine  can be used for mouth/throat discomfort.  Remains stable for the most part.   Hypokalemia/hypophosphatemia Continue to monitor and supplement as indicated.  Most of the supplementation is through TPN.     Essential hypertension Continue scheduled IV metoprolol .  Anxiety is contributing to elevated blood pressure readings.  Situational anxiety Ativan  as needed.  Hyperglycemia Was initially hypoglycemic.  Now noted to be hyperglycemic.  She is noted to be on every 4 hour coverage.  Also getting insulin  through TPN.   Long-acting insulin  has been discontinued this morning as insulin  dose through TPN is to be increased significantly.   HbA1c 6.1.  AKI on CKDII vs CKD IIIa Appears resolved  Mild elevated lipase Resolved  DVT prophylaxis: SCDs Code Status: Full code Family Communication: family at bedside Disposition: Plan is for surgery mid-September which will be done at [facility].  Disposition to be determined subsequently.    Medications    Chlorhexidine  Gluconate Cloth  6 each Topical Daily   enoxaparin  (LOVENOX ) injection  40 mg Subcutaneous Daily   fluticasone   2 spray Each Nare Daily   insulin  aspart  0-15 Units Subcutaneous Q6H   metoprolol  tartrate  5 mg Intravenous Q6H   pantoprazole  (PROTONIX ) IV  40 mg Intravenous Q12H    Objective   Vitals:   07/16/24 2037 07/16/24 2315 07/17/24 0625 07/17/24 1130  BP: (!) 180/69 (!) 161/59 (!) 162/55 (!) 165/61  Pulse: 86 93 89 82  Resp: 18 18    Temp: 98.2 F (36.8 C)  98.2 F (36.8 C)   TempSrc: Oral  Oral   SpO2: 97% 94% 94%   Weight:      Height:        Physical Examination:   General: Appearance:    Obese female in no acute distress     Lungs:     respirations unlabored  Heart:    Normal heart rate.    MS:   All extremities are intact.   Neurologic:   Awake, alert      Data Reviewed:  Basic Metabolic Panel: Recent Labs  Lab 07/11/24 0137 07/12/24 1335 07/13/24 0437 07/14/24 0310 07/15/24 0627 07/16/24 0129 07/17/24 0424  NA 142   < > 141 142 142 142 144  K 3.9   < > 4.2 4.2 4.3 4.2 4.2  CL 110   < > 109 110 110 109 111  CO2 22   < > 20* 20* 21* 21* 23  GLUCOSE 130*   < > 240* 250* 287* 347* 247*  BUN 17   < > 23 25* 27* 27* 27*  CREATININE 1.09*   < > 0.94 0.95 0.92 1.00 0.91  CALCIUM 9.0   < > 9.1 9.2 9.3 9.0 9.1  MG  --   --  2.3 2.3 2.5* 2.2 2.3  PHOS 3.3  --  2.4* 2.7 2.7  --  3.1   < > = values in this interval not displayed.    CBC: Recent Labs  Lab 07/15/24 0627  WBC 7.9  HGB 11.5*  HCT  37.6  MCV 96.2  PLT 226    LFT Recent Labs  Lab 07/14/24 0310 07/17/24 0424  AST 35 21  ALT 37 36  ALKPHOS 74 82  BILITOT 0.4 0.5  PROT 6.6 6.5  ALBUMIN 3.4* 3.2*     Belal Scallon U Mckenzy Salazar   [facility] If 7PM-7AM, please contact night-coverage at [email or web address], Office  [phone]   07/17/2024, 12:03 PM  LOS: 13 days

## 2024-07-17 NOTE — Progress Notes (Signed)
 PHARMACY - TOTAL PARENTERAL NUTRITION CONSULT NOTE   Indication: Gastric outlet obstruction 2nd to mass   Patient Measurements: Height: 5' 8.5 (174 cm) Weight: 115 kg (253 lb 8.5 oz) IBW/kg (Calculated) : 65.05 TPN AdjBW (KG): 76.1 Body mass index is 37.99 kg/m. Usual Weight:   Assessment: Pharmacy consulted to manage TPN for nutrition support in [name] with gastric outlet obstruction 2nd mass.  PMH: HLD, prediabetes, CKDII does not take any medication on regular basis, past surgical h/o S/P total hysterectomy and BSO   Glucose / Insulin : hx of prediabetes, not on medication PTA Lantus  5 units given 9/1 & 9/2> DC'd 9/2 9/2: 25 units insulin  added to TPN 9/3: changed from 12 hr to 18 hr cycle & insulin  increased to 40 units - on mSSI with schedule based on cyclic TPN - used  13 units/24 hrs CBGs (goal; <150): 203 (2 hours past cyclic TPN start);  ? (1 hour past cyclic TPN d/c); 235, (middle of TPN infusion); 104 - 151 (while off TPN)  Electrolytes:  all lytes WNL including CorrCa Renal: Scr stable,  BUN remains slightly elevated at 27 Hepatic: LFTs WNL with labs on 9/4 - albumin slightly low at 3.2 - 8/25 prealbumin 18 - low end of normal  - TG 99 (9/1) Intake / Output; MIVF: not on mIVF - I/O: +1155 mL - NG/OG output: 150 mL - UOP: 3 x documented  - last BM: 9/2 GI Imaging:  - 8/22 CT A/P: ? SBO from mass - 8/25: DG small bowel: severe stricture of transverse duodenum suspicious for mass & malignancy - 8/28 abd xray: Nonobstructive bowel gas pattern.  GI Surgeries / Procedures:  - 8/23 small bowel enteroscopy:  LA Grade B reflux esophagitis. Small hiatal hernia. Bilious gastric fluid. Duodenal mass. Biopsied  ++ pathology showed invasive adenocarcinoma++ Scheduled 9/9: whipple procedure Central access: PICC placed  8/26 for TPN TPN start date: 07/08/2024  Nutritional Goals: TPN was cycled per request by [facility] in hopes that pt would be discharged home before surgery> now  pt to remain in hospital until surgery TPN @ 80 ml/hr provides 96 gm protein &  [address] Assessment: Estimated Needs Total Energy Estimated Needs: 1700-1900 Total Protein Estimated Needs: 75-95 grams Total Fluid Estimated Needs: >/= 1.9 L   Current Nutrition:  NPO TPN - Thiamine  100 mg IV qday  8/26 - 8/31 - 8/25 Boost Breeze TID ( refused all doses)   Plan:  Today, at 1800:  - change from cyclic TPN over 18 hours to continuous infusion at 80 ml/hr to provide 100% of goals  - Electrolytes in TPN:  Na 50 mEq/L K 50 mEq/L Ca 5 mEq/L Mg 3 mEq/L Phos 20 mmol/L Cl:Ac 1:2 - Add standard MVI and trace elements to TPN - Increase insulin  in TPN to 50 units of insulin / bag  - continue mSSI q6hrs - mIVF per [facility] - none currently - Monitor TPN labs on Mon/Thurs  Rosaline IVAR Edison, Pharm.D Use secure chat for questions 07/17/2024 7:11 AM

## 2024-07-17 NOTE — Progress Notes (Signed)
    11 Days Post-Op  Subjective: CC: No abdominal pain, n/v. Passing flatus. No BM yesterday. NGT remains in place.   Objective: Vital signs in last 24 hours: Temp:  [98.2 F (36.8 C)-98.5 F (36.9 C)] 98.2 F (36.8 C) (09/04 0625) Pulse Rate:  [80-93] 89 (09/04 0625) Resp:  [16-18] 18 (09/03 2315) BP: (161-180)/(55-80) 162/55 (09/04 0625) SpO2:  [94 %-100 %] 94 % (09/04 0625) Last BM Date : 07/15/24  Intake/Output from previous day: 09/03 0701 - 09/04 0700 In: 1305.2 [I.V.:1305.2] Out: 150 [Emesis/NG output:150] Intake/Output this shift: No intake/output data recorded.  PE: Gen:  Alert, NAD, pleasant Abd: Soft, NT, NGT bilious   Lab Results:  Recent Labs    07/15/24 0627  WBC 7.9  HGB 11.5*  HCT 37.6  PLT 226   BMET Recent Labs    07/16/24 0129 07/17/24 0424  NA 142 144  K 4.2 4.2  CL 109 111  CO2 21* 23  GLUCOSE 347* 247*  BUN 27* 27*  CREATININE 1.00 0.91  CALCIUM 9.0 9.1   PT/INR No results for input(s): LABPROT, INR in the last 72 hours. CMP     Component Value Date/Time   NA 144 07/17/2024 0424   NA 142 06/18/2024 1445   K 4.2 07/17/2024 0424   CL 111 07/17/2024 0424   CO2 23 07/17/2024 0424   GLUCOSE 247 (H) 07/17/2024 0424   BUN 27 (H) 07/17/2024 0424   BUN 14 06/18/2024 1445   CREATININE 0.91 07/17/2024 0424   CREATININE 1.12 (H) 12/27/2021 0846   CALCIUM 9.1 07/17/2024 0424   PROT 6.5 07/17/2024 0424   PROT 7.2 06/18/2024 1445   ALBUMIN 3.2 (L) 07/17/2024 0424   ALBUMIN 4.3 06/18/2024 1445   AST 21 07/17/2024 0424   ALT 36 07/17/2024 0424   ALKPHOS 82 07/17/2024 0424   BILITOT 0.5 07/17/2024 0424   BILITOT 0.4 06/18/2024 1445   GFRNONAA >60 07/17/2024 0424   Lipase     Component Value Date/Time   LIPASE 34 07/06/2024 0441    Studies/Results: No results found.  Anti-infectives: Anti-infectives (From admission, onward)    None        Assessment/Plan Gastric outlet obstruction secondary to mass in D3,  invasive adenocarcinoma - S/p EGD with bx 8/24.  Path with invasive adenocarcinoma - PICC/TPN - Surgery appears to be scheduled for 9/9 at [facility].  We discussed that she may require her NGT in until then.  - Will need to tx to [facility] for surgery at some point    FEN - NPO x ice chips and few sips, NGT to LIWS, TPN VTE - SCDs, Lovenox  ID - None currently needed  I reviewed nursing notes, last 24 h vitals and pain scores, last 48 h intake and output, last 24 h labs and trends, and last 24 h imaging results.    LOS: 13 days    Lauren Gray, [facility] Surgery 07/17/2024, 8:12 AM Please see Amion for pager number during day hours 7:00am-4:30pm

## 2024-07-18 DIAGNOSIS — K56609 Unspecified intestinal obstruction, unspecified as to partial versus complete obstruction: Secondary | ICD-10-CM | POA: Diagnosis not present

## 2024-07-18 LAB — GLUCOSE, CAPILLARY
Glucose-Capillary: 142 mg/dL — ABNORMAL HIGH (ref 70–99)
Glucose-Capillary: 150 mg/dL — ABNORMAL HIGH (ref 70–99)
Glucose-Capillary: 181 mg/dL — ABNORMAL HIGH (ref 70–99)
Glucose-Capillary: 191 mg/dL — ABNORMAL HIGH (ref 70–99)

## 2024-07-18 MED ORDER — TRAVASOL 10 % IV SOLN
INTRAVENOUS | Status: AC
  Filled 2024-07-18: qty 936

## 2024-07-18 MED ORDER — INSULIN ASPART 100 UNIT/ML IJ SOLN
0.0000 [IU] | Freq: Four times a day (QID) | INTRAMUSCULAR | Status: DC
  Administered 2024-07-18: 4 [IU] via SUBCUTANEOUS
  Administered 2024-07-18 (×2): 3 [IU] via SUBCUTANEOUS
  Administered 2024-07-19 (×2): 4 [IU] via SUBCUTANEOUS
  Administered 2024-07-19 – 2024-07-20 (×3): 3 [IU] via SUBCUTANEOUS

## 2024-07-18 NOTE — Plan of Care (Signed)
  Problem: Health Behavior/Discharge Planning: Goal: Ability to manage health-related needs will improve Outcome: Progressing   Problem: Activity: Goal: Risk for activity intolerance will decrease Outcome: Progressing   Problem: Nutrition: Goal: Adequate nutrition will be maintained Outcome: Progressing   Problem: Coping: Goal: Level of anxiety will decrease Outcome: Progressing   Problem: Skin Integrity: Goal: Risk for impaired skin integrity will decrease Outcome: Progressing

## 2024-07-18 NOTE — Plan of Care (Signed)
 ?  Problem: Clinical Measurements: ?Goal: Ability to maintain clinical measurements within normal limits will improve ?Outcome: Progressing ?Goal: Will remain free from infection ?Outcome: Progressing ?Goal: Diagnostic test results will improve ?Outcome: Progressing ?  ?

## 2024-07-18 NOTE — Progress Notes (Signed)
 [facility]  PROGRESS NOTE  Lauren Gray FMW:[id number] DOB: 17-Oct-1951 DOA: 07/04/2024 PCP: Paseda, Folashade R, FNP   Brief HPI:     73 y.o. female with medical history significant of HLD, prediabetes, CKDII does not take any medication on regular basis,  past surgical h/o S/P total hysterectomy and BSO with  presents with vomiting x 1 month, can only hold down crackers and yogurt, has not had a full bowel movement in a few weeks, last vomited this morning, last bm with small pebbles this morning, +flatus, no ab pain, + weight loss of 10lbs in the last months.  Found to have gastric outlet obstruction related to a mass.  Plan is for surgery at [facility] on 9/9-will need to be transferred over 2 to 3 days prior- -plan to tx on 9/7 (Sunday)   Subjective   No SOB, still with mucous   Assessment/Plan:   Duodenal adenocarcinoma with gastric outlet obstruction CT scan shows dilated 1st and 2nd portion of duodenum with possible underlying mass. Patient was seen by gastroenterology and underwent small bowel enteroscopy.  The duodenal mass was biopsied.  Pathology is positive for adenocarcinoma. General surgery is following.  Patient has poor nutritional status.  Due to gastric outlet obstruction from the duodenal mass patient has been started on TPN.   Surgery is planned for 9/9 Due to persistent nausea and vomiting NG tube was inserted.   Chloraseptic spray and viscous lidocaine  can be used for mouth/throat discomfort.  Remains stable for the most part.   Hypokalemia/hypophosphatemia Continue to monitor and supplement as indicated.  Most of the supplementation is through TPN.     Essential hypertension Continue scheduled IV metoprolol .  Anxiety is contributing to elevated blood pressure readings.  Situational anxiety Ativan  as needed.  Hyperglycemia Was initially hypoglycemic.  Now noted to be hyperglycemic.  She is noted to be on every 4 hour coverage.  Also getting  insulin  through TPN.  Long-acting insulin  has been discontinued this morning as insulin  dose through TPN is to be increased significantly.   HbA1c 6.1.  AKI on CKDII vs CKD IIIa Appears resolved  Mild elevated lipase Resolved  DVT prophylaxis: SCDs Code Status: Full code Family Communication: family at bedside Disposition: Plan is for surgery mid-September which will be done at [facility].  Disposition to be determined subsequently.    Medications    Chlorhexidine  Gluconate Cloth  6 each Topical Daily   enoxaparin  (LOVENOX ) injection  40 mg Subcutaneous Daily   fluticasone   2 spray Each Nare Daily   insulin  aspart  0-20 Units Subcutaneous Q6H   metoprolol  tartrate  5 mg Intravenous Q6H   pantoprazole  (PROTONIX ) IV  40 mg Intravenous Q12H    Objective   Vitals:   07/17/24 1800 07/17/24 2014 07/18/24 0500 07/18/24 0610  BP: (!) 169/58 (!) 157/71  (!) 149/60  Pulse: 89 85  67  Resp:  18  18  Temp:  98.5 F (36.9 C)  98.3 F (36.8 C)  TempSrc:  Oral  Oral  SpO2:  97%  98%  Weight:   114 kg   Height:        Physical Examination:    General: Appearance:    Obese female in no acute distress     Lungs:     respirations unlabored  Heart:    Normal heart rate. .   MS:   All extremities are intact.   Neurologic:   Awake, alert  Data Reviewed:  Basic Metabolic Panel: Recent Labs  Lab 07/13/24 0437 07/14/24 0310 07/15/24 0627 07/16/24 0129 07/17/24 0424  NA 141 142 142 142 144  K 4.2 4.2 4.3 4.2 4.2  CL 109 110 110 109 111  CO2 20* 20* 21* 21* 23  GLUCOSE 240* 250* 287* 347* 247*  BUN 23 25* 27* 27* 27*  CREATININE 0.94 0.95 0.92 1.00 0.91  CALCIUM 9.1 9.2 9.3 9.0 9.1  MG 2.3 2.3 2.5* 2.2 2.3  PHOS 2.4* 2.7 2.7  --  3.1    CBC: Recent Labs  Lab 07/15/24 0627  WBC 7.9  HGB 11.5*  HCT 37.6  MCV 96.2  PLT 226    LFT Recent Labs  Lab 07/14/24 0310 07/17/24 0424  AST 35 21  ALT 37 36  ALKPHOS 74 82  BILITOT 0.4 0.5  PROT 6.6 6.5   ALBUMIN 3.4* 3.2*     Ivett Luebbe U Aariz Maish   [facility] If 7PM-7AM, please contact night-coverage at [email or web address], Office  [phone]   07/18/2024, 10:25 AM  LOS: 14 days

## 2024-07-18 NOTE — Progress Notes (Signed)
 PHARMACY - TOTAL PARENTERAL NUTRITION CONSULT NOTE   Indication: Gastric outlet obstruction 2nd to mass   Patient Measurements: Height: 5' 8.5 (174 cm) Weight: 114 kg (251 lb 5.2 oz) IBW/kg (Calculated) : 65.05 TPN AdjBW (KG): 76.1 Body mass index is 37.66 kg/m. Usual Weight:   Assessment: Pharmacy consulted to manage TPN for nutrition support in [name] with gastric outlet obstruction 2nd mass.  PMH: HLD, prediabetes, CKDII does not take any medication on regular basis, past surgical h/o S/P total hysterectomy and BSO   Glucose / Insulin : hx of prediabetes (A1c 6.1 in Aug 2025), no DM medication PTA - had to transition cyclic TPN back to continuous d/t uncontrolled CBGs - CBGs improved on continuous TPN, but remain slightly elevated (150-200 range) - 11 units SSI used yesterday (+50 units insulin  R in TPN) Electrolytes: all lytes stable WNL Renal: SCr stable WNL; BUN slightly elevated but stable; UOP not charted Hepatic: LFTs, Tbili stable WNL - albumin slightly low at 3.2 - TG WNL (9/1) I/O: - NG OP improved over the past week; down to ~350 ml/d - LBM: 9/2 GI Imaging:  - 8/22 CT A/P: ? SBO from mass - 8/25: DG small bowel: severe stricture of transverse duodenum suspicious for mass & malignancy - 8/28 AXR: Nonobstructive bowel gas pattern.  GI Surgeries / Procedures:  - 8/23 small bowel enteroscopy: esophagitis; duodenal mass biopsied  ++ pathology showed invasive adenocarcinoma++ Scheduled 9/9: whipple procedure Central access: PICC placed  8/26 for TPN TPN start date: 07/08/2024  Nutritional Goals: RD Assessment: Estimated Needs Total Energy Estimated Needs: 1700-1900 Total Protein Estimated Needs: 75-95 grams Total Fluid Estimated Needs: >/= 1.9 L   TPN @ 75 ml/hr provides 94 g protein & 1832 kcals daily  Current Nutrition:  NPO TPN  **TPN was cycled per request by [facility] in hopes that pt would be discharged home before surgery, however pt now to remain in  hospital until surgery**  Plan:  Today, at 1800:  Continue TPN at 75 ml/hr Electrolytes in TPN: no changes Na 50 mEq/L K 50 mEq/L Ca 5 mEq/L Mg 3 mEq/L Phos 20 mmol/L Cl:Ac 1:2 Add standard MVI and trace elements to TPN Chromium withheld d/t critical shortage Continue 50 units regular insulin  in TPN bag Advance SSI to resistant-scale with q6 hr CBG checks mIVF per [facility] - none currently Monitor TPN labs on Mon/Thurs  Bard Jeans, PharmD, BCPS [phone] 07/18/2024, 9:17 AM

## 2024-07-18 NOTE — Progress Notes (Signed)
    12 Days Post-Op  Subjective: CC: No abdominal pain, n/v. Passing flatus. No BM yesterday. NGT remains in place.   Objective: Vital signs in last 24 hours: Temp:  [98.2 F (36.8 C)-98.5 F (36.9 C)] 98.3 F (36.8 C) (09/05 0610) Pulse Rate:  [67-89] 67 (09/05 0610) Resp:  [16-18] 18 (09/05 0610) BP: (149-169)/(58-71) 149/60 (09/05 0610) SpO2:  [97 %-98 %] 98 % (09/05 0610) Weight:  [885 kg] 114 kg (09/05 0500) Last BM Date : 07/15/24  Intake/Output from previous day: 09/04 0701 - 09/05 0700 In: 1607.9 [I.V.:1557.9; IV Piggyback:50] Out: 350 [Emesis/NG output:350] Intake/Output this shift: No intake/output data recorded.  PE: Gen:  Alert, NAD, pleasant Abd: Soft, NT, NGT bilious   Lab Results:  No results for input(s): WBC, HGB, HCT, PLT in the last 72 hours.  BMET Recent Labs    07/16/24 0129 07/17/24 0424  NA 142 144  K 4.2 4.2  CL 109 111  CO2 21* 23  GLUCOSE 347* 247*  BUN 27* 27*  CREATININE 1.00 0.91  CALCIUM 9.0 9.1   PT/INR No results for input(s): LABPROT, INR in the last 72 hours. CMP     Component Value Date/Time   NA 144 07/17/2024 0424   NA 142 06/18/2024 1445   K 4.2 07/17/2024 0424   CL 111 07/17/2024 0424   CO2 23 07/17/2024 0424   GLUCOSE 247 (H) 07/17/2024 0424   BUN 27 (H) 07/17/2024 0424   BUN 14 06/18/2024 1445   CREATININE 0.91 07/17/2024 0424   CREATININE 1.12 (H) 12/27/2021 0846   CALCIUM 9.1 07/17/2024 0424   PROT 6.5 07/17/2024 0424   PROT 7.2 06/18/2024 1445   ALBUMIN 3.2 (L) 07/17/2024 0424   ALBUMIN 4.3 06/18/2024 1445   AST 21 07/17/2024 0424   ALT 36 07/17/2024 0424   ALKPHOS 82 07/17/2024 0424   BILITOT 0.5 07/17/2024 0424   BILITOT 0.4 06/18/2024 1445   GFRNONAA >60 07/17/2024 0424   Lipase     Component Value Date/Time   LIPASE 34 07/06/2024 0441    Studies/Results: No results found.  Anti-infectives: Anti-infectives (From admission, onward)    None         Assessment/Plan Gastric outlet obstruction secondary to mass in D3, invasive adenocarcinoma - S/p EGD with bx 8/24.  Path with invasive adenocarcinoma - PICC/TPN - Surgery appears to be scheduled for 9/9 at [facility].  We discussed that she may require her NGT in until then.  - Plan transfer to [facility] for surgery on Monday, 9/8. Discussed with [facility] who is arranging this.    FEN - NPO x ice chips and few sips, NGT to LIWS, TPN VTE - SCDs, Lovenox  ID - None currently needed  I reviewed nursing notes, last 24 h vitals and pain scores, last 48 h intake and output, last 24 h labs and trends, and last 24 h imaging results.    LOS: 14 days    Ozell CHRISTELLA Shaper, [facility] Surgery 07/18/2024, 9:58 AM Please see Amion for pager number during day hours 7:00am-4:30pm

## 2024-07-19 DIAGNOSIS — K56609 Unspecified intestinal obstruction, unspecified as to partial versus complete obstruction: Secondary | ICD-10-CM | POA: Diagnosis not present

## 2024-07-19 LAB — GLUCOSE, CAPILLARY
Glucose-Capillary: 135 mg/dL — ABNORMAL HIGH (ref 70–99)
Glucose-Capillary: 149 mg/dL — ABNORMAL HIGH (ref 70–99)
Glucose-Capillary: 153 mg/dL — ABNORMAL HIGH (ref 70–99)
Glucose-Capillary: 158 mg/dL — ABNORMAL HIGH (ref 70–99)

## 2024-07-19 MED ORDER — TRAVASOL 10 % IV SOLN
INTRAVENOUS | Status: AC
  Filled 2024-07-19: qty 936

## 2024-07-19 MED ORDER — ACETAMINOPHEN 10 MG/ML IV SOLN
1000.0000 mg | Freq: Four times a day (QID) | INTRAVENOUS | Status: AC | PRN
  Administered 2024-07-19: 1000 mg via INTRAVENOUS
  Filled 2024-07-19: qty 100

## 2024-07-19 MED ORDER — ACETAMINOPHEN 10 MG/ML IV SOLN: 1000.0000 mg | Freq: Four times a day (QID) | INTRAVENOUS | Status: DC

## 2024-07-19 NOTE — Progress Notes (Signed)
    13 Days Post-Op  Subjective: CC: No abdominal pain, n/v. Passing flatus. No BM yesterday. NGT remains in place.   Objective: Vital signs in last 24 hours: Temp:  [97.7 F (36.5 C)-98.7 F (37.1 C)] 98.7 F (37.1 C) (09/06 0500) Pulse Rate:  [72-76] 76 (09/06 0500) Resp:  [16] 16 (09/06 0500) BP: (144-173)/(55-76) 148/55 (09/06 0500) SpO2:  [97 %-100 %] 97 % (09/06 0500) Weight:  [112.2 kg] 112.2 kg (09/06 0600) Last BM Date : 07/15/24  Intake/Output from previous day: 09/05 0701 - 09/06 0700 In: 1846.7 [I.V.:1846.7] Out: 850 [Emesis/NG output:850] Intake/Output this shift: No intake/output data recorded.  PE: Gen:  Alert, NAD, pleasant Abd: Soft, NT, NGT bilious   Lab Results:  No results for input(s): WBC, HGB, HCT, PLT in the last 72 hours.  BMET Recent Labs    07/17/24 0424  NA 144  K 4.2  CL 111  CO2 23  GLUCOSE 247*  BUN 27*  CREATININE 0.91  CALCIUM  9.1   PT/INR No results for input(s): LABPROT, INR in the last 72 hours. CMP     Component Value Date/Time   NA 144 07/17/2024 0424   NA 142 06/18/2024 1445   K 4.2 07/17/2024 0424   CL 111 07/17/2024 0424   CO2 23 07/17/2024 0424   GLUCOSE 247 (H) 07/17/2024 0424   BUN 27 (H) 07/17/2024 0424   BUN 14 06/18/2024 1445   CREATININE 0.91 07/17/2024 0424   CREATININE 1.12 (H) 12/27/2021 0846   CALCIUM  9.1 07/17/2024 0424   PROT 6.5 07/17/2024 0424   PROT 7.2 06/18/2024 1445   ALBUMIN  3.2 (L) 07/17/2024 0424   ALBUMIN  4.3 06/18/2024 1445   AST 21 07/17/2024 0424   ALT 36 07/17/2024 0424   ALKPHOS 82 07/17/2024 0424   BILITOT 0.5 07/17/2024 0424   BILITOT 0.4 06/18/2024 1445   GFRNONAA >60 07/17/2024 0424   Lipase     Component Value Date/Time   LIPASE 34 07/06/2024 0441    Studies/Results: No results found.  Anti-infectives: Anti-infectives (From admission, onward)    None        Assessment/Plan Gastric outlet obstruction secondary to mass in D3, invasive  adenocarcinoma  - S/p EGD with bx 8/24.  Path with invasive adenocarcinoma - PICC/TPN - Surgery scheduled for 9/9 at [facility] with Dr. Aron.  We discussed that she may require her NGT in until then.  - Plan transfer to [facility] for surgery on Monday, 9/8. This has bene discussed with [facility] 9/5 who is arranging this.    FEN - NPO x ice chips and few sips, NGT to LIWS, TPN VTE - SCDs, Lovenox  ID - None currently needed  I spent a total of 35 minutes today in both face-to-face and non-face-to-face activities to perform the following: review records, take and update history, examine the patient, counsel the patient on the diagnosis, and document encounter, findings, and plan in the EHR  for this visit on the date of this encounter.   I reviewed nursing notes, last 24 h vitals and pain scores, last 48 h intake and output, last 24 h labs and trends, and last 24 h imaging results.    LOS: 15 days   Lonni Pizza, MD [facility] Surgery, A [facility]

## 2024-07-19 NOTE — Progress Notes (Signed)
 PHARMACY - TOTAL PARENTERAL NUTRITION CONSULT NOTE   Indication: Gastric outlet obstruction 2nd to mass   Patient Measurements: Height: 5' 8.5 (174 cm) Weight: 112.2 kg (247 lb 5.7 oz) IBW/kg (Calculated) : 65.05 TPN AdjBW (KG): 76.1 Body mass index is 37.06 kg/m. Usual Weight:   Assessment: Pharmacy consulted to manage TPN for nutrition support in [name] with gastric outlet obstruction 2nd mass.  PMH: HLD, prediabetes, CKDII does not take any medication on regular basis, past surgical h/o S/P total hysterectomy and BSO   **Last labs 07/17/24**  Glucose / Insulin : hx of prediabetes (A1c 6.1 in Aug 2025), no DM medication PTA - had to transition cyclic TPN back to continuous d/t uncontrolled CBGs - CBGs still elevated on continuous TPN, but improved from 180-190s to ~150 yesterday (goal 100-150) - 14 units rSSI used yesterday (+50 units insulin  R in TPN) Electrolytes: all lytes stable WNL Renal: SCr stable WNL; BUN slightly elevated but stable; UOP not charted Hepatic: LFTs, Tbili stable WNL - albumin  slightly low at 3.2 - TG WNL (9/1) I/O: - NG OP more than doubled yesterday; 300s >> 800 mL/d - LBM: 9/2 GI Imaging:  - 8/22 CT A/P: SBO from mass - 8/25: AXR: duodenal stricture suspicious for mass & malignancy - 8/28 AXR: Nonobstructive bowel gas pattern.  GI Surgeries / Procedures:  - 8/23 SB enteroscopy: esophagitis; duodenal mass pathology shows invasive adenocarcinoma Scheduled 9/9: whipple procedure Central access: PICC placed  8/26 for TPN TPN start date: 07/08/2024  Nutritional Goals: RD Assessment: Estimated Needs Total Energy Estimated Needs: 1700-1900 Total Protein Estimated Needs: 75-95 grams Total Fluid Estimated Needs: >/= 1.9 L   TPN @ 75 ml/hr provides 94 g protein & 1832 kcals daily  Current Nutrition:  NPO TPN  **TPN was cycled per request by [facility] in hopes that pt would be discharged home before surgery, however pt now to remain in hospital until  surgery**  Plan:  Today, at 1800:  Continue TPN at 75 ml/hr Electrolytes in TPN: no changes Na 50 mEq/L K 50 mEq/L Ca 5 mEq/L Mg 3 mEq/L Phos 20 mmol/L Cl:Ac 1:2 Add standard MVI and trace elements to TPN Chromium withheld d/t critical shortage Increase to 55 units regular insulin  in TPN bag Advance SSI to resistant-scale with q6 hr CBG checks mIVF per [facility] - none currently Monitor TPN labs on Mon/Thurs  Bard Jeans, PharmD, BCPS [phone] 07/19/2024, 10:29 AM

## 2024-07-19 NOTE — Plan of Care (Signed)
   Problem: Coping: Goal: Level of anxiety will decrease Outcome: Progressing   Problem: Pain Managment: Goal: General experience of comfort will improve and/or be controlled Outcome: Progressing

## 2024-07-19 NOTE — Plan of Care (Signed)
  Problem: Activity: Goal: Risk for activity intolerance will decrease Outcome: Progressing   Problem: Coping: Goal: Level of anxiety will decrease Outcome: Progressing   Problem: Elimination: Goal: Will not experience complications related to bowel motility Outcome: Progressing   Problem: Pain Managment: Goal: General experience of comfort will improve and/or be controlled Outcome: Progressing

## 2024-07-19 NOTE — Progress Notes (Signed)
 [facility]  PROGRESS NOTE  Lauren Gray FMW:[id number] DOB: 03/27/1951 DOA: 07/04/2024 PCP: Paseda, Folashade R, FNP   Brief HPI:     73 y.o. female with medical history significant of HLD, prediabetes, CKDII does not take any medication on regular basis,  past surgical h/o S/P total hysterectomy and BSO with  presents with vomiting x 1 month, can only hold down crackers and yogurt, has not had a full bowel movement in a few weeks, last vomited this morning, last bm with small pebbles this morning, +flatus, no ab pain, + weight loss of 10lbs in the last months.  Found to have gastric outlet obstruction related to a mass.  Plan is for surgery at [facility] on 9/9-will need to be transferred over 2 to 3 days prior- -plan to tx on 9/7 (Sunday)   Subjective   Not feeling well this AM   Assessment/Plan:   Duodenal adenocarcinoma with gastric outlet obstruction CT scan shows dilated 1st and 2nd portion of duodenum with possible underlying mass. Patient was seen by gastroenterology and underwent small bowel enteroscopy.  The duodenal mass was biopsied.  Pathology is positive for adenocarcinoma. General surgery is following.  Patient has poor nutritional status.  Due to gastric outlet obstruction from the duodenal mass patient has been started on TPN.   Surgery is planned for 9/9 Due to persistent nausea and vomiting NG tube was inserted.   Chloraseptic spray and viscous lidocaine  can be used for mouth/throat discomfort.  Remains stable for the most part.   Hypokalemia/hypophosphatemia Continue to monitor and supplement as indicated.  Most of the supplementation is through TPN.     Essential hypertension Continue scheduled IV metoprolol .  Anxiety is contributing to elevated blood pressure readings.  Situational anxiety Ativan  as needed.  Hyperglycemia Was initially hypoglycemic.  Now noted to be hyperglycemic.  She is noted to be on every 4 hour coverage.  Also getting  insulin  through TPN.  Long-acting insulin  has been discontinued this morning as insulin  dose through TPN is to be increased significantly.   HbA1c 6.1.  AKI on CKDII vs CKD IIIa Appears resolved  Mild elevated lipase Resolved  DVT prophylaxis: SCDs Code Status: Full code Family Communication: family at bedside Disposition: Plan is for surgery mid-September which will be done at [facility].  Disposition to be determined subsequently.    Medications    Chlorhexidine  Gluconate Cloth  6 each Topical Daily   enoxaparin  (LOVENOX ) injection  40 mg Subcutaneous Daily   fluticasone   2 spray Each Nare Daily   insulin  aspart  0-20 Units Subcutaneous Q6H   metoprolol  tartrate  5 mg Intravenous Q6H   pantoprazole  (PROTONIX ) IV  40 mg Intravenous Q12H    Objective   Vitals:   07/18/24 1411 07/18/24 2140 07/19/24 0500 07/19/24 0600  BP: (!) 173/76 (!) 144/60 (!) 148/55   Pulse: 74 72 76   Resp: 16 16 16    Temp: 97.7 F (36.5 C) 98.7 F (37.1 C) 98.7 F (37.1 C)   TempSrc: Oral Oral Oral   SpO2: 100% 98% 97%   Weight:    112.2 kg  Height:        Physical Examination:    General: Appearance:    Obese female in no acute distress     Lungs:     respirations unlabored  Heart:    Normal heart rate. .   MS:   All extremities are intact.   Neurologic:   Awake, alert  Data Reviewed:  Basic Metabolic Panel: Recent Labs  Lab 07/13/24 0437 07/14/24 0310 07/15/24 0627 07/16/24 0129 07/17/24 0424  NA 141 142 142 142 144  K 4.2 4.2 4.3 4.2 4.2  CL 109 110 110 109 111  CO2 20* 20* 21* 21* 23  GLUCOSE 240* 250* 287* 347* 247*  BUN 23 25* 27* 27* 27*  CREATININE 0.94 0.95 0.92 1.00 0.91  CALCIUM  9.1 9.2 9.3 9.0 9.1  MG 2.3 2.3 2.5* 2.2 2.3  PHOS 2.4* 2.7 2.7  --  3.1    CBC: Recent Labs  Lab 07/15/24 0627  WBC 7.9  HGB 11.5*  HCT 37.6  MCV 96.2  PLT 226    LFT Recent Labs  Lab 07/14/24 0310 07/17/24 0424  AST 35 21  ALT 37 36  ALKPHOS 74 82  BILITOT  0.4 0.5  PROT 6.6 6.5  ALBUMIN  3.4* 3.2*     Lauren Gray Lauren Gray   [facility] If 7PM-7AM, please contact night-coverage at [email or web address], Office  [phone]   07/19/2024, 12:07 PM  LOS: 15 days

## 2024-07-20 DIAGNOSIS — K56609 Unspecified intestinal obstruction, unspecified as to partial versus complete obstruction: Secondary | ICD-10-CM | POA: Diagnosis not present

## 2024-07-20 LAB — CBC
HCT: 34.8 % — ABNORMAL LOW (ref 36.0–46.0)
Hemoglobin: 10.8 g/dL — ABNORMAL LOW (ref 12.0–15.0)
MCH: 29.8 pg (ref 26.0–34.0)
MCHC: 31 g/dL (ref 30.0–36.0)
MCV: 96.1 fL (ref 80.0–100.0)
Platelets: 224 K/uL (ref 150–400)
RBC: 3.62 MIL/uL — ABNORMAL LOW (ref 3.87–5.11)
RDW: 12.6 % (ref 11.5–15.5)
WBC: 6.1 K/uL (ref 4.0–10.5)
nRBC: 0 % (ref 0.0–0.2)

## 2024-07-20 LAB — GLUCOSE, CAPILLARY
Glucose-Capillary: 142 mg/dL — ABNORMAL HIGH (ref 70–99)
Glucose-Capillary: 151 mg/dL — ABNORMAL HIGH (ref 70–99)
Glucose-Capillary: 154 mg/dL — ABNORMAL HIGH (ref 70–99)
Glucose-Capillary: 139 mg/dL — ABNORMAL HIGH (ref 70–99)

## 2024-07-20 LAB — BASIC METABOLIC PANEL WITH GFR
Anion gap: 10 (ref 5–15)
BUN: 24 mg/dL — ABNORMAL HIGH (ref 8–23)
CO2: 23 mmol/L (ref 22–32)
Calcium: 8.7 mg/dL — ABNORMAL LOW (ref 8.9–10.3)
Chloride: 110 mmol/L (ref 98–111)
Creatinine, Ser: 0.91 mg/dL (ref 0.44–1.00)
GFR, Estimated: >60 mL/min (ref >60)
Glucose, Bld: 145 mg/dL — ABNORMAL HIGH (ref 70–99)
Potassium: 3.8 mmol/L (ref 3.5–5.1)
Sodium: 143 mmol/L (ref 135–145)

## 2024-07-20 MED ORDER — TRAVASOL 10 % IV SOLN
INTRAVENOUS | Status: AC
  Filled 2024-07-20: qty 936

## 2024-07-20 MED ORDER — INSULIN ASPART 100 UNIT/ML IJ SOLN
0.0000 [IU] | Freq: Three times a day (TID) | INTRAMUSCULAR | Status: DC
  Administered 2024-07-20: 4 [IU] via SUBCUTANEOUS
  Administered 2024-07-20 – 2024-07-21 (×4): 3 [IU] via SUBCUTANEOUS
  Administered 2024-07-22: 15 [IU] via SUBCUTANEOUS

## 2024-07-20 NOTE — Progress Notes (Signed)
 Patient not formally seen today, however, plans for transfer to [facility] tomorrow for planned surgery with Dr. Aron Tuesday 07/22/24.  Continue NG to LIWS/NPO Continue nutrition support - TPN at present  PPX: SCDs, lovenox    Lonni Pizza, MD [facility], [facility]

## 2024-07-20 NOTE — Progress Notes (Addendum)
 PHARMACY - TOTAL PARENTERAL NUTRITION CONSULT NOTE   Indication: Gastric outlet obstruction 2nd to mass   Patient Measurements: Height: 5' 8.5 (174 cm) Weight: 111.6 kg (246 lb 0.5 oz) IBW/kg (Calculated) : 65.05 TPN AdjBW (KG): 76.1 Body mass index is 36.87 kg/m. Usual Weight:   Assessment: Pharmacy consulted to manage TPN for nutrition support in [name] with gastric outlet obstruction 2nd mass.  PMH: HLD, prediabetes, CKDII does not take any medication on regular basis, past surgical h/o S/P total hysterectomy and BSO   Glucose / Insulin : hx of prediabetes (A1c 6.1 in Aug 2025), no DM medication PTA - had to transition cyclic TPN back to continuous d/t uncontrolled CBGs - CBGs now within goal (100-150) on continuous TPN after increasing insulin  in TPN and  - 14 units rSSI used yesterday (+50 units insulin  R in TPN) Electrolytes: all lytes stable WNL Renal: SCr stable WNL; BUN slightly elevated but stable; UOP not charted Hepatic: LFTs, Tbili stable WNL - albumin  slightly low at 3.2 - TG WNL (9/1) I/O: - NG OP significantly elevated 9/5; much improved yesterday (150 ml) - LBM: 9/2 - mIVF: none GI Imaging:  - 8/22 CT A/P: SBO from mass - 8/25: AXR: duodenal stricture suspicious for mass & malignancy - 8/28 AXR: Nonobstructive bowel gas pattern.  GI Surgeries / Procedures:  - 8/23 SB enteroscopy: esophagitis; duodenal mass pathology shows invasive adenocarcinoma Scheduled 9/9: whipple procedure Central access: PICC placed  8/26 for TPN TPN start date: 07/08/2024  Nutritional Goals: RD Assessment: Estimated Needs Total Energy Estimated Needs: 1700-1900 Total Protein Estimated Needs: 75-95 grams Total Fluid Estimated Needs: >/= 1.9 L   TPN @ 75 ml/hr provides 94 g protein & 1832 kcals daily  Current Nutrition:  NPO TPN  **TPN was cycled per request by [facility] in hopes that pt would be discharged home before surgery, however pt now to remain in hospital until  surgery**  Plan:  Today, at 1800:  Continue TPN at 75 ml/hr Electrolytes in TPN: no changes Na 50 mEq/L K 50 mEq/L Ca 5 mEq/L Mg 3 mEq/L Phos 20 mmol/L Cl:Ac 1:2 Add standard MVI and trace elements to TPN Chromium withheld d/t critical shortage Increase to 60 units regular insulin  in TPN bag Continue resistant-scale SSI; can reduce to q8 hr CBG checks to minimize fingersticks mIVF per [facility] - none currently Monitor TPN labs on Mon/Thurs  Bard Jeans, PharmD, BCPS [phone] 07/20/2024, 9:06 AM

## 2024-07-20 NOTE — Plan of Care (Signed)
   Problem: Clinical Measurements: Goal: Ability to maintain clinical measurements within normal limits will improve Outcome: Progressing Goal: Will remain free from infection Outcome: Progressing

## 2024-07-20 NOTE — Progress Notes (Addendum)
 Patient arrived to the floor at 1430 patient was very upset about her room, the size, the location and how the room looked, nursing staff moved patient to another room and called to get patient bed replaced because of some cracks that where in the footboard that the patient wasn't happy about. Patient in bed with eyes open, even unlabored respirations, skin warm and dry to the touch, call light within reach with safety measures in place.

## 2024-07-20 NOTE — Progress Notes (Signed)
 [facility]  PROGRESS NOTE  Lauren Gray FMW:[id number] DOB: March 01, 1951 DOA: 07/04/2024 PCP: Paseda, Folashade R, FNP   Brief HPI:     73 y.o. female with medical history significant of HLD, prediabetes, CKDII does not take any medication on regular basis,  past surgical h/o S/P total hysterectomy and BSO with  presents with vomiting x 1 month, can only hold down crackers and yogurt, has not had a full bowel movement in a few weeks, last vomited this morning, last bm with small pebbles this morning, +flatus, no ab pain, + weight loss of 10lbs in the last months.  Found to have gastric outlet obstruction related to a mass.  Plan is for surgery at [facility] on 9/9-will need to be transferred over 2 to 3 days prior- -plan to tx on 9/7 (Sunday)   Subjective   Less sinus pressure today   Assessment/Plan:   Duodenal adenocarcinoma with gastric outlet obstruction CT scan shows dilated 1st and 2nd portion of duodenum with possible underlying mass. Patient was seen by gastroenterology and underwent small bowel enteroscopy.  The duodenal mass was biopsied.  Pathology is positive for adenocarcinoma. General surgery is following.  Patient has poor nutritional status.  Due to gastric outlet obstruction from the duodenal mass patient has been started on TPN.   Surgery is planned for 9/9 Due to persistent nausea and vomiting NG tube was inserted.   Chloraseptic spray and viscous lidocaine  can be used for mouth/throat discomfort.  Remains stable for the most part.   Hypokalemia/hypophosphatemia Continue to monitor and supplement as indicated.  Most of the supplementation is through TPN.     Essential hypertension Continue scheduled IV metoprolol .  Anxiety is contributing to elevated blood pressure readings.  Situational anxiety Ativan  as needed.  Hyperglycemia Was initially hypoglycemic.  Now noted to be hyperglycemic.  She is noted to be on every 4 hour coverage.    AKI on  CKDII vs CKD IIIa Appears resolved  Mild elevated lipase Resolved  DVT prophylaxis: SCDs Code Status: Full code Family Communication: family at bedside Disposition: Plan is for surgery 9/9 which will be done at [facility].  Disposition to be determined subsequently.    Medications    Chlorhexidine  Gluconate Cloth  6 each Topical Daily   enoxaparin  (LOVENOX ) injection  40 mg Subcutaneous Daily   fluticasone   2 spray Each Nare Daily   insulin  aspart  0-20 Units Subcutaneous Q8H   metoprolol  tartrate  5 mg Intravenous Q6H   pantoprazole  (PROTONIX ) IV  40 mg Intravenous Q12H    Objective   Vitals:   07/19/24 1803 07/19/24 2026 07/20/24 0500 07/20/24 0509  BP: (!) 147/68 (!) 154/61  (!) 156/56  Pulse: 70 73  69  Resp: 16 16  16   Temp: 98.4 F (36.9 C) 98.4 F (36.9 C)  98 F (36.7 C)  TempSrc:  Oral  Oral  SpO2: 99% 96%  98%  Weight:   111.6 kg   Height:        Physical Examination:    General: Appearance:    Obese female in no acute distress     Lungs:     respirations unlabored  Heart:    Normal heart rate. .   MS:   All extremities are intact.   Neurologic:   Awake, alert      Data Reviewed:  Basic Metabolic Panel: Recent Labs  Lab 07/14/24 0310 07/15/24 0627 07/16/24 0129 07/17/24 0424 07/20/24 0326  NA 142 142  142 144 143  K 4.2 4.3 4.2 4.2 3.8  CL 110 110 109 111 110  CO2 20* 21* 21* 23 23  GLUCOSE 250* 287* 347* 247* 145*  BUN 25* 27* 27* 27* 24*  CREATININE 0.95 0.92 1.00 0.91 0.91  CALCIUM  9.2 9.3 9.0 9.1 8.7*  MG 2.3 2.5* 2.2 2.3  --   PHOS 2.7 2.7  --  3.1  --     CBC: Recent Labs  Lab 07/15/24 0627 07/20/24 0326  WBC 7.9 6.1  HGB 11.5* 10.8*  HCT 37.6 34.8*  MCV 96.2 96.1  PLT 226 224    LFT Recent Labs  Lab 07/14/24 0310 07/17/24 0424  AST 35 21  ALT 37 36  ALKPHOS 74 82  BILITOT 0.4 0.5  PROT 6.6 6.5  ALBUMIN  3.4* 3.2*     Zamyra Allensworth U Dominique Ressel   [facility] If 7PM-7AM, please contact night-coverage at  [email or web address], Office  [phone]   07/20/2024, 11:09 AM  LOS: 16 days

## 2024-07-21 DIAGNOSIS — K56609 Unspecified intestinal obstruction, unspecified as to partial versus complete obstruction: Secondary | ICD-10-CM | POA: Diagnosis not present

## 2024-07-21 LAB — PREPARE RBC (CROSSMATCH)

## 2024-07-21 LAB — TRIGLYCERIDES: Triglycerides: 71 mg/dL (ref <150)

## 2024-07-21 LAB — MAGNESIUM: Magnesium: 1.8 mg/dL (ref 1.7–2.4)

## 2024-07-21 LAB — COMPREHENSIVE METABOLIC PANEL WITH GFR
ALT: 37 U/L (ref 0–44)
AST: 22 U/L (ref 15–41)
Albumin: 2.3 g/dL — ABNORMAL LOW (ref 3.5–5.0)
Alkaline Phosphatase: 82 U/L (ref 38–126)
Anion gap: 5 (ref 5–15)
BUN: 23 mg/dL (ref 8–23)
CO2: 24 mmol/L (ref 22–32)
Calcium: 8.5 mg/dL — ABNORMAL LOW (ref 8.9–10.3)
Chloride: 110 mmol/L (ref 98–111)
Creatinine, Ser: 1.07 mg/dL — ABNORMAL HIGH (ref 0.44–1.00)
GFR, Estimated: 55 mL/min — ABNORMAL LOW (ref >60)
Glucose, Bld: 146 mg/dL — ABNORMAL HIGH (ref 70–99)
Potassium: 3.8 mmol/L (ref 3.5–5.1)
Sodium: 139 mmol/L (ref 135–145)
Total Bilirubin: 0.5 mg/dL (ref 0.0–1.2)
Total Protein: 4.6 g/dL — ABNORMAL LOW (ref 6.5–8.1)

## 2024-07-21 LAB — PHOSPHORUS: Phosphorus: 3.9 mg/dL (ref 2.5–4.6)

## 2024-07-21 LAB — GLUCOSE, CAPILLARY
Glucose-Capillary: 142 mg/dL — ABNORMAL HIGH (ref 70–99)
Glucose-Capillary: 140 mg/dL — ABNORMAL HIGH (ref 70–99)
Glucose-Capillary: 130 mg/dL — ABNORMAL HIGH (ref 70–99)

## 2024-07-21 LAB — ABO/RH: ABO/RH(D): B POS

## 2024-07-21 MED ORDER — CEFAZOLIN SODIUM-DEXTROSE 2-4 GM/100ML-% IV SOLN
2.0000 g | INTRAVENOUS | Status: AC
  Administered 2024-07-22 (×2): 2 g via INTRAVENOUS
  Filled 2024-07-21: qty 100

## 2024-07-21 MED ORDER — TRAVASOL 10 % IV SOLN
INTRAVENOUS | Status: AC
  Filled 2024-07-21: qty 936

## 2024-07-21 MED ORDER — SODIUM CHLORIDE 0.9% IV SOLUTION: Freq: Once | INTRAVENOUS | Status: AC

## 2024-07-21 MED ORDER — MAGNESIUM SULFATE 2 GM/50ML IV SOLN
2.0000 g | Freq: Once | INTRAVENOUS | Status: AC
  Administered 2024-07-21: 2 g via INTRAVENOUS
  Filled 2024-07-21: qty 50

## 2024-07-21 MED ORDER — POTASSIUM CHLORIDE 10 MEQ/50ML IV SOLN
10.0000 meq | INTRAVENOUS | Status: AC
  Administered 2024-07-21 (×2): 10 meq via INTRAVENOUS
  Filled 2024-07-21 (×2): qty 50

## 2024-07-21 NOTE — Plan of Care (Signed)
  Problem: Education: Goal: Knowledge of General Education information will improve Description: Including pain rating scale, medication(s)/side effects and non-pharmacologic comfort measures 07/21/2024 0521 by Taft Sari POUR, RN Outcome: Progressing 07/21/2024 0521 by Taft Sari POUR, RN Outcome: Progressing   Problem: Health Behavior/Discharge Planning: Goal: Ability to manage health-related needs will improve 07/21/2024 0521 by Taft Sari POUR, RN Outcome: Progressing 07/21/2024 0521 by Taft Sari POUR, RN Outcome: Progressing   Problem: Clinical Measurements: Goal: Ability to maintain clinical measurements within normal limits will improve 07/21/2024 0521 by Taft Sari POUR, RN Outcome: Progressing 07/21/2024 0521 by Taft Sari POUR, RN Outcome: Progressing Goal: Will remain free from infection 07/21/2024 0521 by Taft Sari POUR, RN Outcome: Progressing 07/21/2024 0521 by Taft Sari POUR, RN Outcome: Progressing Goal: Diagnostic test results will improve 07/21/2024 0521 by Taft Sari POUR, RN Outcome: Progressing 07/21/2024 0521 by Taft Sari POUR, RN Outcome: Progressing Goal: Respiratory complications will improve 07/21/2024 0521 by Taft Sari POUR, RN Outcome: Progressing 07/21/2024 0521 by Taft Sari POUR, RN Outcome: Progressing Goal: Cardiovascular complication will be avoided 07/21/2024 0521 by Taft Sari POUR, RN Outcome: Progressing 07/21/2024 0521 by Taft Sari POUR, RN Outcome: Progressing   Problem: Activity: Goal: Risk for activity intolerance will decrease 07/21/2024 0521 by Taft Sari POUR, RN Outcome: Progressing 07/21/2024 0521 by Taft Sari POUR, RN Outcome: Progressing   Problem: Nutrition: Goal: Adequate nutrition will be maintained 07/21/2024 0521 by Taft Sari POUR, RN Outcome: Progressing 07/21/2024 0521 by Taft Sari POUR, RN Outcome: Progressing   Problem: Coping: Goal: Level of anxiety will decrease 07/21/2024 0521 by Taft Sari POUR, RN Outcome:  Progressing 07/21/2024 0521 by Taft Sari POUR, RN Outcome: Progressing   Problem: Elimination: Goal: Will not experience complications related to bowel motility 07/21/2024 0521 by Taft Sari POUR, RN Outcome: Progressing 07/21/2024 0521 by Taft Sari POUR, RN Outcome: Progressing Goal: Will not experience complications related to urinary retention 07/21/2024 0521 by Taft Sari POUR, RN Outcome: Progressing 07/21/2024 0521 by Taft Sari POUR, RN Outcome: Progressing   Problem: Pain Managment: Goal: General experience of comfort will improve and/or be controlled 07/21/2024 0521 by Taft Sari POUR, RN Outcome: Progressing 07/21/2024 0521 by Taft Sari POUR, RN Outcome: Progressing   Problem: Safety: Goal: Ability to remain free from injury will improve 07/21/2024 0521 by Taft Sari POUR, RN Outcome: Progressing 07/21/2024 0521 by Taft Sari POUR, RN Outcome: Progressing   Problem: Skin Integrity: Goal: Risk for impaired skin integrity will decrease 07/21/2024 0521 by Taft Sari POUR, RN Outcome: Progressing 07/21/2024 0521 by Taft Sari POUR, RN Outcome: Progressing

## 2024-07-21 NOTE — Progress Notes (Signed)
 PHARMACY - TOTAL PARENTERAL NUTRITION CONSULT NOTE   Indication: Gastric outlet obstruction 2nd to mass   Patient Measurements: Height: 5' 8 (172.7 cm) Weight: 106 kg (233 lb 11 oz) IBW/kg (Calculated) : 63.9 TPN AdjBW (KG): 74.4 Body mass index is 35.53 kg/m. Usual Weight:   Assessment: Pharmacy consulted to manage TPN for nutrition support in [name] with gastric outlet obstruction 2nd mass.  PMH: HLD, prediabetes, CKDII does not take any medication on regular basis, past surgical h/o S/P total hysterectomy and BSO   Glucose / Insulin : hx of prediabetes (A1c 6.1 in Aug 2025), no DM medication PTA - had to transition cyclic TPN back to continuous d/t uncontrolled CBGs - CBGs <180; 10 units rSSI + 60 units insulin  R in TPN Electrolytes: Na 139, K 3.8, Cl 110, Ca 8.5 [CoCa 9.86], Phos 3.9, Mag 1.8 Renal: SCr 1.07; BUN 23 Hepatic: LFTs, Tbili stable WNL - albumin  2.3 - TG 71 I/O: - UOP 2+ unmeasured; NG 425 mL - LBM: 9/2 - mIVF: none GI Imaging:  - 8/22 CT A/P: SBO from mass - 8/25: AXR: duodenal stricture suspicious for mass & malignancy - 8/28 AXR: Nonobstructive bowel gas pattern.  GI Surgeries / Procedures:  - 8/23 SB enteroscopy: esophagitis; duodenal mass pathology shows invasive adenocarcinoma - 9/9: whipple procedure  Central access: PICC placed  8/26 for TPN TPN start date: 07/08/2024  Nutritional Goals: RD Assessment: Estimated Needs Total Energy Estimated Needs: 1700-1900 Total Protein Estimated Needs: 75-95 grams Total Fluid Estimated Needs: >/= 1.9 L   TPN @ 75 ml/hr provides 94 g protein & 1832 kcals daily  Current Nutrition:  NPO TPN  **TPN was cycled per request by [facility] in hopes that pt would be discharged home before surgery, however pt now to remain in hospital until surgery**  Plan:  Today, at 1800:  Continue TPN at 75 ml/hr Electrolytes in TPN: no changes Na 55 mEq/L  K 55 mEq/L  Ca 6 mEq/L  Mg 5 mEq/L  Phos 20 mmol/L Cl:Ac maximize  acetate salts Give 2 grams iv mag and 2 runs of K today Add standard MVI and trace elements to TPN Chromium withheld d/t critical shortage 60 units regular insulin  in TPN bag Continue resistant-scale SSI; can reduce to q8 hr CBG checks to minimize fingersticks mIVF per [facility] - none currently Monitor TPN labs on Mon/Thurs, prn   Lauren Gray BS, PharmD, BCPS Clinical Pharmacist 07/21/2024 7:04 AM  Contact: [phone] after 3 PM

## 2024-07-21 NOTE — Progress Notes (Signed)
 [facility] Progress Note  15 Days Post-Op  Subjective: CC:  NAEO. Reports throat irritation from NG tube. Having flatus. Reports her last BM was one week ago. Her husband and sister are at the bedside. She reports having another sister and a grandson as well - good support system.   Objective: Vital signs in last 24 hours: Temp:  [97.7 F (36.5 C)-98.7 F (37.1 C)] 98.3 F (36.8 C) (09/08 0744) Pulse Rate:  [67-77] 69 (09/08 0744) Resp:  [16-20] 16 (09/08 0510) BP: (149-162)/(57-77) 149/60 (09/08 0744) SpO2:  [94 %-100 %] 96 % (09/08 0744) Weight:  [106 kg] 106 kg (09/08 0500) Last BM Date : 07/15/24 (per chart)  Intake/Output from previous day: 09/07 0701 - 09/08 0700 In: 665.2 [I.V.:665.2] Out: 425 [Emesis/NG output:425] Intake/Output this shift: No intake/output data recorded.  PE: Gen:  Alert, NAD, pleasant Card:  Regular rate and rhythm Pulm:  Normal effort ORA Abd: Soft, non-tender, non-distended, NG in place with bilious, non-bloody effluent (425 mL) Skin: warm and dry, no rashes  Psych: A&Ox3   Lab Results:  Recent Labs    07/20/24 0326  WBC 6.1  HGB 10.8*  HCT 34.8*  PLT 224   BMET Recent Labs    07/20/24 0326 07/21/24 0609  NA 143 139  K 3.8 3.8  CL 110 110  CO2 23 24  GLUCOSE 145* 146*  BUN 24* 23  CREATININE 0.91 1.07*  CALCIUM  8.7* 8.5*   PT/INR No results for input(s): LABPROT, INR in the last 72 hours. CMP     Component Value Date/Time   NA 139 07/21/2024 0609   NA 142 06/18/2024 1445   K 3.8 07/21/2024 0609   CL 110 07/21/2024 0609   CO2 24 07/21/2024 0609   GLUCOSE 146 (H) 07/21/2024 0609   BUN 23 07/21/2024 0609   BUN 14 06/18/2024 1445   CREATININE 1.07 (H) 07/21/2024 0609   CREATININE 1.12 (H) 12/27/2021 0846   CALCIUM  8.5 (L) 07/21/2024 0609   PROT 4.6 (L) 07/21/2024 0609   PROT 7.2 06/18/2024 1445   ALBUMIN  2.3 (L) 07/21/2024 0609   ALBUMIN  4.3 06/18/2024 1445   AST 22 07/21/2024 0609   ALT 37  07/21/2024 0609   ALKPHOS 82 07/21/2024 0609   BILITOT 0.5 07/21/2024 0609   BILITOT 0.4 06/18/2024 1445   GFRNONAA 55 (L) 07/21/2024 0609   Lipase     Component Value Date/Time   LIPASE 34 07/06/2024 0441       Studies/Results: No results found.  Anti-infectives: Anti-infectives (From admission, onward)    None        Assessment/Plan  Gastric outlet obstruction secondary to mass in D3, invasive adenocarcinoma - S/p EGD with bx 8/24.  Path with invasive adenocarcinoma - PICC/TPN - Surgery scheduled for tomorrow 9/9 at 0930 with Dr. Aron.  We discussed that post-operatively she will have her NG tube in place and will be in the ICU. Dr. Aron will be by later this afternoon or early evening to meet with the family and go over the procedure in detail.   FEN - NPO x ice chips and few sips, one popsicle is ok, NGT to LIWS, TPN VTE - SCDs, Lovenox  ID - None currently needed     LOS: 17 days   I reviewed nursing notes, hospitalist notes, last 24 h vitals and pain scores, last 48 h intake and output, last 24 h labs and trends, and last 24 h imaging results.  This care required moderate  level of medical decision making.   Almarie Pringle, PA-C [facility] Please see Amion for pager number during day hours 7:00am-4:30pm

## 2024-07-21 NOTE — Progress Notes (Signed)
 Patient has voiced concerns about room this shift. Patient states room is not large enough for her family to come visit her. She states bathroom is not large enough for her to enter and have IV pole in the bathroom with her. Patient was informed that this unit does not have larger rooms or bathrooms.  Patient is concerned about the foot board on her bed due to two places on the inner frame. This RN attempted to change the foot board around 2220 while in room administering patient's medications, however patient states she is comfortable and does not want it changed at this time. The foot board was changed around 0320 while patient was up to bathroom.

## 2024-07-21 NOTE — Progress Notes (Signed)
 Nutrition Follow-up  DOCUMENTATION CODES:   Obesity unspecified  INTERVENTION:  -NPO -Continue TPN @ goal 75 ml/hr, managed by pharm -Daily weights  Per Pharmacy: Continue TPN at 75 ml/hr Electrolytes in TPN: no changes Na 55 mEq/L  K 55 mEq/L  Ca 6 mEq/L  Mg 5 mEq/L  Phos 20 mmol/L Cl:Ac maximize acetate salts Give 2 grams iv mag and 2 runs of K today Add standard MVI and trace elements to TPN Chromium withheld d/t critical shortage 60 units regular insulin  in TPN bag Continue resistant-scale SSI; can reduce to q8 hr CBG checks to minimize fingersticks mIVF per TRH - none currently Monitor TPN labs on Mon/Thurs, prn   NUTRITION DIAGNOSIS:   Inadequate oral intake related to nausea, vomiting (GOO r/t duodenal mass) as evidenced by per patient/family report.  Ongoing  GOAL:   Patient will meet greater than or equal to 90% of their needs  Met with TPN  MONITOR:   Labs, Weight trends (TPN)  REASON FOR ASSESSMENT:   Consult New TPN/TNA  ASSESSMENT:   73 y.o. female with medical history significant of HLD, prediabetes, CKDII does not take any medication on regular basis,  past surgical h/o S/P total hysterectomy and BSO with  presents with vomiting x 1 month, can only hold down crackers and yogurt, has not had a full bowel movement in a few weeks, last vomited this morning, last bm with small pebbles this morning, +flatus, no ab pain, + weight loss of 10lbs in the last months.  Found to have gastric outlet obstruction related to a mass.  8/22: admitted [facility], NPO 8/23: FLD, s/p SB enteroscopy : reflux esophagitis, small hiatal hernia, duodenal mass, biopsy 8/24: CLD 8/25: CLD 8/26: TPN initiated via PICC 8/28: NPO, NGT placed to suction 9/07: Transfer to [facility] for Whipple 9/9  Spoke to pt and family in room. Pt denies any changes today. No issues or concerns; scheduled for Whipple procedure tomorrow. She continues on TPN with NG on suction. Output of 425 ml. Pt  family deny any questions at this time, will continue to monitor, RDN available prn.   Labs BG 139-154 Cr 1.07 Ca 8.5 Albumin  2.3 GFR 55 TG 71 H/H 10.8/34.8  Medications  Chlorhexidine  Gluconate Cloth  6 each Topical Daily   enoxaparin  (LOVENOX ) injection  40 mg Subcutaneous Daily   fluticasone   2 spray Each Nare Daily   insulin  aspart  0-20 Units Subcutaneous Q8H   metoprolol  tartrate  5 mg Intravenous Q6H   pantoprazole  (PROTONIX ) IV  40 mg Intravenous Q12H     NUTRITION - FOCUSED PHYSICAL EXAM:  Flowsheet Row Most Recent Value  Orbital Region No depletion  Upper Arm Region No depletion  Thoracic and Lumbar Region No depletion  Buccal Region No depletion  Temple Region No depletion  Clavicle Bone Region No depletion  Clavicle and Acromion Bone Region No depletion  Scapular Bone Region No depletion  Dorsal Hand Mild depletion  Patellar Region No depletion  Anterior Thigh Region No depletion  Posterior Calf Region No depletion  Edema (RD Assessment) None  Hair Unable to assess  [covered]  Eyes Reviewed  Mouth Reviewed  Skin Reviewed  Nails Reviewed    Diet Order:   Diet Order             Diet NPO time specified Except for: Ice Chips, Other (See Comments)  Diet effective now  EDUCATION NEEDS:   Education needs have been addressed  Skin:  Skin Assessment: Reviewed RN Assessment  Last BM:  9/2-type 1  Height:   Ht Readings from Last 1 Encounters:  07/20/24 5' 8 (1.727 m)    Weight:   Wt Readings from Last 1 Encounters:  07/21/24 106 kg   BMI:  Body mass index is 35.53 kg/m.  Estimated Nutritional Needs:   Kcal:  1700-1900  Protein:  75-95g  Fluid:  1.9L/day  Lauren Gray, RDN, LDN Registered Dietitian Nutritionist RD Inpatient Contact Info in [place]

## 2024-07-21 NOTE — Progress Notes (Signed)
 [facility]  PROGRESS NOTE  Lauren Gray FMW:[id number] DOB: 14-Jun-1951 DOA: 07/04/2024 PCP: Paseda, Folashade R, FNP   Brief HPI/Hospital Course:     73 y.o. female with medical history significant of HLD, prediabetes, CKDII does not take any medication on regular basis,  past surgical h/o S/P total hysterectomy and BSO with  presents with vomiting x 1 month, can only hold down crackers and yogurt, has not had a full bowel movement in a few weeks, last vomited this morning, last bm with small pebbles this morning, +flatus, no ab pain, + weight loss of 10lbs in the last months.  Found to have gastric outlet obstruction related to a mass.  Plan is for surgery at [facility] on 9/9-will need to be transferred over 2 to 3 days prior- -plan to tx on 9/7 (Sunday)  Subjective   The patient is resting comfortably with family at bedside. No new complaints.   Assessment/Plan:   Duodenal adenocarcinoma with gastric outlet obstruction CT scan shows dilated 1st and 2nd portion of duodenum with possible underlying mass. Patient was seen by gastroenterology and underwent small bowel enteroscopy.  The duodenal mass was biopsied.  Pathology is positive for adenocarcinoma. General surgery is following.  Patient has poor nutritional status.  Due to gastric outlet obstruction from the duodenal mass patient has been started on TPN.   Surgery (Whipple procedure) is planned for 9/9. Due to persistent nausea and vomiting NG tube was inserted.   Chloraseptic spray and viscous lidocaine  can be used for mouth/throat discomfort.  Remains stable for the most part.   Hypokalemia/hypophosphatemia Continue to monitor and supplement as indicated.  Most of the supplementation is through TPN.     Essential hypertension Continue scheduled IV metoprolol .  Anxiety is contributing to elevated blood pressure readings.  Situational anxiety Ativan  as needed.  Hyperglycemia Was initially hypoglycemic.  Now  noted to be hyperglycemic.  She is noted to be on every 4 hour coverage.    AKI on CKDII vs CKD IIIa Appears resolved  Mild elevated lipase Resolved  DVT prophylaxis: SCDs Code Status: Full code Family Communication: family at bedside Disposition: Plan is for surgery 9/9 which will be done at [facility].  Disposition to be determined subsequently.    Medications    Chlorhexidine  Gluconate Cloth  6 each Topical Daily   enoxaparin  (LOVENOX ) injection  40 mg Subcutaneous Daily   fluticasone   2 spray Each Nare Daily   insulin  aspart  0-20 Units Subcutaneous Q8H   metoprolol  tartrate  5 mg Intravenous Q6H   pantoprazole  (PROTONIX ) IV  40 mg Intravenous Q12H    Objective   Vitals:   07/21/24 0500 07/21/24 0510 07/21/24 0744 07/21/24 1424  BP:  (!) 162/62 (!) 149/60 (!) 153/63  Pulse:  72 69 74  Resp:  16    Temp:  98.7 F (37.1 C) 98.3 F (36.8 C) 98.1 F (36.7 C)  TempSrc:  Oral  Oral  SpO2:  94% 96% 100%  Weight: 106 kg     Height:        Physical Examination:  Exam:  Constitutional:  The patient is awake, alert, and oriented x 3. No acute distress. Respiratory:  No increased work of breathing. No wheezes, rales, or rhonchi No tactile fremitus Cardiovascular:  Regular rate and rhythm No murmurs, ectopy, or gallups. No lateral PMI. No thrills. Abdomen:  Abdomen is slightly distended, non-tender, and soft.  No hernias, masses, or organomegaly Normoactive bowel sounds.  Musculoskeletal:  No cyanosis, clubbing, or edema Skin:  No rashes, lesions, ulcers palpation of skin: no induration or nodules Neurologic:  CN 2-12 intact Sensation all 4 extremities intact Psychiatric:  Mental status Mood, affect appropriate Orientation to person, place, time  judgment and insight appear intact   Data Reviewed:  Basic Metabolic Panel: Recent Labs  Lab 07/15/24 0627 07/16/24 0129 07/17/24 0424 07/20/24 0326 07/21/24 0609  NA 142 142 144 143 139  K 4.3 4.2  4.2 3.8 3.8  CL 110 109 111 110 110  CO2 21* 21* 23 23 24   GLUCOSE 287* 347* 247* 145* 146*  BUN 27* 27* 27* 24* 23  CREATININE 0.92 1.00 0.91 0.91 1.07*  CALCIUM  9.3 9.0 9.1 8.7* 8.5*  MG 2.5* 2.2 2.3  --  1.8  PHOS 2.7  --  3.1  --  3.9    CBC: Recent Labs  Lab 07/15/24 0627 07/20/24 0326  WBC 7.9 6.1  HGB 11.5* 10.8*  HCT 37.6 34.8*  MCV 96.2 96.1  PLT 226 224    LFT Recent Labs  Lab 07/17/24 0424 07/21/24 0609  AST 21 22  ALT 36 37  ALKPHOS 82 82  BILITOT 0.5 0.5  PROT 6.5 4.6*  ALBUMIN  3.2* 2.3*     Lauren Gray   [facility] If 7PM-7AM, please contact night-coverage at [email or web address], Office  [phone]   07/21/2024, 2:45 PM  LOS: 17 days

## 2024-07-21 NOTE — H&P (View-Only) (Signed)
 [facility] Progress Note  15 Days Post-Op  Subjective: CC:  NAEO. Reports throat irritation from NG tube. Having flatus. Reports her last BM was one week ago. Her husband and sister are at the bedside. She reports having another sister and a grandson as well - good support system.   Objective: Vital signs in last 24 hours: Temp:  [97.7 F (36.5 C)-98.7 F (37.1 C)] 98.3 F (36.8 C) (09/08 0744) Pulse Rate:  [67-77] 69 (09/08 0744) Resp:  [16-20] 16 (09/08 0510) BP: (149-162)/(57-77) 149/60 (09/08 0744) SpO2:  [94 %-100 %] 96 % (09/08 0744) Weight:  [106 kg] 106 kg (09/08 0500) Last BM Date : 07/15/24 (per chart)  Intake/Output from previous day: 09/07 0701 - 09/08 0700 In: 665.2 [I.V.:665.2] Out: 425 [Emesis/NG output:425] Intake/Output this shift: No intake/output data recorded.  PE: Gen:  Alert, NAD, pleasant Card:  Regular rate and rhythm Pulm:  Normal effort ORA Abd: Soft, non-tender, non-distended, NG in place with bilious, non-bloody effluent (425 mL) Skin: warm and dry, no rashes  Psych: A&Ox3   Lab Results:  Recent Labs    07/20/24 0326  WBC 6.1  HGB 10.8*  HCT 34.8*  PLT 224   BMET Recent Labs    07/20/24 0326 07/21/24 0609  NA 143 139  K 3.8 3.8  CL 110 110  CO2 23 24  GLUCOSE 145* 146*  BUN 24* 23  CREATININE 0.91 1.07*  CALCIUM  8.7* 8.5*   PT/INR No results for input(s): LABPROT, INR in the last 72 hours. CMP     Component Value Date/Time   NA 139 07/21/2024 0609   NA 142 06/18/2024 1445   K 3.8 07/21/2024 0609   CL 110 07/21/2024 0609   CO2 24 07/21/2024 0609   GLUCOSE 146 (H) 07/21/2024 0609   BUN 23 07/21/2024 0609   BUN 14 06/18/2024 1445   CREATININE 1.07 (H) 07/21/2024 0609   CREATININE 1.12 (H) 12/27/2021 0846   CALCIUM  8.5 (L) 07/21/2024 0609   PROT 4.6 (L) 07/21/2024 0609   PROT 7.2 06/18/2024 1445   ALBUMIN  2.3 (L) 07/21/2024 0609   ALBUMIN  4.3 06/18/2024 1445   AST 22 07/21/2024 0609   ALT 37  07/21/2024 0609   ALKPHOS 82 07/21/2024 0609   BILITOT 0.5 07/21/2024 0609   BILITOT 0.4 06/18/2024 1445   GFRNONAA 55 (L) 07/21/2024 0609   Lipase     Component Value Date/Time   LIPASE 34 07/06/2024 0441       Studies/Results: No results found.  Anti-infectives: Anti-infectives (From admission, onward)    None        Assessment/Plan  Gastric outlet obstruction secondary to mass in D3, invasive adenocarcinoma - S/p EGD with bx 8/24.  Path with invasive adenocarcinoma - PICC/TPN - Surgery scheduled for tomorrow 9/9 at 0930 with Dr. Aron.  We discussed that post-operatively she will have her NG tube in place and will be in the ICU. Dr. Aron will be by later this afternoon or early evening to meet with the family and go over the procedure in detail.   FEN - NPO x ice chips and few sips, one popsicle is ok, NGT to LIWS, TPN VTE - SCDs, Lovenox  ID - None currently needed     LOS: 17 days   I reviewed nursing notes, hospitalist notes, last 24 h vitals and pain scores, last 48 h intake and output, last 24 h labs and trends, and last 24 h imaging results.  This care required moderate  level of medical decision making.   Almarie Pringle, PA-C [facility] Please see Amion for pager number during day hours 7:00am-4:30pm

## 2024-07-22 ENCOUNTER — Encounter (HOSPITAL_COMMUNITY): Payer: Self-pay | Admitting: Internal Medicine

## 2024-07-22 ENCOUNTER — Inpatient Hospital Stay (HOSPITAL_COMMUNITY): Payer: Self-pay | Admitting: Anesthesiology

## 2024-07-22 ENCOUNTER — Encounter (HOSPITAL_COMMUNITY)
Admission: EM | Disposition: A | Discharge status: Rehabilitation Facility | Payer: Self-pay | Source: Home / Self Care | Attending: Family Medicine

## 2024-07-22 DIAGNOSIS — C170 Malignant neoplasm of duodenum: Secondary | ICD-10-CM

## 2024-07-22 DIAGNOSIS — E1165 Type 2 diabetes mellitus with hyperglycemia: Secondary | ICD-10-CM | POA: Diagnosis not present

## 2024-07-22 DIAGNOSIS — R112 Nausea with vomiting, unspecified: Secondary | ICD-10-CM | POA: Diagnosis not present

## 2024-07-22 DIAGNOSIS — D638 Anemia in other chronic diseases classified elsewhere: Secondary | ICD-10-CM

## 2024-07-22 DIAGNOSIS — K56609 Unspecified intestinal obstruction, unspecified as to partial versus complete obstruction: Secondary | ICD-10-CM | POA: Diagnosis not present

## 2024-07-22 HISTORY — PX: WHIPPLE PROCEDURE: SHX2667

## 2024-07-22 HISTORY — PX: JEJUNOSTOMY: SHX313

## 2024-07-22 LAB — POCT I-STAT 7, (LYTES, BLD GAS, ICA,H+H)
Acid-base deficit: 2 mmol/L (ref 0.0–2.0)
Acid-base deficit: 3 mmol/L — ABNORMAL HIGH (ref 0.0–2.0)
Acid-base deficit: 4 mmol/L — ABNORMAL HIGH (ref 0.0–2.0)
Bicarbonate: 22.4 mmol/L (ref 20.0–28.0)
Bicarbonate: 22.6 mmol/L (ref 20.0–28.0)
Bicarbonate: 21.3 mmol/L (ref 20.0–28.0)
Calcium, Ion: 1.16 mmol/L (ref 1.15–1.40)
Calcium, Ion: 1.15 mmol/L (ref 1.15–1.40)
Calcium, Ion: 1.09 mmol/L — ABNORMAL LOW (ref 1.15–1.40)
HCT: 28 % — ABNORMAL LOW (ref 36.0–46.0)
HCT: 29 % — ABNORMAL LOW (ref 36.0–46.0)
HCT: 29 % — ABNORMAL LOW (ref 36.0–46.0)
Hemoglobin: 9.5 g/dL — ABNORMAL LOW (ref 12.0–15.0)
Hemoglobin: 9.9 g/dL — ABNORMAL LOW (ref 12.0–15.0)
Hemoglobin: 9.9 g/dL — ABNORMAL LOW (ref 12.0–15.0)
O2 Saturation: 100 %
O2 Saturation: 100 %
O2 Saturation: 100 %
Patient temperature: 35.4
Patient temperature: 36
Patient temperature: 36.6
Potassium: 4.1 mmol/L (ref 3.5–5.1)
Potassium: 4.3 mmol/L (ref 3.5–5.1)
Potassium: 4.6 mmol/L (ref 3.5–5.1)
Sodium: 140 mmol/L (ref 135–145)
Sodium: 140 mmol/L (ref 135–145)
Sodium: 141 mmol/L (ref 135–145)
TCO2: 23 mmol/L (ref 22–32)
TCO2: 24 mmol/L (ref 22–32)
TCO2: 23 mmol/L (ref 22–32)
pCO2 arterial: 34.7 mmHg (ref 32–48)
pCO2 arterial: 38.3 mmHg (ref 32–48)
pCO2 arterial: 40.6 mmHg (ref 32–48)
pH, Arterial: 7.41 (ref 7.35–7.45)
pH, Arterial: 7.375 (ref 7.35–7.45)
pH, Arterial: 7.327 — ABNORMAL LOW (ref 7.35–7.45)
pO2, Arterial: 267 mmHg — ABNORMAL HIGH (ref 83–108)
pO2, Arterial: 257 mmHg — ABNORMAL HIGH (ref 83–108)
pO2, Arterial: 270 mmHg — ABNORMAL HIGH (ref 83–108)

## 2024-07-22 LAB — COMPREHENSIVE METABOLIC PANEL WITH GFR
ALT: 35 U/L (ref 0–44)
AST: 22 U/L (ref 15–41)
Albumin: 2.4 g/dL — ABNORMAL LOW (ref 3.5–5.0)
Alkaline Phosphatase: 82 U/L (ref 38–126)
Anion gap: 6 (ref 5–15)
BUN: 21 mg/dL (ref 8–23)
CO2: 24 mmol/L (ref 22–32)
Calcium: 8.5 mg/dL — ABNORMAL LOW (ref 8.9–10.3)
Chloride: 110 mmol/L (ref 98–111)
Creatinine, Ser: 1.06 mg/dL — ABNORMAL HIGH (ref 0.44–1.00)
GFR, Estimated: 55 mL/min — ABNORMAL LOW (ref >60)
Glucose, Bld: 115 mg/dL — ABNORMAL HIGH (ref 70–99)
Potassium: 3.7 mmol/L (ref 3.5–5.1)
Sodium: 140 mmol/L (ref 135–145)
Total Bilirubin: 0.6 mg/dL (ref 0.0–1.2)
Total Protein: 5.8 g/dL — ABNORMAL LOW (ref 6.5–8.1)

## 2024-07-22 LAB — MAGNESIUM: Magnesium: 2 mg/dL (ref 1.7–2.4)

## 2024-07-22 LAB — CBC WITH DIFFERENTIAL/PLATELET
Abs Immature Granulocytes: 0.03 K/uL (ref 0.00–0.07)
Basophils Absolute: 0 K/uL (ref 0.0–0.1)
Basophils Relative: 1 %
Eosinophils Absolute: 0.3 K/uL (ref 0.0–0.5)
Eosinophils Relative: 4 %
HCT: 35.1 % — ABNORMAL LOW (ref 36.0–46.0)
Hemoglobin: 11.2 g/dL — ABNORMAL LOW (ref 12.0–15.0)
Immature Granulocytes: 1 %
Lymphocytes Relative: 26 %
Lymphs Abs: 1.6 K/uL (ref 0.7–4.0)
MCH: 30.4 pg (ref 26.0–34.0)
MCHC: 31.9 g/dL (ref 30.0–36.0)
MCV: 95.4 fL (ref 80.0–100.0)
Monocytes Absolute: 0.4 K/uL (ref 0.1–1.0)
Monocytes Relative: 7 %
Neutro Abs: 3.7 K/uL (ref 1.7–7.7)
Neutrophils Relative %: 61 %
Platelets: 241 K/uL (ref 150–400)
RBC: 3.68 MIL/uL — ABNORMAL LOW (ref 3.87–5.11)
RDW: 12.7 % (ref 11.5–15.5)
WBC: 6.1 K/uL (ref 4.0–10.5)
nRBC: 0 % (ref 0.0–0.2)

## 2024-07-22 LAB — MRSA NEXT GEN BY PCR, NASAL: MRSA by PCR Next Gen: NOT DETECTED

## 2024-07-22 LAB — GLUCOSE, CAPILLARY
Glucose-Capillary: 120 mg/dL — ABNORMAL HIGH (ref 70–99)
Glucose-Capillary: 150 mg/dL — ABNORMAL HIGH (ref 70–99)
Glucose-Capillary: 314 mg/dL — ABNORMAL HIGH (ref 70–99)
Glucose-Capillary: 317 mg/dL — ABNORMAL HIGH (ref 70–99)

## 2024-07-22 LAB — PROTIME-INR
INR: 1 (ref 0.8–1.2)
Prothrombin Time: 13.9 s (ref 11.4–15.2)

## 2024-07-22 LAB — PREPARE RBC (CROSSMATCH)

## 2024-07-22 SURGERY — WHIPPLE PROCEDURE
Anesthesia: Epidural | Site: Abdomen

## 2024-07-22 MED ORDER — MIDAZOLAM HCL 2 MG/2ML IJ SOLN
INTRAMUSCULAR | Status: DC | PRN
Start: 2024-07-22 — End: 2024-07-22
  Administered 2024-07-22: 2 mg via INTRAVENOUS

## 2024-07-22 MED ORDER — DIPHENHYDRAMINE HCL 50 MG/ML IJ SOLN: 12.5000 mg | Freq: Four times a day (QID) | INTRAMUSCULAR | Status: DC | PRN

## 2024-07-22 MED ORDER — PROPOFOL 10 MG/ML IV BOLUS
INTRAVENOUS | Status: DC | PRN
  Administered 2024-07-22: 100 mg via INTRAVENOUS

## 2024-07-22 MED ORDER — ORAL CARE MOUTH RINSE: 15.0000 mL | OROMUCOSAL | Status: DC | PRN

## 2024-07-22 MED ORDER — FENTANYL CITRATE (PF) 250 MCG/5ML IJ SOLN
INTRAMUSCULAR | Status: AC
  Filled 2024-07-22: qty 5

## 2024-07-22 MED ORDER — ACETAMINOPHEN 10 MG/ML IV SOLN
1000.0000 mg | Freq: Once | INTRAVENOUS | Status: AC
  Administered 2024-07-22: 1000 mg via INTRAVENOUS

## 2024-07-22 MED ORDER — PHENYLEPHRINE HCL-NACL 20-0.9 MG/250ML-% IV SOLN
INTRAVENOUS | Status: DC | PRN
  Administered 2024-07-22: 50 ug/min via INTRAVENOUS

## 2024-07-22 MED ORDER — FENTANYL CITRATE (PF) 100 MCG/2ML IJ SOLN
INTRAMUSCULAR | Status: AC
  Filled 2024-07-22: qty 2

## 2024-07-22 MED ORDER — ACETAMINOPHEN 500 MG PO TABS: 1000.0000 mg | ORAL_TABLET | Freq: Once | ORAL | Status: DC

## 2024-07-22 MED ORDER — LIDOCAINE 2% (20 MG/ML) 5 ML SYRINGE
INTRAMUSCULAR | Status: AC
  Filled 2024-07-22: qty 5

## 2024-07-22 MED ORDER — SUCCINYLCHOLINE CHLORIDE 200 MG/10ML IV SOSY
PREFILLED_SYRINGE | INTRAVENOUS | Status: DC | PRN
  Administered 2024-07-22: 140 mg via INTRAVENOUS

## 2024-07-22 MED ORDER — GLYCOPYRROLATE 0.2 MG/ML IJ SOLN
INTRAMUSCULAR | Status: DC | PRN
  Administered 2024-07-22: .2 mg via INTRAVENOUS

## 2024-07-22 MED ORDER — ACETAMINOPHEN 10 MG/ML IV SOLN
1000.0000 mg | Freq: Four times a day (QID) | INTRAVENOUS | Status: AC
  Administered 2024-07-23 (×4): 1000 mg via INTRAVENOUS
  Filled 2024-07-22 (×4): qty 100

## 2024-07-22 MED ORDER — FENTANYL CITRATE (PF) 250 MCG/5ML IJ SOLN
INTRAMUSCULAR | Status: DC | PRN
  Administered 2024-07-22: 100 ug via INTRAVENOUS
  Administered 2024-07-22 (×3): 50 ug via INTRAVENOUS

## 2024-07-22 MED ORDER — LACTATED RINGERS IV SOLN: INTRAVENOUS | Status: DC

## 2024-07-22 MED ORDER — CHLORHEXIDINE GLUCONATE 0.12 % MT SOLN: 15.0000 mL | Freq: Once | OROMUCOSAL | Status: AC

## 2024-07-22 MED ORDER — ALBUMIN HUMAN 5 % IV SOLN: INTRAVENOUS | Status: DC | PRN

## 2024-07-22 MED ORDER — DIPHENHYDRAMINE HCL 12.5 MG/5ML PO ELIX: 12.5000 mg | ORAL_SOLUTION | Freq: Four times a day (QID) | ORAL | Status: DC | PRN

## 2024-07-22 MED ORDER — VITAL HP 1.0 CAL PO LIQD: 1000.0000 mL | ORAL | Status: DC

## 2024-07-22 MED ORDER — PROPOFOL 10 MG/ML IV BOLUS
INTRAVENOUS | Status: AC
  Filled 2024-07-22: qty 20

## 2024-07-22 MED ORDER — PNEUMOCOCCAL 20-VAL CONJ VACC 0.5 ML IM SUSY
0.5000 mL | PREFILLED_SYRINGE | INTRAMUSCULAR | Status: AC
  Administered 2024-07-23: 0.5 mL via INTRAMUSCULAR
  Filled 2024-07-22: qty 0.5

## 2024-07-22 MED ORDER — SODIUM CHLORIDE 0.9% FLUSH: 9.0000 mL | INTRAVENOUS | Status: DC | PRN

## 2024-07-22 MED ORDER — CHLORHEXIDINE GLUCONATE 0.12 % MT SOLN: 15.0000 mL | Freq: Once | OROMUCOSAL | Status: DC

## 2024-07-22 MED ORDER — ROPIVACAINE HCL 2 MG/ML IJ SOLN
12.0000 mL/h | INTRAMUSCULAR | Status: DC
  Administered 2024-07-23 – 2024-07-27 (×7): 12 mL/h via EPIDURAL
  Filled 2024-07-22 (×9): qty 200

## 2024-07-22 MED ORDER — ORAL CARE MOUTH RINSE: 15.0000 mL | Freq: Once | OROMUCOSAL | Status: DC

## 2024-07-22 MED ORDER — ROCURONIUM BROMIDE 10 MG/ML (PF) SYRINGE
PREFILLED_SYRINGE | INTRAVENOUS | Status: DC | PRN
Start: 2024-07-22 — End: 2024-07-22
  Administered 2024-07-22: 30 mg via INTRAVENOUS
  Administered 2024-07-22 (×2): 50 mg via INTRAVENOUS
  Administered 2024-07-22: 30 mg via INTRAVENOUS
  Administered 2024-07-22: 50 mg via INTRAVENOUS
  Administered 2024-07-22: 20 mg via INTRAVENOUS

## 2024-07-22 MED ORDER — 0.9 % SODIUM CHLORIDE (POUR BTL) OPTIME
TOPICAL | Status: DC | PRN
  Administered 2024-07-22: 2000 mL

## 2024-07-22 MED ORDER — ACETAMINOPHEN 10 MG/ML IV SOLN
INTRAVENOUS | Status: AC
  Filled 2024-07-22: qty 100

## 2024-07-22 MED ORDER — HEPARIN SODIUM (PORCINE) 5000 UNIT/ML IJ SOLN
5000.0000 [IU] | Freq: Three times a day (TID) | INTRAMUSCULAR | Status: DC
  Administered 2024-07-23 – 2024-07-27 (×12): 5000 [IU] via SUBCUTANEOUS
  Filled 2024-07-22 (×12): qty 1

## 2024-07-22 MED ORDER — CEFAZOLIN SODIUM-DEXTROSE 2-4 GM/100ML-% IV SOLN
2.0000 g | Freq: Three times a day (TID) | INTRAVENOUS | Status: AC
Start: 2024-07-23 — End: 2024-07-23
  Administered 2024-07-23: 2 g via INTRAVENOUS
  Filled 2024-07-22: qty 100

## 2024-07-22 MED ORDER — CHLORHEXIDINE GLUCONATE 0.12 % MT SOLN
OROMUCOSAL | Status: AC
  Administered 2024-07-22: 15 mL via OROMUCOSAL
  Filled 2024-07-22: qty 15

## 2024-07-22 MED ORDER — SUGAMMADEX SODIUM 200 MG/2ML IV SOLN
INTRAVENOUS | Status: DC | PRN
  Administered 2024-07-22: 200 mg via INTRAVENOUS

## 2024-07-22 MED ORDER — WATER FOR IRRIGATION, STERILE IR SOLN
Status: DC | PRN
  Administered 2024-07-22: 1000 mL

## 2024-07-22 MED ORDER — VISTASEAL 10 ML SINGLE DOSE KIT
PACK | CUTANEOUS | Status: DC | PRN
  Administered 2024-07-22: 10 mL via TOPICAL

## 2024-07-22 MED ORDER — PHENYLEPHRINE 80 MCG/ML (10ML) SYRINGE FOR IV PUSH (FOR BLOOD PRESSURE SUPPORT)
PREFILLED_SYRINGE | INTRAVENOUS | Status: AC
  Filled 2024-07-22: qty 10

## 2024-07-22 MED ORDER — ORAL CARE MOUTH RINSE: 15.0000 mL | Freq: Once | OROMUCOSAL | Status: AC

## 2024-07-22 MED ORDER — ONDANSETRON HCL 4 MG/2ML IJ SOLN: 4.0000 mg | Freq: Once | INTRAMUSCULAR | Status: DC | PRN

## 2024-07-22 MED ORDER — ROPIVACAINE HCL 2 MG/ML IJ SOLN
INTRAMUSCULAR | Status: DC | PRN
  Administered 2024-07-22: 8 mL/h via EPIDURAL

## 2024-07-22 MED ORDER — DEXAMETHASONE SODIUM PHOSPHATE 10 MG/ML IJ SOLN
INTRAMUSCULAR | Status: AC
  Filled 2024-07-22: qty 1

## 2024-07-22 MED ORDER — MIDAZOLAM HCL 2 MG/2ML IJ SOLN
INTRAMUSCULAR | Status: AC
  Filled 2024-07-22: qty 2

## 2024-07-22 MED ORDER — TRAVASOL 10 % IV SOLN
INTRAVENOUS | Status: AC
  Filled 2024-07-22: qty 936

## 2024-07-22 MED ORDER — ROCURONIUM BROMIDE 10 MG/ML (PF) SYRINGE
PREFILLED_SYRINGE | INTRAVENOUS | Status: AC
  Filled 2024-07-22: qty 10

## 2024-07-22 MED ORDER — SODIUM CHLORIDE 0.9% IV SOLUTION: Freq: Once | INTRAVENOUS | Status: DC

## 2024-07-22 MED ORDER — KCL IN DEXTROSE-NACL 20-5-0.45 MEQ/L-%-% IV SOLN
INTRAVENOUS | Status: DC
  Filled 2024-07-22: qty 1000

## 2024-07-22 MED ORDER — POTASSIUM CHLORIDE 10 MEQ/50ML IV SOLN
10.0000 meq | INTRAVENOUS | Status: AC
Start: 2024-07-22 — End: 2024-07-22
  Filled 2024-07-22 (×3): qty 50

## 2024-07-22 MED ORDER — DEXAMETHASONE SODIUM PHOSPHATE 10 MG/ML IJ SOLN
INTRAMUSCULAR | Status: DC | PRN
  Administered 2024-07-22: 5 mg via INTRAVENOUS

## 2024-07-22 MED ORDER — CALCIUM CHLORIDE 10 % IV SOLN
INTRAVENOUS | Status: DC | PRN
  Administered 2024-07-22: 500 mg via INTRAVENOUS

## 2024-07-22 MED ORDER — ACETAMINOPHEN 500 MG PO TABS
ORAL_TABLET | ORAL | Status: AC
  Filled 2024-07-22: qty 2

## 2024-07-22 MED ORDER — MORPHINE SULFATE 1 MG/ML IV SOLN PCA
INTRAVENOUS | Status: DC
  Administered 2024-07-22: 4 mg via INTRAVENOUS
  Administered 2024-07-22: 13 mg via INTRAVENOUS
  Administered 2024-07-23: 10 mg via INTRAVENOUS
  Filled 2024-07-22 (×3): qty 30

## 2024-07-22 MED ORDER — SODIUM CHLORIDE 0.9 % IV SOLN: INTRAVENOUS | Status: DC | PRN

## 2024-07-22 MED ORDER — ONDANSETRON HCL 4 MG/2ML IJ SOLN
INTRAMUSCULAR | Status: AC
  Filled 2024-07-22: qty 2

## 2024-07-22 MED ORDER — NALOXONE HCL 0.4 MG/ML IJ SOLN: 0.4000 mg | INTRAMUSCULAR | Status: DC | PRN

## 2024-07-22 MED ORDER — CHLORHEXIDINE GLUCONATE CLOTH 2 % EX PADS
6.0000 | MEDICATED_PAD | Freq: Every day | CUTANEOUS | Status: DC
  Administered 2024-07-23 – 2024-08-15 (×25): 6 via TOPICAL

## 2024-07-22 MED ORDER — LACTATED RINGERS IV SOLN: INTRAVENOUS | Status: DC | PRN

## 2024-07-22 MED ORDER — FENTANYL CITRATE (PF) 100 MCG/2ML IJ SOLN
25.0000 ug | INTRAMUSCULAR | Status: DC | PRN
  Administered 2024-07-22 (×2): 25 ug via INTRAVENOUS

## 2024-07-22 SURGICAL SUPPLY — 82 items
BAG BILE T-TUBES STRL (MISCELLANEOUS) ×6 IMPLANT
BIOPATCH RED 1 DISK 7.0 (GAUZE/BANDAGES/DRESSINGS) ×4 IMPLANT
BLADE SURG 10 STRL SS (BLADE) ×3 IMPLANT
CANISTER SUCTION 3000ML PPV (SUCTIONS) ×3 IMPLANT
CATH KIT ON-Q SILVERSOAK 7.5 (CATHETERS) IMPLANT
CATH ROBINSON RED A/P 16FR (CATHETERS) IMPLANT
CHLORAPREP W/TINT 26 (MISCELLANEOUS) ×4 IMPLANT
CLAMP SUTURE YELLOW 5 PAIRS (MISCELLANEOUS) ×6 IMPLANT
CLIP LIGATING HEM O LOK PURPLE (MISCELLANEOUS) ×12 IMPLANT
CLIP LIGATING HEMO O LOK GREEN (MISCELLANEOUS) ×3 IMPLANT
CLIP LIGATING HEMOLOK MED (MISCELLANEOUS) ×4 IMPLANT
CLIP TI LARGE 6 (CLIP) ×3 IMPLANT
CLIP TI MEDIUM 24 (CLIP) ×3 IMPLANT
CNTNR URN SCR LID CUP LEK RST (MISCELLANEOUS) IMPLANT
COUNTER NDL 20CT MAGNET RED (NEEDLE) IMPLANT
COVER MAYO STAND STRL (DRAPES) ×3 IMPLANT
COVER SURGICAL LIGHT HANDLE (MISCELLANEOUS) ×3 IMPLANT
DERMABOND ADVANCED .7 DNX12 (GAUZE/BANDAGES/DRESSINGS) ×3 IMPLANT
DRAIN CHANNEL 19F RND (DRAIN) ×6 IMPLANT
DRAIN PENROSE 0.5X18 (DRAIN) ×1 IMPLANT
DRAPE LAPAROSCOPIC ABDOMINAL (DRAPES) ×3 IMPLANT
DRAPE WARM FLUID 44X44 (DRAPES) ×3 IMPLANT
DRESSING PREVENA PLUS CUSTOM (GAUZE/BANDAGES/DRESSINGS) ×1 IMPLANT
DRSG COVADERM 4X10 (GAUZE/BANDAGES/DRESSINGS) IMPLANT
DRSG COVADERM 4X14 (GAUZE/BANDAGES/DRESSINGS) IMPLANT
DRSG COVADERM 4X6 (GAUZE/BANDAGES/DRESSINGS) IMPLANT
DRSG COVADERM 4X8 (GAUZE/BANDAGES/DRESSINGS) IMPLANT
DRSG COVADERM PLUS 2X2 (GAUZE/BANDAGES/DRESSINGS) ×2 IMPLANT
DRSG TEGADERM 4X4.75 (GAUZE/BANDAGES/DRESSINGS) ×2 IMPLANT
DRSG TELFA 3X8 NADH STRL (GAUZE/BANDAGES/DRESSINGS) IMPLANT
ELECT BLADE 6.5 EXT (BLADE) ×3 IMPLANT
ELECT CAUTERY BLADE 6.4 (BLADE) ×3 IMPLANT
ELECTRODE BLDE 4.0 EZ CLN MEGD (MISCELLANEOUS) IMPLANT
ELECTRODE REM PT RTRN 9FT ADLT (ELECTROSURGICAL) ×3 IMPLANT
GAUZE SPONGE 4X4 12PLY STRL (GAUZE/BANDAGES/DRESSINGS) IMPLANT
GEL ULTRASOUND 20GR AQUASONIC (MISCELLANEOUS) ×1 IMPLANT
GLOVE BIOGEL PI MICRO STRL 5.5 (GLOVE) ×3 IMPLANT
GLOVE BIOGEL PI MICRO STRL 6 (GLOVE) ×3 IMPLANT
GOWN STRL REUS W/ TWL LRG LVL3 (GOWN DISPOSABLE) ×10 IMPLANT
GOWN STRL REUS W/ TWL XL LVL3 (GOWN DISPOSABLE) ×6 IMPLANT
HANDLE SUCTION POOLE (INSTRUMENTS) ×1 IMPLANT
HEMOSTAT SURGICEL 2X14 (HEMOSTASIS) IMPLANT
J-TUBE MIC 18FX51 UNV ENFIT (TUBING) ×1 IMPLANT
KIT BASIN OR (CUSTOM PROCEDURE TRAY) ×3 IMPLANT
KIT DRSG PREVENA PLUS 7DAY 125 (MISCELLANEOUS) ×1 IMPLANT
KIT MARKER MARGIN INK (KITS) ×3 IMPLANT
KIT TURNOVER KIT B (KITS) ×3 IMPLANT
LOOP VASCLR MAXI BLUE 18IN ST (MISCELLANEOUS) ×3 IMPLANT
NDL 22X1.5 STRL (OR ONLY) (MISCELLANEOUS) ×2 IMPLANT
NEEDLE 22X1.5 STRL (OR ONLY) (MISCELLANEOUS) IMPLANT
NS IRRIG 1000ML POUR BTL (IV SOLUTION) ×6 IMPLANT
PACK GENERAL/GYN (CUSTOM PROCEDURE TRAY) ×3 IMPLANT
PAD ARMBOARD POSITIONER FOAM (MISCELLANEOUS) ×6 IMPLANT
PLUG CATH AND CAP STRL 200 (CATHETERS) IMPLANT
RELOAD STAPLE 75 3.8 BLU REG (ENDOMECHANICALS) ×4 IMPLANT
RELOAD STAPLE 75 4.5 GRN THCK (ENDOMECHANICALS) IMPLANT
SEPRAFILM PROCEDURAL PACK 3X5 (MISCELLANEOUS) IMPLANT
SHEARS FOC LG CVD HARMONIC 17C (MISCELLANEOUS) ×3 IMPLANT
SLEEVE SUCTION 125 (MISCELLANEOUS) IMPLANT
SLEEVE SUCTION CATH 165 (SLEEVE) ×2 IMPLANT
SPONGE INTESTINAL PEANUT (DISPOSABLE) IMPLANT
SPONGE SURGIFOAM ABS GEL 100 (HEMOSTASIS) IMPLANT
SPONGE T-LAP 18X18 ~~LOC~~+RFID (SPONGE) ×7 IMPLANT
STAPLER PROXIMATE 75MM BLUE (STAPLE) ×3 IMPLANT
STAPLER SKIN PROX 35W (STAPLE) ×3 IMPLANT
SUT ETHILON 2 0 FS 18 (SUTURE) ×8 IMPLANT
SUT PDS AB 1 TP1 96 (SUTURE) ×6 IMPLANT
SUT PDS AB 3-0 SH 27 (SUTURE) ×13 IMPLANT
SUT PDS AB 4-0 RB1 27 (SUTURE) ×36 IMPLANT
SUT PDS PLUS AB 5-0 RB-1 (SUTURE) ×15 IMPLANT
SUT PROLENE 3 0 SH 48 (SUTURE) ×7 IMPLANT
SUT PROLENE 4-0 RB1 .5 CRCL 36 (SUTURE) ×7 IMPLANT
SUT SILK 2 0 SH CR/8 (SUTURE) ×6 IMPLANT
SUT SILK 2 0 TIES 10X30 (SUTURE) ×3 IMPLANT
SUT SILK 3 0 SH CR/8 (SUTURE) ×4 IMPLANT
SUT SILK 3 0 TIES 10X30 (SUTURE) ×3 IMPLANT
TOWEL GREEN STERILE (TOWEL DISPOSABLE) ×3 IMPLANT
TRAY FOLEY W/BAG SLVR 14FR LF (SET/KITS/TRAYS/PACK) ×1 IMPLANT
TUBE NG 5FR 35IN ENFIT (TUBING) ×3 IMPLANT
TUNNELER SHEATH ON-Q 16GX12 DP (PAIN MANAGEMENT) IMPLANT
VASCULAR TIE MINI RED 18IN STL (MISCELLANEOUS) ×3 IMPLANT
YANKAUER SUCT BULB TIP NO VENT (SUCTIONS) ×2 IMPLANT

## 2024-07-22 NOTE — Progress Notes (Signed)
 [facility]  PROGRESS NOTE  ARTELIA GAME FMW:[id number] DOB: 1951-01-25 DOA: 07/04/2024 PCP: Paseda, Folashade R, FNP   Brief HPI/Hospital Course:     73 y.o. female with medical history significant of HLD, prediabetes, CKDII does not take any medication on regular basis,  past surgical h/o S/P total hysterectomy and BSO with  presents with vomiting x 1 month, can only hold down crackers and yogurt, has not had a full bowel movement in a few weeks, last vomited this morning, last bm with small pebbles this morning, +flatus, no ab pain, + weight loss of 10lbs in the last months.  Found to have gastric outlet obstruction related to a mass.    The patient underwent a Whipple procedure on 07/22/2024. The patient was seen in the PACU following the procedure. She has tolerated the procedure well.  Subjective   The patient is very somnolent and lying comfortably in the PACU.   Assessment/Plan:   Duodenal adenocarcinoma with gastric outlet obstruction CT scan shows dilated 1st and 2nd portion of duodenum with possible underlying mass. Patient was seen by gastroenterology and underwent small bowel enteroscopy.  The duodenal mass was biopsied.  Pathology is positive for adenocarcinoma. General surgery is following.  Patient has poor nutritional status.  Due to gastric outlet obstruction from the duodenal mass patient has been started on TPN.   Due to persistent nausea and vomiting NG tube was inserted.   Chloraseptic spray and viscous lidocaine  can be used for mouth/throat discomfort.   Surgery (Whipple procedure) was performed on 07/22/2024. The patient is anticipated to go to ICU following surgery.  Hypokalemia/hypophosphatemia Resolved. Nutrition to be provided by TPN as per surgery.   Essential hypertension The patient is currently normotensive.  Situational anxiety Ativan  as needed.  Hyperglycemia Was initially hypoglycemic.  Now noted to be hyperglycemic.  She is noted to be on every  4 hour coverage.    AKI on CKDII vs CKD IIIa Appears resolved. Creatinine on 9/9/20252 is 1.09.  Mild elevated lipase Resolved  DVT prophylaxis: SCDs Code Status: Full code Family Communication: family at bedside Disposition: Plan is for surgery 9/9 which will be done at [facility].  Disposition to be determined subsequently.    Medications    sodium chloride    Intravenous Once   [MAR Hold] Chlorhexidine  Gluconate Cloth  6 each Topical Daily   [MAR Hold] enoxaparin  (LOVENOX ) injection  40 mg Subcutaneous Daily   fentaNYL        [MAR Hold] fluticasone   2 spray Each Nare Daily   [MAR Hold] insulin  aspart  0-20 Units Subcutaneous Q8H   [MAR Hold] metoprolol  tartrate  5 mg Intravenous Q6H   midazolam        morphine    Intravenous Q4H   [MAR Hold] pantoprazole  (PROTONIX ) IV  40 mg Intravenous Q12H   [MAR Hold] pneumococcal 20-valent conjugate vaccine  0.5 mL Intramuscular Tomorrow-1000    Objective   Vitals:   07/22/24 1718 07/22/24 1730 07/22/24 1745 07/22/24 1800  BP: (!) 122/47 (!) 116/50 (!) 117/46 (!) 120/45  Pulse: 72 69 67 66  Resp: 20 19 20 15   Temp: 98.3 F (36.8 C)     TempSrc:      SpO2: 91% 94% 94% 95%  Weight:      Height:        Physical Examination:  Exam:  Constitutional:  The patient is somnolent. No acute distress. Respiratory:  No increased work of breathing. No wheezes, rales, or rhonchi No tactile fremitus Cardiovascular:  Regular rate and rhythm No murmurs, ectopy, or gallups. No lateral PMI. No thrills. Abdomen:  Abdomen is slightly distended.  Hypoactive bowel sounds.  Musculoskeletal:  No cyanosis, clubbing, or edema Skin:  No rashes, lesions, ulcers palpation of skin: no induration or nodules Neurologic:  CN 2-12 intact Sensation all 4 extremities intact Psychiatric:  Mental status Mood, affect appropriate Orientation to person, place, time  judgment and insight appear intact   Data Reviewed:  Basic Metabolic  Panel: Recent Labs  Lab 07/16/24 0129 07/17/24 0424 07/20/24 0326 07/21/24 0609 07/22/24 0350 07/22/24 1233 07/22/24 1353 07/22/24 1552  NA 142 144 143 139 140 140 140 141  K 4.2 4.2 3.8 3.8 3.7 4.1 4.3 4.6  CL 109 111 110 110 110  --   --   --   CO2 21* 23 23 24 24   --   --   --   GLUCOSE 347* 247* 145* 146* 115*  --   --   --   BUN 27* 27* 24* 23 21  --   --   --   CREATININE 1.00 0.91 0.91 1.07* 1.06*  --   --   --   CALCIUM  9.0 9.1 8.7* 8.5* 8.5*  --   --   --   MG 2.2 2.3  --  1.8 2.0  --   --   --   PHOS  --  3.1  --  3.9  --   --   --   --     CBC: Recent Labs  Lab 07/20/24 0326 07/22/24 0350 07/22/24 1233 07/22/24 1353 07/22/24 1552  WBC 6.1 6.1  --   --   --   NEUTROABS  --  3.7  --   --   --   HGB 10.8* 11.2* 9.5* 9.9* 9.9*  HCT 34.8* 35.1* 28.0* 29.0* 29.0*  MCV 96.1 95.4  --   --   --   PLT 224 241  --   --   --     LFT Recent Labs  Lab 07/17/24 0424 07/21/24 0609 07/22/24 0350  AST 21 22 22   ALT 36 37 35  ALKPHOS 82 82 82  BILITOT 0.5 0.5 0.6  PROT 6.5 4.6* 5.8*  ALBUMIN  3.2* 2.3* 2.4*     Margaretha Mahan   [facility] If 7PM-7AM, please contact night-coverage at [email or web address], Office  [phone]   07/22/2024, 6:28 PM  LOS: 18 days

## 2024-07-22 NOTE — Transfer of Care (Signed)
 Immediate Anesthesia Transfer of Care Note  Patient: Lauren Gray  Procedure(s) Performed: WHIPPLE PROCEDURE (Abdomen) CREATION, JEJUNOSTOMY (Abdomen)  Patient Location: PACU  Anesthesia Type:GA combined with regional for post-op pain  Level of Consciousness: awake, alert , and oriented  Airway & Oxygen Therapy: Patient Spontanous Breathing  Post-op Assessment: Report given to RN and Post -op Vital signs reviewed and stable  Post vital signs: Reviewed and stable  Last Vitals:  Vitals Value Taken Time  BP 110/49 07/22/24 17:20  Temp 98   Pulse 70 07/22/24 17:22  Resp 24 07/22/24 17:22  SpO2 91 % 07/22/24 17:22  Vitals shown include unfiled device data.  Last Pain:  Vitals:   07/22/24 0908  TempSrc: Oral  PainSc:       Patients Stated Pain Goal: 0 (07/18/24 1000)  Complications: No notable events documented.

## 2024-07-22 NOTE — Interval H&P Note (Signed)
 History and Physical Interval Note:  07/22/2024 9:09 AM  Lauren Gray  has presented today for surgery, with the diagnosis of DUODENAL CANCER.  The various methods of treatment have been discussed with the patient and family. After consideration of risks, benefits and other options for treatment, the patient has consented to  Procedure(s) with comments: WHIPPLE PROCEDURE (N/A) - DIAGNOSTIC LAPAROSCOPY, WHIPPLE VS GASTROJEJUNOSTOMY J TUBE CREATION, JEJUNOSTOMY (N/A) LAPAROSCOPY, DIAGNOSTIC (N/A) as a surgical intervention.  The patient's history has been reviewed, patient examined, no change in status, stable for surgery.  I have reviewed the patient's chart and labs.  Questions were answered to the patient's satisfaction.     Jina Nephew

## 2024-07-22 NOTE — Plan of Care (Signed)

## 2024-07-22 NOTE — Progress Notes (Addendum)
 Patient arrived to ICU  epidural CD, wound vac CDI, two JP drain to right abdomen, Picc line infusing TPN as ordered. Foley in place foley care completed peg tube at 10cm art line zeroed CHG completed mepilex applied to sacrum skin intact otherwise. MRSA nasal swab sent. Patient on 3L [place] Alert x 3 report given to Night RN

## 2024-07-22 NOTE — Anesthesia Procedure Notes (Signed)
 Procedure Name: Intubation Date/Time: 07/22/2024 10:15 AM  Performed by: Corinne Garnette BRAVO, MDPre-anesthesia Checklist: Patient identified, Emergency Drugs available, Suction available and Patient being monitored Patient Re-evaluated:Patient Re-evaluated prior to induction Oxygen Delivery Method: Circle System Utilized Preoxygenation: Pre-oxygenation with 100% oxygen Induction Type: IV induction Ventilation: Mask ventilation without difficulty Laryngoscope Size: Mac and 4 Grade View: Grade II Tube type: Oral Tube size: 7.0 mm Number of attempts: 2 Airway Equipment and Method: Stylet and Oral airway Placement Confirmation: ETT inserted through vocal cords under direct vision, positive ETCO2 and breath sounds checked- equal and bilateral Secured at: 23 cm Tube secured with: Tape Dental Injury: Teeth and Oropharynx as per pre-operative assessment  Comments: SRNA attempt DL w Mac 3, gr 3 view. MD DL with Mac 4. Atraumatic insertion. BS CTA B/L

## 2024-07-22 NOTE — Progress Notes (Signed)
 PHARMACY - TOTAL PARENTERAL NUTRITION CONSULT NOTE   Indication: Gastric outlet obstruction 2nd to mass   Patient Measurements: Height: 5' 8 (172.7 cm) Weight: 106 kg (233 lb 11 oz) IBW/kg (Calculated) : 63.9 TPN AdjBW (KG): 74.4 Body mass index is 35.53 kg/m. Usual Weight:   Assessment: Pharmacy consulted to manage TPN for nutrition support in [name] with gastric outlet obstruction 2nd mass.  PMH: HLD, prediabetes, CKDII does not take any medication on regular basis, past surgical h/o S/P total hysterectomy and BSO   Glucose / Insulin : hx of prediabetes (A1c 6.1 in Aug 2025), no DM medication PTA - had to transition cyclic TPN back to continuous d/t uncontrolled CBGs - CBGs <160; 6 units rSSI + 60 units insulin  R in TPN Electrolytes: Na 140, K 3.7, Cl 110, Ca 8.5 [CoCa 9.78], Phos 3.9, Mag 2.0 Renal: SCr 1.07; BUN 23 Hepatic: LFTs, Tbili stable WNL - albumin  2.3 - TG 71 I/O: - UOP 1+ unmeasured - LBM: 9/8 - NG 550 mL - mIVF: none GI Imaging:  - 8/22 CT A/P: SBO from mass - 8/25: AXR: duodenal stricture suspicious for mass & malignancy - 8/28 AXR: Nonobstructive bowel gas pattern.  GI Surgeries / Procedures:  - 8/23 SB enteroscopy: esophagitis; duodenal mass pathology shows invasive adenocarcinoma - 9/9: whipple procedure  Central access: PICC placed  8/26 for TPN TPN start date: 07/08/2024  Nutritional Goals: RD Assessment: Estimated Needs Total Energy Estimated Needs: 1700-1900 Total Protein Estimated Needs: 75-95 grams Total Fluid Estimated Needs: >/= 1.9 L   TPN @ 75 ml/hr provides 94 g protein & 1832 kcals daily  Current Nutrition:  NPO TPN  **TPN was cycled per request by [facility] in hopes that pt would be discharged home before surgery, however pt now to remain in hospital until surgery**  Plan:  Today, at 1800:  Continue TPN at 75 ml/hr Electrolytes in TPN: no changes Na 55 mEq/L  K 65 mEq/L  Ca 6 mEq/L  Mg 5 mEq/L  Phos 20 mmol/L Cl:Ac maximize  acetate salts Give 3 runs of K today Add standard MVI and trace elements to TPN Chromium withheld d/t critical shortage 60 units regular insulin  in TPN bag Continue resistant-scale SSI; can reduce to q8 hr CBG checks to minimize fingersticks mIVF per TRH - none currently Monitor TPN labs on Mon/Thurs, prn   Lauren Gray BS, PharmD, BCPS Clinical Pharmacist 07/22/2024 6:45 AM  Contact: [phone] after 3 PM

## 2024-07-22 NOTE — Anesthesia Procedure Notes (Signed)
 Arterial Line Insertion Start/End9/07/2024 9:17 AM, 07/22/2024 9:30 AM Performed by: Lamar Lucie DASEN, CRNA, CRNA  Patient location: Pre-op. Preanesthetic checklist: patient identified, IV checked, site marked, risks and benefits discussed, surgical consent, monitors and equipment checked, pre-op evaluation, timeout performed and anesthesia consent Lidocaine  1% used for infiltration and patient sedated Left, radial was placed Hand hygiene performed  Allen's test indicative of satisfactory collateral circulation Attempts: 2 Procedure performed without using ultrasound guided technique. Following insertion, dressing applied and Biopatch. Post procedure assessment: normal and unchanged  Patient tolerated the procedure well with no immediate complications.

## 2024-07-22 NOTE — Op Note (Signed)
 PREOPERATIVE DIAGNOSIS: Obstructing duodenal cancer  POSTOPERATIVE DIAGNOSIS: Same.   PROCEDURES PERFORMED:   Classic pancreaticoduodenectomy   Placement of pancreatic duct stent  18 French feeding jejunostomy tube  SURGEON: Jina Nephew, MD   ASSISTANT: Leonor Dawn, MD  ANESTHESIA: General and epidural   FINDINGS: 4 cm obstructing duodenal mass with mesenteric lymph nodes that felt firm and enlarged.  Mesenteric vessels umbilicated.  Soft pancreatic tissue.  7 mm common bile duct.  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  SPECIMENS:  1. Pancreaticoduodenectomy with gallbladder:  2. Portal nodes   ESTIMATED BLOOD LOSS: 500 mL.   COMPLICATIONS: None known.   PROCEDURE:   Pt was identified in the holding area and taken to  the operating room, and placed supine on the operating room  table. General anesthesia was induced. The patient's abdomen was  prepped and draped in a sterile fashion, after a Foley catheter was  placed. A time-out was performed according to the surgical safety check  list. When all was correct we continued.   A midline incision was made from the xiphoid to just above the umbilicus. The subcutaneous tissues were divided with the Bovie cautery. The peritoneum was entered in the center of the abdomen. Digital retraction was then used to elevate the preperitoneal fat, and this was taken with the cautery as well.  Care was taken to protect the underlying viscera.  The Bookwalter self-retaining retractor was placed to assist with visualization.  The lesser sac was entered.  The omentum was taken off of the colon going towards the left lateral abdomen.  It was freed up off of the anterior pancreas as well.  The duodenal mass did seem to tether in the lateral aspect of the SMA..  The anterior portion of the SMV was exposed and dissected.  This was  uninvolved with tumor.  The tumor was extensively kocherized posteriorly as well.  Once it appeared that we could get the tumor off, we proceeded with the remainder of the dissection.    The gallbladder was taken off the liver with a combination of blunt dissection and cautery. The cystic duct was clipped with the Hemalock clips. The cystic duct was divided and the gallbladder was passed off.   The common bile duct was skeletonized near the duodenum. A vessel loop was passed around it.  A Kelly clamp was passed underneath the pancreas at the superior mesenteric vein, and this passed easily with no signs of tumor involvement.  While dissecting out the porta, it became apparent that there was a variant arterial anatomy.  There was a small replaced left hepatic coming directly from the celiac trunk.  There was no common hepatic coming from the celiac.  The common hepatic appeared to go into the pancreas.  This was dissected free and eventually a gastroduodenal artery was found further posteriorly.  This was test clamped to make sure that there was still good flow in the right and left hepatic arteries.  This was able to be skeletonized, doubly tied, clipped, and suture-ligated.  Attention was then directed to the stomach, and the omentum was taken  off of the stomach at the border of the antrum and the body.  The gastroepiploic's were divided at the antrum with the harmonic scalpel.  The stomach was divided with the GIA-75 stapler. The border of the stomach was oversewn with a 3-0  running PDS suture.   Attention was then directed to the small bowel. Around 10 cm past the  ligament of Treitz was located, and this was divided with the 75-GIA.  The distal portion of the jejunum was also oversewn with a 3-0 PDS  suture. The fourth portion of the duodenum was skeletonized with the  harmonic scalpel, taking down all of the mesenteric vessels. The  ligament of Treitz was taken down. The IMV was preserved.  The  duodenum was then passed underneath the portal vein.   At this point the Burnard was replaced and the pancreas was divided with the cautery. 2-0 silk sutures were tied down and the inferior and superior border of the pancreas. The Bovie was used to coagulate the small bleeders at the border of the pancreas. The Overholt in combination with the harmonic and locking Weck clips  were then used to take the uncinate process off of the portal vein and  the superior mesenteric artery. Care was taken not to incorporate the  superior mesenteric artery in the dissection.  Several small venous branches were suture-ligated with 3-0 Prolene.  Unortunately, there were some firm lymph nodes that appeared to be adherent higher up toward the SMA.  It was not clear that this margin was free, but the dissection could not continue any higher on the superior mesenteric artery.    The jejunum was then passed underneath  the SMV, in order to get appropriate lie for the pancreatic and biliary  anastomoses. The more distal portion of the jejunum was pulled up over  the colon, and two 3-0 silks were placed through the posterior border  of the stomach for the gastrojejunostomy. The stomach and the small  bowel were opened, and a GIA-75 was used to create an end-to-end  anastomosis. The open areas of the staple line were examined to ensure  that there was hemostasis. The defect was then closed with a single  layer of running Connell suture of 3-0 PDS. Prior to a complete  closure, the NG tube was passed toward the afferent limb.   The appropriate location for the choledochojejunostomy was identified, and  the small bowel was opened approximately 8-9 mm. The anastamosis was created with approximately nine 4-0 interrupted PDS sutures.   The 2 corner sutures were placed first and then the posterior layer was done in an interrupted fashion tying on  the inside. The superior layer was then closed with interrupted sutures as   well.   The pancreatic anastomosis was then created. The pancreas was Soft, and the duct was 4 mm. It was posteriorly located.  A pediatric feeding tube was used as a pancreatic stent. The posterior layer was formed first with 2-0 silk sutures in interrupted fashion. Five 5-0 PDS sutures were used to create a duct-to-mucosa anastamosis.  The anterior layer was then oversewn with 2-0 silks to dunk the pancreatic parenchyma.   The areas were then irrigated and then those anastomoses were covered  with Vistaseal . This was allowed to dry. The abdomen was then irrigated  again and all the laparotomy sponges were removed. A lap count was  performed, which was correct. Two 19-Blake drains were placed, with the  lateral-most drain placed behind the choledochojejunostomy. The medial  Blake drain was placed just anterior and slightly superior to the  pancreaticojejunostomy.   A jejunostomy tube was placed in the left mid abdomen.  The 18 Fr replacement J tube was used.  It was passed through the abdominal  wall. A pursestring suture was placed in the jejunum.  The bowel was opened.  The tube was passed distally.  1.5 mL saline was placed into the balloon.  The pursestring suture was tied down.  Three Witzel sutures were placed.  The jejunum was then pexed to the abdominal wall with 2-0 silk sutures to minimize risk of volvulus.    The fascia was then closed with #1 looped running PDS sutures. The skin was irrigated and then closed with staples. The wounds were cleaned, dried and dressed with a Provena incisional wound vac.    The patient tolerated the procedure well and was extubated and taken to  PACU in stable condition. Needle and sponge counts were correct x2.

## 2024-07-22 NOTE — Anesthesia Procedure Notes (Signed)
 Central Venous Catheter Insertion Performed by: Corinne Garnette BRAVO, MD, anesthesiologist Start/End9/07/2024 9:17 AM, 07/22/2024 9:27 AM Patient location: Pre-op. Preanesthetic checklist: patient identified, IV checked, site marked, risks and benefits discussed, surgical consent, monitors and equipment checked, pre-op evaluation, timeout performed and anesthesia consent Position: Trendelenburg Lidocaine  1% used for infiltration and patient sedated Hand hygiene performed , maximum sterile barriers used  and Seldinger technique used Catheter size: 8.5 Fr Central line was placed.Sheath introducer Procedure performed using ultrasound guided technique. Ultrasound Notes:anatomy identified, needle tip was noted to be adjacent to the nerve/plexus identified, no ultrasound evidence of intravascular and/or intraneural injection and image(s) printed for medical record Attempts: 1 Following insertion, line sutured, dressing applied and Biopatch. Post procedure assessment: free fluid flow, blood return through all ports and no air  Patient tolerated the procedure well with no immediate complications.

## 2024-07-22 NOTE — Anesthesia Preprocedure Evaluation (Addendum)
 Anesthesia Evaluation  Patient identified by MRN, date of birth, ID band Patient awake    Reviewed: Allergy & Precautions, NPO status , Patient's Chart, lab work & pertinent test results  Airway Mallampati: II  TM Distance: >3 FB Neck ROM: Full    Dental  (+) Teeth Intact, Dental Advisory Given   Pulmonary neg pulmonary ROS   Pulmonary exam normal breath sounds clear to auscultation       Cardiovascular negative cardio ROS Normal cardiovascular exam Rhythm:Regular Rate:Normal     Neuro/Psych negative neurological ROS     GI/Hepatic Neg liver ROS,GERD  Medicated,,Duodenal adenocarcinoma with gastric outlet obstruction   Endo/Other  negative endocrine ROS  Obesity   Renal/GU Renal InsufficiencyRenal disease     Musculoskeletal Scoliosis    Abdominal   Peds  Hematology  (+) Blood dyscrasia, anemia   Anesthesia Other Findings Day of surgery medications reviewed with the patient.  Reproductive/Obstetrics  h/o S/P total hysterectomy and BSO                              Anesthesia Physical Anesthesia Plan  ASA: 4  Anesthesia Plan: General and Epidural   Post-op Pain Management: Ofirmev  IV (intra-op)*   Induction: Intravenous, Rapid sequence and Cricoid pressure planned  PONV Risk Score and Plan: 3 and Midazolam , Dexamethasone  and Ondansetron   Airway Management Planned: Oral ETT  Additional Equipment: Arterial line  Intra-op Plan:   Post-operative Plan: Extubation in OR  Informed Consent: I have reviewed the patients History and Physical, chart, labs and discussed the procedure including the risks, benefits and alternatives for the proposed anesthesia with the patient or authorized representative who has indicated his/her understanding and acceptance.     Dental advisory given  Plan Discussed with: CRNA  Anesthesia Plan Comments:          Anesthesia Quick  Evaluation

## 2024-07-22 NOTE — Progress Notes (Signed)
 NAME:  Lauren Gray, MRN:  [id number], DOB:  1951/01/21, LOS: 18 ADMISSION DATE:  07/04/2024, CONSULTATION DATE:  07/22/24 REFERRING MD:  TRH, CHIEF COMPLAINT:  s/p whipples   History of Present Illness:  74 yo female presented 18 days ago on [facility] service 2/2 gastric outlet obstruction with findings of underlying duodenal cancer. On original  presentation, pt complained of lack of PO intake + N/V that has been constant. Decreased BM but + flatus and also denied abdominal pain. Pt did endorse weightloss of ~10 pounds since June. Denies known sick contacts, no f/c. No sob/cp /dizziness. Denies etoh use or tobacco use.  Pt underwent whipple procedure today. She was brought to ICU but extubated and remains hemodynamically stable only requiring pca for pain control.   Ccm was asked to comment in light of her ICU presentation.  At this time pt has no acute ICU needs, but reasonable to monitor her closely since she was brought to ICU for BP changes, resp status changes in light of her recent procedure and need for pca. Etc. If remains off vasopressors ccm will sign off in am.  Updated daughter at bedside. Pt quite drowsy post operatively and unable to provide full ROS. Does endorse abdominal pain and nausea.   Pertinent  Medical History  Duodenal cancer Hyperlipidemia Pre diabetes ckd2  Significant Hospital Events: Including procedures, antibiotic start and stop dates in addition to other pertinent events   Admitted to hospital 8/22 Whipple procedure  9/9 Transferred to ICU 9/9  Interim History / Subjective:    Objective    Blood pressure (!) 130/50, pulse 72, temperature (!) 96.8 F (36 C), temperature source Axillary, resp. rate 16, height 5' 8 (1.727 m), weight 111.8 kg, SpO2 98%.        Intake/Output Summary (Last 24 hours) at 07/22/2024 2113 Last data filed at 07/22/2024 1838 Gross per 24 hour  Intake 5695.51 ml  Output 1375 ml  Net 4320.51 ml   Filed Weights   07/22/24 0826  07/22/24 0908 07/22/24 1907  Weight: 107 kg 107 kg 111.8 kg    Examination: General: somnolent but arousable. Reclining peacefully in bed  HEENT: NCAT, Eomi, perrla, sclera anicteric Lungs: ctab, diminished effort Cardiovascular: irreg irreg (all brady) Abdomen: bs absent, drains and    jtube in place, ttp with guarding mildly distended                                            Extremities: no c/c/e, moves all extremities Neuro: as above GU: deferred  Resolved problem list   Assessment and Plan  Acute post operative monitoring T2dm with hyperglycemia Chronic anemia Duodenal cancer Gastric outlet obstruction N/v -pt can remain on hospitalists service at this time. Should clinical status decompensate and req intubation or vasopressors then ccm can be reached back out to.      -supportive care -start tf when able per surgery with j tube placement to stop tpn asap                                            Labs   CBC: Recent Labs  Lab 07/20/24 0326 07/22/24 0350 07/22/24 1233 07/22/24 1353 07/22/24 1552  WBC 6.1 6.1  --   --   --  NEUTROABS  --  3.7  --   --   --   HGB 10.8* 11.2* 9.5* 9.9* 9.9*  HCT 34.8* 35.1* 28.0* 29.0* 29.0*  MCV 96.1 95.4  --   --   --   PLT 224 241  --   --   --     Basic Metabolic Panel: Recent Labs  Lab 07/16/24 0129 07/17/24 0424 07/20/24 0326 07/21/24 0609 07/22/24 0350 07/22/24 1233 07/22/24 1353 07/22/24 1552  NA 142 144 143 139 140 140 140 141  K 4.2 4.2 3.8 3.8 3.7 4.1 4.3 4.6  CL 109 111 110 110 110  --   --   --   CO2 21* 23 23 24 24   --   --   --   GLUCOSE 347* 247* 145* 146* 115*  --   --   --   BUN 27* 27* 24* 23 21  --   --   --   CREATININE 1.00 0.91 0.91 1.07* 1.06*  --   --   --   CALCIUM  9.0 9.1 8.7* 8.5* 8.5*  --   --   --   MG 2.2 2.3  --  1.8 2.0  --   --   --   PHOS  --  3.1  --  3.9  --   --   --   --    GFR: Estimated Creatinine Clearance: 62 mL/min (A) (by C-G formula based on SCr of 1.06 mg/dL  (H)). Recent Labs  Lab 07/20/24 0326 07/22/24 0350  WBC 6.1 6.1    Liver Function Tests: Recent Labs  Lab 07/17/24 0424 07/21/24 0609 07/22/24 0350  AST 21 22 22   ALT 36 37 35  ALKPHOS 82 82 82  BILITOT 0.5 0.5 0.6  PROT 6.5 4.6* 5.8*  ALBUMIN  3.2* 2.3* 2.4*   No results for input(s): LIPASE, AMYLASE in the last 168 hours. No results for input(s): AMMONIA in the last 168 hours.  ABG    Component Value Date/Time   PHART 7.327 (L) 07/22/2024 1552   PCO2ART 40.6 07/22/2024 1552   PO2ART 270 (H) 07/22/2024 1552   HCO3 21.3 07/22/2024 1552   TCO2 23 07/22/2024 1552   ACIDBASEDEF 4.0 (H) 07/22/2024 1552   O2SAT 100 07/22/2024 1552     Coagulation Profile: Recent Labs  Lab 07/22/24 0350  INR 1.0    Cardiac Enzymes: No results for input(s): CKTOTAL, CKMB, CKMBINDEX, TROPONINI in the last 168 hours.  HbA1C: Hgb A1c MFr Bld  Date/Time Value Ref Range Status  06/18/2024 02:45 PM 6.1 (H) 4.8 - 5.6 % Final    Comment:             Prediabetes: 5.7 - 6.4          Diabetes: >6.4          Glycemic control for adults with diabetes: <7.0   02/14/2023 09:28 AM 6.2 (H) 4.8 - 5.6 % Final    Comment:             Prediabetes: 5.7 - 6.4          Diabetes: >6.4          Glycemic control for adults with diabetes: <7.0     CBG: Recent Labs  Lab 07/21/24 0643 07/21/24 1419 07/21/24 2208 07/22/24 0546 07/22/24 0828  GLUCAP 142* 140* 130* 120* 150*    Review of Systems:   As per hpi  Past Medical History:  She,  has a past medical  history of Complication of anesthesia, Obesity, and Scoliosis.   Surgical History:   Past Surgical History:  Procedure Laterality Date   ABDOMINAL HYSTERECTOMY     TVH   BONE BIOPSY  07/06/2024   Procedure: BIOPSY, GI;  Surgeon: Elicia Claw, MD;  Location: [facility] ENDOSCOPY;  Service: Gastroenterology;;   ENTEROSCOPY N/A 07/06/2024   Procedure: ENTEROSCOPY;  Surgeon: Elicia Claw, MD;  Location: [facility] ENDOSCOPY;   Service: Gastroenterology;  Laterality: N/A;   PELVIC LAPAROSCOPY  04/06/2006   BSO     Social History:   reports that she has never smoked. She has never used smokeless tobacco. She reports that she does not currently use alcohol. She reports that she does not use drugs.   Family History:  Her family history includes Aneurysm in her sister; Breast cancer (age of onset: 46) in her sister; Cancer in her mother; Diabetes in her father and paternal grandmother; Heart disease in her father.   Allergies Allergies  Allergen Reactions   Codeine Anxiety    i don't feel right at all   Latex Rash     Home Medications  Prior to Admission medications   Medication Sig Start Date End Date Taking? Authorizing Provider  famotidine  (PEPCID ) 20 MG tablet Take 1 tablet (20 mg total) by mouth 2 (two) times daily as needed for heartburn or indigestion. 06/18/24  Yes Paseda, Folashade R, FNP  omeprazole (PRILOSEC) 40 MG capsule Take 40 mg by mouth daily. Half hour before morning meal 07/01/24  Yes [provider]  ondansetron  (ZOFRAN -ODT) 4 MG disintegrating tablet Take 1 tablet (4 mg total) by mouth every 8 (eight) hours as needed for nausea or vomiting. 06/23/24  Yes Paseda, Folashade R, FNP     Critical care time: 75

## 2024-07-22 NOTE — Anesthesia Procedure Notes (Signed)
 Epidural Patient location during procedure: pre-op Start time: 07/22/2024 9:35 AM End time: 07/22/2024 9:50 AM  Staffing Anesthesiologist: Corinne Garnette BRAVO, MD Performed: anesthesiologist   Preanesthetic Checklist Completed: patient identified, IV checked, risks and benefits discussed, monitors and equipment checked, pre-op evaluation and timeout performed  Epidural Patient position: sitting Prep: DuraPrep Patient monitoring: blood pressure and continuous pulse ox Approach: midline Location: thoracic (1-12) Injection technique: LOR air  Needle:  Needle type: Tuohy  Needle gauge: 17 G Needle length: 9 cm Needle insertion depth: 6 cm Catheter size: 19 Gauge Catheter at skin depth: 12 cm Test dose: negative and Other (1% Lidocaine )  Additional Notes Patient identified.  Risk benefits discussed including failed block, incomplete pain control, headache, nerve damage, paralysis, blood pressure changes, nausea, vomiting, reactions to medication both toxic or allergic, and postpartum back pain.  Patient expressed understanding and wished to proceed.  All questions were answered.  Sterile technique used throughout procedure and epidural site dressed with sterile barrier dressing. No paresthesia or other complications noted. The patient did not experience any signs of intravascular injection such as tinnitus or metallic taste in mouth nor signs of intrathecal spread such as rapid motor block. Please see nursing notes for vital signs. Reason for block:procedure for pain

## 2024-07-23 ENCOUNTER — Inpatient Hospital Stay (HOSPITAL_COMMUNITY): Payer: Self-pay | Admitting: Anesthesiology

## 2024-07-23 ENCOUNTER — Encounter (HOSPITAL_COMMUNITY): Payer: Self-pay | Admitting: General Surgery

## 2024-07-23 DIAGNOSIS — K5900 Constipation, unspecified: Secondary | ICD-10-CM | POA: Diagnosis not present

## 2024-07-23 DIAGNOSIS — R748 Abnormal levels of other serum enzymes: Secondary | ICD-10-CM | POA: Diagnosis not present

## 2024-07-23 DIAGNOSIS — K56609 Unspecified intestinal obstruction, unspecified as to partial versus complete obstruction: Secondary | ICD-10-CM | POA: Diagnosis not present

## 2024-07-23 DIAGNOSIS — N179 Acute kidney failure, unspecified: Secondary | ICD-10-CM | POA: Diagnosis not present

## 2024-07-23 LAB — GLUCOSE, CAPILLARY
Glucose-Capillary: 310 mg/dL — ABNORMAL HIGH (ref 70–99)
Glucose-Capillary: 325 mg/dL — ABNORMAL HIGH (ref 70–99)
Glucose-Capillary: 327 mg/dL — ABNORMAL HIGH (ref 70–99)
Glucose-Capillary: 337 mg/dL — ABNORMAL HIGH (ref 70–99)
Glucose-Capillary: 338 mg/dL — ABNORMAL HIGH (ref 70–99)
Glucose-Capillary: 338 mg/dL — ABNORMAL HIGH (ref 70–99)
Glucose-Capillary: 360 mg/dL — ABNORMAL HIGH (ref 70–99)

## 2024-07-23 LAB — CBC
HCT: 34.8 % — ABNORMAL LOW (ref 36.0–46.0)
HCT: 35.8 % — ABNORMAL LOW (ref 36.0–46.0)
Hemoglobin: 11.5 g/dL — ABNORMAL LOW (ref 12.0–15.0)
Hemoglobin: 11.5 g/dL — ABNORMAL LOW (ref 12.0–15.0)
MCH: 29.6 pg (ref 26.0–34.0)
MCH: 30.1 pg (ref 26.0–34.0)
MCHC: 32.1 g/dL (ref 30.0–36.0)
MCHC: 33 g/dL (ref 30.0–36.0)
MCV: 91.1 fL (ref 80.0–100.0)
MCV: 92 fL (ref 80.0–100.0)
Platelets: 191 K/uL (ref 150–400)
Platelets: 215 K/uL (ref 150–400)
RBC: 3.82 MIL/uL — ABNORMAL LOW (ref 3.87–5.11)
RBC: 3.89 MIL/uL (ref 3.87–5.11)
RDW: 15 % (ref 11.5–15.5)
RDW: 15 % (ref 11.5–15.5)
WBC: 12.6 K/uL — ABNORMAL HIGH (ref 4.0–10.5)
WBC: 13 K/uL — ABNORMAL HIGH (ref 4.0–10.5)
nRBC: 0 % (ref 0.0–0.2)
nRBC: 0 % (ref 0.0–0.2)

## 2024-07-23 LAB — COMPREHENSIVE METABOLIC PANEL WITH GFR
ALT: 40 U/L (ref 0–44)
ALT: 33 U/L (ref 0–44)
AST: 39 U/L (ref 15–41)
AST: 32 U/L (ref 15–41)
Albumin: 2.7 g/dL — ABNORMAL LOW (ref 3.5–5.0)
Albumin: 2.3 g/dL — ABNORMAL LOW (ref 3.5–5.0)
Alkaline Phosphatase: 54 U/L (ref 38–126)
Alkaline Phosphatase: 53 U/L (ref 38–126)
Anion gap: 9 (ref 5–15)
Anion gap: 6 (ref 5–15)
BUN: 29 mg/dL — ABNORMAL HIGH (ref 8–23)
BUN: 34 mg/dL — ABNORMAL HIGH (ref 8–23)
CO2: 20 mmol/L — ABNORMAL LOW (ref 22–32)
CO2: 22 mmol/L (ref 22–32)
Calcium: 7.9 mg/dL — ABNORMAL LOW (ref 8.9–10.3)
Calcium: 7.8 mg/dL — ABNORMAL LOW (ref 8.9–10.3)
Chloride: 107 mmol/L (ref 98–111)
Chloride: 107 mmol/L (ref 98–111)
Creatinine, Ser: 1.18 mg/dL — ABNORMAL HIGH (ref 0.44–1.00)
Creatinine, Ser: 1.57 mg/dL — ABNORMAL HIGH (ref 0.44–1.00)
GFR, Estimated: 49 mL/min — ABNORMAL LOW (ref >60)
GFR, Estimated: 35 mL/min — ABNORMAL LOW (ref >60)
Glucose, Bld: 361 mg/dL — ABNORMAL HIGH (ref 70–99)
Glucose, Bld: 379 mg/dL — ABNORMAL HIGH (ref 70–99)
Potassium: 5.1 mmol/L (ref 3.5–5.1)
Potassium: 5.3 mmol/L — ABNORMAL HIGH (ref 3.5–5.1)
Sodium: 136 mmol/L (ref 135–145)
Sodium: 135 mmol/L (ref 135–145)
Total Bilirubin: 3.5 mg/dL — ABNORMAL HIGH (ref 0.0–1.2)
Total Bilirubin: 2.8 mg/dL — ABNORMAL HIGH (ref 0.0–1.2)
Total Protein: 5 g/dL — ABNORMAL LOW (ref 6.5–8.1)
Total Protein: 4.6 g/dL — ABNORMAL LOW (ref 6.5–8.1)

## 2024-07-23 LAB — POCT I-STAT 7, (LYTES, BLD GAS, ICA,H+H)
Acid-base deficit: 3 mmol/L — ABNORMAL HIGH (ref 0.0–2.0)
Bicarbonate: 21.9 mmol/L (ref 20.0–28.0)
Calcium, Ion: 1.18 mmol/L (ref 1.15–1.40)
HCT: 28 % — ABNORMAL LOW (ref 36.0–46.0)
Hemoglobin: 9.5 g/dL — ABNORMAL LOW (ref 12.0–15.0)
O2 Saturation: 100 %
Potassium: 3.9 mmol/L (ref 3.5–5.1)
Sodium: 140 mmol/L (ref 135–145)
TCO2: 23 mmol/L (ref 22–32)
pCO2 arterial: 37.1 mmHg (ref 32–48)
pH, Arterial: 7.378 (ref 7.35–7.45)
pO2, Arterial: 294 mmHg — ABNORMAL HIGH (ref 83–108)

## 2024-07-23 LAB — CREATININE, SERUM
Creatinine, Ser: 1.14 mg/dL — ABNORMAL HIGH (ref 0.44–1.00)
GFR, Estimated: 51 mL/min — ABNORMAL LOW (ref >60)

## 2024-07-23 LAB — MAGNESIUM: Magnesium: 1.9 mg/dL (ref 1.7–2.4)

## 2024-07-23 LAB — PROTIME-INR
INR: 1.2 (ref 0.8–1.2)
Prothrombin Time: 16.2 s — ABNORMAL HIGH (ref 11.4–15.2)

## 2024-07-23 LAB — PHOSPHORUS: Phosphorus: 3.3 mg/dL (ref 2.5–4.6)

## 2024-07-23 MED ORDER — OSMOLITE 1.5 CAL PO LIQD
1000.0000 mL | ORAL | Status: DC
  Administered 2024-07-24 (×2): 1000 mL

## 2024-07-23 MED ORDER — INSULIN ASPART 100 UNIT/ML IJ SOLN
0.0000 [IU] | INTRAMUSCULAR | Status: DC
  Administered 2024-07-23: 15 [IU] via SUBCUTANEOUS
  Administered 2024-07-23: 20 [IU] via SUBCUTANEOUS
  Administered 2024-07-23 – 2024-07-24 (×5): 15 [IU] via SUBCUTANEOUS
  Administered 2024-07-24: 7 [IU] via SUBCUTANEOUS
  Administered 2024-07-24 (×2): 11 [IU] via SUBCUTANEOUS
  Administered 2024-07-25 (×3): 7 [IU] via SUBCUTANEOUS
  Administered 2024-07-25 – 2024-07-28 (×11): 4 [IU] via SUBCUTANEOUS
  Administered 2024-07-29: 7 [IU] via SUBCUTANEOUS
  Administered 2024-07-29: 4 [IU] via SUBCUTANEOUS
  Administered 2024-07-29 – 2024-07-30 (×6): 7 [IU] via SUBCUTANEOUS
  Administered 2024-07-30 (×3): 4 [IU] via SUBCUTANEOUS
  Administered 2024-07-30 – 2024-07-31 (×3): 7 [IU] via SUBCUTANEOUS
  Administered 2024-07-31: 11 [IU] via SUBCUTANEOUS
  Administered 2024-07-31: 4 [IU] via SUBCUTANEOUS
  Administered 2024-07-31: 7 [IU] via SUBCUTANEOUS
  Administered 2024-07-31 – 2024-08-01 (×3): 4 [IU] via SUBCUTANEOUS
  Administered 2024-08-01: 11 [IU] via SUBCUTANEOUS
  Administered 2024-08-01: 3 [IU] via SUBCUTANEOUS
  Administered 2024-08-01 – 2024-08-02 (×3): 4 [IU] via SUBCUTANEOUS
  Administered 2024-08-02: 7 [IU] via SUBCUTANEOUS
  Administered 2024-08-02: 4 [IU] via SUBCUTANEOUS
  Administered 2024-08-02: 11 [IU] via SUBCUTANEOUS
  Administered 2024-08-02 – 2024-08-03 (×4): 4 [IU] via SUBCUTANEOUS
  Administered 2024-08-03 (×2): 7 [IU] via SUBCUTANEOUS
  Administered 2024-08-03 – 2024-08-04 (×3): 4 [IU] via SUBCUTANEOUS
  Administered 2024-08-04: 3 [IU] via SUBCUTANEOUS
  Administered 2024-08-04 (×3): 4 [IU] via SUBCUTANEOUS
  Administered 2024-08-04: 3 [IU] via SUBCUTANEOUS
  Administered 2024-08-05: 4 [IU] via SUBCUTANEOUS
  Administered 2024-08-05: 3 [IU] via SUBCUTANEOUS
  Administered 2024-08-05: 4 [IU] via SUBCUTANEOUS
  Administered 2024-08-05: 7 [IU] via SUBCUTANEOUS
  Administered 2024-08-06: 3 [IU] via SUBCUTANEOUS
  Administered 2024-08-06 – 2024-08-07 (×5): 4 [IU] via SUBCUTANEOUS
  Administered 2024-08-07 – 2024-08-08 (×2): 3 [IU] via SUBCUTANEOUS
  Administered 2024-08-08: 4 [IU] via SUBCUTANEOUS
  Administered 2024-08-08: 7 [IU] via SUBCUTANEOUS
  Administered 2024-08-08: 4 [IU] via SUBCUTANEOUS

## 2024-07-23 MED ORDER — INSULIN GLARGINE 100 UNIT/ML ~~LOC~~ SOLN
10.0000 [IU] | Freq: Two times a day (BID) | SUBCUTANEOUS | Status: DC
  Administered 2024-07-23 – 2024-07-24 (×3): 10 [IU] via SUBCUTANEOUS
  Filled 2024-07-23 (×5): qty 0.1

## 2024-07-23 MED ORDER — HYDROMORPHONE HCL 1 MG/ML IJ SOLN
1.0000 mg | Freq: Once | INTRAMUSCULAR | Status: AC
  Administered 2024-07-23: 1 mg via INTRAVENOUS
  Filled 2024-07-23: qty 1

## 2024-07-23 MED ORDER — DEXTROSE-SODIUM CHLORIDE 5-0.45 % IV SOLN: INTRAVENOUS | Status: DC

## 2024-07-23 MED ORDER — TRAVASOL 10 % IV SOLN
INTRAVENOUS | Status: AC
  Filled 2024-07-23: qty 936

## 2024-07-23 MED ORDER — INSULIN ASPART 100 UNIT/ML IJ SOLN
0.0000 [IU] | INTRAMUSCULAR | Status: DC
  Administered 2024-07-23 (×3): 7 [IU] via SUBCUTANEOUS

## 2024-07-23 MED ORDER — METHOCARBAMOL 1000 MG/10ML IJ SOLN
500.0000 mg | Freq: Three times a day (TID) | INTRAMUSCULAR | Status: DC
  Administered 2024-07-23 – 2024-08-15 (×69): 500 mg via INTRAVENOUS
  Filled 2024-07-23 (×67): qty 10

## 2024-07-23 MED ORDER — MAGNESIUM SULFATE 2 GM/50ML IV SOLN
2.0000 g | Freq: Once | INTRAVENOUS | Status: AC
  Administered 2024-07-23: 2 g via INTRAVENOUS
  Filled 2024-07-23: qty 50

## 2024-07-23 MED ORDER — INSULIN GLARGINE 100 UNIT/ML ~~LOC~~ SOLN
10.0000 [IU] | Freq: Every day | SUBCUTANEOUS | Status: DC
Start: 2024-07-23 — End: 2024-07-23
  Administered 2024-07-23: 10 [IU] via SUBCUTANEOUS
  Filled 2024-07-23: qty 0.1

## 2024-07-23 NOTE — Progress Notes (Signed)
 Nutrition Follow-up  DOCUMENTATION CODES:   Obesity unspecified  INTERVENTION:   Continue TPN at goal rate until pt is able to demonstrate pt tolerating at least 60% of tube feeding goal.   Initiate trickle tube feeding via J-tube 9/11: Osmolite 1.5 at 20 ml/h    As able recommend advance to goal:   Osmolite 1.5 @ 50 ml/hr Prosource TF20 60 ml BID  Provides 1960 kcal, 115 gm protein, 912 ml free water  daily   NUTRITION DIAGNOSIS:   Inadequate oral intake related to nausea, vomiting (GOO r/t duodenal mass) as evidenced by per patient/family report. Ongoing.   GOAL:   Patient will meet greater than or equal to 90% of their needs Met with TPN at goal meeting nutrition needs  MONITOR:   Labs, Weight trends (TPN)  REASON FOR ASSESSMENT:   Consult New TPN/TNA  ASSESSMENT:   73 y.o. female with medical history significant of HLD, prediabetes, CKDII does not take any medication on regular basis,  past surgical h/o S/P total hysterectomy and BSO with  presents with vomiting x 1 month, can only hold down crackers and yogurt, has not had a full bowel movement in a few weeks, last vomited this morning, last bm with small pebbles this morning, +flatus, no ab pain, + weight loss of 10lbs in the last months.  Found to have gastric outlet obstruction related to a mass.  Pt admitted with GOO, per MD pt with obstructing duodenal cancer s/p Whipple with final path pending.   Per MD tomorrow 9/11 pt can start trickle TF, d/c NG, and start sips but expect slow advancement since pt had GOO for a while prior to surgery.     8/22: admitted [facility], NPO 8/23: FLD, s/p SB enteroscopy : reflux esophagitis, small hiatal hernia, duodenal mass, biopsy 8/24: CLD 8/25: CLD 8/26: TPN initiated via PICC 8/28: NPO, NGT placed to suction 9/07: Transfer to [facility] for Whipple 9/9 9/9 - s/p classic pancreaticoduodenectomy, placement of pancreatic duct stent, and 18 F J-tube   Medications reviewed and  include: SSI every 4 hours, 10 units lantus  daily, protonix  D5 1/2 NS @ 50 ml/hr 2 g mag sulfate x 1  TPN @ 75 ml/hr via PICC (started 8/26) Provides: 1832 kcal and 94 grams protein    Labs reviewed:  K 5.3 CBG's: 310-338 (prior to surgery 104-196)  UOP 950 ml  1 R JP: 225 ml  2 R JP: 15 ml I&O: + 4382 x 24 hrs   18 F NG tube 18 F J-tube   Diet Order:   Diet Order     None       EDUCATION NEEDS:   Education needs have been addressed  Skin:  Skin Assessment: Reviewed RN Assessment  Last BM:  9/2 last documented  Height:   Ht Readings from Last 1 Encounters:  07/22/24 5' 8 (1.727 m)    Weight:   Wt Readings from Last 1 Encounters:  07/23/24 113.7 kg    BMI:  Body mass index is 38.11 kg/m.  Estimated Nutritional Needs:   Kcal:  1900-2100  Protein:  90-110 grams  Fluid:  1.9L/day  Maybree Riling P., RD, LDN, CNSC See AMiON for contact information

## 2024-07-23 NOTE — Progress Notes (Signed)
 Butler Pinal, MD at bedside at [phone]. RN informed MD of pt poor pain control despite proper use of PCA pump. MD increased PCA rate to 12 mL/hr at bedside and gave a bolus as well. Pt expressed slight improvement with bolus and increase in rate.  07/23/2024 Charletta Bathe, RN, BSN

## 2024-07-23 NOTE — Progress Notes (Signed)
 [facility]  PROGRESS NOTE  Lauren Gray FMW:[id number] DOB: [birth year] DOA: 07/04/2024 PCP: Juanice Thomes SAUNDERS, FNP   Brief HPI:   73 yo female presented 18 days ago on [facility] service 2/2 gastric outlet obstruction with findings of underlying duodenal cancer. On original presentation, pt complained of lack of PO intake + N/V that has been constant. Decreased BM but + flatus and also denied abdominal pain. Pt did endorse weightloss of ~10 pounds since June. Denies known sick contacts, no f/c. No sob/cp /dizziness. Denies etoh use or tobacco use. Pt underwent whipple procedure today.  The patient underwent a Whipple procedure on 07/22/2024. The patient was seen in the PACU following the procedure. She has tolerated the procedure well    Assessment/Plan:    Duodenal adenocarcinoma with gastric outlet obstruction CT scan shows dilated 1st and 2nd portion of duodenum with possible underlying mass. Patient was seen by gastroenterology and underwent small bowel enteroscopy.  The duodenal mass was biopsied.  Pathology was positive for adenocarcinoma. General surgery was consulted .  Patient has poor nutritional status.  Due to gastric outlet obstruction from the duodenal mass patient   started on TPN.   Due to persistent nausea and vomiting NG tube was inserted.   Chloraseptic spray and viscous lidocaine  can be used for mouth/throat discomfort.   Surgery (Whipple procedure) was performed on 07/22/2024.  - Continue Dilaudid  PCA   Hypokalemia/hypophosphatemia Resolved. Nutrition to be provided by TPN as per surgery.   Essential hypertension The patient is currently normotensive.   Situational anxiety Ativan  as needed.   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 Creatinine on 9/9/20252 is 1.09.   Mild elevated lipase Resolved   Medications     sodium chloride     Intravenous Once   Chlorhexidine  Gluconate Cloth  6 each Topical Q0600   [START ON 07/24/2024] feeding supplement (VITAL HIGH PROTEIN)  1,000 mL Per Tube Q24H   fluticasone   2 spray Each Nare Daily   heparin  injection (subcutaneous)  5,000 Units Subcutaneous Q8H   insulin  aspart  0-9 Units Subcutaneous Q4H   metoprolol  tartrate  5 mg Intravenous Q6H   morphine    Intravenous Q4H   pantoprazole  (PROTONIX ) IV  40 mg Intravenous Q12H   pneumococcal 20-valent conjugate vaccine  0.5 mL Intramuscular Tomorrow-1000     Data Reviewed:   CBG:  Recent Labs  Lab 07/22/24 0828 07/22/24 2136 07/22/24 2342 07/23/24 0149 07/23/24 0343  GLUCAP 150* 314* 317* 338* 337*    SpO2: 97 % O2 Flow Rate (L/min): 3 L/min FiO2 (%): 30 %    Vitals:   07/23/24 0500 07/23/24 0530 07/23/24 0600 07/23/24 0630  BP: (!) 144/57 (!) 137/53 (!) 137/54 (!) 158/50  Pulse: 73 68 70 78  Resp: 19 (!) 9 (!) 7 (!) 8  Temp:      TempSrc:      SpO2: 97% 98% 97% 97%  Weight: 113.7 kg     Height:          Data Reviewed:  Basic Metabolic Panel: Recent Labs  Lab 07/17/24 0424 07/20/24 0326 07/21/24 0609 07/22/24 0350 07/22/24 1233 07/22/24 1353 07/22/24 1552 07/23/24 0000 07/23/24 0406  NA 144 143 139 140 140 140 141  --  136  K 4.2 3.8 3.8 3.7 4.1 4.3 4.6  --  5.1  CL 111 110 110 110  --   --   --   --  107  CO2 23 23 24 24   --   --   --   --  20*  GLUCOSE 247* 145* 146* 115*  --   --   --   --  361*  BUN 27* 24* 23 21  --   --   --   --  29*  CREATININE 0.91 0.91 1.07* 1.06*  --   --   --  1.14* 1.18*  CALCIUM  9.1 8.7* 8.5* 8.5*  --   --   --   --  7.9*  MG 2.3  --  1.8 2.0  --   --   --   --  1.9  PHOS 3.1  --  3.9  --   --   --   --   --  3.3    CBC: Recent Labs  Lab 07/20/24 0326 07/22/24 0350 07/22/24 1233 07/22/24 1353 07/22/24 1552 07/23/24 0000 07/23/24 0406  WBC 6.1 6.1  --   --   --  13.0* 12.6*  NEUTROABS  --  3.7  --   --   --   --   --   HGB 10.8* 11.2* 9.5* 9.9* 9.9*  11.5* 11.5*  HCT 34.8* 35.1* 28.0* 29.0* 29.0* 34.8* 35.8*  MCV 96.1 95.4  --   --   --  91.1 92.0  PLT 224 241  --   --   --  191 215    LFT Recent Labs  Lab 07/17/24 0424 07/21/24 0609 07/22/24 0350 07/23/24 0406  AST 21 22 22  39  ALT 36 37 35 40  ALKPHOS 82 82 82 54  BILITOT 0.5 0.5 0.6 3.5*  PROT 6.5 4.6* 5.8* 5.0*  ALBUMIN  3.2* 2.3* 2.4* 2.7*     Antibiotics: Anti-infectives (From admission, onward)    Start     Dose/Rate Route Frequency Ordered Stop   07/23/24 0000  ceFAZolin  (ANCEF ) IVPB 2g/100 mL premix        2 g 200 mL/hr over 30 Minutes Intravenous Every 8 hours 07/22/24 2314 07/23/24 0058   07/22/24 0900  ceFAZolin  (ANCEF ) IVPB 2g/100 mL premix        2 g 200 mL/hr over 30 Minutes Intravenous To ShortStay Surgical 07/21/24 1938 07/22/24 1432        DVT prophylaxis: Heparin   Code Status: Full code  Family Communication: Discussed with patient's family at bedside   CONSULTS General Surgery, gastroenterology   Subjective   Had Whipple procedure done yesterday.  Complains of pain.   Objective    Physical Examination:   Appears in mild distress S1-S2, regular Lungs clear to auscultation bilaterally Abdomen is soft, tenderness to palpation in right upper quadrant   Status is: Inpatient:             [name]   [facility] If 7PM-7AM, please contact night-coverage at [email or web address], Office  [phone]   07/23/2024, 7:43 AM  LOS: 19 days

## 2024-07-23 NOTE — Progress Notes (Signed)
 eLink Physician-Brief Progress Note Patient Name: Lauren Gray DOB: May 12, 1951 MRN: [id number]   Date of Service  07/23/2024  HPI/Events of Note  Patient admitted to the ICU for close monitoring following Whipple procedure for duodenal cancer.  eICU Interventions  New Patient Evaluation.        [name] U [name] 07/23/2024, 12:49 AM

## 2024-07-23 NOTE — Plan of Care (Signed)
   Problem: Coping: Goal: Level of anxiety will decrease Outcome: Progressing   Problem: Pain Managment: Goal: General experience of comfort will improve and/or be controlled Outcome: Progressing   Problem: Safety: Goal: Ability to remain free from injury will improve Outcome: Progressing

## 2024-07-23 NOTE — Anesthesia Postprocedure Evaluation (Signed)
 Anesthesia Post Note  Patient: Lauren Gray  Procedure(s) Performed: WHIPPLE PROCEDURE (Abdomen) CREATION, JEJUNOSTOMY (Abdomen)     Patient location during evaluation: ICU Anesthesia Type: General Level of consciousness: awake and alert Pain management: pain level controlled Vital Signs Assessment: post-procedure vital signs reviewed and stable Respiratory status: spontaneous breathing, nonlabored ventilation and respiratory function stable Cardiovascular status: blood pressure returned to baseline and stable Postop Assessment: no apparent nausea or vomiting Anesthetic complications: no   No notable events documented.  Last Vitals:  Vitals:   07/23/24 0600 07/23/24 0630  BP: (!) 137/54 (!) 158/50  Pulse: 70 78  Resp: (!) 7 (!) 8  Temp:    SpO2: 97% 97%    Last Pain:  Vitals:   07/23/24 9366  TempSrc:   PainSc: 5    Pain Goal: Patients Stated Pain Goal: 0 (07/18/24 1000)                 Garnette FORBES Skillern

## 2024-07-23 NOTE — Progress Notes (Signed)
 1 Day Post-Op   Subjective/Chief Complaint: Main issues this AM is pain.  Had epidural turned up early AM today.     Objective: Vital signs in last 24 hours: Temp:  [96.8 F (36 C)-98.4 F (36.9 C)] 98.4 F (36.9 C) (09/10 0700) Pulse Rate:  [66-86] 75 (09/10 0700) Resp:  [7-24] 10 (09/10 0900) BP: (116-162)/(45-148) 158/64 (09/10 0700) SpO2:  [91 %-99 %] 97 % (09/10 0900) Arterial Line BP: (67-193)/(39-71) 184/71 (09/09 2130) FiO2 (%):  [30 %] 30 % (09/10 0348) Weight:  [111.8 kg-113.7 kg] 113.7 kg (09/10 0500) Last BM Date : 07/18/24  Intake/Output from previous day: 09/09 0701 - 09/10 0700 In: 6342.2 [I.V.:3842.1; Blood:630; IV Piggyback:1813] Out: 1960 [Urine:950; Emesis/NG output:20; Drains:240; Blood:750] Intake/Output this shift: No intake/output data recorded.  General:  looks uncomfortable Resp:  unlabored, normal rate CV:  RR&R Abd:  soft, non distended, incisional wound vac in place.  JPs serosang.  J tube in place.  NGT bloody/bilious.   Ext:  SCDs in place  Lab Results:  Recent Labs    07/23/24 0000 07/23/24 0406  WBC 13.0* 12.6*  HGB 11.5* 11.5*  HCT 34.8* 35.8*  PLT 191 215   BMET Recent Labs    07/22/24 0350 07/22/24 1233 07/22/24 1552 07/23/24 0000 07/23/24 0406  NA 140   < > 141  --  136  K 3.7   < > 4.6  --  5.1  CL 110  --   --   --  107  CO2 24  --   --   --  20*  GLUCOSE 115*  --   --   --  361*  BUN 21  --   --   --  29*  CREATININE 1.06*  --   --  1.14* 1.18*  CALCIUM  8.5*  --   --   --  7.9*   < > = values in this interval not displayed.   PT/INR Recent Labs    07/22/24 0350 07/23/24 0406  LABPROT 13.9 16.2*  INR 1.0 1.2   ABG Recent Labs    07/22/24 1353 07/22/24 1552  PHART 7.375 7.327*  HCO3 22.6 21.3    Studies/Results: No results found.  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  Await final path.   Strict NPO/NGT until tomorrow Anesthesia giving epidural bolus and increasing rate Switch PCA morphine  to dilaudid  Change robaxin  to scheduled. LFTs with bump in bilirubin not unexpected.  Will follow.   Defer management of blood sugar to medicine.  Pt had generous panc head and uncinate process so lost a good amount of functional pancreas.    Anesthesia OK'd sq heparin  for vte ppx as long as platelets are OK.   TPN until patient tolerating J tube feeds.  Plan trickle feeds to start tomorrow  Will plan d/c ngt and start sips tomorrow.  Patient had  GOO for a while, so would not expect her stomach to function normally for a while  plan slow diet advance.         LOS: 19 days    Lauren Gray 07/23/2024

## 2024-07-23 NOTE — Progress Notes (Signed)
 PHARMACY - TOTAL PARENTERAL NUTRITION CONSULT NOTE   Indication: Gastric outlet obstruction 2nd to mass   Patient Measurements: Height: 5' 8 (172.7 cm) Weight: 113.7 kg (250 lb 10.6 oz) IBW/kg (Calculated) : 63.9 TPN AdjBW (KG): 74.7 Body mass index is 38.11 kg/m. Usual Weight:   9/9 AM update: R-iSS was dc'd overnight and changed to sensitive scale for unknown reasons s/p pancreaticoduodenectomy. Will restart Resistant scale and change CBG monitoring q8h >> q4h for tighter glucose control  Assessment: Pharmacy consulted to manage TPN for nutrition support in [name] with gastric outlet obstruction 2nd mass.  PMH: HLD, prediabetes, CKDII does not take any medication on regular basis, past surgical h/o S/P total hysterectomy and BSO   Glucose / Insulin : hx of prediabetes (A1c 6.1 in Aug 2025), no DM medication PTA - had to transition cyclic TPN back to continuous d/t uncontrolled CBGs - CBGs 115-361; 29 units rSSI + 60 units insulin  R in TPN  Dexamethasone  5 mg given 9/9 Electrolytes: Na 136, K 5.1, Cl 107, Ca 7.9 [CoCa 8.94], Phos 3.3, Mag 1.9 Renal: SCr 1.18; BUN 29 Hepatic: LFTs wnl, Tbili 3.5 - albumin  2.7 - TG 71 I/O: - UOP 0.35 ml/kg/hr - LBM: 9/8 - NG 20 mL - mIVF: none - drains 240 mL - EBL 750 mL GI Imaging:  - 8/22 CT A/P: SBO from mass - 8/25: AXR: duodenal stricture suspicious for mass & malignancy - 8/28 AXR: Nonobstructive bowel gas pattern.  GI Surgeries / Procedures:  - 8/23 SB enteroscopy: esophagitis; duodenal mass pathology shows invasive adenocarcinoma - 9/9: whipple procedure  Central access: PICC placed  8/26 for TPN TPN start date: 07/08/2024  Nutritional Goals: RD Assessment: Estimated Needs Total Energy Estimated Needs: 1700-1900 Total Protein Estimated Needs: 75-95 grams Total Fluid Estimated Needs: >/= 1.9 L   TPN @ 75 ml/hr provides 94 g protein & 1832 kcals daily  Current Nutrition:  NPO TPN  **TPN was cycled per request by [facility]  in hopes that pt would be discharged home before surgery, however pt now to remain in hospital until surgery**  Plan:  Today, at 1800:  Continue TPN at 75 ml/hr Electrolytes in TPN: no changes Na 60 mEq/L  K 60 mEq/L  Ca 7 mEq/L  Mg 7 mEq/L  Phos 25 mmol/L Cl:Ac maximize acetate salts Give 2 g mag iv x1 today Add standard MVI and trace elements to TPN Chromium withheld d/t critical shortage 60 units regular insulin  in TPN bag Restart resistant-scale SSI; CBG checks q4h  mIVF per TRH - none currently Monitor TPN labs on Mon/Thurs, prn Planned trickle feeds to start 9/11   Chong January BS, PharmD, BCPS Clinical Pharmacist 07/23/2024 10:26 AM  Contact: [phone] after 3 PM

## 2024-07-23 NOTE — Progress Notes (Signed)
 PHARMACY - TOTAL PARENTERAL NUTRITION CONSULT NOTE   Indication: Gastric outlet obstruction 2nd to mass   Patient Measurements: Height: 5' 8 (172.7 cm) Weight: 113.7 kg (250 lb 10.6 oz) IBW/kg (Calculated) : 63.9 TPN AdjBW (KG): 74.7 Body mass index is 38.11 kg/m. Usual Weight:   Assessment: Pharmacy consulted to manage TPN for nutrition support in [name] with gastric outlet obstruction 2nd mass.  PMH: HLD, prediabetes, CKDII does not take any medication on regular basis, past surgical h/o S/P total hysterectomy and BSO   Glucose / Insulin : hx of prediabetes (A1c 6.1 in Aug 2025), no DM medication PTA - had to transition cyclic TPN back to continuous d/t uncontrolled CBGs - CBGs 115-361; 29 units rSSI + 60 units insulin  R in TPN  Dexamethasone  5 mg given 9/9 Electrolytes: Na 136, K 5.1, Cl 107, Ca 7.9 [CoCa 8.94], Phos 3.3, Mag 1.9 Renal: SCr 1.18; BUN 29 Hepatic: LFTs wnl, Tbili 3.5 - albumin  2.7 - TG 71 I/O: - UOP 0.35 ml/kg/hr - LBM: 9/8 - NG 20 mL - mIVF: none - drains 240 mL - EBL 750 mL GI Imaging:  - 8/22 CT A/P: SBO from mass - 8/25: AXR: duodenal stricture suspicious for mass & malignancy - 8/28 AXR: Nonobstructive bowel gas pattern.  GI Surgeries / Procedures:  - 8/23 SB enteroscopy: esophagitis; duodenal mass pathology shows invasive adenocarcinoma - 9/9: whipple procedure  Central access: PICC placed  8/26 for TPN TPN start date: 07/08/2024  Nutritional Goals: RD Assessment: Estimated Needs Total Energy Estimated Needs: 1700-1900 Total Protein Estimated Needs: 75-95 grams Total Fluid Estimated Needs: >/= 1.9 L   TPN @ 75 ml/hr provides 94 g protein & 1832 kcals daily  Current Nutrition:  NPO TPN  **TPN was cycled per request by [facility] in hopes that pt would be discharged home before surgery, however pt now to remain in hospital until surgery**  Plan:  Today, at 1800:  Continue TPN at 75 ml/hr Electrolytes in TPN: no changes Na 60 mEq/L  K 60  mEq/L  Ca 7 mEq/L  Mg 7 mEq/L  Phos 25 mmol/L Cl:Ac maximize acetate salts Give 2 g mag iv x1 today Add standard MVI and trace elements to TPN Chromium withheld d/t critical shortage 60 units regular insulin  in TPN bag Continue resistant-scale SSI; can reduce to q8 hr CBG checks to minimize fingersticks mIVF per TRH - none currently Monitor TPN labs on Mon/Thurs, prn   Shaylah Mcghie BS, PharmD, BCPS Clinical Pharmacist 07/23/2024 7:11 AM  Contact: [phone] after 3 PM

## 2024-07-23 NOTE — Anesthesia Post-op Follow-up Note (Signed)
  Anesthesia Pain Follow-up Note  Patient: Lauren Gray  Day #: 1  Date of Follow-up: 07/23/2024 Time: 11:43 AM  Last Vitals:  Vitals:   07/23/24 0700 07/23/24 0900  BP: (!) 158/64   Pulse: 75   Resp: (!) 7 10  Temp: 36.9 C   SpO2: 97% 97%    Level of Consciousness: alert  Pain: moderate   Side Effects:None  Catheter Site Exam:clean  Anti-Coag Meds (From admission, onward)    Start     Dose/Rate Route Frequency Ordered Stop   07/23/24 0600  heparin  injection 5,000 Units       Note to Pharmacy: Discussed with anesthesia.  Ok if platelets are ok.   5,000 Units Subcutaneous Every 8 hours 07/22/24 2314        Epidural / Intrathecal (From admission, onward)    Start     Dose/Rate Route Frequency Ordered Stop   07/23/24 0000  ropivacaine  (PF) 2 mg/mL (0.2%) (NAROPIN ) injection        10 mL/hr 10 mL/hr  Epidural Continuous 07/22/24 2313          Plan: Modify therapy to improve pain control at surgeon's request  Butler Levander Pinal   Epidural infusion increased from 10ml/hr to 12ml/hr to help with pain control

## 2024-07-23 NOTE — Progress Notes (Signed)
 Called by the ICU nurse that she had an order to increase the epidural infusion from 8cc/hr to 10cc/hr but she needed an anesthesiologist to change the infusion rate. I went to the ICU and increased the rate to 10cc/hr.

## 2024-07-24 ENCOUNTER — Inpatient Hospital Stay (HOSPITAL_COMMUNITY)

## 2024-07-24 DIAGNOSIS — R748 Abnormal levels of other serum enzymes: Secondary | ICD-10-CM | POA: Diagnosis not present

## 2024-07-24 DIAGNOSIS — N179 Acute kidney failure, unspecified: Secondary | ICD-10-CM | POA: Diagnosis not present

## 2024-07-24 DIAGNOSIS — K5900 Constipation, unspecified: Secondary | ICD-10-CM | POA: Diagnosis not present

## 2024-07-24 DIAGNOSIS — C170 Malignant neoplasm of duodenum: Secondary | ICD-10-CM | POA: Diagnosis not present

## 2024-07-24 DIAGNOSIS — K56609 Unspecified intestinal obstruction, unspecified as to partial versus complete obstruction: Secondary | ICD-10-CM | POA: Diagnosis not present

## 2024-07-24 LAB — COMPREHENSIVE METABOLIC PANEL WITH GFR
ALT: 27 U/L (ref 0–44)
AST: 33 U/L (ref 15–41)
Albumin: 2.5 g/dL — ABNORMAL LOW (ref 3.5–5.0)
Alkaline Phosphatase: 63 U/L (ref 38–126)
Anion gap: 9 (ref 5–15)
BUN: 40 mg/dL — ABNORMAL HIGH (ref 8–23)
CO2: 22 mmol/L (ref 22–32)
Calcium: 8.1 mg/dL — ABNORMAL LOW (ref 8.9–10.3)
Chloride: 103 mmol/L (ref 98–111)
Creatinine, Ser: 1.38 mg/dL — ABNORMAL HIGH (ref 0.44–1.00)
GFR, Estimated: 40 mL/min — ABNORMAL LOW (ref >60)
Glucose, Bld: 344 mg/dL — ABNORMAL HIGH (ref 70–99)
Potassium: 4.9 mmol/L (ref 3.5–5.1)
Sodium: 134 mmol/L — ABNORMAL LOW (ref 135–145)
Total Bilirubin: 2.6 mg/dL — ABNORMAL HIGH (ref 0.0–1.2)
Total Protein: 5.4 g/dL — ABNORMAL LOW (ref 6.5–8.1)

## 2024-07-24 LAB — CBC
HCT: 36.2 % (ref 36.0–46.0)
Hemoglobin: 11.7 g/dL — ABNORMAL LOW (ref 12.0–15.0)
MCH: 29.8 pg (ref 26.0–34.0)
MCHC: 32.3 g/dL (ref 30.0–36.0)
MCV: 92.1 fL (ref 80.0–100.0)
Platelets: 201 K/uL (ref 150–400)
RBC: 3.93 MIL/uL (ref 3.87–5.11)
RDW: 14.9 % (ref 11.5–15.5)
WBC: 19 K/uL — ABNORMAL HIGH (ref 4.0–10.5)
nRBC: 0 % (ref 0.0–0.2)

## 2024-07-24 LAB — SURGICAL PATHOLOGY

## 2024-07-24 LAB — PHOSPHORUS: Phosphorus: 2.4 mg/dL — ABNORMAL LOW (ref 2.5–4.6)

## 2024-07-24 LAB — GLUCOSE, CAPILLARY
Glucose-Capillary: 327 mg/dL — ABNORMAL HIGH (ref 70–99)
Glucose-Capillary: 292 mg/dL — ABNORMAL HIGH (ref 70–99)
Glucose-Capillary: 305 mg/dL — ABNORMAL HIGH (ref 70–99)
Glucose-Capillary: 334 mg/dL — ABNORMAL HIGH (ref 70–99)
Glucose-Capillary: 253 mg/dL — ABNORMAL HIGH (ref 70–99)
Glucose-Capillary: 232 mg/dL — ABNORMAL HIGH (ref 70–99)

## 2024-07-24 LAB — TROPONIN I (HIGH SENSITIVITY): Troponin I (High Sensitivity): 13 ng/L (ref <18)

## 2024-07-24 LAB — MAGNESIUM: Magnesium: 2.3 mg/dL (ref 1.7–2.4)

## 2024-07-24 MED ORDER — SODIUM PHOSPHATES 45 MMOLE/15ML IV SOLN
15.0000 mmol | Freq: Once | INTRAVENOUS | Status: AC
  Administered 2024-07-24: 15 mmol via INTRAVENOUS
  Filled 2024-07-24: qty 5

## 2024-07-24 MED ORDER — HYDROMORPHONE 1 MG/ML IV SOLN: INTRAVENOUS | Status: DC

## 2024-07-24 MED ORDER — HYDROMORPHONE HCL 1 MG/ML IJ SOLN: 0.5000 mg | INTRAMUSCULAR | Status: DC | PRN

## 2024-07-24 MED ORDER — TRAVASOL 10 % IV SOLN: INTRAVENOUS | Status: DC

## 2024-07-24 MED ORDER — TRAVASOL 10 % IV SOLN
INTRAVENOUS | Status: AC
  Filled 2024-07-24: qty 954

## 2024-07-24 MED ORDER — TRAMADOL HCL 50 MG PO TABS
50.0000 mg | ORAL_TABLET | Freq: Four times a day (QID) | ORAL | Status: DC | PRN
  Administered 2024-07-24 – 2024-07-27 (×8): 50 mg via ORAL
  Filled 2024-07-24 (×8): qty 1

## 2024-07-24 MED ORDER — GABAPENTIN 100 MG PO CAPS
100.0000 mg | ORAL_CAPSULE | Freq: Three times a day (TID) | ORAL | Status: DC
  Administered 2024-07-24 – 2024-07-27 (×11): 100 mg via ORAL
  Filled 2024-07-24 (×11): qty 1

## 2024-07-24 MED ORDER — ACETAMINOPHEN 10 MG/ML IV SOLN
1000.0000 mg | Freq: Four times a day (QID) | INTRAVENOUS | Status: AC
  Administered 2024-07-24 – 2024-07-25 (×4): 1000 mg via INTRAVENOUS
  Filled 2024-07-24 (×4): qty 100

## 2024-07-24 MED ORDER — HYDRALAZINE HCL 20 MG/ML IJ SOLN: 10.0000 mg | Freq: Four times a day (QID) | INTRAMUSCULAR | Status: DC | PRN

## 2024-07-24 MED ORDER — LORAZEPAM 0.5 MG PO TABS: 0.5000 mg | ORAL_TABLET | Freq: Once | ORAL | Status: DC

## 2024-07-24 MED ORDER — HYDROMORPHONE HCL 1 MG/ML IJ SOLN
0.5000 mg | INTRAMUSCULAR | Status: DC | PRN
  Administered 2024-07-24: 0.5 mg via INTRAVENOUS
  Filled 2024-07-24: qty 1

## 2024-07-24 MED ORDER — HYDRALAZINE HCL 20 MG/ML IJ SOLN
10.0000 mg | Freq: Four times a day (QID) | INTRAMUSCULAR | Status: DC | PRN
  Administered 2024-07-24 – 2024-07-27 (×8): 10 mg via INTRAVENOUS
  Filled 2024-07-24 (×7): qty 1

## 2024-07-24 MED ORDER — SODIUM CHLORIDE 0.9% FLUSH: 9.0000 mL | INTRAVENOUS | Status: DC | PRN

## 2024-07-24 MED ORDER — SODIUM CHLORIDE 0.9 % IV SOLN
INTRAVENOUS | Status: AC
Start: 2024-07-24 — End: 2024-07-25

## 2024-07-24 MED ORDER — NALOXONE HCL 0.4 MG/ML IJ SOLN: 0.4000 mg | INTRAMUSCULAR | Status: DC | PRN

## 2024-07-24 MED ORDER — HYDRALAZINE HCL 20 MG/ML IJ SOLN
10.0000 mg | Freq: Once | INTRAMUSCULAR | Status: AC
  Administered 2024-07-24: 10 mg via INTRAVENOUS
  Filled 2024-07-24: qty 1

## 2024-07-24 NOTE — Plan of Care (Signed)
  Problem: Activity: Goal: Risk for activity intolerance will decrease Outcome: Progressing   Problem: Nutrition: Goal: Adequate nutrition will be maintained Outcome: Progressing   Problem: Pain Managment: Goal: General experience of comfort will improve and/or be controlled Outcome: Progressing   Problem: Safety: Goal: Ability to remain free from injury will improve Outcome: Progressing   Problem: Tissue Perfusion: Goal: Adequacy of tissue perfusion will improve Outcome: Progressing

## 2024-07-24 NOTE — Progress Notes (Addendum)
 [facility]  PROGRESS NOTE  Lauren Gray FMW:[id number] DOB: Jan 10, 1951 DOA: 07/04/2024 PCP: Juanice Thomes SAUNDERS, FNP   Brief HPI:   73 yo female presented 18 days ago on [facility] service 2/2 gastric outlet obstruction with findings of underlying duodenal cancer. On original presentation, pt complained of lack of PO intake + N/V that has been constant. Decreased BM but + flatus and also denied abdominal pain. Pt did endorse weightloss of ~10 pounds since June. Denies known sick contacts, no f/c. No sob/cp /dizziness. Denies etoh use or tobacco use. Pt underwent whipple procedure today.  The patient underwent a Whipple procedure on 07/22/2024. The patient was seen in the PACU following the procedure. She has tolerated the procedure well    Assessment/Plan:    Duodenal adenocarcinoma with gastric outlet obstruction CT scan showed dilated 1st and 2nd portion of duodenum with possible underlying mass. Patient was seen by gastroenterology and underwent small bowel enteroscopy.  The duodenal mass was biopsied.  Pathology was positive for adenocarcinoma. General surgery was consulted .  Patient has poor nutritional status.  Due to gastric outlet obstruction from the duodenal mass patient   started on TPN.   Due to persistent nausea and vomiting NG tube was inserted.   Chloraseptic spray and viscous lidocaine  can be used for mouth/throat discomfort.   Surgery (Whipple procedure) was performed on 07/22/2024.  - Continue Dilaudid  PCA -Patient is on postoperative epidural with ropivacaine , anesthesiology following - Discussed with family determined that patient will need IV Dilaudid  or morphine  as pain will not be able to controlled with epidural anesthesia.  Family would like to discuss with Dr. Aron.     Hypokalemia/hypophosphatemia Resolved. Nutrition to be provided by TPN as per surgery.   Essential hypertension The patient is currently normotensive.   Situational anxiety Ativan  as needed.    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   Mild elevated lipase Resolved   Medications     sodium chloride    Intravenous Once   Chlorhexidine  Gluconate Cloth  6 each Topical Q0600   fluticasone   2 spray Each Nare Daily   gabapentin   100 mg Oral TID   heparin  injection (subcutaneous)  5,000 Units Subcutaneous Q8H   HYDROmorphone    Intravenous Q4H   insulin  aspart  0-20 Units Subcutaneous Q4H   insulin  glargine  10 Units Subcutaneous BID   methocarbamol  (ROBAXIN ) injection  500 mg Intravenous Q8H   metoprolol  tartrate  5 mg Intravenous Q6H   pantoprazole  (PROTONIX ) IV  40 mg Intravenous Q12H     Data Reviewed:   CBG:  Recent Labs  Lab 07/23/24 2005 07/23/24 2344 07/24/24 0404 07/24/24 0712 07/24/24 1111  GLUCAP 327* 338* 327* 292* 305*    SpO2: 93 % O2 Flow Rate (L/min): 3 L/min FiO2 (%): 30 %    Vitals:   07/24/24 1151 07/24/24 1200 07/24/24 1213 07/24/24 1300  BP:  (!) 182/60  (!) 170/55  Pulse:  (!) 110 94 96  Resp:  (!) 29 (!) 31 (!) 32  Temp: 99.4 F (37.4 C)     TempSrc: Axillary     SpO2:  94% 92% 93%  Weight:  Height:          Data Reviewed:  Basic Metabolic Panel: Recent Labs  Lab 07/21/24 0609 07/22/24 0350 07/22/24 1118 07/22/24 1353 07/22/24 1552 07/23/24 0000 07/23/24 0406 07/23/24 1232 07/24/24 0412  NA 139 140   < > 140 141  --  136 135 134*  K 3.8 3.7   < > 4.3 4.6  --  5.1 5.3* 4.9  CL 110 110  --   --   --   --  107 107 103  CO2 24 24  --   --   --   --  20* 22 22  GLUCOSE 146* 115*  --   --   --   --  361* 379* 344*  BUN 23 21  --   --   --   --  29* 34* 40*   CREATININE 1.07* 1.06*  --   --   --  1.14* 1.18* 1.57* 1.38*  CALCIUM  8.5* 8.5*  --   --   --   --  7.9* 7.8* 8.1*  MG 1.8 2.0  --   --   --   --  1.9  --  2.3  PHOS 3.9  --   --   --   --   --  3.3  --  2.4*   < > = values in this interval not displayed.    CBC: Recent Labs  Lab 07/20/24 0326 07/22/24 0350 07/22/24 1118 07/22/24 1353 07/22/24 1552 07/23/24 0000 07/23/24 0406 07/24/24 0412  WBC 6.1 6.1  --   --   --  13.0* 12.6* 19.0*  NEUTROABS  --  3.7  --   --   --   --   --   --   HGB 10.8* 11.2*   < > 9.9* 9.9* 11.5* 11.5* 11.7*  HCT 34.8* 35.1*   < > 29.0* 29.0* 34.8* 35.8* 36.2  MCV 96.1 95.4  --   --   --  91.1 92.0 92.1  PLT 224 241  --   --   --  191 215 201   < > = values in this interval not displayed.    LFT Recent Labs  Lab 07/21/24 0609 07/22/24 0350 07/23/24 0406 07/23/24 1232 07/24/24 0412  AST 22 22 39 32 33  ALT 37 35 40 33 27  ALKPHOS 82 82 54 53 63  BILITOT 0.5 0.6 3.5* 2.8* 2.6*  PROT 4.6* 5.8* 5.0* 4.6* 5.4*  ALBUMIN  2.3* 2.4* 2.7* 2.3* 2.5*     Antibiotics: Anti-infectives (From admission, onward)    Start     Dose/Rate Route Frequency Ordered Stop   07/23/24 0000  ceFAZolin  (ANCEF ) IVPB 2g/100 mL premix        2 g 200 mL/hr over 30 Minutes Intravenous Every 8 hours 07/22/24 2314 07/23/24 0058   07/22/24 0900  ceFAZolin  (ANCEF ) IVPB 2g/100 mL premix        2 g 200 mL/hr over 30 Minutes Intravenous To ShortStay Surgical 07/21/24 1938 07/22/24 1432        DVT prophylaxis: Heparin   Code Status: Full code  Family Communication: Discussed with patient's family at bedside   CONSULTS General Surgery, gastroenterology   Subjective   Patient received IV Dilaudid  and Robaxin  this morning and became confused.  Family upset that patient is getting confused due to IV Dilaudid  and is not letting nurses give IV opioids.   Objective    Physical Examination:   Appears in no acute distress  S1-S2, regular, no murmur  auscultated Lungs clear to auscultation bilaterally Neuro-alert, oriented to self and place only, following commands   Status is: Inpatient:             [name]   [facility] If 7PM-7AM, please contact night-coverage at [email or web address], Office  [phone]   07/24/2024, 2:04 PM  LOS: 20 days

## 2024-07-24 NOTE — Progress Notes (Signed)
 PHARMACY - TOTAL PARENTERAL NUTRITION CONSULT NOTE   Indication: Gastric outlet obstruction 2nd to mass   Patient Measurements: Height: 5' 8 (172.7 cm) Weight: 115.7 kg (255 lb 1.2 oz) IBW/kg (Calculated) : 63.9 TPN AdjBW (KG): 74.7 Body mass index is 38.78 kg/m. Usual Weight:   9/9 AM update: R-iSS was dc'd overnight and changed to sensitive scale for unknown reasons s/p pancreaticoduodenectomy. Will restart Resistant scale and change CBG monitoring q8h >> q4h for tighter glucose control  Assessment: Pharmacy consulted to manage TPN for nutrition support in [name] with gastric outlet obstruction 2nd mass.  PMH: HLD, prediabetes, CKDII does not take any medication on regular basis, past surgical h/o S/P total hysterectomy and BSO    Glucose / Insulin : hx of prediabetes (A1c 6.1 in Aug 2025), no DM medication PTA, now s/p pancreaticoduodenectomy - CBGs >/= 310; 87 units rSSI + 20 u LA insulin  + 60 units insulin  R in TPN  Dexamethasone  5 mg given 9/9. She is receiving D5 1/2 NS at 50 cc/hr  Electrolytes: Na 134, K 4.9, Cl 103, Ca 8.1  [CoCa 8.94], Phos 2.4, Mag 2.3 Renal: SCr 1.38; BUN 40 Hepatic: LFTs wnl, Tbili 2.6 - albumin  2.7 - TG 71 I/O: - mIVF D5 1/2 NS @50  ml/hr to end 9/11 14:44 - UOP 0.33 ml/kg/hr - LBM: 9/8 - NG 0 mL - drains 235 mL  GI Imaging:  - 8/22 CT A/P: SBO from mass - 8/25: AXR: duodenal stricture suspicious for mass & malignancy - 8/28 AXR: Nonobstructive bowel gas pattern.  GI Surgeries / Procedures:  - 8/23 SB enteroscopy: esophagitis; duodenal mass pathology shows invasive adenocarcinoma - 9/9: whipple procedure  Central access: PICC placed  8/26 for TPN TPN start date: 07/08/2024  Nutritional Goals: RD Assessment: Estimated Needs Total Energy Estimated Needs: 1700-1900 Total Protein Estimated Needs: 75-95 grams Total Fluid Estimated Needs: >/= 1.9 L   TPN @ 75 ml/hr provides 95 g protein & 1717 kcals daily (reduced dextrose  in TPN and  increased protein to help control blood sugars)  Current Nutrition:  Tube feeds (osmolite 1.5 cal) @ 20 ml/hr started 9/11 at 00:00 TPN  **TPN was cycled per request by [facility] in hopes that pt would be discharged home before surgery, however pt now to remain in hospital until surgery**  Plan:  Today, at 1800:  Continue TPN at 75 ml/hr Electrolytes in TPN: no changes Na 65 mEq/L  K 60 mEq/L  Ca 7 mEq/L  Mg 7 mEq/L  Phos 30 mmol/L Cl:Ac 1:2 Add standard MVI and trace elements to TPN Chromium withheld d/t critical shortage Increase insulin  R to 90 units in TPN bag Continue resistant-scale SSI; CBG checks q4h + Lantus  20 u daily NaPhos 15 mmol iv x1 mIVF per TRH - D5 1/2 NS @ 50 ml/hr ending 9/11 14:44 Monitor TPN labs on Mon/Thurs, RFP AM 9/12 Monitor tolerability of J-tube feeds for discontinuation of TPN   Lauren Gray BS, PharmD, BCPS Clinical Pharmacist 07/24/2024 6:49 AM  Contact: [phone] after 3 PM

## 2024-07-24 NOTE — Anesthesia Post-op Follow-up Note (Signed)
  Anesthesia Pain Follow-up Note  Patient: CAILY RAKERS  Day #: 2  Date of Follow-up: 07/24/2024 Time: 10:47 AM  Last Vitals:  Vitals:   07/24/24 0745 07/24/24 0802  BP:    Pulse: (!) 102   Resp: (!) 31   Temp:  37.2 C  SpO2: 93%     Level of Consciousness: alert  Pain: moderate   Side Effects:None  Catheter Site Exam:clean, dry, no drainage  Anti-Coag Meds (From admission, onward)    Start     Dose/Rate Route Frequency Ordered Stop   07/23/24 0600  heparin  injection 5,000 Units       Note to Pharmacy: Discussed with anesthesia.  Ok if platelets are ok.   5,000 Units Subcutaneous Every 8 hours 07/22/24 2314        Epidural / Intrathecal (From admission, onward)    Start     Dose/Rate Route Frequency Ordered Stop   07/23/24 0000  ropivacaine  (PF) 2 mg/mL (0.2%) (NAROPIN ) injection        12 mL/hr 12 mL/hr  Epidural Continuous 07/22/24 2313          Plan: Continue current therapy of postop epidural at surgeon's request  Ari Engelbrecht L Aivy Akter

## 2024-07-24 NOTE — Progress Notes (Signed)
 2 Days Post-Op   Subjective/Chief Complaint: Pt had episode of delirium yesterday.  PCA dilaudid  hadn't been hooked up and she received dilaudid  0.5 mg very close to a dose of robaxin .  Patient had too much pain to get OOB yesterday.  Nursing reports patient reasonably comfortable while still, but having a lot of pain any time she tries to move.     Objective: Vital signs in last 24 hours: Temp:  [97.8 F (36.6 C)-99.7 F (37.6 C)] 98.9 F (37.2 C) (09/11 0802) Pulse Rate:  [62-102] 102 (09/11 0745) Resp:  [6-31] 31 (09/11 0745) BP: (136-189)/(48-156) 180/56 (09/11 0700) SpO2:  [92 %-95 %] 93 % (09/11 0745) Weight:  [115.7 kg] 115.7 kg (09/11 0500) Last BM Date : 07/18/24  Intake/Output from previous day: 09/10 0701 - 09/11 0700 In: 3062.6 [I.V.:2544.6; NG/GT:72; IV Piggyback:200.1] Out: 1160 [Urine:925; Drains:235] Intake/Output this shift: Total I/O In: -  Out: 150 [Urine:150]  General:  looks uncomfortable still Resp:  unlabored, normal rate CV:  RR&R Abd:  soft, non distended, incisional wound vac in place.  JPs serosang.  J tube in place.  NGT bloody/bilious.   Ext:  SCDs in place  Lab Results:  Recent Labs    07/23/24 0406 07/24/24 0412  WBC 12.6* 19.0*  HGB 11.5* 11.7*  HCT 35.8* 36.2  PLT 215 201   BMET Recent Labs    07/23/24 1232 07/24/24 0412  NA 135 134*  K 5.3* 4.9  CL 107 103  CO2 22 22  GLUCOSE 379* 344*  BUN 34* 40*  CREATININE 1.57* 1.38*  CALCIUM  7.8* 8.1*   PT/INR Recent Labs    07/22/24 0350 07/23/24 0406  LABPROT 13.9 16.2*  INR 1.0 1.2   ABG Recent Labs    07/22/24 1353 07/22/24 1552  PHART 7.375 7.327*  HCO3 22.6 21.3    Studies/Results: DG CHEST PORT 1 VIEW Result Date: 07/24/2024 CLINICAL DATA:  73 year old female with chest pain. Postoperative day 2 pancreaticoduodenectomy for obstructing duodenal cancer. EXAM: PORTABLE CHEST 1 VIEW COMPARISON:  Chest radiographs 04/04/2006. FINDINGS: Portable AP semi upright view  at 0607 hours. Right PICC line is in place and terminates at the carina, SVC level. Enteric tube is in place and continues into the left upper quadrant, tip not included. Partially visible abdominal skin staples and other dense left upper quadrant object of unclear (arrows). Low lung volumes. Mediastinal contours within normal limits. Visualized tracheal air column is within normal limits. No pneumothorax. Patchy perihilar opacity, confluent on the left and in the left lung base also. No pulmonary edema. Trace fluid in the right minor fissure. Small pleural effusions difficult to exclude. No definite dilated bowel loops in the visible upper abdomen. No acute osseous abnormality identified. IMPRESSION: 1. Recent postoperative changes in the abdomen. Right PICC line terminates at the SVC level. Enteric tube courses into the left abdomen, tip not included. 2. Low lung volumes with left greater than right perihilar and lung base opacity which is probably atelectasis but infection or aspiration not excluded. Electronically Signed   By: VEAR Hurst M.D.   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  Await final path.   Plan d/c ngt today and start sips of clears.  Discussed PCA dilaudid  with family.  Patient will likely need some narcotic for such a bit surgery. Agree with trying to limit as much as possible given delirium.   Scheduled robaxin  LFTs with bump in bilirubin not unexpected. Trending down.   Defer management of blood sugar to medicine.  Pt had generous panc head and uncinate process so lost a good amount of functional pancreas.    Anesthesia OK'd sq heparin  for vte ppx as long as platelets are OK.   TPN until patient tolerating J tube feeds.  Plan trickle feeds to start today.  Discussed with nursing.    Patient had GOO for a while, so would not expect her stomach to function normally for a while  plan slow diet advance.         LOS: 20 days    Jina Nephew 07/24/2024

## 2024-07-24 NOTE — Plan of Care (Addendum)
 Noncardiac chest pain-referred pain from from recent abdominal surgery-Whipple procedure  Patient is complaining 9/10 lower subxiphoid area chest pain that shooting to bilateral arms.   Patient blood pressure elevated 189/62.  Received IV hydralazine  and Dilaudid .  -EKG obtained total 3 times.Normal sinus rhythm heart rate 83. -Patient blood pressure has been improved from 190 to 153 after IV hydralazine  and after IV Dilaudid  chest pain completely resolved. -Normal troponin level. -Chest x-ray bilateral atelectasis.  Postoperative changes.  Right PICC line terminate at the SVC level. -At this time acute coronary syndrome has been ruled out.  Abbee Cremeens, MD [facility] 07/24/2024, 6:34 AM

## 2024-07-25 DIAGNOSIS — N179 Acute kidney failure, unspecified: Secondary | ICD-10-CM | POA: Diagnosis not present

## 2024-07-25 DIAGNOSIS — K5900 Constipation, unspecified: Secondary | ICD-10-CM | POA: Diagnosis not present

## 2024-07-25 DIAGNOSIS — K56609 Unspecified intestinal obstruction, unspecified as to partial versus complete obstruction: Secondary | ICD-10-CM | POA: Diagnosis not present

## 2024-07-25 DIAGNOSIS — C170 Malignant neoplasm of duodenum: Secondary | ICD-10-CM | POA: Diagnosis not present

## 2024-07-25 LAB — TYPE AND SCREEN
ABO/RH(D): B POS
Antibody Screen: NEGATIVE
Unit division: 0
Unit division: 0
Unit division: 0
Unit division: 0

## 2024-07-25 LAB — BPAM RBC
Blood Product Expiration Date: 202510052359
Blood Product Expiration Date: 202510032359
Blood Product Expiration Date: 202510032359
Blood Product Unit Number: 202510052359
ISSUE DATE / TIME: 202509090946
PRODUCT CODE: 202509090946
PRODUCT CODE: 202510052359
Unit Type and Rh: 7300
Unit Type and Rh: 202510052359
Unit Type and Rh: 7300
Unit Type and Rh: 7300
Unit Type and Rh: 7300
Unit Type and Rh: 7300

## 2024-07-25 LAB — GLUCOSE, CAPILLARY
Glucose-Capillary: 172 mg/dL — ABNORMAL HIGH (ref 70–99)
Glucose-Capillary: 175 mg/dL — ABNORMAL HIGH (ref 70–99)
Glucose-Capillary: 182 mg/dL — ABNORMAL HIGH (ref 70–99)
Glucose-Capillary: 206 mg/dL — ABNORMAL HIGH (ref 70–99)
Glucose-Capillary: 220 mg/dL — ABNORMAL HIGH (ref 70–99)
Glucose-Capillary: 228 mg/dL — ABNORMAL HIGH (ref 70–99)

## 2024-07-25 LAB — COMPREHENSIVE METABOLIC PANEL WITH GFR
ALT: 22 U/L (ref 0–44)
AST: 26 U/L (ref 15–41)
Albumin: 2 g/dL — ABNORMAL LOW (ref 3.5–5.0)
Alkaline Phosphatase: 64 U/L (ref 38–126)
Anion gap: 10 (ref 5–15)
BUN: 32 mg/dL — ABNORMAL HIGH (ref 8–23)
CO2: 21 mmol/L — ABNORMAL LOW (ref 22–32)
Calcium: 7.8 mg/dL — ABNORMAL LOW (ref 8.9–10.3)
Chloride: 105 mmol/L (ref 98–111)
Creatinine, Ser: 1.06 mg/dL — ABNORMAL HIGH (ref 0.44–1.00)
GFR, Estimated: 55 mL/min — ABNORMAL LOW (ref >60)
Glucose, Bld: 198 mg/dL — ABNORMAL HIGH (ref 70–99)
Potassium: 4.2 mmol/L (ref 3.5–5.1)
Sodium: 136 mmol/L (ref 135–145)
Total Bilirubin: 1.9 mg/dL — ABNORMAL HIGH (ref 0.0–1.2)
Total Protein: 5.2 g/dL — ABNORMAL LOW (ref 6.5–8.1)

## 2024-07-25 LAB — RENAL FUNCTION PANEL
Albumin: 2.1 g/dL — ABNORMAL LOW (ref 3.5–5.0)
Anion gap: 9 (ref 5–15)
BUN: 31 mg/dL — ABNORMAL HIGH (ref 8–23)
CO2: 21 mmol/L — ABNORMAL LOW (ref 22–32)
Calcium: 7.8 mg/dL — ABNORMAL LOW (ref 8.9–10.3)
Chloride: 107 mmol/L (ref 98–111)
Creatinine, Ser: 1.11 mg/dL — ABNORMAL HIGH (ref 0.44–1.00)
GFR, Estimated: 52 mL/min — ABNORMAL LOW (ref >60)
Glucose, Bld: 195 mg/dL — ABNORMAL HIGH (ref 70–99)
Phosphorus: 2.7 mg/dL (ref 2.5–4.6)
Potassium: 4.2 mmol/L (ref 3.5–5.1)
Sodium: 137 mmol/L (ref 135–145)

## 2024-07-25 MED ORDER — INSULIN GLARGINE 100 UNIT/ML ~~LOC~~ SOLN
20.0000 [IU] | Freq: Two times a day (BID) | SUBCUTANEOUS | Status: DC
  Administered 2024-07-25 – 2024-07-26 (×3): 20 [IU] via SUBCUTANEOUS
  Filled 2024-07-25 (×5): qty 0.2

## 2024-07-25 MED ORDER — TRAVASOL 10 % IV SOLN
INTRAVENOUS | Status: AC
  Filled 2024-07-25: qty 954

## 2024-07-25 MED ORDER — ACETAMINOPHEN 500 MG PO TABS
1000.0000 mg | ORAL_TABLET | Freq: Four times a day (QID) | ORAL | Status: DC
  Administered 2024-07-25 – 2024-07-26 (×5): 1000 mg via ORAL
  Filled 2024-07-25 (×6): qty 2

## 2024-07-25 NOTE — Progress Notes (Signed)
 3 Days Post-Op   Subjective/Chief Complaint: J tube feeds started yesterday.  Had small amount of flatus.  Also had some nausea, but improved today.  Pain improved today.    Objective: Vital signs in last 24 hours: Temp:  [98.5 F (36.9 C)-99.4 F (37.4 C)] 98.8 F (37.1 C) (09/12 0754) Pulse Rate:  [91-110] 103 (09/12 0700) Resp:  [15-42] 28 (09/12 0700) BP: (110-186)/(47-123) 154/52 (09/12 0700) SpO2:  [92 %-96 %] 94 % (09/12 0700) Weight:  [882 kg] 117 kg (09/12 0416) Last BM Date : 07/18/24  Intake/Output from previous day: 09/11 0701 - 09/12 0700 In: 4032.3 [P.O.:6; I.V.:2759.6; NG/GT:541.7; IV Piggyback:400] Out: 1740 [Urine:1475; Drains:265] Intake/Output this shift: No intake/output data recorded.  General:  looks uncomfortable still, but just got OOB to chair.   Resp:  unlabored, normal rate CV:  RR&R Abd:  soft, non distended, incisional wound vac in place.  JPs serosang.  J tube in place.   Ext:  SCDs in place  Lab Results:  Recent Labs    07/23/24 0406 07/24/24 0412  WBC 12.6* 19.0*  HGB 11.5* 11.7*  HCT 35.8* 36.2  PLT 215 201   BMET Recent Labs    07/24/24 0412 07/25/24 0400  NA 134* 136  137  K 4.9 4.2  4.2  CL 103 105  107  CO2 22 21*  21*  GLUCOSE 344* 198*  195*  BUN 40* 32*  31*  CREATININE 1.38* 1.06*  1.11*  CALCIUM  8.1* 7.8*  7.8*   PT/INR Recent Labs    07/23/24 0406  LABPROT 16.2*  INR 1.2   ABG Recent Labs    07/22/24 1353 07/22/24 1552  PHART 7.375 7.327*  HCO3 22.6 21.3    Studies/Results: DG CHEST PORT 1 VIEW Result Date: 07/24/2024 CLINICAL DATA:  73 year old female with chest pain. Postoperative day 2 pancreaticoduodenectomy for obstructing duodenal cancer. EXAM: PORTABLE CHEST 1 VIEW COMPARISON:  Chest radiographs 04/04/2006. FINDINGS: Portable AP semi upright view at 0607 hours. Right PICC line is in place and terminates at the carina, SVC level. Enteric tube is in place and continues into the left upper  quadrant, tip not included. Partially visible abdominal skin staples and other dense left upper quadrant object of unclear (arrows). Low lung volumes. Mediastinal contours within normal limits. Visualized tracheal air column is within normal limits. No pneumothorax. Patchy perihilar opacity, confluent on the left and in the left lung base also. No pulmonary edema. Trace fluid in the right minor fissure. Small pleural effusions difficult to exclude. No definite dilated bowel loops in the visible upper abdomen. No acute osseous abnormality identified. IMPRESSION: 1. Recent postoperative changes in the abdomen. Right PICC line terminates at the SVC level. Enteric tube courses into the left abdomen, tip not included. 2. Low lung volumes with left greater than right perihilar and lung base opacity which is probably atelectasis but infection or aspiration not excluded. Electronically Signed   By: VEAR Hurst M.D.   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  pT3bN1 final path.    Agree with trying to limit narcotics as much as possible given delirium, but patient will likely still need some given magnitude of surgery.  Scheduled robaxin , prn tramadol , schedule oral tylenol  today.  LFTs with bump in bilirubin not unexpected. T bili continues to trend down.   AKI improving.   Defer management of blood sugar to medicine.  Pt had  generous panc head and uncinate process so lost a good amount of functional pancreas.    Anesthesia OK'd sq heparin  for vte ppx as long as platelets are OK.  Continue epidural for now.  Plan d/c POD 5 give or take a day.    TPN until patient tolerating J tube feeds.  Continue trickle feeds today and start ramping up tomorrow.    Patient had GOO for a while, so would not expect her stomach to function normally for a while  plan slow diet advance.         LOS: 21 days    Jina Nephew 07/25/2024

## 2024-07-25 NOTE — Evaluation (Signed)
 Physical Therapy Evaluation Patient Details Name: Lauren Gray MRN: [id number] DOB: 07-17-1951 Today's Date: 07/25/2024  History of Present Illness  Pt is a 73 y/o female presenting with 1 month of nausea/vomiting. CT abdomen showed SBO from underlying mass. Mass biopsy showed + invasive adenocarcinoma. Requiring TPN. S/p pancreaticoduodenectomy, pancreatic duct stent placement, J-tube placement 9/9. PMH: hypokalemia, HTN, CKD, prediabetes  Clinical Impression  Pt presents with decreased activity tolerance and functional mobility. Pt to benefit from acute PT to address deficits. PT educated on abdominal precautions and attempted log rolling, but pt limited by pain. Pt required +2 modA to sit on L EOB. Pt demonstrates good seated balance at EOB. Pt ambulated from bed to recliner with +2 handheld assist. Pt is expected to progress well with acute PT and follow up with HHPT. PT to progress mobility as tolerated, and will continue to follow acutely.       If plan is discharge home, recommend the following: A lot of help with walking and/or transfers;A little help with bathing/dressing/bathroom;Assist for transportation;Help with stairs or ramp for entrance   Can travel by private vehicle        Equipment Recommendations Other (comment) (TBD)  Recommendations for Other Services       Functional Status Assessment Patient has had a recent decline in their functional status and demonstrates the ability to make significant improvements in function in a reasonable and predictable amount of time.     Precautions / Restrictions Precautions Precautions: Fall;Other (comment) Precaution/Restrictions Comments: JP drainx2, small wound vac, epidural Restrictions Weight Bearing Restrictions Per Provider Order: No      Mobility  Bed Mobility Overal bed mobility: Needs Assistance Bed Mobility: Supine to Sit     Supine to sit: Mod assist, +2 for physical assistance, HOB elevated     General bed  mobility comments: Cues for sequencing. Assist at trunk and LE towards L EOB    Transfers Overall transfer level: Needs assistance Equipment used: 2 person hand held assist Transfers: Sit to/from Stand Sit to Stand: Min assist, +2 physical assistance, From elevated surface           General transfer comment: Assist for power up and steadying once standing    Ambulation/Gait Ambulation/Gait assistance: Min assist, +2 safety/equipment, +2 physical assistance Gait Distance (Feet): 5 Feet Assistive device: 2 person hand held assist Gait Pattern/deviations: Step-through pattern, Decreased stride length, Shuffle Gait velocity: Decreased     General Gait Details: Limited by pain. Unsteady and min A to correct balance  Stairs            Wheelchair Mobility     Tilt Bed    Modified Rankin (Stroke Patients Only)       Balance Overall balance assessment: Needs assistance Sitting-balance support: Bilateral upper extremity supported, Feet supported Sitting balance-Leahy Scale: Fair Sitting balance - Comments: Limited by pain   Standing balance support: During functional activity, Bilateral upper extremity supported Standing balance-Leahy Scale: Poor Standing balance comment: Handheld assist during ambulation and to stand                             Pertinent Vitals/Pain Pain Assessment Pain Assessment: Faces Faces Pain Scale: Hurts little more Pain Location: abdomen with activity, L shoulder Pain Descriptors / Indicators: Guarding, Grimacing, Sore, Aching Pain Intervention(s): Limited activity within patient's tolerance, Monitored during session, Patient requesting pain meds-RN notified    Home Living Family/patient expects to be discharged to::  Private residence Living Arrangements: Spouse/significant other Available Help at Discharge: Family Type of Home: House Home Access: Level entry     Alternate Level Stairs-Number of Steps: 5 + 6 to get to  main level   Home Equipment: Agricultural consultant (2 wheels);Cane - single point;Hand held shower head;Grab bars - tub/shower      Prior Function Prior Level of Function : Independent/Modified Independent;Working/employed;Driving             Mobility Comments: no AD ADLs Comments: Indep with ADLs, IADLs, works in teaching     Extremity/Trunk Assessment   Upper Extremity Assessment Upper Extremity Assessment: Overall WFL for tasks assessed    Lower Extremity Assessment Lower Extremity Assessment: Generalized weakness (At least 3/5 BLE)    Cervical / Trunk Assessment Cervical / Trunk Assessment: Normal  Communication   Communication Communication: No apparent difficulties    Cognition Arousal: Alert Behavior During Therapy: WFL for tasks assessed/performed   PT - Cognitive impairments: No apparent impairments                         Following commands: Intact       Cueing Cueing Techniques: Verbal cues     General Comments General comments (skin integrity, edema, etc.): SBP 170 at end of session. RN notified. Other VSS    Exercises     Assessment/Plan    PT Assessment Patient needs continued PT services  PT Problem List Decreased activity tolerance;Decreased strength;Decreased balance;Decreased mobility;Pain       PT Treatment Interventions Gait training;Stair training;Functional mobility training;Therapeutic activities;Therapeutic exercise;DME instruction    PT Goals (Current goals can be found in the Care Plan section)  Acute Rehab PT Goals Patient Stated Goal: Return to independence PT Goal Formulation: With patient Time For Goal Achievement: 08/08/24 Potential to Achieve Goals: Good    Frequency Min 2X/week     Co-evaluation               AM-PAC PT 6 Clicks Mobility  Outcome Measure Help needed turning from your back to your side while in a flat bed without using bedrails?: A Lot Help needed moving from lying on your back to  sitting on the side of a flat bed without using bedrails?: A Lot Help needed moving to and from a bed to a chair (including a wheelchair)?: A Lot Help needed standing up from a chair using your arms (e.g., wheelchair or bedside chair)?: A Lot Help needed to walk in hospital room?: A Lot Help needed climbing 3-5 steps with a railing? : Total 6 Click Score: 11    End of Session   Activity Tolerance: Patient tolerated treatment well Patient left: in chair;with call bell/phone within reach;with chair alarm set;with family/visitor present;with nursing/sitter in room Nurse Communication: Mobility status PT Visit Diagnosis: Other abnormalities of gait and mobility (R26.89);Pain;Muscle weakness (generalized) (M62.81) Pain - part of body:  (Abdomen)    Time: 9092-9063 PT Time Calculation (min) (ACUTE ONLY): 29 min   Charges:   PT Evaluation $PT Eval Low Complexity: 1 Low PT Treatments $Therapeutic Activity: 8-22 mins PT General Charges $$ ACUTE PT VISIT: 1 Visit         Quintin Campi, SPT  Acute Rehab  [phone]   Quintin Campi 07/25/2024, 11:08 AM

## 2024-07-25 NOTE — Progress Notes (Signed)
 [facility]  PROGRESS NOTE  Lauren Gray FMW:[id number] DOB: 10-27-1951 DOA: 07/04/2024 PCP: Juanice Thomes SAUNDERS, FNP   Brief HPI:   73 yo female presented 18 days ago on [facility] service 2/2 gastric outlet obstruction with findings of underlying duodenal cancer. On original presentation, pt complained of lack of PO intake + N/V that has been constant. Decreased BM but + flatus and also denied abdominal pain. Pt did endorse weightloss of ~10 pounds since June. Denies known sick contacts, no f/c. No sob/cp /dizziness. Denies etoh use or tobacco use. Pt underwent whipple procedure today.  The patient underwent a Whipple procedure on 07/22/2024. The patient was seen in the PACU following the procedure. She has tolerated the procedure well    Assessment/Plan:   Duodenal adenocarcinoma with gastric outlet obstruction CT scan showed dilated 1st and 2nd portion of duodenum with possible underlying mass. Patient was seen by gastroenterology and underwent small bowel enteroscopy.  The duodenal mass was biopsied.  Pathology was positive for adenocarcinoma. General surgery was consulted .  Patient has poor nutritional status.  Due to gastric outlet obstruction from the duodenal mass patient   started on TPN.   Due to persistent nausea and vomiting NG tube was inserted.   Chloraseptic spray and viscous lidocaine  can be used for mouth/throat discomfort.   Surgery (Whipple procedure) was performed on 07/22/2024.  - Continue Dilaudid  PCA -Patient is on postoperative epidural with ropivacaine , anesthesiology following - Discussed with family determined that patient will need IV Dilaudid  or morphine  as pain will not be able to controlled with epidural anesthesia.    - Continue Dilaudid  PCA   Hypokalemia/hypophosphatemia Resolved. Nutrition to be provided by TPN as per surgery.   Essential hypertension The patient is currently normotensive.   Situational anxiety Ativan  as needed.    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 Creatinine is now elevated at 1.38; likely in setting of dehydration - Improved after starting normal saline at 40 mL/h   Mild elevated lipase Resolved   Medications     sodium chloride    Intravenous Once   Chlorhexidine  Gluconate Cloth  6 each Topical Q0600   fluticasone   2 spray Each Nare Daily   gabapentin   100 mg Oral TID   heparin  injection (subcutaneous)  5,000 Units Subcutaneous Q8H   HYDROmorphone    Intravenous Q4H   insulin  aspart  0-20 Units Subcutaneous Q4H   insulin  glargine  20 Units Subcutaneous BID   methocarbamol  (ROBAXIN ) injection  500 mg Intravenous Q8H   metoprolol  tartrate  5 mg Intravenous Q6H   pantoprazole  (PROTONIX ) IV  40 mg Intravenous Q12H     Data Reviewed:   CBG:  Recent Labs  Lab 07/24/24 1459 07/24/24 1957 07/24/24 2258 07/25/24 0358 07/25/24 0745  GLUCAP 334* 253* 232* 228* 172*    SpO2: 94 % O2 Flow Rate (L/min): 3 L/min FiO2 (%): 30 %    Vitals:   07/25/24 0612 07/25/24 0620 07/25/24 0630 07/25/24 0700  BP:   (!) 146/47 (!) 154/52  Pulse: 93 93 96 (!) 103  Resp: (!) 26 (!) 21 (!) 22 (!) 28  Temp:      TempSrc:      SpO2: 94% 94% 94% 94%  Weight:      Height:          Data Reviewed:  Basic Metabolic Panel: Recent Labs  Lab 07/21/24 0609 07/22/24 0350 07/22/24 1118 07/22/24 1552 07/23/24 0000 07/23/24 0406 07/23/24 1232 07/24/24 0412 07/25/24 0400  NA 139 140   < > 141  --  136 135 134* 136  137  K 3.8 3.7   < > 4.6  --  5.1 5.3* 4.9 4.2  4.2  CL 110 110  --   --   --  107 107 103 105  107  CO2 24 24  --   --   --  20* 22 22 21*  21*  GLUCOSE 146* 115*  --   --   --  361* 379* 344* 198*  195*  BUN 23 21  --   --   --  29* 34* 40* 32*  31*  CREATININE 1.07* 1.06*  --   --  1.14* 1.18* 1.57*  1.38* 1.06*  1.11*  CALCIUM  8.5* 8.5*  --   --   --  7.9* 7.8* 8.1* 7.8*  7.8*  MG 1.8 2.0  --   --   --  1.9  --  2.3  --   PHOS 3.9  --   --   --   --  3.3  --  2.4* 2.7   < > = values in this interval not displayed.    CBC: Recent Labs  Lab 07/20/24 0326 07/22/24 0350 07/22/24 1118 07/22/24 1353 07/22/24 1552 07/23/24 0000 07/23/24 0406 07/24/24 0412  WBC 6.1 6.1  --   --   --  13.0* 12.6* 19.0*  NEUTROABS  --  3.7  --   --   --   --   --   --   HGB 10.8* 11.2*   < > 9.9* 9.9* 11.5* 11.5* 11.7*  HCT 34.8* 35.1*   < > 29.0* 29.0* 34.8* 35.8* 36.2  MCV 96.1 95.4  --   --   --  91.1 92.0 92.1  PLT 224 241  --   --   --  191 215 201   < > = values in this interval not displayed.    LFT Recent Labs  Lab 07/22/24 0350 07/23/24 0406 07/23/24 1232 07/24/24 0412 07/25/24 0400  AST 22 39 32 33 26  ALT 35 40 33 27 22  ALKPHOS 82 54 53 63 64  BILITOT 0.6 3.5* 2.8* 2.6* 1.9*  PROT 5.8* 5.0* 4.6* 5.4* 5.2*  ALBUMIN  2.4* 2.7* 2.3* 2.5* 2.0*  2.1*     Antibiotics: Anti-infectives (From admission, onward)    Start     Dose/Rate Route Frequency Ordered Stop   07/23/24 0000  ceFAZolin  (ANCEF ) IVPB 2g/100 mL premix        2 g 200 mL/hr over 30 Minutes Intravenous Every 8 hours 07/22/24 2314 07/23/24 0058   07/22/24 0900  ceFAZolin  (ANCEF ) IVPB 2g/100 mL premix        2 g 200 mL/hr over 30 Minutes Intravenous To ShortStay Surgical 07/21/24 1938 07/22/24 1432        DVT prophylaxis: Heparin   Code Status: Full code  Family Communication: Discussed with patient's family at bedside   CONSULTS General Surgery, gastroenterology   Subjective    Pain has improved  Objective    Physical Examination:   Appears in no acute distress S1-S2, regular Lungs clear to auscultation bilaterally Abdomen soft, nontender   Status is: Inpatient:             [name]   [facility] If 7PM-7AM, please  contact night-coverage at [email or web address], Office   [phone]   07/25/2024, 7:49 AM  LOS: 21 days

## 2024-07-25 NOTE — Anesthesia Post-op Follow-up Note (Signed)
  Anesthesia Pain Follow-up Note  Patient: Lauren Gray  Day #: 3  Gray of Follow-up: 07/25/2024 Time: 1:36 PM  Last Vitals:  Vitals:   07/25/24 1200 07/25/24 1300  BP: 139/60 (!) 143/58  Pulse: 94 96  Resp: 19 (!) 22  Temp:    SpO2: 95% 94%    Level of Consciousness: alert  Pain: mild   Side Effects:None  Catheter Site Exam:clean, dry, no drainage  Anti-Coag Meds (From admission, onward)    Start     Dose/Rate Route Frequency Ordered Stop   07/23/24 0600  heparin  injection 5,000 Units       Note to Pharmacy: Discussed with anesthesia.  Ok if platelets are ok.   5,000 Units Subcutaneous Every 8 hours 07/22/24 2314        Epidural / Intrathecal (From admission, onward)    Start     Dose/Rate Route Frequency Ordered Stop   07/23/24 0000  ropivacaine  (PF) 2 mg/mL (0.2%) (NAROPIN ) injection        12 mL/hr 12 mL/hr  Epidural Continuous 07/22/24 2313          Plan: Continue current therapy of postop epidural at surgeon's request  Joann Jorge L Shavaun Osterloh

## 2024-07-25 NOTE — Progress Notes (Incomplete)
 PHARMACY - TOTAL PARENTERAL NUTRITION CONSULT NOTE  Indication: Gastric outlet obstruction 2nd to mass   Patient Measurements: Height: 5' 8 (172.7 cm) Weight: 117 kg (257 lb 15 oz) IBW/kg (Calculated) : 63.9 TPN AdjBW (KG): 74.7 Body mass index is 39.22 kg/m.   Assessment:  Pharmacy consulted to manage TPN for nutrition support in [name] with gastric outlet obstruction 2nd mass.  PMH: HLD, prediabetes, CKDII does not take any medication on regular basis, past surgical h/o S/P total hysterectomy and BSO   Glucose / Insulin : hx preDM (A1c 6.1 in Aug 2025) CBGs uncontrolled Decadron  5mg  given 9/9 Used 66 units rSSI + 20 u LA insulin  + 90 units insulin  R in TPN  Electrolytes: Na 136, K 4.2, Cl 105, Ca 7.8  [CoCa 9.4], Phos 2.7, Mag 2.3 Renal: SCr 1.06; BUN 32 Hepatic: LFTs / TG WNL, tbili 1.9. albumin  2.7 I/O: UOP 0.5 ml/kg/hr, drain , LBM 9/8 GI Imaging:  8/22 CT A/P: SBO from mass 8/25: AXR: duodenal stricture suspicious for mass & malignancy 8/28 AXR: Nonobstructive bowel gas pattern.  GI Surgeries / Procedures:  8/23 SB enteroscopy: esophagitis; invasive adenocarcinoma 9/9: Whipple procedure  Central access: PICC placed 07/08/24 TPN start date: 07/08/2024  Nutritional Goals:  RD Estimated Needs Total Energy Estimated Needs: 1700-1900 Total Protein Estimated Needs: 75-95 grams Total Fluid Estimated Needs: >/= 1.9 L   TPN @ 75 ml/hr provides 95 g protein & 1717 kcals daily (reduced dextrose  in TPN and increased protein to help control blood sugars)  Current Nutrition:  TPN CLD started 9/12 Osmolite 1.5 at 20 ml/hr   Plan:  Continue TPN at 75 ml/hr to meet 100% of needs Electrolytes in TPN: Na 65 mEq/L, K 63 mEq/L, Ca 7 mEq/L, Mg 7 mEq/L, Phos 30 mmol/L, max acetate Add standard MVI and trace elements to TPN Continue resistant SSI Q4H, 90 units regular insulin  in TPN, Latnus 20 units SQ BID  Monitor TPN labs on Mon/Thurs - labs in AM Monitor tolerability of  J-tube feeds for discontinuation of TPN

## 2024-07-25 NOTE — Progress Notes (Signed)
 PHARMACY - TOTAL PARENTERAL NUTRITION CONSULT NOTE   Indication: Gastric outlet obstruction 2nd to mass   Patient Measurements: Height: 5' 8 (172.7 cm) Weight: 117 kg (257 lb 15 oz) IBW/kg (Calculated) : 63.9 TPN AdjBW (KG): 74.7 Body mass index is 39.22 kg/m. Usual Weight:   9/9 AM update: R-iSS was dc'd overnight and changed to sensitive scale for unknown reasons s/p pancreaticoduodenectomy. Will restart Resistant scale and change CBG monitoring q8h >> q4h for tighter glucose control  Assessment: Pharmacy consulted to manage TPN for nutrition support in [name] with gastric outlet obstruction 2nd mass.  PMH: HLD, prediabetes, CKDII does not take any medication on regular basis, past surgical h/o S/P total hysterectomy and BSO    Glucose / Insulin : hx of prediabetes (A1c 6.1 in Aug 2025), no DM medication PTA, now s/p pancreaticoduodenectomy - CBGs 228-334 66 units rSSI + 20 u LA insulin  + 90 units insulin  R in TPN  Dexamethasone  5 mg given 9/9. She is receiving D5 1/2 NS at 50 cc/hr  Electrolytes: Na 136, K 4.2, Cl 105, Ca 7.8  [CoCa 9.4], Phos 2.7, Mag 2.3 Renal: SCr 1.06; BUN 32 Hepatic: LFTs wnl, Tbili 1.9 - albumin  2.7 - TG 71 I/O: - mIVF NS @ 40 ml/hr [9/11 1030 >> 9/12 1029] - UOP 0.53 ml/kg/hr - LBM: 9/8 - NG not charted - drains 265 mL  GI Imaging:  - 8/22 CT A/P: SBO from mass - 8/25: AXR: duodenal stricture suspicious for mass & malignancy - 8/28 AXR: Nonobstructive bowel gas pattern.  GI Surgeries / Procedures:  - 8/23 SB enteroscopy: esophagitis; duodenal mass pathology shows invasive adenocarcinoma - 9/9: whipple procedure  Central access: PICC placed  8/26 for TPN TPN start date: 07/08/2024  Nutritional Goals: RD Assessment: Estimated Needs Total Energy Estimated Needs: 1700-1900 Total Protein Estimated Needs: 75-95 grams Total Fluid Estimated Needs: >/= 1.9 L   TPN @ 75 ml/hr provides 95 g protein & 1717 kcals daily (reduced dextrose  in TPN and  increased protein to help control blood sugars)  Current Nutrition:  Tube feeds (osmolite 1.5 cal) @ 20 ml/hr started 9/11 at 00:00 TPN  **TPN was cycled per request by [facility] in hopes that pt would be discharged home before surgery, however pt now to remain in hospital until surgery**  Plan:  Today, at 1800:  Continue TPN at 75 ml/hr Electrolytes in TPN: no changes Na 65 mEq/L  K 63 mEq/L  Ca 7 mEq/L  Mg 7 mEq/L  Phos 30 mmol/L Cl:Ac maximize acetate Add standard MVI and trace elements to TPN Chromium withheld d/t critical shortage continue insulin  R to 90 units in TPN bag (adjusted Lantus ) Continue resistant-scale SSI; CBG checks q4h + increase Lantus  to 40 u daily mIVF per [facility] - NS @ 0 ml/hr ending 9/12 10:29 Monitor TPN labs on Mon/Thurs, RFP + mag AM 9/13 Monitor tolerability of J-tube feeds for discontinuation of TPN   Pattiann Solanki BS, PharmD, BCPS Clinical Pharmacist 07/25/2024 6:40 AM  Contact: [phone] after 3 PM

## 2024-07-26 DIAGNOSIS — K5900 Constipation, unspecified: Secondary | ICD-10-CM | POA: Diagnosis not present

## 2024-07-26 DIAGNOSIS — N179 Acute kidney failure, unspecified: Secondary | ICD-10-CM | POA: Diagnosis not present

## 2024-07-26 DIAGNOSIS — C170 Malignant neoplasm of duodenum: Secondary | ICD-10-CM | POA: Diagnosis not present

## 2024-07-26 DIAGNOSIS — K56609 Unspecified intestinal obstruction, unspecified as to partial versus complete obstruction: Secondary | ICD-10-CM | POA: Diagnosis not present

## 2024-07-26 LAB — GLUCOSE, CAPILLARY
Glucose-Capillary: 173 mg/dL — ABNORMAL HIGH (ref 70–99)
Glucose-Capillary: 185 mg/dL — ABNORMAL HIGH (ref 70–99)
Glucose-Capillary: 187 mg/dL — ABNORMAL HIGH (ref 70–99)
Glucose-Capillary: 176 mg/dL — ABNORMAL HIGH (ref 70–99)
Glucose-Capillary: 188 mg/dL — ABNORMAL HIGH (ref 70–99)
Glucose-Capillary: 155 mg/dL — ABNORMAL HIGH (ref 70–99)

## 2024-07-26 LAB — COMPREHENSIVE METABOLIC PANEL WITH GFR
ALT: 18 U/L (ref 0–44)
AST: 21 U/L (ref 15–41)
Albumin: 1.9 g/dL — ABNORMAL LOW (ref 3.5–5.0)
Alkaline Phosphatase: 70 U/L (ref 38–126)
Anion gap: 10 (ref 5–15)
BUN: 30 mg/dL — ABNORMAL HIGH (ref 8–23)
CO2: 21 mmol/L — ABNORMAL LOW (ref 22–32)
Calcium: 7.9 mg/dL — ABNORMAL LOW (ref 8.9–10.3)
Chloride: 105 mmol/L (ref 98–111)
Creatinine, Ser: 0.95 mg/dL (ref 0.44–1.00)
GFR, Estimated: >60 mL/min (ref >60)
Glucose, Bld: 196 mg/dL — ABNORMAL HIGH (ref 70–99)
Potassium: 4.4 mmol/L (ref 3.5–5.1)
Sodium: 136 mmol/L (ref 135–145)
Total Bilirubin: 2 mg/dL — ABNORMAL HIGH (ref 0.0–1.2)
Total Protein: 5.7 g/dL — ABNORMAL LOW (ref 6.5–8.1)

## 2024-07-26 LAB — MAGNESIUM: Magnesium: 2.2 mg/dL (ref 1.7–2.4)

## 2024-07-26 LAB — RENAL FUNCTION PANEL
Albumin: 1.9 g/dL — ABNORMAL LOW (ref 3.5–5.0)
Anion gap: 12 (ref 5–15)
BUN: 30 mg/dL — ABNORMAL HIGH (ref 8–23)
CO2: 20 mmol/L — ABNORMAL LOW (ref 22–32)
Calcium: 7.9 mg/dL — ABNORMAL LOW (ref 8.9–10.3)
Chloride: 104 mmol/L (ref 98–111)
Creatinine, Ser: 1.1 mg/dL — ABNORMAL HIGH (ref 0.44–1.00)
GFR, Estimated: 53 mL/min — ABNORMAL LOW (ref >60)
Glucose, Bld: 193 mg/dL — ABNORMAL HIGH (ref 70–99)
Phosphorus: 3.2 mg/dL (ref 2.5–4.6)
Potassium: 4.5 mmol/L (ref 3.5–5.1)
Sodium: 136 mmol/L (ref 135–145)

## 2024-07-26 MED ORDER — INSULIN GLARGINE 100 UNIT/ML ~~LOC~~ SOLN
25.0000 [IU] | Freq: Two times a day (BID) | SUBCUTANEOUS | Status: DC
  Administered 2024-07-26 – 2024-07-27 (×2): 25 [IU] via SUBCUTANEOUS
  Filled 2024-07-26 (×3): qty 0.25

## 2024-07-26 MED ORDER — OSMOLITE 1.5 CAL PO LIQD
1000.0000 mL | ORAL | Status: DC
  Administered 2024-07-26 – 2024-07-27 (×2): 1000 mL

## 2024-07-26 MED ORDER — AMLODIPINE BESYLATE 5 MG PO TABS
5.0000 mg | ORAL_TABLET | Freq: Every day | ORAL | Status: DC
  Administered 2024-07-26 – 2024-07-27 (×2): 5 mg via JEJUNOSTOMY
  Filled 2024-07-26 (×2): qty 1

## 2024-07-26 MED ORDER — AMLODIPINE BESYLATE 5 MG PO TABS: 5.0000 mg | ORAL_TABLET | Freq: Every day | ORAL | Status: DC

## 2024-07-26 MED ORDER — ACETAMINOPHEN 500 MG PO TABS
1000.0000 mg | ORAL_TABLET | Freq: Four times a day (QID) | ORAL | Status: DC
  Administered 2024-07-26 – 2024-07-27 (×2): 1000 mg via JEJUNOSTOMY
  Filled 2024-07-26: qty 2

## 2024-07-26 MED ORDER — TRAVASOL 10 % IV SOLN
INTRAVENOUS | Status: AC
  Filled 2024-07-26: qty 954

## 2024-07-26 MED ORDER — ACETAMINOPHEN 500 MG PO TABS: 1000.0000 mg | ORAL_TABLET | Freq: Four times a day (QID) | ORAL | Status: DC

## 2024-07-26 MED ORDER — INSULIN GLARGINE 100 UNIT/ML ~~LOC~~ SOLN
5.0000 [IU] | Freq: Once | SUBCUTANEOUS | Status: AC
Start: 2024-07-26 — End: 2024-07-26
  Administered 2024-07-26: 5 [IU] via SUBCUTANEOUS
  Filled 2024-07-26: qty 0.05

## 2024-07-26 NOTE — Anesthesia Post-op Follow-up Note (Signed)
  Anesthesia Pain Follow-up Note  Patient: Lauren Gray  Day #: 4  Date of Follow-up: 07/26/2024 Time: 4:46 PM  Last Vitals:  Vitals:   07/26/24 1500 07/26/24 1600  BP: (!) 161/74 136/62  Pulse: 99 (!) 101  Resp: (!) 30 17  Temp:    SpO2: 94% 95%    Level of Consciousness: alert  Pain: mild   Side Effects:None  Catheter Site Exam:clean, dry, no drainage. Dressing in place  Anti-Coag Meds (From admission, onward)    Start     Dose/Rate Route Frequency Ordered Stop   07/23/24 0600  heparin  injection 5,000 Units       Note to Pharmacy: Discussed with anesthesia.  Ok if platelets are ok.   5,000 Units Subcutaneous Every 8 hours 07/22/24 2314        Epidural / Intrathecal (From admission, onward)    Start     Dose/Rate Route Frequency Ordered Stop   07/23/24 0000  ropivacaine  (PF) 2 mg/mL (0.2%) (NAROPIN ) injection        12 mL/hr 12 mL/hr  Epidural Continuous 07/22/24 2313          Plan: Continue current therapy of postop epidural at surgeon's request Per surgeon's note, plan to D/C on POD5  [name]

## 2024-07-26 NOTE — Progress Notes (Signed)
 4 Days Post-Op   Subjective/Chief Complaint: Significant flatus.  Significant leakage around J tube requiring re-enforcement of the dressing overnight.   Objective: Vital signs in last 24 hours: Temp:  [97.6 F (36.4 C)-98.9 F (37.2 C)] 98.7 F (37.1 C) (09/13 0400) Pulse Rate:  [92-117] 93 (09/13 0700) Resp:  [17-48] 24 (09/13 0700) BP: (139-202)/(46-70) 139/49 (09/13 0600) SpO2:  [91 %-96 %] 92 % (09/13 0700) Weight:  [117.7 kg] 117.7 kg (09/13 0418) Last BM Date : 07/18/24  Intake/Output from previous day: 09/12 0701 - 09/13 0700 In: 2685.8 [I.V.:1917.8; NG/GT:480] Out: 1225 [Urine:1075; Drains:150] Intake/Output this shift: No intake/output data recorded.  General:  looks uncomfortable still, but just got OOB to chair.   Resp:  unlabored, normal rate CV:  RR&R Abd:  soft, non distended, incisional wound vac in place.  JPs serosang.  J tube in place.   Ext:  SCDs in place  Lab Results:  Recent Labs    07/24/24 0412  WBC 19.0*  HGB 11.7*  HCT 36.2  PLT 201   BMET Recent Labs    07/25/24 0400 07/26/24 0413  NA 136  137 136  136  K 4.2  4.2 4.5  4.4  CL 105  107 104  105  CO2 21*  21* 20*  21*  GLUCOSE 198*  195* 193*  196*  BUN 32*  31* 30*  30*  CREATININE 1.06*  1.11* 1.10*  0.95  CALCIUM  7.8*  7.8* 7.9*  7.9*   PT/INR No results for input(s): LABPROT, INR in the last 72 hours.  ABG No results for input(s): PHART, HCO3 in the last 72 hours.  Invalid input(s): PCO2, PO2   Studies/Results: No results found.   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  pT3bN1 final path.    Agree with trying to limit narcotics as much as possible given delirium, but patient will likely still need some given magnitude of surgery.  Scheduled robaxin , prn tramadol , schedule oral tylenol .  LFTs with bump in bilirubin not unexpected - stable. T bili stable at 2.   AKI improved Defer management of blood sugar to medicine.  Pt had generous panc head and uncinate process so lost a good amount of functional pancreas.    Anesthesia OK'd sq heparin  for vte ppx as long as platelets are OK.  Continue epidural for now.  Plan d/c POD 5 give or take a day.    TPN until patient tolerating J tube feeds.  Tube feeds to 30 today.    Patient had GOO for a while, so would not expect her stomach to function normally for a while  plan slow diet advance.         LOS: 22 days    Deward PARAS Mandi Mattioli 07/26/2024

## 2024-07-26 NOTE — Progress Notes (Addendum)
 [facility]  PROGRESS NOTE  Lauren Gray FMW:[id number] DOB: 09/24/1951 DOA: 07/04/2024 PCP: Lauren Thomes SAUNDERS, FNP   Brief HPI:   73 yo female presented 18 days ago on [facility] service 2/2 gastric outlet obstruction with findings of underlying duodenal cancer. On original presentation, pt complained of lack of PO intake + N/V that has been constant. Decreased BM but + flatus and also denied abdominal pain. Pt did endorse weightloss of ~10 pounds since June. Denies known sick contacts, no f/c. No sob/cp /dizziness. Denies etoh use or tobacco use. Pt underwent whipple procedure today.  The patient underwent a Whipple procedure on 07/22/2024. The patient was seen in the PACU following the procedure. She has tolerated the procedure well    Assessment/Plan:   Duodenal adenocarcinoma with gastric outlet obstruction CT scan showed dilated 1st and 2nd portion of duodenum with possible underlying mass. Patient was seen by gastroenterology and underwent small bowel enteroscopy.  The duodenal mass was biopsied.  Pathology was positive for adenocarcinoma. General surgery was consulted .  Patient has poor nutritional status.  Due to gastric outlet obstruction from the duodenal mass patient   started on TPN.   Due to persistent nausea and vomiting NG tube was inserted.   Chloraseptic spray and viscous lidocaine  can be used for mouth/throat discomfort.   Surgery (Whipple procedure) was performed on 07/22/2024.  - Continue Dilaudid  PCA -Patient is on postoperative epidural with ropivacaine , anesthesiology following - Discussed with family determined that patient will need IV Dilaudid  or morphine  as pain will not be able to controlled with epidural anesthesia.    - Continue Dilaudid  PCA   Hypokalemia/hypophosphatemia Resolved. Nutrition to be provided by TPN as per surgery.   Essential hypertension Blood pressure is elevated, continue scheduled IV metoprolol  5 mg every 6 hours -Continue hydralazine  as  needed -Will start amlodipine  5 mg daily   Situational anxiety Ativan  as needed.   Hyperglycemia -Currently on sliding scale insulin  with NovoLog , resistant sliding scale - Due to elevated blood sugar, Lantus  dose will be changed to 25 units subcu twice daily - CBG well-controlled   AKI on CKDII  Creatinine is now elevated at 1.38; likely in setting of dehydration - Improved after starting normal saline at 40 mL/h   Mild elevated lipase Resolved   Medications     sodium chloride    Intravenous Once   acetaminophen   1,000 mg Oral Q6H   Chlorhexidine  Gluconate Cloth  6 each Topical Q0600   fluticasone   2 spray Each Nare Daily   gabapentin   100 mg Oral TID   heparin  injection (subcutaneous)  5,000 Units Subcutaneous Q8H   HYDROmorphone    Intravenous Q4H   insulin  aspart  0-20 Units Subcutaneous Q4H   insulin  glargine  20 Units Subcutaneous BID   methocarbamol  (ROBAXIN ) injection  500 mg Intravenous Q8H   metoprolol  tartrate  5 mg Intravenous Q6H   pantoprazole  (PROTONIX ) IV  40 mg Intravenous Q12H     Data Reviewed:   CBG:  Recent Labs  Lab 07/25/24 1512 07/25/24 1941 07/25/24 2334 07/26/24 0329 07/26/24 0752  GLUCAP 220* 175* 206* 173* 185*    SpO2: 92 % O2 Flow Rate (L/min): 2 L/min FiO2 (%): 30 %    Vitals:   07/26/24 0430 07/26/24 0500 07/26/24 0600 07/26/24 0700  BP: (!) 158/46 (!) 159/54 (!) 139/49   Pulse: (!) 110 (!) 105 (!) 101 93  Resp: 20 20 18  (!) 24  Temp:      TempSrc:  SpO2: 92% 93% 93% 92%  Weight:      Height:          Data Reviewed:  Basic Metabolic Panel: Recent Labs  Lab 07/21/24 0609 07/22/24 0350 07/22/24 1118 07/23/24 0406 07/23/24 1232 07/24/24 0412 07/25/24 0400 07/26/24 0413  NA 139 140   < > 136 135 134* 136  137 136  136  K 3.8 3.7   < > 5.1 5.3* 4.9 4.2  4.2 4.5  4.4  CL 110 110  --  107 107 103 105  107 104  105  CO2 24 24  --  20* 22 22 21*  21* 20*  21*  GLUCOSE 146* 115*  --  361* 379* 344*  198*  195* 193*  196*  BUN 23 21  --  29* 34* 40* 32*  31* 30*  30*  CREATININE 1.07* 1.06*   < > 1.18* 1.57* 1.38* 1.06*  1.11* 1.10*  0.95  CALCIUM  8.5* 8.5*  --  7.9* 7.8* 8.1* 7.8*  7.8* 7.9*  7.9*  MG 1.8 2.0  --  1.9  --  2.3  --  2.2  PHOS 3.9  --   --  3.3  --  2.4* 2.7 3.2   < > = values in this interval not displayed.    CBC: Recent Labs  Lab 07/20/24 0326 07/22/24 0350 07/22/24 1118 07/22/24 1353 07/22/24 1552 07/23/24 0000 07/23/24 0406 07/24/24 0412  WBC 6.1 6.1  --   --   --  13.0* 12.6* 19.0*  NEUTROABS  --  3.7  --   --   --   --   --   --   HGB 10.8* 11.2*   < > 9.9* 9.9* 11.5* 11.5* 11.7*  HCT 34.8* 35.1*   < > 29.0* 29.0* 34.8* 35.8* 36.2  MCV 96.1 95.4  --   --   --  91.1 92.0 92.1  PLT 224 241  --   --   --  191 215 201   < > = values in this interval not displayed.    LFT Recent Labs  Lab 07/23/24 0406 07/23/24 1232 07/24/24 0412 07/25/24 0400 07/26/24 0413  AST 39 32 33 26 21  ALT 40 33 27 22 18   ALKPHOS 54 53 63 64 70  BILITOT 3.5* 2.8* 2.6* 1.9* 2.0*  PROT 5.0* 4.6* 5.4* 5.2* 5.7*  ALBUMIN  2.7* 2.3* 2.5* 2.0*  2.1* 1.9*  1.9*     Antibiotics: Anti-infectives (From admission, onward)    Start     Dose/Rate Route Frequency Ordered Stop   07/23/24 0000  ceFAZolin  (ANCEF ) IVPB 2g/100 mL premix        2 g 200 mL/hr over 30 Minutes Intravenous Every 8 hours 07/22/24 2314 07/23/24 0058   07/22/24 0900  ceFAZolin  (ANCEF ) IVPB 2g/100 mL premix        2 g 200 mL/hr over 30 Minutes Intravenous To ShortStay Surgical 07/21/24 1938 07/22/24 1432        DVT prophylaxis: Heparin   Code Status: Full code  Family Communication: Discussed with patient's family at bedside   CONSULTS General Surgery, gastroenterology   Subjective   Patient seen and examined, pain well-controlled.  Worked with therapy yesterday.  Started on clear liquid diet.  Objective    Physical Examination:   Appears in no acute distress S1-S2,  regular Lungs are clear to auscultation bilaterally Abdomen is soft, nontender, no organomegaly Extremities no edema   Status is: Inpatient:  Lauren Gray   [facility] If 7PM-7AM, please contact night-coverage at [email or web address], Office  [phone]   07/26/2024, 8:10 AM  LOS: 22 days

## 2024-07-26 NOTE — Progress Notes (Signed)
 Patient has been having hypertensive episodes off/on throughout the morning. PRN hydralazine , scheduled metoprolol  given, and PRN analgesia (Tramadol ) administered to help, with brief resolution.   Notified Dr. Sabas Brod, with [facility]. Verbal order for 5mg  of Amlodipine .   Emran Molzahn, RN

## 2024-07-27 ENCOUNTER — Inpatient Hospital Stay (HOSPITAL_COMMUNITY)

## 2024-07-27 DIAGNOSIS — C170 Malignant neoplasm of duodenum: Secondary | ICD-10-CM | POA: Diagnosis not present

## 2024-07-27 DIAGNOSIS — K56609 Unspecified intestinal obstruction, unspecified as to partial versus complete obstruction: Secondary | ICD-10-CM | POA: Diagnosis not present

## 2024-07-27 DIAGNOSIS — N179 Acute kidney failure, unspecified: Secondary | ICD-10-CM | POA: Diagnosis not present

## 2024-07-27 DIAGNOSIS — K5900 Constipation, unspecified: Secondary | ICD-10-CM | POA: Diagnosis not present

## 2024-07-27 LAB — COMPREHENSIVE METABOLIC PANEL WITH GFR
ALT: 19 U/L (ref 0–44)
AST: 20 U/L (ref 15–41)
Albumin: 1.6 g/dL — ABNORMAL LOW (ref 3.5–5.0)
Alkaline Phosphatase: 75 U/L (ref 38–126)
Anion gap: 9 (ref 5–15)
BUN: 35 mg/dL — ABNORMAL HIGH (ref 8–23)
CO2: 22 mmol/L (ref 22–32)
Calcium: 7.8 mg/dL — ABNORMAL LOW (ref 8.9–10.3)
Chloride: 104 mmol/L (ref 98–111)
Creatinine, Ser: 1.21 mg/dL — ABNORMAL HIGH (ref 0.44–1.00)
GFR, Estimated: 47 mL/min — ABNORMAL LOW (ref >60)
Glucose, Bld: 119 mg/dL — ABNORMAL HIGH (ref 70–99)
Potassium: 4.5 mmol/L (ref 3.5–5.1)
Sodium: 135 mmol/L (ref 135–145)
Total Bilirubin: 1.4 mg/dL — ABNORMAL HIGH (ref 0.0–1.2)
Total Protein: 5.7 g/dL — ABNORMAL LOW (ref 6.5–8.1)

## 2024-07-27 LAB — GLUCOSE, CAPILLARY
Glucose-Capillary: 113 mg/dL — ABNORMAL HIGH (ref 70–99)
Glucose-Capillary: 115 mg/dL — ABNORMAL HIGH (ref 70–99)
Glucose-Capillary: 122 mg/dL — ABNORMAL HIGH (ref 70–99)
Glucose-Capillary: 115 mg/dL — ABNORMAL HIGH (ref 70–99)
Glucose-Capillary: 112 mg/dL — ABNORMAL HIGH (ref 70–99)
Glucose-Capillary: 74 mg/dL (ref 70–99)

## 2024-07-27 LAB — AMYLASE, BODY FLUID (OTHER)
Amylase, Body Fluid: >75000 U/L
Amylase, Body Fluid: >75000 U/L

## 2024-07-27 MED ORDER — HEPARIN SODIUM (PORCINE) 5000 UNIT/ML IJ SOLN
5000.0000 [IU] | Freq: Three times a day (TID) | INTRAMUSCULAR | Status: DC
  Administered 2024-07-28 – 2024-07-31 (×10): 5000 [IU] via SUBCUTANEOUS
  Filled 2024-07-27 (×10): qty 1

## 2024-07-27 MED ORDER — INSULIN GLARGINE 100 UNIT/ML ~~LOC~~ SOLN
20.0000 [IU] | Freq: Two times a day (BID) | SUBCUTANEOUS | Status: DC
  Administered 2024-07-27: 20 [IU] via SUBCUTANEOUS
  Filled 2024-07-27 (×3): qty 0.2

## 2024-07-27 MED ORDER — MORPHINE SULFATE (PF) 2 MG/ML IV SOLN
2.0000 mg | INTRAVENOUS | Status: DC | PRN
  Administered 2024-07-27 – 2024-08-07 (×18): 2 mg via INTRAVENOUS
  Filled 2024-07-27 (×18): qty 1

## 2024-07-27 MED ORDER — OSMOLITE 1.5 CAL PO LIQD
1000.0000 mL | ORAL | Status: DC
  Administered 2024-07-28: 1000 mL

## 2024-07-27 MED ORDER — ACETAMINOPHEN 10 MG/ML IV SOLN
1000.0000 mg | Freq: Four times a day (QID) | INTRAVENOUS | Status: AC
  Administered 2024-07-27 – 2024-07-28 (×4): 1000 mg via INTRAVENOUS
  Filled 2024-07-27 (×4): qty 100

## 2024-07-27 MED ORDER — HYDRALAZINE HCL 20 MG/ML IJ SOLN
10.0000 mg | INTRAMUSCULAR | Status: DC | PRN
  Administered 2024-07-27 – 2024-07-28 (×3): 10 mg via INTRAVENOUS
  Filled 2024-07-27 (×3): qty 1

## 2024-07-27 MED ORDER — PROCHLORPERAZINE EDISYLATE 10 MG/2ML IJ SOLN
10.0000 mg | INTRAMUSCULAR | Status: DC | PRN
Start: 2024-07-27 — End: 2024-07-31
  Administered 2024-07-27: 10 mg via INTRAVENOUS
  Filled 2024-07-27: qty 2

## 2024-07-27 MED ORDER — TRAVASOL 10 % IV SOLN
INTRAVENOUS | Status: DC
  Filled 2024-07-27: qty 954

## 2024-07-27 MED ORDER — MORPHINE SULFATE (PF) 2 MG/ML IV SOLN: 2.0000 mg | INTRAVENOUS | Status: DC | PRN

## 2024-07-27 NOTE — Progress Notes (Signed)
 Epidural removed by anesthesia at bedside, no complications.

## 2024-07-27 NOTE — Progress Notes (Signed)
 Scant bleeding from epidural site, dressing marked.

## 2024-07-27 NOTE — Progress Notes (Signed)
 Patient endorses anxiety. Primary RN offered PRN medication, pt family refused.

## 2024-07-27 NOTE — Plan of Care (Signed)
   Problem: Education: Goal: Knowledge of General Education information will improve Description Including pain rating scale, medication(s)/side effects and non-pharmacologic comfort measures Outcome: Progressing   Problem: Health Behavior/Discharge Planning: Goal: Ability to manage health-related needs will improve Outcome: Progressing

## 2024-07-27 NOTE — Progress Notes (Signed)
 Verbal order to adjust enteral nutrition rate 10 ml/hr.

## 2024-07-27 NOTE — Progress Notes (Signed)
 Family in the room for Xray, advised she should step out. Daughter refused. Informed of risks, continues to refuses exiting.

## 2024-07-27 NOTE — Progress Notes (Signed)
 Verbal order to change drain to JP by Dr. Lyndel.

## 2024-07-27 NOTE — Progress Notes (Signed)
   07/27/24 1621  Vitals  Pulse Rate (!) 102  ECG Heart Rate (!) 103  Resp (!) 32  Oxygen Therapy  SpO2 94 %  MEWS Score  MEWS Temp 0  MEWS Systolic 0  MEWS Pulse 1  MEWS RR 2  MEWS LOC 0  MEWS Score 3  MEWS Score Color Yellow     At approximately 1621, patient had an episode of emesis where 300 CC of stool were suctioned with yankauer.    Patient became diaphoretic, tachycardic, hypertensive and hyperthermic. PRN Zofran , Hydralazine  and Morphine  were administered.   NGT was placed per surgical team to LIS.   Chest/Abdomen Xray were ordered stat.    See new orders.

## 2024-07-27 NOTE — Progress Notes (Signed)
 [facility]  PROGRESS NOTE  Lauren Gray FMW:[id number] DOB: December 16, 1950 DOA: 07/04/2024 PCP: Juanice Thomes SAUNDERS, FNP   Brief HPI:   73 yo female presented 18 days ago on [facility] service 2/2 gastric outlet obstruction with findings of underlying duodenal cancer. On original presentation, pt complained of lack of PO intake + N/V that has been constant. Decreased BM but + flatus and also denied abdominal pain. Pt did endorse weightloss of ~10 pounds since June. Denies known sick contacts, no f/c. No sob/cp /dizziness. Denies etoh use or tobacco use. Pt underwent whipple procedure today.  The patient underwent a Whipple procedure on 07/22/2024. The patient was seen in the PACU following the procedure. She has tolerated the procedure well    Assessment/Plan:   Duodenal adenocarcinoma with gastric outlet obstruction CT scan showed dilated 1st and 2nd portion of duodenum with possible underlying mass. Patient was seen by gastroenterology and underwent small bowel enteroscopy.  The duodenal mass was biopsied.  Pathology was positive for adenocarcinoma. General surgery was consulted .  Patient has poor nutritional status.  Due to gastric outlet obstruction from the duodenal mass patient   started on TPN.   Due to persistent nausea and vomiting NG tube was inserted.   Chloraseptic spray and viscous lidocaine  can be used for mouth/throat discomfort.   Surgery (Whipple procedure) was performed on 07/22/2024.  - Continue Dilaudid  PCA -Patient is on postoperative epidural with ropivacaine , anesthesiology following - Discussed with family determined that patient will need IV Dilaudid  or morphine  as pain will not be able to controlled with epidural anesthesia.    - Continue Dilaudid  PCA  Vomiting -Unable to tolerate tube feeds; vomiting this morning -Tube feed has been cut down  to 20 mL/h due to vomiting -General Surgery following   Hypokalemia/hypophosphatemia Resolved. Nutrition to be provided by TPN  as per surgery.   Essential hypertension Blood pressure is elevated, continue scheduled IV metoprolol  5 mg every 6 hours -Continue hydralazine  as needed -Will start amlodipine  5 mg daily   Situational anxiety Ativan  as needed.   Hyperglycemia -Currently on sliding scale insulin  with NovoLog , resistant sliding scale - Due to elevated blood sugar, Lantus  dose will be changed to 25 units subcu twice daily - CBG well-controlled   AKI on CKDII  Creatinine is now elevated at 1.38; likely in setting of dehydration - Improved after starting normal saline at 40 mL/h   Mild elevated lipase Resolved   Medications     sodium chloride    Intravenous Once   acetaminophen   1,000 mg Oral Q6H   Or   acetaminophen   1,000 mg Per J Tube Q6H   amLODipine   5 mg Per J Tube Daily   Chlorhexidine  Gluconate Cloth  6 each Topical Q0600   feeding supplement (OSMOLITE 1.5 CAL)  1,000 mL Per Tube Q24H   fluticasone   2 spray Each Nare Daily   gabapentin   100 mg Oral TID   heparin  injection (subcutaneous)  5,000 Units Subcutaneous Q8H   HYDROmorphone    Intravenous Q4H   insulin  aspart  0-20 Units Subcutaneous Q4H   insulin  glargine  25 Units Subcutaneous BID   methocarbamol  (ROBAXIN ) injection  500 mg Intravenous Q8H   metoprolol  tartrate  5 mg Intravenous Q6H   pantoprazole  (PROTONIX ) IV  40 mg Intravenous Q12H     Data Reviewed:   CBG:  Recent Labs  Lab 07/26/24 1627 07/26/24 1927 07/26/24 2323 07/27/24 0331 07/27/24 0714  GLUCAP 176* 188* 155* 113* 115*  SpO2: 95 % O2 Flow Rate (L/min): 2 L/min FiO2 (%): 30 %    Vitals:   07/27/24 0500 07/27/24 0600 07/27/24 0619 07/27/24 0758  BP: (!) 171/52 (!) 167/58 (!) 167/58   Pulse: 100 93    Resp: (!) 27 19    Temp:    98.7 F (37.1 C)  TempSrc:    Oral  SpO2: 95% 95%    Weight: 120.4 kg     Height:          Data Reviewed:  Basic Metabolic Panel: Recent Labs  Lab 07/21/24 0609 07/22/24 0350 07/22/24 1118 07/23/24 0406  07/23/24 1232 07/24/24 0412 07/25/24 0400 07/26/24 0413 07/27/24 0513  NA 139 140   < > 136 135 134* 136  137 136  136 135  K 3.8 3.7   < > 5.1 5.3* 4.9 4.2  4.2 4.5  4.4 4.5  CL 110 110  --  107 107 103 105  107 104  105 104  CO2 24 24  --  20* 22 22 21*  21* 20*  21* 22  GLUCOSE 146* 115*  --  361* 379* 344* 198*  195* 193*  196* 119*  BUN 23 21  --  29* 34* 40* 32*  31* 30*  30* 35*  CREATININE 1.07* 1.06*   < > 1.18* 1.57* 1.38* 1.06*  1.11* 1.10*  0.95 1.21*  CALCIUM  8.5* 8.5*  --  7.9* 7.8* 8.1* 7.8*  7.8* 7.9*  7.9* 7.8*  MG 1.8 2.0  --  1.9  --  2.3  --  2.2  --   PHOS 3.9  --   --  3.3  --  2.4* 2.7 3.2  --    < > = values in this interval not displayed.    CBC: Recent Labs  Lab 07/22/24 0350 07/22/24 1118 07/22/24 1353 07/22/24 1552 07/23/24 0000 07/23/24 0406 07/24/24 0412  WBC 6.1  --   --   --  13.0* 12.6* 19.0*  NEUTROABS 3.7  --   --   --   --   --   --   HGB 11.2*   < > 9.9* 9.9* 11.5* 11.5* 11.7*  HCT 35.1*   < > 29.0* 29.0* 34.8* 35.8* 36.2  MCV 95.4  --   --   --  91.1 92.0 92.1  PLT 241  --   --   --  191 215 201   < > = values in this interval not displayed.    LFT Recent Labs  Lab 07/23/24 1232 07/24/24 0412 07/25/24 0400 07/26/24 0413 07/27/24 0513  AST 32 33 26 21 20   ALT 33 27 22 18 19   ALKPHOS 53 63 64 70 75  BILITOT 2.8* 2.6* 1.9* 2.0* 1.4*  PROT 4.6* 5.4* 5.2* 5.7* 5.7*  ALBUMIN  2.3* 2.5* 2.0*  2.1* 1.9*  1.9* 1.6*     Antibiotics: Anti-infectives (From admission, onward)    Start     Dose/Rate Route Frequency Ordered Stop   07/23/24 0000  ceFAZolin  (ANCEF ) IVPB 2g/100 mL premix        2 g 200 mL/hr over 30 Minutes Intravenous Every 8 hours 07/22/24 2314 07/23/24 0058   07/22/24 0900  ceFAZolin  (ANCEF ) IVPB 2g/100 mL premix        2 g 200 mL/hr over 30 Minutes Intravenous To ShortStay Surgical 07/21/24 1938 07/22/24 1432        DVT prophylaxis: Heparin   Code Status: Full code  Family Communication:  Discussed  with patient's family at bedside   CONSULTS General Surgery, gastroenterology   Subjective   Patient seen and examined, vomited this morning.  Tube feeds cut down to 20 cc/h.  Objective    Physical Examination:   Appears in no acute distress S1-S2, regular, no murmur auscultated Lungs clear to auscultation bilaterally Abdomen is soft, nontender, no organomegaly   Status is: Inpatient:             Lauren Gray   [facility] If 7PM-7AM, please contact night-coverage at [email or web address], Office  [phone]   07/27/2024, 8:06 AM  LOS: 23 days

## 2024-07-27 NOTE — Progress Notes (Signed)
 Patient assisted back in the bed with +3, tolerated it poorly.   Asking for relief from pain. Primary team informed.

## 2024-07-27 NOTE — Progress Notes (Signed)
 PHARMACY - TOTAL PARENTERAL NUTRITION CONSULT NOTE  Indication: Gastric outlet obstruction 2nd to mass   Patient Measurements: Height: 5' 8 (172.7 cm) Weight: 120.4 kg (265 lb 6.9 oz) IBW/kg (Calculated) : 63.9 TPN AdjBW (KG): 74.7 Body mass index is 40.36 kg/m.   Assessment:  Pharmacy consulted to manage TPN for nutrition support in [name] with gastric outlet obstruction 2nd mass.  PMH: HLD, prediabetes, CKDII does not take any medication on regular basis, past surgical h/o S/P total hysterectomy and BSO   Glucose / Insulin : hx preDM (A1c 6.1 in Aug 2025) CBGs acceptable Decadron  5mg  given 9/9.  Required a significant amount of insulin  since Whipple procedure; previous TPNs contained up to 60 units of insulin  and CBGs were controlled. Used 24 units rSSI, Lantus  25 units SQ BID, 90 units insulin  in TPN Electrolytes: all WNL Renal: SCr up 1.21, BUN up to 35 Hepatic: LFTs / TG WNL, tbili down to 1.4 (no jaundice), albumin  1.9 I/O: UOP 0.4 ml/kg/hr, drain , LBM 9/8, vomited stool on 9/14 GI Imaging:  8/22 CT: SBO from mass 8/25 AXR: duodenal stricture suspicious for mass & malignancy 8/28 AXR: Nonobstructive bowel gas pattern.  GI Surgeries / Procedures:  8/23 SB enteroscopy: esophagitis; invasive adenocarcinoma 9/9 Whipple procedure  Central access: PICC placed 07/08/24 TPN start date: 07/08/2024  Nutritional Goals:   TPN at 75 ml/hr provides 95g AA and 1717 kCal per day  RD Estimated Needs Total Energy Estimated Needs: 1700-1900 Total Protein Estimated Needs: 75-95 grams Total Fluid Estimated Needs: >/= 1.9 L   Current Nutrition:  TPN FLD started 9/13 Osmolite 1.5 via J-tube reduce to 10 ml/hr d/t vomiting (goal rate 50 ml/hr)  Plan:  Continue TPN at 75 ml/hr to meet 100% of needs Electrolytes in TPN: increase Na to 137mEq/L, reduce K to 71mEq/L, Ca 7 mEq/L, Mg 7 mEq/L, Phos 30 mmol/L, max acetate Add standard MVI and trace elements to TPN Continue resistant  SSI Q4H and 90 units regular insulin  in TPN Reduce Lantus  slightly to 20 units SQ BID  Monitor TPN labs on Mon/Thurs  Sitara Cashwell D. Lendell, PharmD, BCPS, BCCCP 07/27/2024, 12:26 PM

## 2024-07-27 NOTE — Addendum Note (Signed)
 Addendum  created 07/27/24 1331 by Erma Thom SAUNDERS, MD   Clinical Note Signed

## 2024-07-27 NOTE — Progress Notes (Signed)
 5 Days Post-Op   Subjective/Chief Complaint: Large amount of vomit last night.  Ongoing bile leakage from around J tube   Objective: Vital signs in last 24 hours: Temp:  [98.7 F (37.1 C)-99 F (37.2 C)] 98.7 F (37.1 C) (09/14 0758) Pulse Rate:  [89-106] 106 (09/14 1000) Resp:  [16-38] 22 (09/14 1000) BP: (136-182)/(52-74) 176/59 (09/14 1000) SpO2:  [90 %-97 %] 93 % (09/14 1000) Weight:  [120.4 kg] 120.4 kg (09/14 0500) Last BM Date : 07/18/24  Intake/Output from previous day: 09/13 0701 - 09/14 0700 In: 2637.6 [I.V.:1714.1; NG/GT:647.5] Out: 1375 [Urine:1050; Drains:325] Intake/Output this shift: Total I/O In: 234 [I.V.:150; Other:24; NG/GT:60] Out: 450 [Urine:250; Emesis/NG output:200]  General:  looks uncomfortable still, but just got OOB to chair.   Resp:  unlabored, normal rate CV:  RR&R Abd:  soft, non distended, incisional wound vac in place.  JPs serosang.  J tube in place.   Ext:  SCDs in place  Lab Results:  No results for input(s): WBC, HGB, HCT, PLT in the last 72 hours.  BMET Recent Labs    07/26/24 0413 07/27/24 0513  NA 136  136 135  K 4.5  4.4 4.5  CL 104  105 104  CO2 20*  21* 22  GLUCOSE 193*  196* 119*  BUN 30*  30* 35*  CREATININE 1.10*  0.95 1.21*  CALCIUM  7.9*  7.9* 7.8*   PT/INR No results for input(s): LABPROT, INR in the last 72 hours.  ABG No results for input(s): PHART, HCO3 in the last 72 hours.  Invalid input(s): PCO2, PO2   Studies/Results: No results found.   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  pT3bN1 final path.    Agree with trying to limit narcotics as much as possible given delirium, but patient will likely still need some given magnitude of surgery - will try 24 hours IV tylenol  as patient likely not absorbing PO meds well at this point and need something to give for pain that she will tolerate Scheduled robaxin , prn tramadol , reschedule oral tylenol  once tolerating PO.  LFTs look good today T.Bili 1.4.   AKI 1.2 Defer management of blood sugar to medicine.  Pt had generous panc head and uncinate process so lost a good amount of functional pancreas.    Anesthesia OK'd sq heparin  for vte ppx as long as platelets are OK.  Continue epidural for now.  Plan d/c POD 5 give or take a day.    TPN until patient tolerating J tube feeds.  Tried tube feeds at 30 on Saturday - patient didn't tolerate with bloating, back down to 20 today.    Patient had GOO for a while, so would not expect her stomach to function normally for a while, plan slow diet advance.  Limit to clears today due to vomiting last night.       LOS: 23  days    Deward PARAS Cecil Vandyke 07/27/2024

## 2024-07-27 NOTE — Anesthesia Post-op Follow-up Note (Signed)
  Anesthesia Pain Follow-up Note  Patient: Lauren Gray  Day #: 5  Date of Follow-up: 07/27/2024 Time: 1:29 PM  Last Vitals:  Vitals:   07/27/24 1200 07/27/24 1300  BP: (!) 81/68 (!) 142/63  Pulse: 93 96  Resp: (!) 25 (!) 27  Temp:    SpO2: 94% 93%    Level of Consciousness: alert  Pain: mild   Side Effects:None  Catheter Site Exam:clean, dry, no drainage. Dressing in place  Anti-Coag Meds (From admission, onward)    Start     Dose/Rate Route Frequency Ordered Stop   07/23/24 0600  heparin  injection 5,000 Units       Note to Pharmacy: Discussed with anesthesia.  Ok if platelets are ok.   5,000 Units Subcutaneous Every 8 hours 07/22/24 2314        Epidural / Intrathecal (From admission, onward)    Start     Dose/Rate Route Frequency Ordered Stop   07/23/24 0000  ropivacaine  (PF) 2 mg/mL (0.2%) (NAROPIN ) injection        12 mL/hr 12 mL/hr  Epidural Continuous 07/22/24 2313          Plan: Continue current therapy of postop epidural at surgeon's request Per surgeon's note, plan to D/C on POD5  Thom JONELLE Peoples     Anesthesia Pain Follow-up Note  Patient: Lauren Gray  Day #: 5  Date of Follow-up: 07/27/2024 Time: 1:29 PM  Last Vitals:  Vitals:   07/27/24 1200 07/27/24 1300  BP: (!) 81/68 (!) 142/63  Pulse: 93 96  Resp: (!) 25 (!) 27  Temp:    SpO2: 94% 93%    Level of Consciousness: alert  Pain: mild   Side Effects:None  Catheter Site Exam:clean, dry  Anti-Coag Meds (From admission, onward)    Start     Dose/Rate Route Frequency Ordered Stop   07/23/24 0600  heparin  injection 5,000 Units       Note to Pharmacy: Discussed with anesthesia.  Ok if platelets are ok.   5,000 Units Subcutaneous Every 8 hours 07/22/24 2314        Epidural / Intrathecal (From admission, onward)    Start     Dose/Rate Route Frequency Ordered Stop   07/23/24 0000  ropivacaine  (PF) 2 mg/mL (0.2%) (NAROPIN ) injection        12 mL/hr 12 mL/hr  Epidural  Continuous 07/22/24 2313          Plan: Catheter removed/tip intact at surgeon's request and D/C from anesthesia care at surgeon's request  Thom JONELLE Peoples

## 2024-07-27 NOTE — Progress Notes (Signed)
 Patient had an episode of emesis, all stool contents. .   Full linen change, bathed, mouth care, suction equipment changed.   Surgical team informed.

## 2024-07-28 DIAGNOSIS — C170 Malignant neoplasm of duodenum: Secondary | ICD-10-CM | POA: Diagnosis not present

## 2024-07-28 DIAGNOSIS — K56609 Unspecified intestinal obstruction, unspecified as to partial versus complete obstruction: Secondary | ICD-10-CM | POA: Diagnosis not present

## 2024-07-28 DIAGNOSIS — K5900 Constipation, unspecified: Secondary | ICD-10-CM | POA: Diagnosis not present

## 2024-07-28 DIAGNOSIS — N179 Acute kidney failure, unspecified: Secondary | ICD-10-CM | POA: Diagnosis not present

## 2024-07-28 LAB — COMPREHENSIVE METABOLIC PANEL WITH GFR
ALT: 21 U/L (ref 0–44)
AST: 24 U/L (ref 15–41)
Albumin: 1.6 g/dL — ABNORMAL LOW (ref 3.5–5.0)
Alkaline Phosphatase: 89 U/L (ref 38–126)
Anion gap: 14 (ref 5–15)
BUN: 27 mg/dL — ABNORMAL HIGH (ref 8–23)
CO2: 23 mmol/L (ref 22–32)
Calcium: 7.9 mg/dL — ABNORMAL LOW (ref 8.9–10.3)
Chloride: 102 mmol/L (ref 98–111)
Creatinine, Ser: 0.99 mg/dL (ref 0.44–1.00)
GFR, Estimated: >60 mL/min (ref >60)
Glucose, Bld: 68 mg/dL — ABNORMAL LOW (ref 70–99)
Potassium: 4.3 mmol/L (ref 3.5–5.1)
Sodium: 139 mmol/L (ref 135–145)
Total Bilirubin: 1.5 mg/dL — ABNORMAL HIGH (ref 0.0–1.2)
Total Protein: 5.7 g/dL — ABNORMAL LOW (ref 6.5–8.1)

## 2024-07-28 LAB — CBC
HCT: 27.5 % — ABNORMAL LOW (ref 36.0–46.0)
Hemoglobin: 9.1 g/dL — ABNORMAL LOW (ref 12.0–15.0)
MCH: 29.8 pg (ref 26.0–34.0)
MCHC: 33.1 g/dL (ref 30.0–36.0)
MCV: 90.2 fL (ref 80.0–100.0)
Platelets: 281 K/uL (ref 150–400)
RBC: 3.05 MIL/uL — ABNORMAL LOW (ref 3.87–5.11)
RDW: 15.4 % (ref 11.5–15.5)
WBC: 19.6 K/uL — ABNORMAL HIGH (ref 4.0–10.5)
nRBC: 0.2 % (ref 0.0–0.2)

## 2024-07-28 LAB — MAGNESIUM: Magnesium: 2.3 mg/dL (ref 1.7–2.4)

## 2024-07-28 LAB — GLUCOSE, CAPILLARY
Glucose-Capillary: 69 mg/dL — ABNORMAL LOW (ref 70–99)
Glucose-Capillary: 92 mg/dL (ref 70–99)
Glucose-Capillary: 61 mg/dL — ABNORMAL LOW (ref 70–99)
Glucose-Capillary: 78 mg/dL (ref 70–99)
Glucose-Capillary: 98 mg/dL (ref 70–99)
Glucose-Capillary: 151 mg/dL — ABNORMAL HIGH (ref 70–99)
Glucose-Capillary: 181 mg/dL — ABNORMAL HIGH (ref 70–99)

## 2024-07-28 LAB — TRIGLYCERIDES: Triglycerides: 207 mg/dL — ABNORMAL HIGH (ref <150)

## 2024-07-28 LAB — PHOSPHORUS: Phosphorus: 4.3 mg/dL (ref 2.5–4.6)

## 2024-07-28 MED ORDER — HYDRALAZINE HCL 20 MG/ML IJ SOLN
10.0000 mg | INTRAMUSCULAR | Status: DC | PRN
  Administered 2024-07-28 – 2024-07-31 (×9): 20 mg via INTRAVENOUS
  Administered 2024-08-05: 10 mg via INTRAVENOUS
  Filled 2024-07-28 (×10): qty 1

## 2024-07-28 MED ORDER — DEXTROSE 50 % IV SOLN
25.0000 mL | Freq: Once | INTRAVENOUS | Status: AC
Start: 2024-07-28 — End: 2024-07-28
  Administered 2024-07-28: 25 mL via INTRAVENOUS

## 2024-07-28 MED ORDER — TRAVASOL 10 % IV SOLN
INTRAVENOUS | Status: AC
  Filled 2024-07-28: qty 954

## 2024-07-28 MED ORDER — OXYCODONE HCL 5 MG/5ML PO SOLN
5.0000 mg | Freq: Four times a day (QID) | ORAL | Status: DC | PRN
Start: 2024-07-28 — End: 2024-08-10
  Administered 2024-07-28 – 2024-08-10 (×14): 5 mg
  Filled 2024-07-28 (×15): qty 5

## 2024-07-28 MED ORDER — PIPERACILLIN-TAZOBACTAM 3.375 G IVPB
3.3750 g | Freq: Three times a day (TID) | INTRAVENOUS | Status: DC
  Administered 2024-07-28 – 2024-07-30 (×7): 3.375 g via INTRAVENOUS
  Filled 2024-07-28 (×7): qty 50

## 2024-07-28 MED ORDER — FENTANYL CITRATE PF 50 MCG/ML IJ SOSY: 50.0000 ug | PREFILLED_SYRINGE | Freq: Once | INTRAMUSCULAR | Status: DC

## 2024-07-28 MED ORDER — DEXTROSE 10 % IV SOLN: INTRAVENOUS | Status: AC

## 2024-07-28 MED ORDER — GABAPENTIN 250 MG/5ML PO SOLN
100.0000 mg | Freq: Three times a day (TID) | ORAL | Status: DC
  Administered 2024-07-28 – 2024-08-03 (×19): 100 mg
  Filled 2024-07-28 (×22): qty 2

## 2024-07-28 MED ORDER — DEXTROSE 50 % IV SOLN
12.5000 g | INTRAVENOUS | Status: AC
  Administered 2024-07-28: 12.5 g via INTRAVENOUS

## 2024-07-28 MED ORDER — DEXTROSE 50 % IV SOLN
INTRAVENOUS | Status: AC
  Filled 2024-07-28: qty 50

## 2024-07-28 NOTE — Progress Notes (Signed)
 6 Days Post-Op   Subjective/Chief Complaint: NGT replaced yesterday for emesis.  Having bile leaking around J tube.  Tube feeds held after emesis. Drain bags switched to bulbs by weekend team.    Objective: Vital signs in last 24 hours: Temp:  [97.9 F (36.6 C)-100.1 F (37.8 C)] 98.2 F (36.8 C) (09/15 0400) Pulse Rate:  [92-119] 100 (09/15 0827) Resp:  [20-34] 29 (09/15 0827) BP: (81-198)/(54-101) 164/56 (09/15 0827) SpO2:  [90 %-97 %] 91 % (09/15 0827) Last BM Date : 07/18/24  Intake/Output from previous day: 09/14 0701 - 09/15 0700 In: 2272.7 [I.V.:1018.7; NG/GT:720; IV Piggyback:400] Out: 2750 [Urine:1950; Emesis/NG output:500; Drains:300] Intake/Output this shift: Total I/O In: 947.5 [I.V.:147.5; IV Piggyback:800] Out: -   General: NGT with bile output Resp:  unlabored, normal rate CV:  RR&R Abd:  soft, non distended.  One JP looks like old blood, one looks murky serous.  J tube in place.   Ext:  SCDs in place  Lab Results:  Recent Labs    07/28/24 0600  WBC 19.6*  HGB 9.1*  HCT 27.5*  PLT 281    BMET Recent Labs    07/27/24 0513 07/28/24 0600  NA 135 139  K 4.5 4.3  CL 104 102  CO2 22 23  GLUCOSE 119* 68*  BUN 35* 27*  CREATININE 1.21* 0.99  CALCIUM  7.8* 7.9*   PT/INR No results for input(s): LABPROT, INR in the last 72 hours.  ABG No results for input(s): PHART, HCO3 in the last 72 hours.  Invalid input(s): PCO2, PO2   Studies/Results: DG CHEST PORT 1 VIEW Result Date: 07/27/2024 CLINICAL DATA:  Vomiting EXAM: PORTABLE CHEST 1 VIEW COMPARISON:  07/24/2024 FINDINGS: Right PICC line tip: SVC.  Nasogastric tube tip in the stomach body. Low lung volumes are present, causing crowding of the pulmonary vasculature. Bandlike density at the right lung base potentially from atelectasis or scarring. Central retrocardiac density compatible with small hiatal hernia. No blunting of the costophrenic angles. Heart size within normal limits. No  significant skeletal abnormality observed. IMPRESSION: 1. Low lung volumes are present, causing crowding of the pulmonary vasculature. 2. Bandlike density at the right lung base potentially from atelectasis or scarring. 3. Small hiatal hernia. 4. Nasogastric tube tip in the stomach body. Electronically Signed   By: Ryan Salvage M.D.   On: 07/27/2024 17:43   DG Abd 1 View Result Date: 07/27/2024 CLINICAL DATA:  Nasogastric tube placement EXAM: ABDOMEN - 1 VIEW COMPARISON:  07/10/2024 FINDINGS: Nasogastric tube tip and side port are in the stomach body. Midline skin staples in the upper abdomen. Contrast medium noted in the colon. IMPRESSION: 1. Nasogastric tube tip and side port are in the stomach body. Electronically Signed   By: Ryan Salvage M.D.   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  pT3bN1 final path.  Try to limit narcotics as much as possible given delirium  Continue ofirmev  24 hours started yesterday.   Scheduled robaxin , prn tramadol , reschedule oral tylenol  once tolerating PO.  T bili down to 1.5, stabilizing.  AKI, appears improved/resolved. ABL anemia Hypoglycemia - pharmacy taking down TPN due to amount of insulin  in the TPN, doing temporary D10 until tonight's bag.    Defer management of blood sugar/HTN to medicine.  Pt had generous panc head and uncinate process so lost a good  amount of functional pancreas.    Epidural d/c'd.    TPN until patient tolerating J tube feeds. Try restarting trickle feeds again today.    Patient had GOO for a while, so would not expect her stomach to function normally for a while.  When we take out NGT again (maybe tomorrow) will just stick to sips/clears.         LOS: 24 days   Lauren Gray Nephew, MD, FACS, FSSO Surgical Oncology, General Surgery, Trauma and Critical [facility] Surgery, [place] [phone] for weekday/non holidays Check [email or web address] for coverage night/weekend/holidays

## 2024-07-28 NOTE — Progress Notes (Signed)
 [facility]  PROGRESS NOTE  Lauren Gray FMW:[id number] DOB: [birth year] DOA: 07/04/2024 PCP: Juanice Thomes SAUNDERS, FNP   Brief HPI:   73 yo female presented 18 days ago on [facility] service 2/2 gastric outlet obstruction with findings of underlying duodenal cancer. On original presentation, pt complained of lack of PO intake + N/V that has been constant. Decreased BM but + flatus and also denied abdominal pain. Pt did endorse weightloss of ~10 pounds since June. Denies known sick contacts, no f/c. No sob/cp /dizziness. Denies etoh use or tobacco use. Pt underwent whipple procedure today.  The patient underwent a Whipple procedure on 07/22/2024. The patient was seen in the PACU following the procedure. She has tolerated the procedure well    Assessment/Plan:   Duodenal adenocarcinoma with gastric outlet obstruction CT scan showed dilated 1st and 2nd portion of duodenum with possible underlying mass. Patient was seen by gastroenterology and underwent small bowel enteroscopy.  The duodenal mass was biopsied.  Pathology was positive for adenocarcinoma. General surgery was consulted .  Patient has poor nutritional status.  Due to gastric outlet obstruction from the duodenal mass patient   started on TPN.   Due to persistent nausea and vomiting NG tube was inserted.   Chloraseptic spray and viscous lidocaine  can be used for mouth/throat discomfort.   Surgery (Whipple procedure) was performed on 07/22/2024.  - Continue Dilaudid  PCA -Patient is on postoperative epidural with ropivacaine , anesthesiology following - Discussed with family determined that patient will need IV Dilaudid  or morphine  as pain will not be able to controlled with epidural anesthesia.    - Continue Dilaudid  PCA  Vomiting -Unable to tolerate tube feeds; developed vomiting on 07/27/2024 -Tube feed discontinued, NG tube reinserted -General Surgery following   Hypokalemia/hypophosphatemia Resolved. Nutrition to be provided by TPN as  per surgery.   Essential hypertension Blood pressure is elevated, continue scheduled IV metoprolol  5 mg every 6 hours -Continue hydralazine  as needed    Situational anxiety Ativan  as needed.   Hyperglycemia -Currently on sliding scale insulin  with NovoLog , resistant sliding scale - Due to elevated blood sugar, Lantus  dose will be changed to 25 units subcu twice daily - As tube feeds stopped, will discontinue Lantus    AKI on CKDII  Creatinine is now elevated at 1.38; likely in setting of dehydration - Improved after starting normal saline at 40 mL/h   Mild elevated lipase Resolved   Medications     sodium chloride    Intravenous Once   amLODipine   5 mg Per J Tube Daily   Chlorhexidine  Gluconate Cloth  6 each Topical Q0600   feeding supplement (OSMOLITE 1.5 CAL)  1,000 mL Per Tube Q24H   fluticasone   2 spray Each Nare Daily   gabapentin   100 mg Oral TID   heparin  injection (subcutaneous)  5,000 Units Subcutaneous Q8H   insulin  aspart  0-20 Units Subcutaneous Q4H   insulin  glargine  20 Units Subcutaneous BID   methocarbamol  (ROBAXIN ) injection  500 mg Intravenous Q8H   metoprolol  tartrate  5 mg Intravenous Q6H   pantoprazole  (PROTONIX ) IV  40 mg Intravenous Q12H     Data Reviewed:   CBG:  Recent Labs  Lab 07/27/24 1938 07/27/24 2301 07/28/24 0324 07/28/24 0440 07/28/24 0715  GLUCAP 112* 74 69* 92 61*    SpO2: 95 % O2 Flow Rate (L/min): 2 L/min FiO2 (%): 30 %    Vitals:   07/28/24 0300 07/28/24 0400 07/28/24 0500 07/28/24 0600  BP: (!) 159/75 (!) 156/72 ROLLEN)  160/76 (!) 136/101  Pulse: (!) 105 (!) 119 (!) 108 92  Resp: (!) 26 (!) 24 (!) 24 (!) 30  Temp:  98.2 F (36.8 C)    TempSrc:  Oral    SpO2: 96% 91% 93% 95%  Weight:      Height:          Data Reviewed:  Basic Metabolic Panel: Recent Labs  Lab 07/22/24 0350 07/22/24 1118 07/23/24 0406 07/23/24 1232 07/24/24 0412 07/25/24 0400 07/26/24 0413 07/27/24 0513 07/28/24 0600  NA 140   < >  136   < > 134* 136  137 136  136 135 139  K 3.7   < > 5.1   < > 4.9 4.2  4.2 4.5  4.4 4.5 4.3  CL 110  --  107   < > 103 105  107 104  105 104 102  CO2 24  --  20*   < > 22 21*  21* 20*  21* 22 23  GLUCOSE 115*  --  361*   < > 344* 198*  195* 193*  196* 119* 68*  BUN 21  --  29*   < > 40* 32*  31* 30*  30* 35* 27*  CREATININE 1.06*   < > 1.18*   < > 1.38* 1.06*  1.11* 1.10*  0.95 1.21* 0.99  CALCIUM  8.5*  --  7.9*   < > 8.1* 7.8*  7.8* 7.9*  7.9* 7.8* 7.9*  MG 2.0  --  1.9  --  2.3  --  2.2  --  2.3  PHOS  --   --  3.3  --  2.4* 2.7 3.2  --  4.3   < > = values in this interval not displayed.    CBC: Recent Labs  Lab 07/22/24 0350 07/22/24 1118 07/22/24 1552 07/23/24 0000 07/23/24 0406 07/24/24 0412 07/28/24 0600  WBC 6.1  --   --  13.0* 12.6* 19.0* 19.6*  NEUTROABS 3.7  --   --   --   --   --   --   HGB 11.2*   < > 9.9* 11.5* 11.5* 11.7* 9.1*  HCT 35.1*   < > 29.0* 34.8* 35.8* 36.2 27.5*  MCV 95.4  --   --  91.1 92.0 92.1 90.2  PLT 241  --   --  191 215 201 281   < > = values in this interval not displayed.    LFT Recent Labs  Lab 07/24/24 0412 07/25/24 0400 07/26/24 0413 07/27/24 0513 07/28/24 0600  AST 33 26 21 20 24   ALT 27 22 18 19 21   ALKPHOS 63 64 70 75 89  BILITOT 2.6* 1.9* 2.0* 1.4* 1.5*  PROT 5.4* 5.2* 5.7* 5.7* 5.7*  ALBUMIN  2.5* 2.0*  2.1* 1.9*  1.9* 1.6* 1.6*     Antibiotics: Anti-infectives (From admission, onward)    Start     Dose/Rate Route Frequency Ordered Stop   07/23/24 0000  ceFAZolin  (ANCEF ) IVPB 2g/100 mL premix        2 g 200 mL/hr over 30 Minutes Intravenous Every 8 hours 07/22/24 2314 07/23/24 0058   07/22/24 0900  ceFAZolin  (ANCEF ) IVPB 2g/100 mL premix        2 g 200 mL/hr over 30 Minutes Intravenous To ShortStay Surgical 07/21/24 1938 07/22/24 1432        DVT prophylaxis: Heparin   Code Status: Full code  Family Communication: Discussed with patient's family at bedside   CONSULTS General Surgery,  gastroenterology   Subjective   Denies any complaints.  NG tube placed yesterday due to vomiting.  Objective    Physical Examination:  Appears in no acute distress S1-S2, regular, no murmur auscultated Lungs clear to auscultation bilaterally Abdomen is soft, nontender, no organomegaly   Status is: Inpatient:             [name]   [facility] If 7PM-7AM, please contact night-coverage at [email or web address], Office  [phone]   07/28/2024, 7:53 AM  LOS: 24 days

## 2024-07-28 NOTE — Plan of Care (Signed)
  Problem: Clinical Measurements: Goal: Ability to maintain clinical measurements within normal limits will improve Outcome: Progressing Goal: Will remain free from infection Outcome: Progressing Goal: Diagnostic test results will improve Outcome: Progressing Goal: Respiratory complications will improve Outcome: Progressing Goal: Cardiovascular complication will be avoided Outcome: Progressing   Problem: Activity: Goal: Risk for activity intolerance will decrease Outcome: Progressing   Problem: Nutrition: Goal: Adequate nutrition will be maintained Outcome: Progressing   Problem: Coping: Goal: Level of anxiety will decrease Outcome: Progressing   Problem: Elimination: Goal: Will not experience complications related to bowel motility Outcome: Progressing   Problem: Pain Managment: Goal: General experience of comfort will improve and/or be controlled Outcome: Progressing   Problem: Safety: Goal: Ability to remain free from injury will improve Outcome: Progressing   Problem: Skin Integrity: Goal: Risk for impaired skin integrity will decrease Outcome: Progressing

## 2024-07-28 NOTE — Progress Notes (Signed)
 PHARMACY - TOTAL PARENTERAL NUTRITION CONSULT NOTE  Indication: Gastric outlet obstruction 2nd to mass   Patient Measurements: Height: 5' 8 (172.7 cm) Weight: 120.4 kg (265 lb 6.9 oz) IBW/kg (Calculated) : 63.9 TPN AdjBW (KG): 74.7 Body mass index is 40.36 kg/m.   Assessment:  Pharmacy consulted to manage TPN for nutrition support in [name] with gastric outlet obstruction 2nd mass.  PMH: HLD, prediabetes, CKDII does not take any medication on regular basis, past surgical h/o S/P total hysterectomy and BSO   Glucose / Insulin : hx preDM (A1c 6.1 in Aug 2025) CBG<100 s/p D50 x2 doses overnight  Decadron  5mg  given 9/9.  Required a significant amount of insulin  since Whipple procedure; previous TPNs contained up to 60 units of insulin  and CBGs were controlled. Used 0 units rSSI, Lantus  20 units SQ BID, 90 units insulin  in TPN Electrolytes: all WNL Renal: Scr 0.99, BUN 27 Hepatic: LFTs okay, TG 207, tbili 1.5 (no jaundice), albumin  1.6 I/O: UOP 0.7 ml/kg/hr, drain 300 mL, LBM 9/8, NGT output 500 mL GI Imaging:  8/22 CT: SBO from mass 8/25 AXR: duodenal stricture suspicious for mass & malignancy 8/28 AXR: Nonobstructive bowel gas pattern.  GI Surgeries / Procedures:  8/23 SB enteroscopy: esophagitis; invasive adenocarcinoma 9/9 Whipple procedure  Central access: PICC placed 07/08/24 TPN start date: 07/08/2024  Nutritional Goals:   TPN at 75 ml/hr provides 95g AA and 1717 kCal per day  RD Estimated Needs Total Energy Estimated Needs: 1700-1900 Total Protein Estimated Needs: 75-95 grams Total Fluid Estimated Needs: >/= 1.9 L   Current Nutrition:  TPN Osmolite 1.5 via J-tube @ 20 ml/hr , retrial trickle feeds today per MD  Plan:  Discontinue lantus  20 u BID (done by MD this AM) Stop TPN now, discussed w RN  Wenceslao D10 @60 /hr to cover long acting insulin  given 9/14 ~@2200   For Tonight's TPN: Continue TPN at 75 ml/hr to meet 100% of needs Electrolytes in TPN: Na 181mEq/L, K  38mEq/L, Ca 7 mEq/L, Mg 7 mEq/L, Phos 22 mmol/L, max acetate Continue standard MVI and trace elements to TPN Continue resistant SSI Q4H and decrease 45 units regular insulin  in TPN Monitor TPN labs on Mon/Thurs  Sharyne Glatter, PharmD, BCCCP Critical Care Clinical Pharmacist 07/28/2024 8:13 AM

## 2024-07-28 NOTE — Progress Notes (Addendum)
 Physical Therapy Treatment Patient Details Name: Lauren Gray MRN: [id number] DOB: 08/26/1951 Today's Date: 07/28/2024   History of Present Illness Pt is a 73 y/o female presenting with 1 month of nausea/vomiting. CT abdomen showed SBO from underlying mass. Mass biopsy showed + invasive adenocarcinoma. Requiring TPN. S/p pancreaticoduodenectomy, pancreatic duct stent placement, J-tube placement 9/9. PMH: hypokalemia, HTN, CKD, prediabetes    PT Comments  Pt in bed at start of session and agreeable to therapy. Pt progressing mobility during today's session as she requires less assistance for bed mobility to sit on L EOB. Pt not requiring as heavy of assist to maintain balance during ambulation utilizing a RW to go from her bed to recliner. Pt continues to be limited by the pain in her abdomen and is expected to progress mobility with increased pain management. Pt to benefit from further PT to progress mobility and activity tolerance. PT to follow acutely.    If plan is discharge home, recommend the following: A lot of help with walking and/or transfers;A little help with bathing/dressing/bathroom;Assist for transportation;Help with stairs or ramp for entrance   Can travel by private vehicle        Equipment Recommendations  Other (comment) (TBD)    Recommendations for Other Services       Precautions / Restrictions Precautions Precautions: Fall;Other (comment) Precaution/Restrictions Comments: JP drainx2, small wound vac Restrictions Weight Bearing Restrictions Per Provider Order: No     Mobility  Bed Mobility Overal bed mobility: Needs Assistance Bed Mobility: Supine to Sit     Supine to sit: +2 for physical assistance, HOB elevated, Min assist     General bed mobility comments: Cues for sequencing. Assit at trunk and LE towards L EOB    Transfers Overall transfer level: Needs assistance Equipment used: Rolling walker (2 wheels) Transfers: Sit to/from Stand Sit to Stand:  Mod assist, +2 physical assistance           General transfer comment: Assist for power up and steadying    Ambulation/Gait Ambulation/Gait assistance: Min assist, +2 physical assistance, +2 safety/equipment Gait Distance (Feet): 6 Feet Assistive device: Rolling walker (2 wheels) Gait Pattern/deviations: Step-through pattern, Decreased stride length, Shuffle, Trunk flexed Gait velocity: Decreased     General Gait Details: Cues for walker management and lines. Min A to steady   Comptroller Bed    Modified Rankin (Stroke Patients Only)       Balance Overall balance assessment: Needs assistance Sitting-balance support: Bilateral upper extremity supported, Feet supported Sitting balance-Leahy Scale: Fair     Standing balance support: During functional activity, Bilateral upper extremity supported Standing balance-Leahy Scale: Poor Standing balance comment: Limited by pain                            Communication Communication Communication: No apparent difficulties  Cognition Arousal: Alert Behavior During Therapy: WFL for tasks assessed/performed   PT - Cognitive impairments: No apparent impairments                         Following commands: Intact      Cueing Cueing Techniques: Verbal cues  Exercises General Exercises - Lower Extremity Long Arc Quad: AROM, Both, 10 reps, Seated    General Comments General comments (skin integrity, edema, etc.): VSS on 3LO2.  Pertinent Vitals/Pain Pain Assessment Pain Assessment: Faces Faces Pain Scale: Hurts little more Pain Location: Abdomen with activity Pain Descriptors / Indicators: Grimacing, Aching Pain Intervention(s): Limited activity within patient's tolerance, Monitored during session    Home Living                          Prior Function            PT Goals (current goals can now be found in the care plan section)  Acute Rehab PT Goals Patient Stated Goal: Return to independence PT Goal Formulation: With patient Time For Goal Achievement: 08/08/24 Potential to Achieve Goals: Good Progress towards PT goals: Progressing toward goals    Frequency    Min 2X/week      PT Plan      Co-evaluation              AM-PAC PT 6 Clicks Mobility   Outcome Measure  Help needed turning from your back to your side while in a flat bed without using bedrails?: A Lot Help needed moving from lying on your back to sitting on the side of a flat bed without using bedrails?: A Lot Help needed moving to and from a bed to a chair (including a wheelchair)?: A Lot Help needed standing up from a chair using your arms (e.g., wheelchair or bedside chair)?: A Lot Help needed to walk in hospital room?: A Lot Help needed climbing 3-5 steps with a railing? : Total 6 Click Score: 11    End of Session Equipment Utilized During Treatment: Oxygen Activity Tolerance: Patient tolerated treatment well Patient left: in chair;with call bell/phone within reach;with chair alarm set;with family/visitor present Nurse Communication: Mobility status PT Visit Diagnosis: Other abnormalities of gait and mobility (R26.89);Pain;Muscle weakness (generalized) (M62.81) Pain - part of body:  (Abdomen)     Time: 8769-8744 PT Time Calculation (min) (ACUTE ONLY): 25 min  Charges:    $Therapeutic Activity: 23-37 mins PT General Charges $$ ACUTE PT VISIT: 1 Visit                     Quintin Campi, SPT  Acute Rehab  [phone]    Quintin Campi 07/28/2024, 2:06 PM

## 2024-07-29 ENCOUNTER — Ambulatory Visit: Payer: Self-pay | Admitting: Nurse Practitioner

## 2024-07-29 DIAGNOSIS — N179 Acute kidney failure, unspecified: Secondary | ICD-10-CM | POA: Diagnosis not present

## 2024-07-29 DIAGNOSIS — K56609 Unspecified intestinal obstruction, unspecified as to partial versus complete obstruction: Secondary | ICD-10-CM | POA: Diagnosis not present

## 2024-07-29 DIAGNOSIS — K5900 Constipation, unspecified: Secondary | ICD-10-CM | POA: Diagnosis not present

## 2024-07-29 DIAGNOSIS — C170 Malignant neoplasm of duodenum: Secondary | ICD-10-CM | POA: Diagnosis not present

## 2024-07-29 LAB — GLUCOSE, CAPILLARY
Glucose-Capillary: 193 mg/dL — ABNORMAL HIGH (ref 70–99)
Glucose-Capillary: 204 mg/dL — ABNORMAL HIGH (ref 70–99)
Glucose-Capillary: 212 mg/dL — ABNORMAL HIGH (ref 70–99)
Glucose-Capillary: 215 mg/dL — ABNORMAL HIGH (ref 70–99)
Glucose-Capillary: 217 mg/dL — ABNORMAL HIGH (ref 70–99)
Glucose-Capillary: 234 mg/dL — ABNORMAL HIGH (ref 70–99)

## 2024-07-29 LAB — COMPREHENSIVE METABOLIC PANEL WITH GFR
ALT: 22 U/L (ref 0–44)
AST: 22 U/L (ref 15–41)
Albumin: <1.5 g/dL — ABNORMAL LOW (ref 3.5–5.0)
Alkaline Phosphatase: 103 U/L (ref 38–126)
Anion gap: 9 (ref 5–15)
BUN: 23 mg/dL (ref 8–23)
CO2: 25 mmol/L (ref 22–32)
Calcium: 7.7 mg/dL — ABNORMAL LOW (ref 8.9–10.3)
Chloride: 102 mmol/L (ref 98–111)
Creatinine, Ser: 1 mg/dL (ref 0.44–1.00)
GFR, Estimated: 59 mL/min — ABNORMAL LOW (ref >60)
Glucose, Bld: 211 mg/dL — ABNORMAL HIGH (ref 70–99)
Potassium: 4.2 mmol/L (ref 3.5–5.1)
Sodium: 136 mmol/L (ref 135–145)
Total Bilirubin: 1.5 mg/dL — ABNORMAL HIGH (ref 0.0–1.2)
Total Protein: 5.9 g/dL — ABNORMAL LOW (ref 6.5–8.1)

## 2024-07-29 MED ORDER — STERILE WATER FOR INJECTION IJ SOLN
INTRAMUSCULAR | Status: AC
  Administered 2024-07-29: 10 mL
  Filled 2024-07-29: qty 10

## 2024-07-29 MED ORDER — OSMOLITE 1.5 CAL PO LIQD
1000.0000 mL | ORAL | Status: DC
  Administered 2024-07-29: 1000 mL

## 2024-07-29 MED ORDER — PROSOURCE TF20 ENFIT COMPATIBL EN LIQD
60.0000 mL | Freq: Every day | ENTERAL | Status: DC
  Administered 2024-07-30 – 2024-08-07 (×9): 60 mL
  Filled 2024-07-29 (×9): qty 60

## 2024-07-29 MED ORDER — TRAVASOL 10 % IV SOLN
INTRAVENOUS | Status: AC
  Filled 2024-07-29: qty 954

## 2024-07-29 MED ORDER — INSULIN GLARGINE 100 UNIT/ML ~~LOC~~ SOLN: 10.0000 [IU] | Freq: Two times a day (BID) | SUBCUTANEOUS | Status: DC

## 2024-07-29 MED ORDER — OSMOLITE 1.5 CAL PO LIQD
1000.0000 mL | ORAL | Status: DC
Start: 2024-07-29 — End: 2024-07-30
  Administered 2024-07-29 – 2024-07-30 (×2): 1000 mL

## 2024-07-29 MED ORDER — INSULIN ASPART 100 UNIT/ML IJ SOLN
3.0000 [IU] | INTRAMUSCULAR | Status: DC
  Administered 2024-07-29 – 2024-07-30 (×6): 3 [IU] via SUBCUTANEOUS

## 2024-07-29 MED ORDER — OSMOLITE 1.5 CAL PO LIQD: 1000.0000 mL | ORAL | Status: DC

## 2024-07-29 MED ORDER — AMLODIPINE BESYLATE 5 MG PO TABS: 5.0000 mg | ORAL_TABLET | Freq: Every day | ORAL | Status: DC

## 2024-07-29 MED ORDER — ACETAMINOPHEN 10 MG/ML IV SOLN
1000.0000 mg | Freq: Four times a day (QID) | INTRAVENOUS | Status: AC
  Administered 2024-07-29 – 2024-07-30 (×4): 1000 mg via INTRAVENOUS
  Filled 2024-07-29 (×4): qty 100

## 2024-07-29 NOTE — Inpatient Diabetes Management (Signed)
 Inpatient Diabetes Program Recommendations  AACE/ADA: New Consensus Statement on Inpatient Glycemic Control (2015)  Target Ranges:  Prepandial:   less than 140 mg/dL      Peak postprandial:   less than 180 mg/dL (1-2 hours)      Critically ill patients:  140 - 180 mg/dL   Lab Results  Component Value Date   GLUCAP 193 (H) 07/29/2024   HGBA1C 6.1 (H) 06/18/2024    Review of Glycemic Control  Latest Reference Range & Units 07/28/24 15:36 07/28/24 20:11 07/29/24 00:15 07/29/24 04:28 07/29/24 07:13  Glucose-Capillary 70 - 99 mg/dL 848 (H) 818 (H) 765 (H) 212 (H) 193 (H)  (H): Data is abnormally high Diabetes history: Type 2 DM Outpatient Diabetes medications: none Current orders for Inpatient glycemic control: Novolog  0-20 units Q4H TPN Osmolite @ 50 ml/hr  Inpatient Diabetes Program Recommendations:    Insulin  totals in TPN decreasing.  Noted patient had hypoglycemic event following administration of Lantus  25 BID.  Addition of tube feeds at 50 ml/hr.  Secure chat sent to [facility] to confirm plan and recommendation.   Consider adding Lantus  3 units Q4H for tube feed coverage (to be stopped or held in the event tube feeds stopped).   Thanks, Tinnie Minus, MSN, RNC-OB Diabetes Coordinator [phone] (8a-5p)

## 2024-07-29 NOTE — Progress Notes (Signed)
 7 Days Post-Op   Subjective/Chief Complaint: Patient is very discouraged due to weakness.  Tolerated J tube feeds.  Having flatus.     Objective: Vital signs in last 24 hours: Temp:  [98.1 F (36.7 C)-99.3 F (37.4 C)] 98.4 F (36.9 C) (09/16 0800) Pulse Rate:  [83-120] 99 (09/16 1000) Resp:  [14-34] 22 (09/16 1000) BP: (91-197)/(41-85) 147/61 (09/16 1000) SpO2:  [88 %-95 %] 91 % (09/16 1000) Last BM Date : 07/28/24 (smear)  Intake/Output from previous day: 09/15 0701 - 09/16 0700 In: 3094.8 [I.V.:1720; NG/GT:411; IV Piggyback:963.7] Out: 1480 [Urine:875; Emesis/NG output:200; Drains:405] Intake/Output this shift: Total I/O In: 358.5 [I.V.:225; Other:30; NG/GT:68.2; IV Piggyback:35.3] Out: 590 [Urine:300; Emesis/NG output:200; Drains:90]  General: NGT with bile output Resp:  unlabored, normal rate CV:  RR&R Abd:  soft, non distended.  One JP looks like old blood, one looks murky serous.  J tube in place.   Ext:  SCDs in place  Lab Results:  Recent Labs    07/28/24 0600  WBC 19.6*  HGB 9.1*  HCT 27.5*  PLT 281    BMET Recent Labs    07/28/24 0600 07/29/24 0439  NA 139 136  K 4.3 4.2  CL 102 102  CO2 23 25  GLUCOSE 68* 211*  BUN 27* 23  CREATININE 0.99 1.00  CALCIUM  7.9* 7.7*   PT/INR No results for input(s): LABPROT, INR in the last 72 hours.  ABG No results for input(s): PHART, HCO3 in the last 72 hours.  Invalid input(s): PCO2, PO2   Studies/Results: DG CHEST PORT 1 VIEW Result Date: 07/27/2024 CLINICAL DATA:  Vomiting EXAM: PORTABLE CHEST 1 VIEW COMPARISON:  07/24/2024 FINDINGS: Right PICC line tip: SVC.  Nasogastric tube tip in the stomach body. Low lung volumes are present, causing crowding of the pulmonary vasculature. Bandlike density at the right lung base potentially from atelectasis or scarring. Central retrocardiac density compatible with small hiatal hernia. No blunting of the costophrenic angles. Heart size within normal  limits. No significant skeletal abnormality observed. IMPRESSION: 1. Low lung volumes are present, causing crowding of the pulmonary vasculature. 2. Bandlike density at the right lung base potentially from atelectasis or scarring. 3. Small hiatal hernia. 4. Nasogastric tube tip in the stomach body. Electronically Signed   By: Ryan Salvage M.D.   On: 07/27/2024 17:43   DG Abd 1 View Result Date: 07/27/2024 CLINICAL DATA:  Nasogastric tube placement EXAM: ABDOMEN - 1 VIEW COMPARISON:  07/10/2024 FINDINGS: Nasogastric tube tip and side port are in the stomach body. Midline skin staples in the upper abdomen. Contrast medium noted in the colon. IMPRESSION: 1. Nasogastric tube tip and side port are in the stomach body. Electronically Signed   By: Ryan Salvage M.D.   On: 07/27/2024 17:41     Anti-infectives: Anti-infectives (From admission, onward)    Start     Dose/Rate Route Frequency Ordered Stop   07/28/24 0945  piperacillin -tazobactam (ZOSYN ) IVPB 3.375 g        3.375 g 12.5 mL/hr over 240 Minutes Intravenous Every 8 hours 07/28/24 0853     07/23/24 0000  ceFAZolin  (ANCEF ) IVPB 2g/100 mL premix        2 g 200 mL/hr over 30 Minutes Intravenous Every 8 hours 07/22/24 2314 07/23/24 0058   07/22/24 0900  ceFAZolin  (ANCEF ) IVPB 2g/100 mL premix        2 g 200 mL/hr over 30 Minutes Intravenous To ShortStay Surgical 07/21/24 1938 07/22/24 1432  Assessment/Plan: s/p Procedure(s): WHIPPLE PROCEDURE (N/A) CREATION, JEJUNOSTOMY (N/A)- Kaysey Berndt POD 7- 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  pT3bN1 final path.    Prn oxy, robaxin , tylenol  T bili down to 1.5, stabilizing.  AKI, appears improved/resolved. ABL anemia. Recheck tomorrow.  Started zosyn  for pancreatic leak.   Defer management of blood sugar/HTN to medicine.  Pt had generous panc head and uncinate process so lost a good amount of functional pancreas.    Epidural d/c'd.    TPN  until patient tolerating J tube feeds. Advance tube feeds to goal. Continue tpn tonight.  If tolerates at goal overnight, consider tapering tomorrow.    Patient had GOO for a while, so would not expect her stomach to function normally for a while.  When we take out NGT again (maybe tomorrow) will just stick to sips/clears.         LOS: 25 days   Jina LITTIE Nephew, MD, FACS, FSSO Surgical Oncology, General Surgery, Trauma and Critical [facility] Surgery, [place] [phone] for weekday/non holidays Check [email or web address] for coverage night/weekend/holidays

## 2024-07-29 NOTE — Progress Notes (Signed)
 1200 per patient and her husband they want to allow patient to be pain free and rest at this time BP and  finally controlled patient didn't sleep through the night

## 2024-07-29 NOTE — Evaluation (Signed)
 Occupational Therapy Evaluation Patient Details Name: Lauren Gray MRN: [id number] DOB: 07-Aug-1951 Today's Date: 07/29/2024   History of Present Illness   Pt is a 73 y/o female presenting with 1 month of nausea/vomiting. CT abdomen showed SBO from underlying mass. Mass biopsy showed + invasive adenocarcinoma. Requiring TPN. S/p pancreaticoduodenectomy, pancreatic duct stent placement, J-tube placement 9/9. PMH: hypokalemia, HTN, CKD, prediabetes     Clinical Impressions Pt ind, working at baseline, lives with spouse. Pt currently needs up to mod A for ADLs, min A for bed mobility and min +2 for transfers with RW. VSS on RA, but pt feeling SOB and hot with stand pivot transfer to chair, 2L O2 placed back on for comfort. Pt fatigued after transfer to chair, BP elevated with RN present. Pt presenting with impairments listed below, will follow acutely. Patient will benefit from intensive inpatient follow-up therapy, >3 hours/day to maximize safety/ind with ADL/functional mobility.      If plan is discharge home, recommend the following:   A little help with walking and/or transfers;A lot of help with bathing/dressing/bathroom;Assistance with cooking/housework;Direct supervision/assist for medications management;Direct supervision/assist for financial management;Assist for transportation;Help with stairs or ramp for entrance     Functional Status Assessment   Patient has had a recent decline in their functional status and demonstrates the ability to make significant improvements in function in a reasonable and predictable amount of time.     Equipment Recommendations   BSC/3in1;Tub/shower seat     Recommendations for Other Services   PT consult;Rehab consult     Precautions/Restrictions   Precautions Precautions: Fall;Other (comment) Precaution/Restrictions Comments: JP drainx2, preveena, NGT Restrictions Weight Bearing Restrictions Per Provider Order: No     Mobility  Bed Mobility Overal bed mobility: Needs Assistance Bed Mobility: Supine to Sit     Supine to sit: Min assist          Transfers Overall transfer level: Needs assistance Equipment used: Rolling walker (2 wheels) Transfers: Sit to/from Stand, Bed to chair/wheelchair/BSC Sit to Stand: Min assist, +2 physical assistance     Step pivot transfers: Min assist, +2 safety/equipment            Balance Overall balance assessment: Needs assistance Sitting-balance support: Bilateral upper extremity supported, Feet supported Sitting balance-Leahy Scale: Fair Sitting balance - Comments: Limited by pain   Standing balance support: During functional activity, Bilateral upper extremity supported Standing balance-Leahy Scale: Poor Standing balance comment: Limited by pain                           ADL either performed or assessed with clinical judgement   ADL Overall ADL's : Needs assistance/impaired     Grooming: Set up;Sitting   Upper Body Bathing: Moderate assistance   Lower Body Bathing: Moderate assistance   Upper Body Dressing : Moderate assistance   Lower Body Dressing: Moderate assistance   Toilet Transfer: Minimal assistance;+2 for safety/equipment   Toileting- Clothing Manipulation and Hygiene: Moderate assistance       Functional mobility during ADLs: Minimal assistance;+2 for physical assistance;Rolling walker (2 wheels)       Vision Patient Visual Report: No change from baseline Vision Assessment?: No apparent visual deficits     Perception Perception: Not tested       Praxis Praxis: Not tested       Pertinent Vitals/Pain Pain Assessment Pain Assessment: Faces Pain Score: 4  Faces Pain Scale: Hurts little more Pain Location: Abdomen with activity Pain  Descriptors / Indicators: Grimacing, Aching Pain Intervention(s): Limited activity within patient's tolerance, Monitored during session, Repositioned     Extremity/Trunk Assessment  Upper Extremity Assessment Upper Extremity Assessment: Generalized weakness   Lower Extremity Assessment Lower Extremity Assessment: Generalized weakness   Cervical / Trunk Assessment Cervical / Trunk Assessment: Normal   Communication Communication Communication: No apparent difficulties   Cognition Arousal: Alert Behavior During Therapy: Flat affect Cognition: No apparent impairments             OT - Cognition Comments: appears globally fatigued                 Following commands: Intact       Cueing  General Comments   Cueing Techniques: Verbal cues  VSS on 2L O2, pt feeling hot after transfer   Exercises     Shoulder Instructions      Home Living Family/patient expects to be discharged to:: Private residence Living Arrangements: Spouse/significant other Available Help at Discharge: Family Type of Home: House Home Access: Level entry     Home Layout: Multi-level Alternate Level Stairs-Number of Steps: 5 + 6 to get to main level Alternate Level Stairs-Rails: Can reach both Bathroom Shower/Tub: Chief Strategy Officer: Handicapped height Bathroom Accessibility: Yes   Home Equipment: Agricultural consultant (2 wheels);Cane - single point;Hand held shower head;Grab bars - tub/shower          Prior Functioning/Environment Prior Level of Function : Independent/Modified Independent;Working/employed;Driving             Mobility Comments: no AD ADLs Comments: Indep with ADLs, IADLs, works in teaching    OT Problem List: Decreased strength;Decreased activity tolerance;Impaired balance (sitting and/or standing);Decreased knowledge of use of DME or AE;Pain;Decreased range of motion;Decreased safety awareness;Cardiopulmonary status limiting activity   OT Treatment/Interventions: Self-care/ADL training;Therapeutic exercise;Energy conservation;DME and/or AE instruction;Therapeutic activities;Patient/family education;Balance training      OT  Goals(Current goals can be found in the care plan section)   Acute Rehab OT Goals Patient Stated Goal: to rest OT Goal Formulation: With patient/family Time For Goal Achievement: 08/12/24 Potential to Achieve Goals: Good ADL Goals Pt Will Perform Grooming: with set-up;standing;sitting Pt Will Perform Upper Body Dressing: with set-up;sitting;standing Pt Will Perform Lower Body Dressing: with set-up;sitting/lateral leans;sit to/from stand Pt Will Transfer to Toilet: with set-up;ambulating;regular height toilet   OT Frequency:  Min 2X/week    Co-evaluation              AM-PAC OT 6 Clicks Daily Activity     Outcome Measure Help from another person eating meals?: A Little Help from another person taking care of personal grooming?: A Little Help from another person toileting, which includes using toliet, bedpan, or urinal?: A Lot Help from another person bathing (including washing, rinsing, drying)?: A Lot Help from another person to put on and taking off regular upper body clothing?: A Lot Help from another person to put on and taking off regular lower body clothing?: A Lot 6 Click Score: 14   End of Session Equipment Utilized During Treatment: Rolling walker (2 wheels);Oxygen Nurse Communication: Mobility status  Activity Tolerance: Patient limited by fatigue Patient left: in chair;with call bell/phone within reach;with nursing/sitter in room;with family/visitor present  OT Visit Diagnosis: Unsteadiness on feet (R26.81);Other abnormalities of gait and mobility (R26.89);Muscle weakness (generalized) (M62.81)                Time: 8650-8581 OT Time Calculation (min): 29 min Charges:  OT General Charges $OT Visit:  1 Visit OT Evaluation $OT Eval Moderate Complexity: 1 Mod OT Treatments $Therapeutic Activity: 8-22 mins  Tsion Inghram K, OTD, OTR/L SecureChat Preferred Acute Rehab [phone]   Laneta MARLA Pereyra 07/29/2024, 4:45 PM

## 2024-07-29 NOTE — Plan of Care (Signed)

## 2024-07-29 NOTE — Progress Notes (Signed)
 PHARMACY - TOTAL PARENTERAL NUTRITION CONSULT NOTE  Indication: Gastric outlet obstruction 2nd to mass   Patient Measurements: Height: 5' 8 (172.7 cm) Weight: 120.4 kg (265 lb 6.9 oz) IBW/kg (Calculated) : 63.9 TPN AdjBW (KG): 74.7 Body mass index is 40.36 kg/m.   Assessment:  Pharmacy consulted to manage TPN for nutrition support in [name] with gastric outlet obstruction 2nd mass.  PMH: HLD, prediabetes, CKDII does not take any medication on regular basis, past surgical h/o S/P total hysterectomy and BSO   Glucose / Insulin : hx preDM (A1c 6.1 in Aug 2025) CBG<100 s/p D50 x2 doses >>D10 @60  while TPN down  -Required a significant amount of insulin  since Whipple procedure; previous TPNs contained up to 60 units of insulin  and CBGs were controlled. Used 22 units rSSI since new bag was hung + 45u insulin  in TPN  Electrolytes: all WNL Renal: Scr 1.0, BUN 23 Hepatic: LFTs okay, TG 207, tbili 1.5 (no jaundice), albumin  <1.5 I/O: UOP 0.3 ml/kg/hr, drain 405 mL, LBM 9/8, NGT output 200 mL GI Imaging:  8/22 CT: SBO from mass 8/25 AXR: duodenal stricture suspicious for mass & malignancy 8/28 AXR: Nonobstructive bowel gas pattern.  GI Surgeries / Procedures:  8/23 SB enteroscopy: esophagitis; invasive adenocarcinoma 9/9 Whipple procedure  Central access: PICC placed 07/08/24 TPN start date: 07/08/2024  Nutritional Goals:   TPN at 75 ml/hr provides 95g AA and 1717 kCal per day  RD Estimated Needs Total Energy Estimated Needs: 1700-1900 Total Protein Estimated Needs: 75-95 grams Total Fluid Estimated Needs: >/= 1.9 L   Current Nutrition:  TPN Osmolite 1.5 via J-tube @ 20 ml/hr , advance tube feeds to goal today per surgery -TPN stopped 9/15 AM d/t hypoglycemia. D10 @60  given as replacement fluid while TPN was off. TPN with new bag with reduced insulin -- restarted on 9/15 @1800    Plan:  Continue TPN at 75 ml/hr to meet 100% of needs Electrolytes in TPN: Na 141mEq/L, K 66mEq/L,  Ca 7 mEq/L, Mg 7 mEq/L, Phos 22 mmol/L, max acetate Continue standard MVI and trace elements to TPN Continue resistant SSI Q4H and increase to 65 units regular insulin  in TPN + 3u q4h for tube feeding coverage Monitor TPN labs on Mon/Thurs If tolerates TF at goal, consider wean TPN tomorrow per team.  Sharyne Glatter, PharmD, BCCCP Critical Care Clinical Pharmacist 07/29/2024 8:34 AM

## 2024-07-29 NOTE — Progress Notes (Signed)
  Inpatient Rehab Admissions Coordinator :  Per therapy recommendations, patient was screened for CIR candidacy by Ottie Glazier RN MSN.  At this time patient appears to be a potential candidate for CIR. I will place a rehab consult per protocol for full assessment. Please call me with any questions.  Ottie Glazier RN MSN Admissions Coordinator [phone]

## 2024-07-29 NOTE — Progress Notes (Addendum)
       Overnight   NAME: AMANDY CHUBBUCK MRN: [id number] DOB : 23-Dec-1950    Date of Service   07/29/2024   HPI/Events of Note    Notified by RN for continued hypertension.  Brief history, 73 year old female added to [facility] service secondary to gastric outlet obstruction with findings of underlying duodenal cancer. History of obesity, scoliosis. Whipple procedure on 07/22/2024.  Currently Patient has had continued elevated blood pressure, with original orders of Norvasc  5 mg, scheduled IV metoprolol  5 mg every 6 hours now and hydralazine  as needed also available. Norvasc  has since been discontinued on rounds.  Notation from physician shows amlodipine   per G-tube daily previously.  Currently, blood pressure which is consistently greater than 170 and fluctuates to as high as 195 SBP. Recommending current administration of morphine  IV as BP seems to correspond with previous administrations of pain medication on flowsheet.   Interventions/ Plan   Morphine  administration Continue all attending and specialists previous orders Will recheck BP after administration of morphine  and onset time.       Update 0234 hours Current blood pressure after administration of morphine  he is at 145/65 (90) It appears patient may be masking pain.  Observe for pain scale and reinforce patient has availability of pain medication. Continue all other antihypertensives as previously ordered.  Lynwood Kipper BSN MSNA MSN ACNPC-AG Acute Care Nurse Practitioner [facility]

## 2024-07-29 NOTE — Progress Notes (Signed)
 Nutrition Follow-up  DOCUMENTATION CODES:   Obesity unspecified  INTERVENTION:   Continue TPN per pharmacy- provides 1717kcal and 95g/day protein   Osmolite 1.5@65ml /hr- begin increasing by 10ml/hr q 8 hours until goal rate is reached.   ProSource TF 20- Give 60ml daily via tube, each supplement provides 80kcal and 20g of protein.   Free water  flushes 30ml q4 hours to maintain tube patency   Regimen provides 2420kcal/day, 118g/day protein and 1379ml/day of free water .   Daily weights   NUTRITION DIAGNOSIS:   Inadequate oral intake related to nausea, vomiting (GOO r/t duodenal mass) as evidenced by per patient/family report. -ongoing   GOAL:   Patient will meet greater than or equal to 90% of their needs -met   MONITOR:   Diet advancement, Labs, Weight trends, TF tolerance, Skin, I & O's, Other (Comment) (TPN)  ASSESSMENT:   73 y/o female with h/o CKD II, hiatal hernia, HLD, pre-diabetes, s/p hysterectomy and pelvic mass s/p laparoscopic bilateral salpingo-oophorectomy (2007) who is admitted with AKI and GOO secondary to duodenal mass (adenocarcinoma) now s/p classic pancreaticoduodenectomy, placement of pancreatic duct stent and placement of (18 [place]) feeding jejunostomy tube 9/9.  RD working remotely.  -Pt s/p TPN initiation 8/26 -trickle tube feeds started 9/10 -Pt noted to have leaking around J-tube and large volume emesis and tube feeds held, NGT placed -trickle tube feeds restarted 9/15 -plan is to advance towards goal rate today  Pt tolerating TPN at goal rate. Refeed labs stable. Pt tolerating tube feeds at 49ml/hr; plan is to begin increasing towards goal rate today. NGT in place to LIS with output. Plan is to begin weaning TPN tomorrow if pt is tolerating tube feeds. No bowel movements yet. Per chart, pt is up ~23lbs since admission. Pt + 21.6L on her I & Os.   Medications reviewed and include: heparin , insulin , protonix , zosyn , TPN  Labs reviewed: K  4.2 wnl, tbili 1.5(H) P 4.3 wnl, Mg 2.3 wnl- 9/15 Triglycerides- 207- 9/15 WBC- 19.6(H), Hgb 9.1(L), Hct 27.5(L) Cbc- 193, 212, 234 x 24 hrs   UOP-   Drains-   Diet Order:   Diet Order     None      EDUCATION NEEDS:   Education needs have been addressed  Skin:  Skin Assessment: Reviewed RN Assessment (incision abdomen, JP drain, VAC)  Last BM:  9/15- smear noted  Height:   Ht Readings from Last 1 Encounters:  07/22/24 5' 8 (1.727 m)    Weight:   Wt Readings from Last 1 Encounters:  07/27/24 120.4 kg   BMI:  Body mass index is 40.36 kg/m.  Estimated Nutritional Needs:   Kcal:  2200-2500kcal/day  Protein:  110-125g/day  Fluid:  1.9-2.2L/day  Augustin Shams MS, RD, LDN If unable to be reached, please send secure chat to RD inpatient available from 8:00a-4:00p daily

## 2024-07-29 NOTE — Progress Notes (Addendum)
 [facility]  PROGRESS NOTE  Lauren Gray FMW:[id number] DOB: March 06, 1951 DOA: 07/04/2024 PCP: Juanice Thomes SAUNDERS, FNP   Brief HPI:   73 yo female presented 18 days ago on [facility] service 2/2 gastric outlet obstruction with findings of underlying duodenal cancer. On original presentation, pt complained of lack of PO intake + N/V that has been constant. Decreased BM but + flatus and also denied abdominal pain. Pt did endorse weightloss of ~10 pounds since June. Denies known sick contacts, no f/c. No sob/cp /dizziness. Denies etoh use or tobacco use. Pt underwent whipple procedure today.  The patient underwent a Whipple procedure on 07/22/2024. The patient was seen in the PACU following the procedure. She has tolerated the procedure well    Assessment/Plan:   Duodenal adenocarcinoma with gastric outlet obstruction CT scan showed dilated 1st and 2nd portion of duodenum with possible underlying mass. Patient was seen by gastroenterology and underwent small bowel enteroscopy.  The duodenal mass was biopsied.  Pathology was positive for adenocarcinoma. General surgery was consulted .  Patient has poor nutritional status.  Due to gastric outlet obstruction from the duodenal mass patient   started on TPN.   Due to persistent nausea and vomiting NG tube was inserted.   - Patient was on postoperative epidural with ropivacaine , now discontinued Chloraseptic spray and viscous lidocaine  can be used for mouth/throat discomfort.   Surgery (Whipple procedure) was performed on 07/22/2024.  - Dilaudid  PCA discontinued, patient family did not want IV Dilaudid ; want to avoid opioids as it caused confusion  Nutrition -Unable to tolerate tube feeds; developed vomiting on 07/27/2024 -Tube feed discontinued, NG tube reinserted -Tube feeding has been restarted through jejunostomy tube -General Surgery following   Hypokalemia/hypophosphatemia Resolved. Nutrition to be provided by TPN as per surgery.   Essential  hypertension Blood pressure is elevated, continue scheduled IV metoprolol  5 mg every 6 hours -Start amlodipine  5 mg daily through J-tube -Continue hydralazine  as needed    Situational anxiety Ativan  as needed.   Hyperglycemia -Currently on sliding scale insulin  with NovoLog , resistant sliding scale - Due to elevated blood sugar, Lantus  dose will be changed to 25 units subcu twice daily - As tube feeds restarted.  Will start Lantus  back 10 units subcu twice daily   AKI on CKDII  Resolved    Mild elevated lipase Resolved   Medications     sodium chloride    Intravenous Once   Chlorhexidine  Gluconate Cloth  6 each Topical Q0600   feeding supplement (OSMOLITE 1.5 CAL)  1,000 mL Per Tube Q24H   fluticasone   2 spray Each Nare Daily   gabapentin   100 mg Per Tube Q8H   heparin  injection (subcutaneous)  5,000 Units Subcutaneous Q8H   insulin  aspart  0-20 Units Subcutaneous Q4H   methocarbamol  (ROBAXIN ) injection  500 mg Intravenous Q8H   metoprolol  tartrate  5 mg Intravenous Q6H   pantoprazole  (PROTONIX ) IV  40 mg Intravenous Q12H     Data Reviewed:   CBG:  Recent Labs  Lab 07/28/24 1536 07/28/24 2011 07/29/24 0015 07/29/24 0428 07/29/24 0713  GLUCAP 151* 181* 234* 212* 193*    SpO2: (!) 88 % O2 Flow Rate (L/min): 3 L/min FiO2 (%): 30 %    Vitals:   07/29/24 0803 07/29/24 0815 07/29/24 0830 07/29/24 0846  BP: (!) 166/64 (!) 172/61 (!) 172/56 (!) 178/60  Pulse: 100 (!) 103 (!) 108 (!) 103  Resp: (!) 28 (!) 30 14 (!) 32  Temp:  TempSrc:      SpO2: 95% 92% (!) 89% (!) 88%  Weight:      Height:          Data Reviewed:  Basic Metabolic Panel: Recent Labs  Lab 07/23/24 0406 07/23/24 1232 07/24/24 0412 07/25/24 0400 07/26/24 0413 07/27/24 0513 07/28/24 0600 07/29/24 0439  NA 136   < > 134* 136  137 136  136 135 139 136  K 5.1   < > 4.9 4.2  4.2 4.5  4.4 4.5 4.3 4.2  CL 107   < > 103 105  107 104  105 104 102 102  CO2 20*   < > 22 21*   21* 20*  21* 22 23 25   GLUCOSE 361*   < > 344* 198*  195* 193*  196* 119* 68* 211*  BUN 29*   < > 40* 32*  31* 30*  30* 35* 27* 23  CREATININE 1.18*   < > 1.38* 1.06*  1.11* 1.10*  0.95 1.21* 0.99 1.00  CALCIUM  7.9*   < > 8.1* 7.8*  7.8* 7.9*  7.9* 7.8* 7.9* 7.7*  MG 1.9  --  2.3  --  2.2  --  2.3  --   PHOS 3.3  --  2.4* 2.7 3.2  --  4.3  --    < > = values in this interval not displayed.    CBC: Recent Labs  Lab 07/22/24 1552 07/23/24 0000 07/23/24 0406 07/24/24 0412 07/28/24 0600  WBC  --  13.0* 12.6* 19.0* 19.6*  HGB 9.9* 11.5* 11.5* 11.7* 9.1*  HCT 29.0* 34.8* 35.8* 36.2 27.5*  MCV  --  91.1 92.0 92.1 90.2  PLT  --  191 215 201 281    LFT Recent Labs  Lab 07/25/24 0400 07/26/24 0413 07/27/24 0513 07/28/24 0600 07/29/24 0439  AST 26 21 20 24 22   ALT 22 18 19 21 22   ALKPHOS 64 70 75 89 103  BILITOT 1.9* 2.0* 1.4* 1.5* 1.5*  PROT 5.2* 5.7* 5.7* 5.7* 5.9*  ALBUMIN  2.0*  2.1* 1.9*  1.9* 1.6* 1.6* <1.5*     Antibiotics: Anti-infectives (From admission, onward)    Start     Dose/Rate Route Frequency Ordered Stop   07/28/24 0945  piperacillin -tazobactam (ZOSYN ) IVPB 3.375 g        3.375 g 12.5 mL/hr over 240 Minutes Intravenous Every 8 hours 07/28/24 0853     07/23/24 0000  ceFAZolin  (ANCEF ) IVPB 2g/100 mL premix        2 g 200 mL/hr over 30 Minutes Intravenous Every 8 hours 07/22/24 2314 07/23/24 0058   07/22/24 0900  ceFAZolin  (ANCEF ) IVPB 2g/100 mL premix        2 g 200 mL/hr over 30 Minutes Intravenous To ShortStay Surgical 07/21/24 1938 07/22/24 1432        DVT prophylaxis: Heparin   Code Status: Full code  Family Communication: Discussed with patient's family at bedside   CONSULTS General Surgery, gastroenterology   Subjective   Patient seen and examined, pain well-controlled.  NG tube in place.  Getting feeding through jejunostomy tube.  Also on TPN  Objective    Physical Examination:  Appears in no acute distress, NG tube in  place with wall suction S1-S2, regular Abdomen is soft, jejunostomy tube in place Extremities no edema   Status is: Inpatient:             [name]   [facility] If 7PM-7AM, please contact night-coverage at  [email or web address], Office  [phone]   07/29/2024, 9:05 AM  LOS: 25 days

## 2024-07-29 NOTE — Plan of Care (Signed)

## 2024-07-30 ENCOUNTER — Inpatient Hospital Stay (HOSPITAL_COMMUNITY)

## 2024-07-30 DIAGNOSIS — K56609 Unspecified intestinal obstruction, unspecified as to partial versus complete obstruction: Secondary | ICD-10-CM | POA: Diagnosis not present

## 2024-07-30 LAB — COMPREHENSIVE METABOLIC PANEL WITH GFR
ALT: 25 U/L (ref 0–44)
AST: 23 U/L (ref 15–41)
Albumin: <1.5 g/dL — ABNORMAL LOW (ref 3.5–5.0)
Alkaline Phosphatase: 117 U/L (ref 38–126)
Anion gap: 14 (ref 5–15)
BUN: 24 mg/dL — ABNORMAL HIGH (ref 8–23)
CO2: 24 mmol/L (ref 22–32)
Calcium: 7.9 mg/dL — ABNORMAL LOW (ref 8.9–10.3)
Chloride: 101 mmol/L (ref 98–111)
Creatinine, Ser: 0.92 mg/dL (ref 0.44–1.00)
GFR, Estimated: >60 mL/min (ref >60)
Glucose, Bld: 172 mg/dL — ABNORMAL HIGH (ref 70–99)
Potassium: 3.9 mmol/L (ref 3.5–5.1)
Sodium: 139 mmol/L (ref 135–145)
Total Bilirubin: 1.2 mg/dL (ref 0.0–1.2)
Total Protein: 5.9 g/dL — ABNORMAL LOW (ref 6.5–8.1)

## 2024-07-30 LAB — RENAL FUNCTION PANEL
Albumin: <1.5 g/dL — ABNORMAL LOW (ref 3.5–5.0)
Anion gap: 14 (ref 5–15)
BUN: 25 mg/dL — ABNORMAL HIGH (ref 8–23)
CO2: 24 mmol/L (ref 22–32)
Calcium: 7.8 mg/dL — ABNORMAL LOW (ref 8.9–10.3)
Chloride: 100 mmol/L (ref 98–111)
Creatinine, Ser: 0.91 mg/dL (ref 0.44–1.00)
GFR, Estimated: >60 mL/min (ref >60)
Glucose, Bld: 169 mg/dL — ABNORMAL HIGH (ref 70–99)
Phosphorus: 2.9 mg/dL (ref 2.5–4.6)
Potassium: 3.8 mmol/L (ref 3.5–5.1)
Sodium: 138 mmol/L (ref 135–145)

## 2024-07-30 LAB — CBC
HCT: 26 % — ABNORMAL LOW (ref 36.0–46.0)
Hemoglobin: 8.6 g/dL — ABNORMAL LOW (ref 12.0–15.0)
MCH: 29.8 pg (ref 26.0–34.0)
MCHC: 33.1 g/dL (ref 30.0–36.0)
MCV: 90 fL (ref 80.0–100.0)
Platelets: 410 K/uL — ABNORMAL HIGH (ref 150–400)
RBC: 2.89 MIL/uL — ABNORMAL LOW (ref 3.87–5.11)
RDW: 15.6 % — ABNORMAL HIGH (ref 11.5–15.5)
WBC: 24.6 K/uL — ABNORMAL HIGH (ref 4.0–10.5)
nRBC: 0.5 % — ABNORMAL HIGH (ref 0.0–0.2)

## 2024-07-30 LAB — GLUCOSE, CAPILLARY
Glucose-Capillary: 160 mg/dL — ABNORMAL HIGH (ref 70–99)
Glucose-Capillary: 180 mg/dL — ABNORMAL HIGH (ref 70–99)
Glucose-Capillary: 194 mg/dL — ABNORMAL HIGH (ref 70–99)
Glucose-Capillary: 199 mg/dL — ABNORMAL HIGH (ref 70–99)
Glucose-Capillary: 204 mg/dL — ABNORMAL HIGH (ref 70–99)
Glucose-Capillary: 230 mg/dL — ABNORMAL HIGH (ref 70–99)
Glucose-Capillary: 248 mg/dL — ABNORMAL HIGH (ref 70–99)

## 2024-07-30 LAB — MAGNESIUM: Magnesium: 2.2 mg/dL (ref 1.7–2.4)

## 2024-07-30 MED ORDER — DIAZEPAM 5 MG/ML IJ SOLN
5.0000 mg | INTRAMUSCULAR | Status: AC | PRN
  Administered 2024-07-30: 5 mg via INTRAVENOUS
  Filled 2024-07-30: qty 2

## 2024-07-30 MED ORDER — TRAZODONE HCL 50 MG PO TABS
25.0000 mg | ORAL_TABLET | Freq: Once | ORAL | Status: AC
Start: 2024-07-30 — End: 2024-07-30
  Administered 2024-07-30: 25 mg
  Filled 2024-07-30: qty 1

## 2024-07-30 MED ORDER — IOHEXOL 350 MG/ML SOLN
75.0000 mL | Freq: Once | INTRAVENOUS | Status: AC | PRN
  Administered 2024-07-30: 75 mL via INTRAVENOUS

## 2024-07-30 MED ORDER — VANCOMYCIN HCL 2000 MG/400ML IV SOLN
2000.0000 mg | Freq: Once | INTRAVENOUS | Status: AC
  Administered 2024-07-30: 2000 mg via INTRAVENOUS
  Filled 2024-07-30: qty 400

## 2024-07-30 MED ORDER — FLUCONAZOLE IN SODIUM CHLORIDE 400-0.9 MG/200ML-% IV SOLN
400.0000 mg | INTRAVENOUS | Status: AC
  Administered 2024-07-30 – 2024-08-09 (×11): 400 mg via INTRAVENOUS
  Filled 2024-07-30 (×11): qty 200

## 2024-07-30 MED ORDER — VANCOMYCIN HCL IN DEXTROSE 1-5 GM/200ML-% IV SOLN
1000.0000 mg | Freq: Two times a day (BID) | INTRAVENOUS | Status: DC
  Administered 2024-07-31 (×3): 1000 mg via INTRAVENOUS
  Filled 2024-07-30 (×4): qty 200

## 2024-07-30 MED ORDER — LABETALOL HCL 5 MG/ML IV SOLN
5.0000 mg | INTRAVENOUS | Status: DC | PRN
  Administered 2024-07-30 – 2024-07-31 (×3): 5 mg via INTRAVENOUS
  Filled 2024-07-30 (×3): qty 4

## 2024-07-30 MED ORDER — TRAZODONE HCL 50 MG PO TABS
50.0000 mg | ORAL_TABLET | Freq: Every day | ORAL | Status: DC
  Administered 2024-07-30: 50 mg
  Filled 2024-07-30: qty 1

## 2024-07-30 MED ORDER — PIPERACILLIN-TAZOBACTAM 3.375 G IVPB
3.3750 g | Freq: Three times a day (TID) | INTRAVENOUS | Status: DC
  Administered 2024-07-30 – 2024-08-01 (×7): 3.375 g via INTRAVENOUS
  Filled 2024-07-30 (×8): qty 50

## 2024-07-30 MED ORDER — TRAVASOL 10 % IV SOLN
INTRAVENOUS | Status: AC
  Filled 2024-07-30: qty 508.8

## 2024-07-30 MED ORDER — OSMOLITE 1.5 CAL PO LIQD
1000.0000 mL | ORAL | Status: DC
  Administered 2024-07-30 – 2024-08-01 (×3): 1000 mL

## 2024-07-30 MED ORDER — FUROSEMIDE 10 MG/ML IJ SOLN
20.0000 mg | Freq: Once | INTRAMUSCULAR | Status: AC
  Administered 2024-07-30: 20 mg via INTRAVENOUS
  Filled 2024-07-30: qty 2

## 2024-07-30 MED ORDER — STERILE WATER FOR INJECTION IJ SOLN
INTRAMUSCULAR | Status: AC
  Administered 2024-07-30: 10 mL
  Filled 2024-07-30: qty 10

## 2024-07-30 NOTE — TOC Progression Note (Signed)
 Transition of Care [facility]) - Progression Note    Patient Details  Name: Lauren Gray MRN: [id number] Date of Birth: [birth year]  [facility]) CM/SW Contact  Inocente GORMAN Kindle, LCSW Phone Number: 07/30/2024, 4:26 PM  Clinical Narrative:    Inpatient Care Management Continuing to follow for discharge needs.    Expected Discharge Plan: Home Health/CIR Barriers to Discharge: Continued Medical Work up               Expected Discharge Plan and Services       Living arrangements for the past 2 months: Single Family Home                                       Social Drivers of Health (SDOH) Interventions SDOH Screenings   Food Insecurity: No Food Insecurity (07/20/2024)  Housing: Unknown (07/20/2024)  Transportation Needs: No Transportation Needs (07/20/2024)  Utilities: Not At Risk (07/20/2024)  Depression (PHQ2-9): Low Risk  (06/18/2024)  Social Connections: Socially Integrated (07/20/2024)  Tobacco Use: Low Risk  (07/22/2024)    Readmission Risk Interventions    07/07/2024   12:30 PM  Readmission Risk Prevention Plan  Post Dischage Appt Complete  Medication Screening Complete  Transportation Screening Complete

## 2024-07-30 NOTE — Progress Notes (Signed)
 PHARMACY - TOTAL PARENTERAL NUTRITION CONSULT NOTE  Indication: Gastric outlet obstruction 2nd to mass   Patient Measurements: Height: 5' 8 (172.7 cm) Weight: 122 kg (268 lb 15.4 oz) IBW/kg (Calculated) : 63.9 TPN AdjBW (KG): 74.7 Body mass index is 40.9 kg/m.   Assessment:  Pharmacy consulted to manage TPN for nutrition support in [name] with gastric outlet obstruction 2nd mass.  PMH: HLD, prediabetes, CKDII does not take any medication on regular basis, past surgical h/o S/P total hysterectomy and BSO   Glucose / Insulin : hx preDM (A1c 6.1 in Aug 2025) -Required a significant amount of insulin  since Whipple procedure; previous TPNs contained up to 60 units of insulin  and CBGs were controlled. Used 34 units rSSI since new bag was hung + 65u insulin  in TPN  Electrolytes: all WNL Renal: Scr 0.92, BUN 24 Hepatic: LFTs okay, TG 207, tbili 1.2 (no jaundice), albumin  <1.5 I/O: UOP 0.6 ml/kg/hr, drain 105 mL, LBM 9/8, NGT output 250 mL GI Imaging:  8/22 CT: SBO from mass 8/25 AXR: duodenal stricture suspicious for mass & malignancy 8/28 AXR: Nonobstructive bowel gas pattern.  GI Surgeries / Procedures:  8/23 SB enteroscopy: esophagitis; invasive adenocarcinoma 9/9 Whipple procedure  Central access: PICC placed 07/08/24 TPN start date: 07/08/2024  Nutritional Goals:   TPN at 75 ml/hr provides 95g AA and 1717 kCal per day  RD Estimated Needs Total Energy Estimated Needs: 1700-1900 Total Protein Estimated Needs: 75-95 grams Total Fluid Estimated Needs: >/= 1.9 L   Current Nutrition:  TPN Osmolite 1.5 via J-tube @ goal- 65 ml/hr  Plan:  Decrease TPN to half rate 40 ml/hr to meet 53% of needs per surgery  Electrolytes in TPN: Na 199mEq/L, K 20mEq/L, Ca 10 mEq/L, Mg 10 mEq/L, Phos 30 mmol/L, max acetate adjusted for rate  Continue standard MVI and trace elements to TPN Continue resistant SSI Q4H and reduce to 35 units regular insulin  in TPN  Continue 3u q4h for tube feeding  coverage Monitor TPN labs on Mon/Thurs  Lauren Gray, PharmD CCM Pharmacy Resident  07/30/2024 10:29 AM

## 2024-07-30 NOTE — Progress Notes (Addendum)
 8 Days Post-Op   Subjective/Chief Complaint: Patient white count is up, looks a bit worse today.  Having flushing.     Objective: Vital signs in last 24 hours: Temp:  [97.4 F (36.3 C)-98.8 F (37.1 C)] 97.7 F (36.5 C) (09/17 0800) Pulse Rate:  [87-115] 101 (09/17 1023) Resp:  [15-38] 29 (09/17 1023) BP: (132-184)/(31-123) 156/51 (09/17 1023) SpO2:  [90 %-96 %] 90 % (09/17 1023) Weight:  [877 kg] 122 kg (09/17 0500) Last BM Date : 07/28/24 (smear)  Intake/Output from previous day: 09/16 0701 - 09/17 0700 In: 3229.4 [I.V.:1800.8; NG/GT:952.3; IV Piggyback:446.3] Out: 2620 [Urine:1850; Emesis/NG output:250; Drains:520] Intake/Output this shift: Total I/O In: 550.9 [I.V.:224.2; NG/GT:193.5; IV Piggyback:133.2] Out: 380 [Urine:225; Emesis/NG output:50; Drains:105]  General: NGT with bile output Resp:  tachypneic CV:  sl tachycardic Abd:  soft, moderately distended today.  Incisional wound vac taken down and midline wound without any erythema or drainage. Some leakage around J tube and some leakage around right blake drains.  Drains murky.   Ext:  SCDs in place  Lab Results:  Recent Labs    07/28/24 0600 07/30/24 0540  WBC 19.6* 24.6*  HGB 9.1* 8.6*  HCT 27.5* 26.0*  PLT 281 410*    BMET Recent Labs    07/29/24 0439 07/30/24 0540  NA 136 139  138  K 4.2 3.9  3.8  CL 102 101  100  CO2 25 24  24   GLUCOSE 211* 172*  169*  BUN 23 24*  25*  CREATININE 1.00 0.92  0.91  CALCIUM  7.7* 7.9*  7.8*   PT/INR No results for input(s): LABPROT, INR in the last 72 hours.  ABG No results for input(s): PHART, HCO3 in the last 72 hours.  Invalid input(s): PCO2, PO2   Studies/Results: No results found.    Anti-infectives: Anti-infectives (From admission, onward)    Start     Dose/Rate Route Frequency Ordered Stop   07/30/24 2300  vancomycin  (VANCOCIN ) IVPB 1000 mg/200 mL premix        1,000 mg 200 mL/hr over 60 Minutes Intravenous Every 12  hours 07/30/24 1029     07/30/24 1115  fluconazole  (DIFLUCAN ) IVPB 400 mg        400 mg 100 mL/hr over 120 Minutes Intravenous Every 24 hours 07/30/24 1025     07/30/24 1100  vancomycin  (VANCOREADY) IVPB 2000 mg/400 mL        2,000 mg 200 mL/hr over 120 Minutes Intravenous  Once 07/30/24 1010     07/28/24 0945  piperacillin -tazobactam (ZOSYN ) IVPB 3.375 g        3.375 g 12.5 mL/hr over 240 Minutes Intravenous Every 8 hours 07/28/24 0853     07/23/24 0000  ceFAZolin  (ANCEF ) IVPB 2g/100 mL premix        2 g 200 mL/hr over 30 Minutes Intravenous Every 8 hours 07/22/24 2314 07/23/24 0058   07/22/24 0900  ceFAZolin  (ANCEF ) IVPB 2g/100 mL premix        2 g 200 mL/hr over 30 Minutes Intravenous To ShortStay Surgical 07/21/24 1938 07/22/24 1432       Assessment/Plan: s/p Procedure(s): WHIPPLE PROCEDURE (N/A) CREATION, JEJUNOSTOMY (N/A)- Skyah Hannon POD 8- 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  pT3bN1 final path.    Prn morphine , oxy, robaxin , tylenol  T bili down to 1.2, stabilizing.  AKI, appears improved/resolved. ABL anemia, anemia chronic disease   Started zosyn  for pancreatic leak. Adding antifungal and vanc empirically.  Discussed with medicine.  Checking CXR  and considering switch to meropenem for better pulmonary coverage in case of pneumonia.   Pt at risk of reintubation today. Looking worse.  Canceling transfer to progressive.    Quite tachypneic.  Leaving NGT for now.  Given bloating, going down on tube feeds for now.  Doing 1/2 rate TPN today.    If more stable, will get CT c/a/p later today.    Defer management of blood sugar/HTN to medicine.  Pt had generous panc head and uncinate process so lost a good amount of functional pancreas.       LOS: 26 days   Jina LITTIE Nephew, MD, FACS, FSSO Surgical Oncology, General Surgery, Trauma and Critical [facility] Surgery, [place] [phone] for weekday/non holidays Check [email or web address]  for coverage night/weekend/holidays

## 2024-07-30 NOTE — Progress Notes (Signed)
 PT Cancellation Note  Patient Details Name: SYRAH DAUGHTREY MRN: [id number] DOB: [birth year]   Cancelled Treatment:    Reason Eval/Treat Not Completed: Medical issues which prohibited therapy. Spoke with RN who advised holding PT today. WBC count up. Chest xray ordered. Plan for CT today. Per surgery progress note, pt at increased risk for reintubation today. PT to follow.   Erven Sari Shaker 07/30/2024, 10:57 AM

## 2024-07-30 NOTE — Hospital Course (Addendum)
 73 y.o. 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

## 2024-07-30 NOTE — Progress Notes (Signed)
 [facility] Progress Note Patient: Lauren Gray FMW:[id number] DOB: Jun 29, 1951  DOA: 07/04/2024 DOS: the patient was seen and examined on 07/30/2024  Brief Hospital Course: 73 yo female presented 07/05/2023 due to gastric outlet obstruction with findings of underlying duodenal cancer.  The patient underwent a Whipple procedure on 07/22/2024.   Assessment and Plan: Duodenal adenocarcinoma with gastric outlet obstruction Status post Whipple's procedure and jejunostomy creation. CT scan showed dilated 1st and 2nd portion of duodenum with possible underlying mass. Patient was seen by gastroenterology and underwent small bowel enteroscopy.  The duodenal mass was biopsied.  Pathology was positive for adenocarcinoma. General surgery was consulted .  Patient has poor nutritional status.  Due to gastric outlet obstruction from the duodenal mass patient   started on TPN.   Due to persistent nausea and vomiting NG tube was inserted.   Surgery (Whipple procedure) was performed on 07/22/2024.  Was on Dilaudid  PCA patient family did not want IV Dilaudid ; want to avoid opioids as it caused confusion CT abdomen and chest on 9/17 shows no new changes. Due to worsening leukocytosis antibiotic regimen-broaden.  Bilateral pleural effusion. Reports of shortness of breath. CT chest negative for PE although altered study was not designed for PE. CT chest shows evidence of bilateral pleural effusion likely third spacing in the setting of hypoalbuminemia. Will treat with IV Lasix  and monitor response. Monitor BMP.   Nutrition Back on TPN due to concern for intra-abdominal pathology causing leukocytosis. Also on tube feeds via jejunostomy tube.  G-tube on hold.   Hypokalemia/hypophosphatemia Resolved.    Essential hypertension Blood pressure is elevated,  Continue current medications.   Hyperglycemia Continue sliding scale insulin  only for now   AKI on CKDII  Resolved monitor for reoccurrence after  CTs.   Mild elevated lipase Resolved  Anxiety and insomnia. Trazodone  at nighttime and Ativan  as needed.  Obesity Class 3 Body mass index is 40.9 kg/m.  Placing the pt at higher risk of poor outcomes.    Subjective: Reports she is very short of breath.  No nausea or vomiting.  Denies any chest pain or chest tightness.  Denies any abdominal pain.  Family reports that she feels like she is going to die.  Physical Exam: Clear to auscultation. S1-S2 present.  Tachycardic. Bowel sound present. Trace edema.  Data Reviewed: I have Reviewed nursing notes, Vitals, and Lab results. Since last encounter, pertinent lab results CBC BMP   . I have ordered test including CBC and BMP  . I have discussed pt's care plan and test results with general surgery  .   Disposition: Status is: Inpatient Remains inpatient appropriate because: Monitor for improvement in oral intake  heparin  injection 5,000 Units Start: 07/28/24 0600 SCD's Start: 07/22/24 2315 SCDs Start: 07/04/24 2321  Family Communication: Family at bedside Level of care: ICU management per surgery. Vitals:   07/30/24 1500 07/30/24 1600 07/30/24 1700 07/30/24 1800  BP: (!) 178/73 (!) 178/69 (!) 169/71 (!) 178/68  Pulse: 94 94 97 95  Resp: (!) 25 (!) 31 (!) 25 20  Temp:  98.5 F (36.9 C)    TempSrc:  Axillary    SpO2: 96% 94% 95% 94%  Weight:      Height:         Author: Yetta Blanch, MD 07/30/2024 6:40 PM  Please look on [email or web address] to find out who is on call.

## 2024-07-30 NOTE — Progress Notes (Signed)
 0730 patient wound vac not engaged ordered supplies to reseal

## 2024-07-30 NOTE — Progress Notes (Signed)
 Pharmacy Antibiotic Note  Lauren Gray is a 73 y.o. female admitted on 07/04/2024 with pancreatic leak.  Pharmacy has been consulted for vancomycin  dosing.  Plan: Vancomycin  2g IV x1 then vancomycin  1g IV q12 (eAUC 492, Scr 0.92) -Goal trough 15-66mcg/mL, Goal AUC 400-600 -F/u renal function, LOT, and culture data -F/u vancomycin  levels PRN per protocol F/u LOT cultures as applicable Zosyn  3.375 g IV q8h Fluconazole  400mg  IV q 24h  Height: 5' 8 (172.7 cm) Weight: 122 kg (268 lb 15.4 oz) IBW/kg (Calculated) : 63.9  Temp (24hrs), Avg:98.1 F (36.7 C), Min:97.4 F (36.3 C), Max:98.8 F (37.1 C)  Recent Labs  Lab 07/24/24 0412 07/25/24 0400 07/26/24 0413 07/27/24 0513 07/28/24 0600 07/29/24 0439 07/30/24 0540  WBC 19.0*  --   --   --  19.6*  --  24.6*  CREATININE 1.38*   < > 1.10*  0.95 1.21* 0.99 1.00 0.92  0.91   < > = values in this interval not displayed.    Estimated Creatinine Clearance: 74.9 mL/min (by C-G formula based on SCr of 0.92 mg/dL).    Allergies  Allergen Reactions   Codeine Anxiety    i don't feel right at all   Latex Rash    Antimicrobials this admission: Zosyn  9/15>  Vancomycin  9/17> Fluconazole  9/17>  Dose adjustments this admission:   Microbiology results: 9/9 MRSA neg  Thank you for allowing pharmacy to be a part of this patient's care.  Sharyne Glatter, PharmD, BCCCP Critical Care Clinical Pharmacist 07/30/2024 10:15 AM

## 2024-07-30 NOTE — Progress Notes (Signed)
   Inpatient Rehabilitation Admissions Coordinator   Noted Dr Dia plan for today. I will hold today on discussions on eventual rehab dispo given issues.  Heron Leavell, RN, MSN Rehab Admissions Coordinator [phone] 07/30/2024 10:51 AM

## 2024-07-30 NOTE — Plan of Care (Signed)
  Problem: Education: Goal: Knowledge of General Education information will improve Description: Including pain rating scale, medication(s)/side effects and non-pharmacologic comfort measures Outcome: Not Progressing   Problem: Health Behavior/Discharge Planning: Goal: Ability to manage health-related needs will improve Outcome: Not Progressing   Problem: Clinical Measurements: Goal: Ability to maintain clinical measurements within normal limits will improve Outcome: Not Progressing Goal: Will remain free from infection Outcome: Not Progressing Goal: Diagnostic test results will improve Outcome: Not Progressing Goal: Respiratory complications will improve Outcome: Not Progressing Goal: Cardiovascular complication will be avoided Outcome: Not Progressing   Problem: Activity: Goal: Risk for activity intolerance will decrease Outcome: Not Progressing   Problem: Nutrition: Goal: Adequate nutrition will be maintained Outcome: Not Progressing   Problem: Coping: Goal: Level of anxiety will decrease Outcome: Not Progressing   Problem: Elimination: Goal: Will not experience complications related to bowel motility Outcome: Not Progressing Goal: Will not experience complications related to urinary retention Outcome: Not Progressing   Problem: Pain Managment: Goal: General experience of comfort will improve and/or be controlled Outcome: Not Progressing   Problem: Safety: Goal: Ability to remain free from injury will improve Outcome: Not Progressing   Problem: Skin Integrity: Goal: Risk for impaired skin integrity will decrease Outcome: Not Progressing   Problem: Education: Goal: Ability to describe self-care measures that may prevent or decrease complications (Diabetes Survival Skills Education) will improve Outcome: Not Progressing Goal: Individualized Educational Video(s) Outcome: Not Progressing   Problem: Coping: Goal: Ability to adjust to condition or change in  health will improve Outcome: Not Progressing   Problem: Fluid Volume: Goal: Ability to maintain a balanced intake and output will improve Outcome: Not Progressing   Problem: Health Behavior/Discharge Planning: Goal: Ability to identify and utilize available resources and services will improve Outcome: Not Progressing Goal: Ability to manage health-related needs will improve Outcome: Not Progressing   Problem: Metabolic: Goal: Ability to maintain appropriate glucose levels will improve Outcome: Not Progressing   Problem: Nutritional: Goal: Maintenance of adequate nutrition will improve Outcome: Not Progressing Goal: Progress toward achieving an optimal weight will improve Outcome: Not Progressing   Problem: Skin Integrity: Goal: Risk for impaired skin integrity will decrease Outcome: Not Progressing   Problem: Tissue Perfusion: Goal: Adequacy of tissue perfusion will improve Outcome: Not Progressing Patient has increase in resp increase HR increase WBC increase in NG output and increase in abd pain today CT and xray completed lots of PRN meds given total care tube feeding decreased.

## 2024-07-31 DIAGNOSIS — K56609 Unspecified intestinal obstruction, unspecified as to partial versus complete obstruction: Secondary | ICD-10-CM | POA: Diagnosis not present

## 2024-07-31 LAB — CBC WITH DIFFERENTIAL/PLATELET
Abs Immature Granulocytes: 1.32 K/uL — ABNORMAL HIGH (ref 0.00–0.07)
Basophils Absolute: 0 K/uL (ref 0.0–0.1)
Basophils Relative: 0 %
Eosinophils Absolute: 0 K/uL (ref 0.0–0.5)
Eosinophils Relative: 0 %
HCT: 23 % — ABNORMAL LOW (ref 36.0–46.0)
Hemoglobin: 7.6 g/dL — ABNORMAL LOW (ref 12.0–15.0)
Immature Granulocytes: 5 %
Lymphocytes Relative: 11 %
Lymphs Abs: 2.9 K/uL (ref 0.7–4.0)
MCH: 29.8 pg (ref 26.0–34.0)
MCHC: 33 g/dL (ref 30.0–36.0)
MCV: 90.2 fL (ref 80.0–100.0)
Monocytes Absolute: 0.7 K/uL (ref 0.1–1.0)
Monocytes Relative: 3 %
Neutro Abs: 20.8 K/uL — ABNORMAL HIGH (ref 1.7–7.7)
Neutrophils Relative %: 81 %
Platelets: 497 K/uL — ABNORMAL HIGH (ref 150–400)
RBC: 2.55 MIL/uL — ABNORMAL LOW (ref 3.87–5.11)
RDW: 16.3 % — ABNORMAL HIGH (ref 11.5–15.5)
Smear Review: NORMAL
WBC: 25.9 K/uL — ABNORMAL HIGH (ref 4.0–10.5)
nRBC: 2.9 % — ABNORMAL HIGH (ref 0.0–0.2)

## 2024-07-31 LAB — TECHNOLOGIST SMEAR REVIEW: Plt Morphology: NORMAL

## 2024-07-31 LAB — RETICULOCYTES
Immature Retic Fract: 34.8 % — ABNORMAL HIGH (ref 2.3–15.9)
RBC.: 2.6 MIL/uL — ABNORMAL LOW (ref 3.87–5.11)
Retic Count, Absolute: 87.9 K/uL (ref 19.0–186.0)
Retic Ct Pct: 3.4 % — ABNORMAL HIGH (ref 0.4–3.1)

## 2024-07-31 LAB — GLUCOSE, CAPILLARY
Glucose-Capillary: 224 mg/dL — ABNORMAL HIGH (ref 70–99)
Glucose-Capillary: 227 mg/dL — ABNORMAL HIGH (ref 70–99)
Glucose-Capillary: 255 mg/dL — ABNORMAL HIGH (ref 70–99)
Glucose-Capillary: 179 mg/dL — ABNORMAL HIGH (ref 70–99)
Glucose-Capillary: 226 mg/dL — ABNORMAL HIGH (ref 70–99)

## 2024-07-31 LAB — FERRITIN: Ferritin: 1801 ng/mL — ABNORMAL HIGH (ref 11–307)

## 2024-07-31 LAB — COMPREHENSIVE METABOLIC PANEL WITH GFR
ALT: 37 U/L (ref 0–44)
AST: 37 U/L (ref 15–41)
Albumin: <1.5 g/dL — ABNORMAL LOW (ref 3.5–5.0)
Alkaline Phosphatase: 130 U/L — ABNORMAL HIGH (ref 38–126)
Anion gap: 14 (ref 5–15)
BUN: 28 mg/dL — ABNORMAL HIGH (ref 8–23)
CO2: 26 mmol/L (ref 22–32)
Calcium: 7.7 mg/dL — ABNORMAL LOW (ref 8.9–10.3)
Chloride: 100 mmol/L (ref 98–111)
Creatinine, Ser: 0.95 mg/dL (ref 0.44–1.00)
GFR, Estimated: >60 mL/min (ref >60)
Glucose, Bld: 216 mg/dL — ABNORMAL HIGH (ref 70–99)
Potassium: 4 mmol/L (ref 3.5–5.1)
Sodium: 140 mmol/L (ref 135–145)
Total Bilirubin: 2.2 mg/dL — ABNORMAL HIGH (ref 0.0–1.2)
Total Protein: 6 g/dL — ABNORMAL LOW (ref 6.5–8.1)

## 2024-07-31 LAB — CBC
HCT: 24.2 % — ABNORMAL LOW (ref 36.0–46.0)
HCT: 22.8 % — ABNORMAL LOW (ref 36.0–46.0)
Hemoglobin: 7.7 g/dL — ABNORMAL LOW (ref 12.0–15.0)
Hemoglobin: 7.4 g/dL — ABNORMAL LOW (ref 12.0–15.0)
MCH: 29.2 pg (ref 26.0–34.0)
MCH: 30.2 pg (ref 26.0–34.0)
MCHC: 31.8 g/dL (ref 30.0–36.0)
MCHC: 32.5 g/dL (ref 30.0–36.0)
MCV: 91.7 fL (ref 80.0–100.0)
MCV: 93.1 fL (ref 80.0–100.0)
Platelets: 494 K/uL — ABNORMAL HIGH (ref 150–400)
Platelets: 518 K/uL — ABNORMAL HIGH (ref 150–400)
RBC: 2.64 MIL/uL — ABNORMAL LOW (ref 3.87–5.11)
RBC: 2.45 MIL/uL — ABNORMAL LOW (ref 3.87–5.11)
RDW: 16.2 % — ABNORMAL HIGH (ref 11.5–15.5)
RDW: 16.8 % — ABNORMAL HIGH (ref 11.5–15.5)
WBC: 27 K/uL — ABNORMAL HIGH (ref 4.0–10.5)
WBC: 26.6 K/uL — ABNORMAL HIGH (ref 4.0–10.5)
nRBC: 2.2 % — ABNORMAL HIGH (ref 0.0–0.2)
nRBC: 4.2 % — ABNORMAL HIGH (ref 0.0–0.2)

## 2024-07-31 LAB — PHOSPHORUS: Phosphorus: 3.2 mg/dL (ref 2.5–4.6)

## 2024-07-31 LAB — PROTIME-INR
INR: 1.2 (ref 0.8–1.2)
Prothrombin Time: 15.7 s — ABNORMAL HIGH (ref 11.4–15.2)

## 2024-07-31 LAB — MAGNESIUM: Magnesium: 2.2 mg/dL (ref 1.7–2.4)

## 2024-07-31 LAB — VITAMIN B12: Vitamin B-12: 1153 pg/mL — ABNORMAL HIGH (ref 180–914)

## 2024-07-31 LAB — IRON AND TIBC
Iron: 72 ug/dL (ref 28–170)
Saturation Ratios: 40 % — ABNORMAL HIGH (ref 10.4–31.8)
TIBC: 182 ug/dL — ABNORMAL LOW (ref 250–450)
UIBC: 110 ug/dL

## 2024-07-31 LAB — FOLATE: Folate: 12.8 ng/mL (ref >5.9)

## 2024-07-31 MED ORDER — QUETIAPINE FUMARATE 25 MG PO TABS
12.5000 mg | ORAL_TABLET | Freq: Once | ORAL | Status: AC
  Administered 2024-07-31: 12.5 mg via ORAL
  Filled 2024-07-31: qty 1

## 2024-07-31 MED ORDER — TRAVASOL 10 % IV SOLN
INTRAVENOUS | Status: AC
  Filled 2024-07-31: qty 508.8

## 2024-07-31 MED ORDER — DIAZEPAM 5 MG/ML IJ SOLN: 5.0000 mg | Freq: Every evening | INTRAMUSCULAR | Status: DC | PRN

## 2024-07-31 MED ORDER — TRAZODONE HCL 50 MG PO TABS
50.0000 mg | ORAL_TABLET | Freq: Every day | ORAL | Status: DC
  Administered 2024-07-31 – 2024-08-14 (×14): 50 mg
  Filled 2024-07-31 (×15): qty 1

## 2024-07-31 MED ORDER — LORAZEPAM 2 MG/ML IJ SOLN
0.5000 mg | INTRAMUSCULAR | Status: DC | PRN
  Administered 2024-07-31 – 2024-08-02 (×3): 0.5 mg via INTRAVENOUS
  Filled 2024-07-31 (×3): qty 1

## 2024-07-31 MED ORDER — INSULIN ASPART 100 UNIT/ML IJ SOLN
3.0000 [IU] | INTRAMUSCULAR | Status: DC
  Administered 2024-07-31 – 2024-08-01 (×6): 3 [IU] via SUBCUTANEOUS

## 2024-07-31 MED ORDER — PROCHLORPERAZINE EDISYLATE 10 MG/2ML IJ SOLN: 10.0000 mg | Freq: Four times a day (QID) | INTRAMUSCULAR | Status: DC | PRN

## 2024-07-31 MED ORDER — TRAZODONE HCL 50 MG PO TABS: 50.0000 mg | ORAL_TABLET | Freq: Every day | ORAL | Status: DC

## 2024-07-31 MED ORDER — CLONIDINE HCL 0.1 MG/24HR TD PTWK
0.1000 mg | MEDICATED_PATCH | TRANSDERMAL | Status: DC
Start: 2024-07-31 — End: 2024-08-08
  Administered 2024-07-31 – 2024-08-07 (×2): 0.1 mg via TRANSDERMAL
  Filled 2024-07-31 (×2): qty 1

## 2024-07-31 NOTE — Progress Notes (Signed)
 9 Days Post-Op   Subjective/Chief Complaint: Pt had CT yesterday for tachypnea and leukocytosis.  Added vanc and fluconazole .   Abd showed inflammatory changes but no undrained fluid collection.  Chest + infiltrates.  Turned down tube feeds for bloating.  Pt had 2 BMs.  Pt pulled out NGT last night.  Pt denies n/v with NGT out.      Objective: Vital signs in last 24 hours: Temp:  [97.9 F (36.6 C)-99 F (37.2 C)] 98.5 F (36.9 C) (09/18 0810) Pulse Rate:  [90-109] 98 (09/18 0700) Resp:  [20-42] 31 (09/18 0800) BP: (103-189)/(51-133) 164/65 (09/18 0800) SpO2:  [90 %-96 %] 93 % (09/18 0800) Weight:  [877 kg] 122 kg (09/18 0500) Last BM Date : 07/30/24  Intake/Output from previous day: 09/17 0701 - 09/18 0700 In: 3418.1 [I.V.:1319.9; NG/GT:1035.5; IV Piggyback:1062.7] Out: 3220 [Urine:2375; Emesis/NG output:400; Drains:445] Intake/Output this shift: Total I/O In: 92.4 [I.V.:39.9; NG/GT:40; IV Piggyback:12.4] Out: -   General: slight distress.   Resp:  tachypneic CV:  sl tachycardic Abd:  soft, midline wound without any erythema or drainage. Some leakage around J tube and some leakage around right blake drains.  Drains murky Ext:  SCDs in place  Lab Results:  Recent Labs    07/30/24 0540 07/31/24 0609  WBC 24.6* 27.0*  HGB 8.6* 7.7*  HCT 26.0* 24.2*  PLT 410* 494*    BMET Recent Labs    07/29/24 0439 07/30/24 0540  NA 136 139  138  K 4.2 3.9  3.8  CL 102 101  100  CO2 25 24  24   GLUCOSE 211* 172*  169*  BUN 23 24*  25*  CREATININE 1.00 0.92  0.91  CALCIUM  7.7* 7.9*  7.8*   PT/INR No results for input(s): LABPROT, INR in the last 72 hours.  ABG No results for input(s): PHART, HCO3 in the last 72 hours.  Invalid input(s): PCO2, PO2   Studies/Results: CT CHEST ABDOMEN PELVIS W CONTRAST Result Date: 07/30/2024 CLINICAL DATA:  Sepsis. EXAM: CT CHEST, ABDOMEN, AND PELVIS WITH CONTRAST TECHNIQUE: Multidetector CT imaging of the chest,  abdomen and pelvis was performed following the standard protocol during bolus administration of intravenous contrast. RADIATION DOSE REDUCTION: This exam was performed according to the departmental dose-optimization program which includes automated exposure control, adjustment of the mA and/or kV according to patient size and/or use of iterative reconstruction technique. CONTRAST:  75mL OMNIPAQUE  IOHEXOL  350 MG/ML SOLN COMPARISON:  July 04, 2024. FINDINGS: CT CHEST FINDINGS Cardiovascular: No significant vascular findings. Normal heart size. No pericardial effusion. Mediastinum/Nodes: No enlarged mediastinal, hilar, or axillary lymph nodes. Thyroid gland, trachea, and esophagus demonstrate no significant findings. Lungs/Pleura: Small to moderate size bilateral pleural effusions are noted with adjacent subsegmental atelectasis. No pneumothorax is noted. Patchy airspace opacities are noted anteriorly in left upper lobe concerning for multifocal pneumonia. Musculoskeletal: No chest wall mass or suspicious bone lesions identified. CT ABDOMEN PELVIS FINDINGS Hepatobiliary: Gallbladder is not visualized and presumably has been removed. No abnormal bowel dilatation. Left hepatic cyst is again noted and unchanged. Pancreas: Large amount of inflammatory stranding and fluid is seen in the region of the pancreatic head and body consistent with history of recent Whipple's procedure. Pancreatic tail is unremarkable. Two surgical drains are seen entering right upper quadrant with tips in or near surgical bed. Spleen: Normal in size without focal abnormality. Adrenals/Urinary Tract: Adrenal glands and kidneys appear normal. No hydronephrosis or renal obstruction is noted. Urinary bladder is decompressed. Stomach/Bowel: Nasogastric tube tip  is seen within the gastric lumen. There is no evidence of bowel obstruction or inflammation. The appendix is not visualized. Jejunostomy tube is seen entering left lower quadrant.  Vascular/Lymphatic: Aortic atherosclerosis. No enlarged abdominal or pelvic lymph nodes. Reproductive: Status post hysterectomy. No adnexal masses. Other: No ascites or hernia is noted. Musculoskeletal: No acute or significant osseous findings. IMPRESSION: 1. Small to moderate size bilateral pleural effusions are noted with adjacent subsegmental atelectasis. 2. Patchy airspace opacities are noted anteriorly in left upper lobe concerning for multifocal pneumonia. 3. Large amount of inflammatory stranding and fluid is seen in the region of the pancreatic head and body consistent with history of recent Whipple's procedure. Two surgical drains are seen entering right upper quadrant with tips in or near surgical bed. 4. Aortic atherosclerosis. Aortic Atherosclerosis (ICD10-I70.0). Electronically Signed   By: Lynwood Landy Raddle M.D.   On: 07/30/2024 12:46   DG CHEST PORT 1 VIEW Result Date: 07/30/2024 EXAM: 1 VIEW XRAY OF THE CHEST 07/30/2024 11:05:00 AM COMPARISON: 07/27/2024 CLINICAL HISTORY: Hypoxia 200808. Reason for exam: hypoxia FINDINGS: LINES, TUBES AND DEVICES: Enteric tube in place with tip and side port overlying the stomach. Right PICC in place with tip overlying the expected region of the distal superior vena cava. LUNGS AND PLEURA: Low lung volumes. Bibasilar atelectasis. Mild diffuse interstitial prominence. Slightly increased right mid and lower lung opacities. HEART AND MEDIASTINUM: No acute abnormality of the cardiac and mediastinal silhouettes. BONES AND SOFT TISSUES: No acute osseous abnormality. IMPRESSION: 1. Low lung volumes with bibasilar atelectasis and mild diffuse interstitial prominence. 2. Slightly increased right mid and lower lung opacities. Electronically signed by: Donnice Mania MD 07/30/2024 12:42 PM EDT RP Workstation: [id number]      Anti-infectives: Anti-infectives (From admission, onward)    Start     Dose/Rate Route Frequency Ordered Stop   07/30/24 2300  vancomycin   (VANCOCIN ) IVPB 1000 mg/200 mL premix        1,000 mg 200 mL/hr over 60 Minutes Intravenous Every 12 hours 07/30/24 1029     07/30/24 1400  piperacillin -tazobactam (ZOSYN ) IVPB 3.375 g        3.375 g 12.5 mL/hr over 240 Minutes Intravenous Every 8 hours 07/30/24 1225     07/30/24 1115  fluconazole  (DIFLUCAN ) IVPB 400 mg        400 mg 100 mL/hr over 120 Minutes Intravenous Every 24 hours 07/30/24 1025     07/30/24 1100  vancomycin  (VANCOREADY) IVPB 2000 mg/400 mL        2,000 mg 200 mL/hr over 120 Minutes Intravenous  Once 07/30/24 1010 07/30/24 1540   07/28/24 0945  piperacillin -tazobactam (ZOSYN ) IVPB 3.375 g  Status:  Discontinued        3.375 g 12.5 mL/hr over 240 Minutes Intravenous Every 8 hours 07/28/24 0853 07/30/24 1131   07/23/24 0000  ceFAZolin  (ANCEF ) IVPB 2g/100 mL premix        2 g 200 mL/hr over 30 Minutes Intravenous Every 8 hours 07/22/24 2314 07/23/24 0058   07/22/24 0900  ceFAZolin  (ANCEF ) IVPB 2g/100 mL premix        2 g 200 mL/hr over 30 Minutes Intravenous To ShortStay Surgical 07/21/24 1938 07/22/24 1432       Assessment/Plan: s/p Procedure(s): WHIPPLE PROCEDURE (N/A) CREATION, JEJUNOSTOMY (N/A)- Jaryn Rosko POD 9- 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  pT3bN1 final path.    Prn morphine , oxy, robaxin , tylenol  T bili down normal.   AKI, appears improved/resolved. ABL anemia, anemia chronic disease  Zosyn /fluc/vanc for panc leak and pneumonia.   Leave out NGT, sips of clears OK.  Would not advance for a while.  Ok for patient to have reglan if Qtc can tolerate that.   Leave in ICU again today.   Defer management of blood sugar/HTN to medicine.       LOS: 27 days   Jina LITTIE Nephew, MD, FACS, FSSO Surgical Oncology, General Surgery, Trauma and Critical [facility] Surgery, [place] [phone] for weekday/non holidays Check [email or web address] for coverage night/weekend/holidays

## 2024-07-31 NOTE — Progress Notes (Signed)
 Patient removed NG tube at 0340. Stated she tired of it and did not want it anymore. Patient A&Ox3. MD notifed.

## 2024-07-31 NOTE — Progress Notes (Signed)
 PT Cancellation Note  Patient Details Name: Lauren Gray MRN: [id number] DOB: 1951/03/10   Cancelled Treatment:    Reason Eval/Treat Not Completed: Patient declined, no reason specified - Pt states that she is too fatigued to participate in therapy today even with encouragement. Pt requests to be seen tomorrow. PT to check in tomorrow.    Quintin Campi, SPT  Acute Rehab  [phone]    Quintin Campi 07/31/2024, 1:50 PM

## 2024-07-31 NOTE — Progress Notes (Signed)
   Inpatient Rehabilitation Admissions Coordinator   Patient declined to work with therapy today. I await further progress with therapy to assist with planning dispo.  Heron Leavell, RN, MSN Rehab Admissions Coordinator [phone] 07/31/2024 3:27 PM

## 2024-07-31 NOTE — Progress Notes (Signed)
 2045 patient requesting something to calm her nerves. Nightly meds given and PRN ativan  without much relief. Patient becoming more restless, anxious and tearful. Stating she feels like she is choking and wants the NG tube out. Reached out the Hospitalist and new orders for additional trazodone  and valium  given at 2235.   Patient had minimal relief. Still restless and agitated. Constantly removing nasal cannula, O2 stickers, throwing covers on and off. Offered to help reposition and reach out to MD for further orders and patient and sister declined.

## 2024-07-31 NOTE — Progress Notes (Signed)
 [facility] Progress Note Patient: Lauren Gray FMW:[id number] DOB: March 13, 1951  DOA: 07/04/2024 DOS: the patient was seen and examined on 07/31/2024  Brief Hospital Course: 73 yo female presented 07/05/2023 due to gastric outlet obstruction with findings of underlying duodenal cancer.  The patient underwent a Whipple procedure on 07/22/2024.   Assessment and Plan: Duodenal adenocarcinoma with gastric outlet obstruction Status post Whipple's procedure and jejunostomy creation. CT scan showed dilated 1st and 2nd portion of duodenum with possible underlying mass. Patient was seen by gastroenterology and underwent small bowel enteroscopy.  The duodenal mass was biopsied.  Pathology was positive for adenocarcinoma. General surgery was consulted .  Patient has poor nutritional status.  Due to gastric outlet obstruction from the duodenal mass patient   started on TPN.   Due to persistent nausea and vomiting NG tube was inserted.   Surgery (Whipple procedure) was performed on 07/22/2024.  Was on Dilaudid  PCA patient family did not want IV Dilaudid ; want to avoid opioids as it caused confusion CT abdomen and chest on 9/17 shows no new changes. Due to worsening leukocytosis antibiotic regimen-broaden.  Bilateral pleural effusion. Reports of shortness of breath. CT chest negative for PE although altered study was not designed for PE. CT chest shows evidence of bilateral pleural effusion likely third spacing in the setting of hypoalbuminemia. Repeat chest x-ray tomorrow. Monitor BMP.   Nutrition Back on TPN due to concern for intra-abdominal pathology causing leukocytosis. Also on tube feeds via jejunostomy tube.  G-tube on hold.   Hypokalemia/hypophosphatemia Resolved.    Essential hypertension Blood pressure is elevated,  Continue current medications.   Hyperglycemia Continue sliding scale insulin  only for now   AKI on CKDII  Resolved monitor for reoccurrence after CTs.   Mild  elevated lipase Resolved  Anxiety and insomnia. Trazodone  at nighttime and Ativan  as needed.  Obesity Class 3 Body mass index is 40.9 kg/m.  Placing the pt at higher risk of poor outcomes.   Anemia. Normocytic.  Hemoglobin drifting down towards 7. Transfuse hemoglobin less than 7 or hemodynamic instability. Iron level normal.  Folic acid within range.  B12 normal.  Smear does not show any evidence of exercise.  Reticulocyte count elevated but CT abdomen negative for any acute blood loss.  Monitor.   Subjective: Abdominal pain improving.  No nausea no vomiting No chills.  Has some shortness of breath as well as cough.  Physical Exam: Bilateral basal crackles. S1-S2 present Bowel sounds present. Trace edema.  Data Reviewed: I have Reviewed nursing notes, Vitals, and Lab results. Since last encounter, pertinent lab results CBC and BMP   . I have ordered test including CBC and BMP  . I have discussed pt's care plan and test results with general surgery  . I have ordered imaging chest x-ray  .   Disposition: Status is: Inpatient Remains inpatient appropriate because: Need for improvement in respiratory status and oral intake  SCD's Start: 07/22/24 2315 SCDs Start: 07/04/24 2321   Family Communication: Family at bedside Level of care: ICU per surgery Vitals:   07/31/24 1524 07/31/24 1600 07/31/24 1700 07/31/24 1800  BP: (!) 184/73 (!) 179/68 (!) 144/61 (!) 163/67  Pulse:  (!) 114 (!) 102 (!) 103  Resp:  (!) 27 (!) 24 (!) 27  Temp: (!) 97.3 F (36.3 C)     TempSrc: Oral     SpO2:  (!) 88% 92% 92%  Weight:      Height:  Author: Yetta Blanch, MD 07/31/2024 7:35 PM  Please look on [email or web address] to find out who is on call.

## 2024-08-01 ENCOUNTER — Inpatient Hospital Stay (HOSPITAL_COMMUNITY)

## 2024-08-01 ENCOUNTER — Ambulatory Visit: Payer: Self-pay | Admitting: Nurse Practitioner

## 2024-08-01 DIAGNOSIS — K56609 Unspecified intestinal obstruction, unspecified as to partial versus complete obstruction: Secondary | ICD-10-CM | POA: Diagnosis not present

## 2024-08-01 LAB — BASIC METABOLIC PANEL WITH GFR
Anion gap: 13 (ref 5–15)
Anion gap: 10 (ref 5–15)
BUN: 29 mg/dL — ABNORMAL HIGH (ref 8–23)
BUN: 36 mg/dL — ABNORMAL HIGH (ref 8–23)
CO2: 24 mmol/L (ref 22–32)
CO2: 28 mmol/L (ref 22–32)
Calcium: 6.9 mg/dL — ABNORMAL LOW (ref 8.9–10.3)
Calcium: 7.5 mg/dL — ABNORMAL LOW (ref 8.9–10.3)
Chloride: 106 mmol/L (ref 98–111)
Chloride: 107 mmol/L (ref 98–111)
Creatinine, Ser: 1.08 mg/dL — ABNORMAL HIGH (ref 0.44–1.00)
Creatinine, Ser: 1.2 mg/dL — ABNORMAL HIGH (ref 0.44–1.00)
GFR, Estimated: 54 mL/min — ABNORMAL LOW (ref >60)
GFR, Estimated: 48 mL/min — ABNORMAL LOW (ref >60)
Glucose, Bld: 410 mg/dL — ABNORMAL HIGH (ref 70–99)
Glucose, Bld: 265 mg/dL — ABNORMAL HIGH (ref 70–99)
Potassium: 5.6 mmol/L — ABNORMAL HIGH (ref 3.5–5.1)
Potassium: 4 mmol/L (ref 3.5–5.1)
Sodium: 143 mmol/L (ref 135–145)
Sodium: 145 mmol/L (ref 135–145)

## 2024-08-01 LAB — COMPREHENSIVE METABOLIC PANEL WITH GFR
ALT: 51 U/L — ABNORMAL HIGH (ref 0–44)
AST: 42 U/L — ABNORMAL HIGH (ref 15–41)
Albumin: <1.5 g/dL — ABNORMAL LOW (ref 3.5–5.0)
Alkaline Phosphatase: 135 U/L — ABNORMAL HIGH (ref 38–126)
Anion gap: 10 (ref 5–15)
BUN: 35 mg/dL — ABNORMAL HIGH (ref 8–23)
CO2: 27 mmol/L (ref 22–32)
Calcium: 7.6 mg/dL — ABNORMAL LOW (ref 8.9–10.3)
Chloride: 106 mmol/L (ref 98–111)
Creatinine, Ser: 1.23 mg/dL — ABNORMAL HIGH (ref 0.44–1.00)
GFR, Estimated: 46 mL/min — ABNORMAL LOW (ref >60)
Glucose, Bld: 170 mg/dL — ABNORMAL HIGH (ref 70–99)
Potassium: 4 mmol/L (ref 3.5–5.1)
Sodium: 143 mmol/L (ref 135–145)
Total Bilirubin: 1.9 mg/dL — ABNORMAL HIGH (ref 0.0–1.2)
Total Protein: 5.6 g/dL — ABNORMAL LOW (ref 6.5–8.1)

## 2024-08-01 LAB — CBC
HCT: 20.8 % — ABNORMAL LOW (ref 36.0–46.0)
HCT: 23 % — ABNORMAL LOW (ref 36.0–46.0)
HCT: 19 % — ABNORMAL LOW (ref 36.0–46.0)
Hemoglobin: 6.6 g/dL — CL (ref 12.0–15.0)
Hemoglobin: 7.6 g/dL — ABNORMAL LOW (ref 12.0–15.0)
Hemoglobin: 6.4 g/dL — CL (ref 12.0–15.0)
MCH: 30 pg (ref 26.0–34.0)
MCH: 31 pg (ref 26.0–34.0)
MCH: 31.4 pg (ref 26.0–34.0)
MCHC: 31.7 g/dL (ref 30.0–36.0)
MCHC: 33 g/dL (ref 30.0–36.0)
MCHC: 33.7 g/dL (ref 30.0–36.0)
MCV: 94.5 fL (ref 80.0–100.0)
MCV: 93.9 fL (ref 80.0–100.0)
MCV: 93.1 fL (ref 80.0–100.0)
Platelets: 513 K/uL — ABNORMAL HIGH (ref 150–400)
Platelets: 491 K/uL — ABNORMAL HIGH (ref 150–400)
Platelets: 453 K/uL — ABNORMAL HIGH (ref 150–400)
RBC: 2.2 MIL/uL — ABNORMAL LOW (ref 3.87–5.11)
RBC: 2.45 MIL/uL — ABNORMAL LOW (ref 3.87–5.11)
RBC: 2.04 MIL/uL — ABNORMAL LOW (ref 3.87–5.11)
RDW: 17.2 % — ABNORMAL HIGH (ref 11.5–15.5)
RDW: 17.5 % — ABNORMAL HIGH (ref 11.5–15.5)
RDW: 17.2 % — ABNORMAL HIGH (ref 11.5–15.5)
WBC: 24 K/uL — ABNORMAL HIGH (ref 4.0–10.5)
WBC: 22.6 K/uL — ABNORMAL HIGH (ref 4.0–10.5)
WBC: 18.6 K/uL — ABNORMAL HIGH (ref 4.0–10.5)
nRBC: 6.3 % — ABNORMAL HIGH (ref 0.0–0.2)
nRBC: 6.5 % — ABNORMAL HIGH (ref 0.0–0.2)
nRBC: 8.1 % — ABNORMAL HIGH (ref 0.0–0.2)

## 2024-08-01 LAB — GLUCOSE, CAPILLARY
Glucose-Capillary: 146 mg/dL — ABNORMAL HIGH (ref 70–99)
Glucose-Capillary: 158 mg/dL — ABNORMAL HIGH (ref 70–99)
Glucose-Capillary: 165 mg/dL — ABNORMAL HIGH (ref 70–99)
Glucose-Capillary: 172 mg/dL — ABNORMAL HIGH (ref 70–99)
Glucose-Capillary: 187 mg/dL — ABNORMAL HIGH (ref 70–99)
Glucose-Capillary: 188 mg/dL — ABNORMAL HIGH (ref 70–99)
Glucose-Capillary: 265 mg/dL — ABNORMAL HIGH (ref 70–99)

## 2024-08-01 LAB — PREPARE RBC (CROSSMATCH)

## 2024-08-01 LAB — HAPTOGLOBIN: Haptoglobin: 280 mg/dL (ref 42–346)

## 2024-08-01 LAB — VANCOMYCIN, RANDOM: Vancomycin Rm: 13 ug/mL

## 2024-08-01 MED ORDER — INSULIN ASPART 100 UNIT/ML IJ SOLN
5.0000 [IU] | INTRAMUSCULAR | Status: DC
  Administered 2024-08-01 – 2024-08-08 (×38): 5 [IU] via SUBCUTANEOUS

## 2024-08-01 MED ORDER — CEFTRIAXONE SODIUM 1 G IJ SOLR
1.0000 g | Freq: Once | INTRAMUSCULAR | Status: AC
  Administered 2024-08-02: 1 g via INTRAMUSCULAR
  Filled 2024-08-01: qty 10

## 2024-08-01 MED ORDER — VANCOMYCIN VARIABLE DOSE PER UNSTABLE RENAL FUNCTION (PHARMACIST DOSING)
Status: DC
Start: 2024-08-01 — End: 2024-08-01

## 2024-08-01 MED ORDER — SODIUM CHLORIDE 0.9% IV SOLUTION: Freq: Once | INTRAVENOUS | Status: AC

## 2024-08-01 MED ORDER — PIPERACILLIN-TAZOBACTAM 3.375 G IVPB
3.3750 g | Freq: Three times a day (TID) | INTRAVENOUS | Status: AC
  Administered 2024-08-02 – 2024-08-09 (×23): 3.375 g via INTRAVENOUS
  Filled 2024-08-01 (×22): qty 50

## 2024-08-01 MED ORDER — SODIUM CHLORIDE 0.9 % IV SOLN: INTRAVENOUS | Status: DC

## 2024-08-01 MED ORDER — OSMOLITE 1.5 CAL PO LIQD
1000.0000 mL | ORAL | Status: DC
  Administered 2024-08-01 – 2024-08-03 (×4): 1000 mL
  Filled 2024-08-01 (×6): qty 1000

## 2024-08-01 MED ORDER — LIDOCAINE HCL 1 % IJ SOLN
2.1000 mL | Freq: Once | INTRAMUSCULAR | Status: AC
  Administered 2024-08-02: 2.1 mL
  Filled 2024-08-01 (×2): qty 2.1

## 2024-08-01 MED ORDER — SODIUM CHLORIDE 0.9% IV SOLUTION: Freq: Once | INTRAVENOUS | Status: DC

## 2024-08-01 MED ORDER — TRAVASOL 10 % IV SOLN
INTRAVENOUS | Status: AC
  Filled 2024-08-01: qty 508.8

## 2024-08-01 MED ORDER — VANCOMYCIN HCL IN DEXTROSE 1-5 GM/200ML-% IV SOLN
1000.0000 mg | Freq: Two times a day (BID) | INTRAVENOUS | Status: DC
  Administered 2024-08-01 – 2024-08-06 (×10): 1000 mg via INTRAVENOUS
  Filled 2024-08-01 (×10): qty 200

## 2024-08-01 NOTE — Progress Notes (Signed)
   08/01/24 9561  Provider Notification  Provider Name/Title Andrez NP  Date Provider Notified 08/01/24  Time Provider Notified [phone]  Method of Notification Page  Notification Reason Critical Result  Test performed and critical result Hgb 6.6  Date Critical Result Received 08/01/24  Time Critical Result Received 0440  Provider response See new orders  Date of Provider Response 08/01/24  Time of Provider Response 0506   1 unit PRBC ordered

## 2024-08-01 NOTE — Progress Notes (Signed)
   08/01/24 0033  Provider Notification  Provider Name/Title Leonor Dawn MD  Date Provider Notified 08/01/24  Time Provider Notified [phone]  Method of Notification Page  Notification Reason Other (Comment) (both JP drains to gravity (uncharged) but order still states JPs to bulb suction, both leaking at site)  Provider response No new orders (leave to gravity (uncharged) per MD)  Date of Provider Response 08/01/24  Time of Provider Response [phone]

## 2024-08-01 NOTE — Progress Notes (Signed)
 Physical Therapy Treatment Patient Details Name: Lauren Gray MRN: [id number] DOB: 1951/06/30 Today's Date: 08/01/2024   History of Present Illness Pt is a 73 y/o female presenting with 1 month of nausea/vomiting. CT abdomen showed SBO from underlying mass. Mass biopsy showed + invasive adenocarcinoma. Requiring TPN. S/p pancreaticoduodenectomy, pancreatic duct stent placement, J-tube placement 9/9. PMH: hypokalemia, HTN, CKD, prediabetes    PT Comments  Pt in bed at start of session and agreeable to therapy. Pt progressing mobility well during today's session. Pt requiring modA +2 for bed mobility, transfers, and ambulation with RW at this time. Pt able to walk from bed to recliner with no overt LOB but limited by fatigue and abdomen pain. PT took pt through some exercises to continue progressing BLE strength. Patient will benefit from intensive inpatient follow-up therapy, >3 hours/day to progress mobility and activity tolerance to return to baseline. PT to follow acutely.    If plan is discharge home, recommend the following: A lot of help with walking and/or transfers;A little help with bathing/dressing/bathroom;Assist for transportation;Help with stairs or ramp for entrance;Assistance with cooking/housework   Can travel by Training and development officer (2 wheels)    Recommendations for Other Services       Precautions / Restrictions Precautions Precautions: Fall;Other (comment) Precaution/Restrictions Comments: JP drainx2 (L), NGT Restrictions Weight Bearing Restrictions Per Provider Order: No     Mobility  Bed Mobility Overal bed mobility: Needs Assistance Bed Mobility: Supine to Sit     Supine to sit: Mod assist, +2 for physical assistance     General bed mobility comments: Assist for trunk and LE management. Cues for sequencing. Use of bed pad    Transfers Overall transfer level: Needs assistance Equipment used: Rolling walker (2  wheels) Transfers: Sit to/from Stand Sit to Stand: Mod assist, +2 physical assistance           General transfer comment: Assist for power up and steadying with use of bed pad    Ambulation/Gait Ambulation/Gait assistance: +2 physical assistance, +2 safety/equipment, Mod assist Gait Distance (Feet): 6 Feet Assistive device: Rolling walker (2 wheels) Gait Pattern/deviations: Step-through pattern, Decreased stride length, Shuffle, Trunk flexed Gait velocity: Decreased     General Gait Details: Cues for walker management and lines. Mod A to steady. Cues for upright   Stairs             Wheelchair Mobility     Tilt Bed    Modified Rankin (Stroke Patients Only)       Balance Overall balance assessment: Needs assistance Sitting-balance support: Feet supported, Single extremity supported Sitting balance-Leahy Scale: Fair Sitting balance - Comments: Able to support self upright using bed rail   Standing balance support: During functional activity, Bilateral upper extremity supported, Reliant on assistive device for balance Standing balance-Leahy Scale: Poor Standing balance comment: Benefits from using RW                            Communication Communication Communication: No apparent difficulties  Cognition Arousal: Alert Behavior During Therapy: Flat affect   PT - Cognitive impairments: Initiation, Sequencing                       PT - Cognition Comments: Cues for sequencing. Increased time to process cues Following commands: Intact      Cueing Cueing Techniques: Verbal cues  Exercises General  Exercises - Lower Extremity Ankle Circles/Pumps: AROM, Both, 10 reps, Seated Long Arc Quad: AROM, Both, 10 reps, Seated    General Comments General comments (skin integrity, edema, etc.): VSS on 5L O2      Pertinent Vitals/Pain Pain Assessment Pain Assessment: Faces Faces Pain Scale: Hurts a little bit Pain Location: Abdomen with  activity Pain Descriptors / Indicators: Grimacing, Aching Pain Intervention(s): Limited activity within patient's tolerance, Monitored during session, Premedicated before session    Home Living                          Prior Function            PT Goals (current goals can now be found in the care plan section) Acute Rehab PT Goals Patient Stated Goal: Return to independence PT Goal Formulation: With patient Time For Goal Achievement: 08/08/24 Potential to Achieve Goals: Good Progress towards PT goals: Progressing toward goals    Frequency    Min 3X/week      PT Plan      Co-evaluation              AM-PAC PT 6 Clicks Mobility   Outcome Measure  Help needed turning from your back to your side while in a flat bed without using bedrails?: A Lot Help needed moving from lying on your back to sitting on the side of a flat bed without using bedrails?: A Lot Help needed moving to and from a bed to a chair (including a wheelchair)?: A Lot Help needed standing up from a chair using your arms (e.g., wheelchair or bedside chair)?: A Lot Help needed to walk in hospital room?: A Lot Help needed climbing 3-5 steps with a railing? : Total 6 Click Score: 11    End of Session Equipment Utilized During Treatment: Oxygen Activity Tolerance: Patient tolerated treatment well Patient left: in chair;with call bell/phone within reach;with chair alarm set;with family/visitor present Nurse Communication: Mobility status PT Visit Diagnosis: Other abnormalities of gait and mobility (R26.89);Pain;Muscle weakness (generalized) (M62.81) Pain - part of body:  (Abdomen)     Time: 8874-8846 PT Time Calculation (min) (ACUTE ONLY): 28 min  Charges:    $Therapeutic Activity: 23-37 mins PT General Charges $$ ACUTE PT VISIT: 1 Visit                     Quintin Campi, SPT  Acute Rehab  [phone]    Quintin Campi 08/01/2024, 1:22 PM

## 2024-08-01 NOTE — Progress Notes (Signed)
 HGB 6.4, RN aware.

## 2024-08-01 NOTE — Progress Notes (Signed)
 Pt has dual lumen PICC line with 1 using for TPN and 1 for blood transfusion tonight. Due to delay in zosyn , we will give 1 dose of ceftriaxone  1g IM x1 to bridge after discussing with Dr. Charlton.  Sergio Batch, PharmD, BCIDP, AAHIVP, CPP Infectious Disease Pharmacist 08/01/2024 10:41 PM

## 2024-08-01 NOTE — Progress Notes (Signed)
 PHARMACY - TOTAL PARENTERAL NUTRITION CONSULT NOTE  Indication: Gastric outlet obstruction 2nd to mass   Patient Measurements: Height: 5' 8 (172.7 cm) Weight: 122 kg (268 lb 15.4 oz) IBW/kg (Calculated) : 63.9 TPN AdjBW (KG): 74.7 Body mass index is 40.9 kg/m.   Assessment:  Pharmacy consulted to manage TPN for nutrition support in [name] with gastric outlet obstruction 2nd mass.  PMH: HLD, prediabetes, CKDII does not take any medication on regular basis, past surgical h/o S/P total hysterectomy and BSO   Glucose / Insulin : hx preDM (A1c 6.1 in Aug 2025) Used 31 units rSSI since new bag was hung + 45u insulin  in TPN  Electrolytes: all WNL Renal: Scr 1.23 (up), BUN 35 (up)  Hepatic: LFTs okay, TG 207, tbili 1.2 (no jaundice), albumin  <1.5 I/O: UOP 0.5 ml/kg/hr, drain 35 mL, LBM 9/18, NGT output 0 mL GI Imaging:  8/22 CT: SBO from mass 8/25 AXR: duodenal stricture suspicious for mass & malignancy 8/28 AXR: Nonobstructive bowel gas pattern.  GI Surgeries / Procedures:  8/23 SB enteroscopy: esophagitis; invasive adenocarcinoma 9/9 Whipple procedure  Central access: PICC placed 07/08/24 TPN start date: 07/08/2024  Nutritional Goals:   TPN at 75 ml/hr provides 95g AA and 1717 kCal per day  RD Estimated Needs Total Energy Estimated Needs: 1700-1900 Total Protein Estimated Needs: 75-95 grams Total Fluid Estimated Needs: >/= 1.9 L   Current Nutrition:  TPN Osmolite 1.5 via J-tube @ goal- 65 ml/hr  Plan:  Continue TPN at half rate 40 ml/hr to meet 53% of needs per surgery  Electrolytes in TPN: Na 19mEq/L, K 58mEq/L, Ca 10 mEq/L, Mg 10 mEq/L, Phos 30 mmol/L, max acetate adjusted for rate  Continue standard MVI and trace elements to TPN Continue resistant SSI Q4H and continue 45 units regular insulin  in TPN  Increase to 5u q4h for tube feeding coverage Monitor TPN labs on Mon/Thurs Follow up stopping TPN with surgery once TF at goal   Vermell Mccallum, PharmD CCM Pharmacy  Resident  08/01/2024 9:34 AM

## 2024-08-01 NOTE — Progress Notes (Signed)
 10 Days Post-Op   Subjective/Chief Complaint: Patient had a small episode of emesis last night, but was able to tolerate clears.  This was small-volume.  Patient reports feeling a lot better and reports having improved breathing.  Patient has been able to walk around in the room with physical therapy but continues to feel weak.  For unclear reasons, the tube feeds are still at 40 mL/h and not at goal.   Objective: Vital signs in last 24 hours: Temp:  [97.3 F (36.3 C)-100.2 F (37.9 C)] 98.2 F (36.8 C) (09/19 0800) Pulse Rate:  [84-114] 98 (09/19 0800) Resp:  [13-37] 23 (09/19 0800) BP: (104-184)/(48-75) 145/60 (09/19 0800) SpO2:  [88 %-97 %] 95 % (09/19 0800) Last BM Date : 07/31/24  Intake/Output from previous day: 09/18 0701 - 09/19 0700 In: 2763.6 [I.V.:959.4; NG/GT:960; IV Piggyback:734.2] Out: 1385 [Urine:1350; Drains:35] Intake/Output this shift: Total I/O In: 89.9 [I.V.:49.9; NG/GT:40] Out: -   General: Looks much more comfortable Resp: No respiratory distress with normal rate CV: Regular rate and rhythm Abd:  soft, midline wound without any erythema or drainage. Some leakage around J tube and some leakage around right blake drains.  Drains purulent Ext:  SCDs in place  Lab Results:  Recent Labs    07/31/24 1849 08/01/24 0414  WBC 26.6* 24.0*  HGB 7.4* 6.6*  HCT 22.8* 20.8*  PLT 518* 513*    BMET Recent Labs    07/31/24 0609 08/01/24 0548  NA 140 143  K 4.0 4.0  CL 100 106  CO2 26 27  GLUCOSE 216* 170*  BUN 28* 35*  CREATININE 0.95 1.23*  CALCIUM  7.7* 7.6*   PT/INR Recent Labs    07/31/24 1137  LABPROT 15.7*  INR 1.2    ABG No results for input(s): PHART, HCO3 in the last 72 hours.  Invalid input(s): PCO2, PO2   Studies/Results: CT CHEST ABDOMEN PELVIS W CONTRAST Result Date: 07/30/2024 CLINICAL DATA:  Sepsis. EXAM: CT CHEST, ABDOMEN, AND PELVIS WITH CONTRAST TECHNIQUE: Multidetector CT imaging of the chest, abdomen and  pelvis was performed following the standard protocol during bolus administration of intravenous contrast. RADIATION DOSE REDUCTION: This exam was performed according to the departmental dose-optimization program which includes automated exposure control, adjustment of the mA and/or kV according to patient size and/or use of iterative reconstruction technique. CONTRAST:  75mL OMNIPAQUE  IOHEXOL  350 MG/ML SOLN COMPARISON:  July 04, 2024. FINDINGS: CT CHEST FINDINGS Cardiovascular: No significant vascular findings. Normal heart size. No pericardial effusion. Mediastinum/Nodes: No enlarged mediastinal, hilar, or axillary lymph nodes. Thyroid gland, trachea, and esophagus demonstrate no significant findings. Lungs/Pleura: Small to moderate size bilateral pleural effusions are noted with adjacent subsegmental atelectasis. No pneumothorax is noted. Patchy airspace opacities are noted anteriorly in left upper lobe concerning for multifocal pneumonia. Musculoskeletal: No chest wall mass or suspicious bone lesions identified. CT ABDOMEN PELVIS FINDINGS Hepatobiliary: Gallbladder is not visualized and presumably has been removed. No abnormal bowel dilatation. Left hepatic cyst is again noted and unchanged. Pancreas: Large amount of inflammatory stranding and fluid is seen in the region of the pancreatic head and body consistent with history of recent Whipple's procedure. Pancreatic tail is unremarkable. Two surgical drains are seen entering right upper quadrant with tips in or near surgical bed. Spleen: Normal in size without focal abnormality. Adrenals/Urinary Tract: Adrenal glands and kidneys appear normal. No hydronephrosis or renal obstruction is noted. Urinary bladder is decompressed. Stomach/Bowel: Nasogastric tube tip is seen within the gastric lumen. There is no evidence  of bowel obstruction or inflammation. The appendix is not visualized. Jejunostomy tube is seen entering left lower quadrant. Vascular/Lymphatic:  Aortic atherosclerosis. No enlarged abdominal or pelvic lymph nodes. Reproductive: Status post hysterectomy. No adnexal masses. Other: No ascites or hernia is noted. Musculoskeletal: No acute or significant osseous findings. IMPRESSION: 1. Small to moderate size bilateral pleural effusions are noted with adjacent subsegmental atelectasis. 2. Patchy airspace opacities are noted anteriorly in left upper lobe concerning for multifocal pneumonia. 3. Large amount of inflammatory stranding and fluid is seen in the region of the pancreatic head and body consistent with history of recent Whipple's procedure. Two surgical drains are seen entering right upper quadrant with tips in or near surgical bed. 4. Aortic atherosclerosis. Aortic Atherosclerosis (ICD10-I70.0). Electronically Signed   By: Lynwood Landy Raddle M.D.   On: 07/30/2024 12:46   DG CHEST PORT 1 VIEW Result Date: 07/30/2024 EXAM: 1 VIEW XRAY OF THE CHEST 07/30/2024 11:05:00 AM COMPARISON: 07/27/2024 CLINICAL HISTORY: Hypoxia 200808. Reason for exam: hypoxia FINDINGS: LINES, TUBES AND DEVICES: Enteric tube in place with tip and side port overlying the stomach. Right PICC in place with tip overlying the expected region of the distal superior vena cava. LUNGS AND PLEURA: Low lung volumes. Bibasilar atelectasis. Mild diffuse interstitial prominence. Slightly increased right mid and lower lung opacities. HEART AND MEDIASTINUM: No acute abnormality of the cardiac and mediastinal silhouettes. BONES AND SOFT TISSUES: No acute osseous abnormality. IMPRESSION: 1. Low lung volumes with bibasilar atelectasis and mild diffuse interstitial prominence. 2. Slightly increased right mid and lower lung opacities. Electronically signed by: Donnice Mania MD 07/30/2024 12:42 PM EDT RP Workstation: [id number]      Anti-infectives: Anti-infectives (From admission, onward)    Start     Dose/Rate Route Frequency Ordered Stop   07/30/24 2300  vancomycin  (VANCOCIN ) IVPB 1000 mg/200  mL premix        1,000 mg 200 mL/hr over 60 Minutes Intravenous Every 12 hours 07/30/24 1029     07/30/24 1400  piperacillin -tazobactam (ZOSYN ) IVPB 3.375 g        3.375 g 12.5 mL/hr over 240 Minutes Intravenous Every 8 hours 07/30/24 1225     07/30/24 1115  fluconazole  (DIFLUCAN ) IVPB 400 mg        400 mg 100 mL/hr over 120 Minutes Intravenous Every 24 hours 07/30/24 1025     07/30/24 1100  vancomycin  (VANCOREADY) IVPB 2000 mg/400 mL        2,000 mg 200 mL/hr over 120 Minutes Intravenous  Once 07/30/24 1010 07/30/24 1540   07/28/24 0945  piperacillin -tazobactam (ZOSYN ) IVPB 3.375 g  Status:  Discontinued        3.375 g 12.5 mL/hr over 240 Minutes Intravenous Every 8 hours 07/28/24 0853 07/30/24 1131   07/23/24 0000  ceFAZolin  (ANCEF ) IVPB 2g/100 mL premix        2 g 200 mL/hr over 30 Minutes Intravenous Every 8 hours 07/22/24 2314 07/23/24 0058   07/22/24 0900  ceFAZolin  (ANCEF ) IVPB 2g/100 mL premix        2 g 200 mL/hr over 30 Minutes Intravenous To ShortStay Surgical 07/21/24 1938 07/22/24 1432       Assessment/Plan: s/p Procedure(s): WHIPPLE PROCEDURE (N/A) CREATION, JEJUNOSTOMY (N/A)- Teon [place] POD 10- Obstructing 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  pT3bN1 final path.    Prn morphine , oxy, robaxin , tylenol  T bili was down to normal, but is back up to 1.9. AKI, creatinine up again today ABL anemia, anemia chronic disease.  Received  a unit of packed cells for hemoglobin of 6.6 this morning  Zosyn /fluc/vanc for panc leak and pneumonia.   Leave out NGT, sips of clears OK.  Would not advance for a while.  Ok for patient to have reglan if Qtc can tolerate that.   Advance tube feeds to goal.  Continue half dose TPN.  If tolerates advancement of tube feeds to goal overnight could DC TPN tomorrow.  Potentially okay for transfer to progressive if okay with medical team  Defer management of blood sugar/HTN to medicine.       LOS: 28 days    Jina LITTIE Nephew, MD, FACS, FSSO Surgical Oncology, General Surgery, Trauma and Critical [facility], [place] [phone] for weekday/non holidays Check [email or web address] for coverage night/weekend/holidays

## 2024-08-01 NOTE — Progress Notes (Addendum)
 Pharmacy Antibiotic Note  Lauren Gray is a 73 y.o. female admitted on 07/04/2024 with pancreatic leak and PNA.  Pharmacy has been consulted for vancomycin  dosing.  Scr up to 1.23 this a.m. UOP down past 24 hours.  Plan: 9/19 PM update Vanco level checked for concern of accumulation, level approximately 14 hours after start of infusion of 13. Patient in therapeutic range, will continue Vancomycin  1g Q12h dosing. Zosyn  3.375 g IV q8h Fluconazole  400mg  IV q 24h F/u renal function, micro data, and pt's clinical condition   Height: 5' 8 (172.7 cm) Weight: 122 kg (268 lb 15.4 oz) IBW/kg (Calculated) : 63.9  Temp (24hrs), Avg:98.7 F (37.1 C), Min:97.3 F (36.3 C), Max:100.2 F (37.9 C)  Recent Labs  Lab 07/29/24 0439 07/30/24 0540 07/31/24 0609 07/31/24 1137 07/31/24 1849 08/01/24 0414 08/01/24 0548 08/01/24 1046  WBC  --  24.6* 27.0* 25.9* 26.6* 24.0*  --  22.6*  CREATININE 1.00 0.92  0.91 0.95  --   --   --  1.23* 1.08*    Estimated Creatinine Clearance: 63.8 mL/min (A) (by C-G formula based on SCr of 1.08 mg/dL (H)).    Allergies  Allergen Reactions   Codeine Anxiety    i don't feel right at all   Latex Rash    Antimicrobials this admission: Zosyn  9/15>  Vancomycin  9/17> Fluconazole  9/17>  Dose adjustments this admission:   Microbiology results: 9/9 MRSA neg  Thank you for allowing pharmacy to be a part of this patient's care.  Vito Ralph, PharmD, BCPS Please see [email or web address] for complete clinical pharmacist phone list 08/01/2024 3:18 PM  Lauren Gray, PharmD Clinical Pharmacist 08/01/2024  4:44 PM **Pharmacist phone directory can now be found on [email or web address] ([email or web address]).  Listed under [facility].

## 2024-08-01 NOTE — Progress Notes (Signed)
 [facility] Progress Note Patient: Lauren Gray FMW:[id number] DOB: 06-Jan-1951  DOA: 07/04/2024 DOS: the patient was seen and examined on 08/01/2024  Brief Hospital Course: 73 yo female presented 07/05/2023 due to gastric outlet obstruction with findings of underlying duodenal cancer.  The patient underwent a Whipple procedure on 07/22/2024.   Assessment and Plan: Duodenal adenocarcinoma with gastric outlet obstruction Status post Whipple's procedure and jejunostomy creation. CT scan showed dilated 1st and 2nd portion of duodenum with possible underlying mass. Patient was seen by gastroenterology and underwent small bowel enteroscopy.  The duodenal mass was biopsied.  Pathology was positive for adenocarcinoma. General surgery was consulted .  Patient has poor nutritional status.  Due to gastric outlet obstruction from the duodenal mass patient   started on TPN.   Due to persistent nausea and vomiting NG tube was inserted.   Surgery (Whipple procedure) was performed on 07/22/2024.  Was on Dilaudid  PCA patient family did not want IV Dilaudid ; want to avoid opioids as it caused confusion CT abdomen and chest on 9/17 shows no new changes. Due to worsening leukocytosis antibiotic regimen-broaden. Management per surgery.  Bilateral pleural effusion. Multifocal pneumonia Reports of shortness of breath. CT chest negative for PE although altered study was not designed for PE. CT chest shows evidence of bilateral pleural effusion likely third spacing in the setting of hypoalbuminemia. Repeat chest x-ray shows persistent pneumonia.  Monitor pleural effusion.  Anemia Normocytic.   Hemoglobin drifting down towards 7. Transfuse hemoglobin less than 7 or hemodynamic instability. Iron level normal.  Folic acid within range.  B12 normal.  Smear does not show any evidence of exercise.  Reticulocyte count elevated but CT abdomen negative for any acute blood loss.  Received PRBC transfusion on  9/19. Repeat hemoglobin was actually adequately elevated. If the hemoglobin continues to drift lower she may need thoracentesis to ensure that the pleural effusion that is seen is not related to hemothorax.   Nutrition Back on TPN due to concern for intra-abdominal pathology causing leukocytosis. Also on tube feeds via jejunostomy tube.  G-tube on hold.   Hypokalemia/hypophosphatemia Resolved.    Essential hypertension Blood pressure is elevated,  Continue current medications.   Hyperglycemia Continue sliding scale insulin  only for now   AKI on CKDII  Worsening.. Continue with IV fluid.   Mild elevated lipase Resolved  Anxiety and insomnia. Trazodone  at nighttime and Ativan  as needed.  Obesity Class 3 Body mass index is 40.9 kg/m.  Placing the pt at higher risk of poor outcomes.    Subjective: Feeling better breathing better.  No nausea no vomiting.  Still has cough.  Had a BM without any blood.  Physical Exam: Basal crackles. S1-S2 present Bowel sound present No edema.  Data Reviewed: I have Reviewed nursing notes, Vitals, and Lab results. Since last encounter, pertinent lab results CBC and BMP   . I have ordered test including CBC and BMP  .   Disposition: Status is: Inpatient Remains inpatient appropriate because: Monitor for improvement in abdominal pain and hemoglobin and renal function  SCD's Start: 07/22/24 2315 SCDs Start: 07/04/24 2321   Family Communication: Family at bedside Level of care: Progressive   Vitals:   08/01/24 1400 08/01/24 1500 08/01/24 1600 08/01/24 1700  BP: (!) 148/56 (!) 142/61 (!) 148/63 (!) 151/64  Pulse: 96 93 95 93  Resp: (!) 32 18 (!) 27 (!) 31  Temp:   99.1 F (37.3 C)   TempSrc:   Axillary   SpO2: 92% 96%  93% 93%  Weight:      Height:         Author: Yetta Blanch, MD 08/01/2024 6:27 PM  Please look on [email or web address] to find out who is on call.

## 2024-08-01 NOTE — Progress Notes (Addendum)
  Inpatient Rehabilitation Admissions Coordinator   Met with patient and spouse at bedside for rehab assessment. Also contacted their daughter by phone per their request. We discussed goals and expectations of a possible CIR admit if needed when medically ready. Spouse is retired and patient is on [employer] from [employer]. Daughter lives in the home but works during the day. We will follow up next week with her progress. Please call me with any questions.   Heron Leavell, RN, MSN Rehab Admissions Coordinator [phone]

## 2024-08-01 NOTE — Progress Notes (Signed)
 Occupational Therapy Treatment Patient Details Name: Lauren Gray MRN: [id number] DOB: 03/09/1951 Today's Date: 08/01/2024   History of present illness Pt is a 73 y/o female presenting with 1 month of nausea/vomiting. CT abdomen showed SBO from underlying mass. Mass biopsy showed + invasive adenocarcinoma. Requiring TPN. S/p pancreaticoduodenectomy, pancreatic duct stent placement, J-tube placement 9/9. PMH: hypokalemia, HTN, CKD, prediabetes   OT comments  Pt making gradual progress towards goals though continues to be limited by abdominal pain. Pt received in chair, reports goal of progressing OOB tolerance in the coming days. Pt requires Mod A x 2 to stand with RW and once up, able to progress pivoting to Min A x 2 for safety using RW. Pt reports dizziness with activity d/t pain. Husband at bedside, supportive. Continue to recommend intensive therapy > 3 hours per day to return to independence w/ pt/family in agreement with this plan.       If plan is discharge home, recommend the following:  A lot of help with bathing/dressing/bathroom;Assistance with cooking/housework;Direct supervision/assist for medications management;Direct supervision/assist for financial management;Assist for transportation;Help with stairs or ramp for entrance;A lot of help with walking and/or transfers   Equipment Recommendations  BSC/3in1;Tub/shower seat    Recommendations for Other Services      Precautions / Restrictions Precautions Precautions: Fall;Other (comment) Precaution/Restrictions Comments: JP drainx2 (L) Restrictions Weight Bearing Restrictions Per Provider Order: No       Mobility Bed Mobility Overal bed mobility: Needs Assistance Bed Mobility: Sit to Supine       Sit to supine: Mod assist   General bed mobility comments: Assist with trunk guidance and LE back to bed. Pt opted for helicopter method rather than log rolling    Transfers Overall transfer level: Needs  assistance Equipment used: Rolling walker (2 wheels) Transfers: Sit to/from Stand, Bed to chair/wheelchair/BSC Sit to Stand: Mod assist, +2 physical assistance     Step pivot transfers: Min assist, +2 safety/equipment     General transfer comment: Mod A x 2 to power up, cues for hand placement and posture. Once up, able to step back to bed with less assistance     Balance Overall balance assessment: Needs assistance Sitting-balance support: Feet supported, Single extremity supported Sitting balance-Leahy Scale: Fair     Standing balance support: During functional activity, Bilateral upper extremity supported, Reliant on assistive device for balance Standing balance-Leahy Scale: Poor                             ADL either performed or assessed with clinical judgement   ADL Overall ADL's : Needs assistance/impaired                                            Extremity/Trunk Assessment Upper Extremity Assessment Upper Extremity Assessment: Generalized weakness;Right hand dominant   Lower Extremity Assessment Lower Extremity Assessment: Defer to PT evaluation        Vision   Vision Assessment?: No apparent visual deficits   Perception     Praxis     Communication Communication Communication: No apparent difficulties   Cognition Arousal: Alert Behavior During Therapy: Flat affect, WFL for tasks assessed/performed Cognition: No apparent impairments  Following commands: Intact        Cueing   Cueing Techniques: Verbal cues  Exercises      Shoulder Instructions       General Comments Spouse at bedside    Pertinent Vitals/ Pain       Pain Assessment Pain Assessment: Faces Faces Pain Scale: Hurts even more Pain Location: Abdomen with activity Pain Descriptors / Indicators: Grimacing, Aching Pain Intervention(s): Monitored during session, Limited activity within patient's tolerance,  Repositioned  Home Living                                          Prior Functioning/Environment              Frequency  Min 2X/week        Progress Toward Goals  OT Goals(current goals can now be found in the care plan section)  Progress towards OT goals: Progressing toward goals  Acute Rehab OT Goals Patient Stated Goal: pain control, get through this and get better OT Goal Formulation: With patient/family Time For Goal Achievement: 08/12/24 Potential to Achieve Goals: Good ADL Goals Pt Will Perform Grooming: with set-up;standing;sitting Pt Will Perform Lower Body Bathing: with modified independence;sitting/lateral leans;sit to/from stand Pt Will Perform Upper Body Dressing: with set-up;sitting;standing Pt Will Perform Lower Body Dressing: with set-up;sitting/lateral leans;sit to/from stand Pt Will Transfer to Toilet: with set-up;ambulating;regular height toilet Additional ADL Goal #1: Pt to verbalize at least 3 energy conservation strategies to implement at home  Plan      Co-evaluation                 AM-PAC OT 6 Clicks Daily Activity     Outcome Measure   Help from another person eating meals?: A Little Help from another person taking care of personal grooming?: A Little Help from another person toileting, which includes using toliet, bedpan, or urinal?: A Lot Help from another person bathing (including washing, rinsing, drying)?: A Lot Help from another person to put on and taking off regular upper body clothing?: A Lot Help from another person to put on and taking off regular lower body clothing?: A Lot 6 Click Score: 14    End of Session Equipment Utilized During Treatment: Rolling walker (2 wheels);Gait belt;Oxygen  OT Visit Diagnosis: Unsteadiness on feet (R26.81);Other abnormalities of gait and mobility (R26.89);Muscle weakness (generalized) (M62.81)   Activity Tolerance Patient tolerated treatment well;Patient limited by  pain   Patient Left in bed;with call bell/phone within reach;with family/visitor present   Nurse Communication Mobility status        Time: 8668-8643 OT Time Calculation (min): 25 min  Charges: OT General Charges $OT Visit: 1 Visit OT Treatments $Therapeutic Activity: 8-22 mins  Mliss NOVAK, OTR/L Acute Rehab Services Office: [phone]   Mliss Fish 08/01/2024, 2:09 PM

## 2024-08-01 NOTE — TOC Progression Note (Signed)
 Transition of Care [facility]) - Progression Note    Patient Details  Name: Lauren Gray MRN: [id number] Date of Birth: Jan 10, 1951  Transition of Care [facility] CM/SW Contact  Inocente GORMAN Kindle, LCSW Phone Number: 08/01/2024, 5:47 PM  Clinical Narrative:    CSW continuing to follow for CIR determination of candidacy.   Expected Discharge Plan: IP Rehab Facility Barriers to Discharge: English as a second language teacher, Continued Medical Work up               Expected Discharge Plan and Services       Living arrangements for the past 2 months: Single Family Home                                       Social Drivers of Health (SDOH) Interventions SDOH Screenings   Food Insecurity: No Food Insecurity (07/20/2024)  Housing: Unknown (07/20/2024)  Transportation Needs: No Transportation Needs (07/20/2024)  Utilities: Not At Risk (07/20/2024)  Depression (PHQ2-9): Low Risk  (06/18/2024)  Social Connections: Socially Integrated (07/20/2024)  Tobacco Use: Low Risk  (07/22/2024)    Readmission Risk Interventions    07/07/2024   12:30 PM  Readmission Risk Prevention Plan  Post Dischage Appt Complete  Medication Screening Complete  Transportation Screening Complete

## 2024-08-01 NOTE — Progress Notes (Signed)
 Patient slept well overnight, no complaints of pain or anxiety. Vomited once after meds given through J tube, dark green/brown in color. PRN zofran  given and was effective. Pt required PRN hydralazine  x1 and PRN labetalol  x1 for SBP >160. J tube and JP sites x2 all leaking at sites, dressings changed at 2000. Both JP drains to gravity (uncharged) per Dasie MD, see note. Hgb 6.6, 1u PRBC ordered and transfusing now. No visible signs of bleeding anywhere. 1 large type 6/7 tan BM.

## 2024-08-02 ENCOUNTER — Inpatient Hospital Stay (HOSPITAL_COMMUNITY)

## 2024-08-02 DIAGNOSIS — K56609 Unspecified intestinal obstruction, unspecified as to partial versus complete obstruction: Secondary | ICD-10-CM | POA: Diagnosis not present

## 2024-08-02 LAB — BPAM RBC
Blood Product Expiration Date: 202509302359
Blood Product Expiration Date: 202510122359
Blood Product Expiration Date: 202510122359
ISSUE DATE / TIME: 202509190522
ISSUE DATE / TIME: 202509192050
ISSUE DATE / TIME: 202509192345
Unit Type and Rh: 1700
Unit Type and Rh: 7300
Unit Type and Rh: 7300

## 2024-08-02 LAB — AMYLASE, PLEURAL OR PERITONEAL FLUID: Amylase, Fluid: 16 U/L

## 2024-08-02 LAB — GLUCOSE, CAPILLARY
Glucose-Capillary: 189 mg/dL — ABNORMAL HIGH (ref 70–99)
Glucose-Capillary: 278 mg/dL — ABNORMAL HIGH (ref 70–99)
Glucose-Capillary: 228 mg/dL — ABNORMAL HIGH (ref 70–99)
Glucose-Capillary: 169 mg/dL — ABNORMAL HIGH (ref 70–99)
Glucose-Capillary: 172 mg/dL — ABNORMAL HIGH (ref 70–99)

## 2024-08-02 LAB — COMPREHENSIVE METABOLIC PANEL WITH GFR
ALT: 56 U/L — ABNORMAL HIGH (ref 0–44)
AST: 36 U/L (ref 15–41)
Albumin: <1.5 g/dL — ABNORMAL LOW (ref 3.5–5.0)
Alkaline Phosphatase: 137 U/L — ABNORMAL HIGH (ref 38–126)
Anion gap: 11 (ref 5–15)
BUN: 33 mg/dL — ABNORMAL HIGH (ref 8–23)
CO2: 28 mmol/L (ref 22–32)
Calcium: 7.6 mg/dL — ABNORMAL LOW (ref 8.9–10.3)
Chloride: 106 mmol/L (ref 98–111)
Creatinine, Ser: 1.12 mg/dL — ABNORMAL HIGH (ref 0.44–1.00)
GFR, Estimated: 52 mL/min — ABNORMAL LOW (ref >60)
Glucose, Bld: 219 mg/dL — ABNORMAL HIGH (ref 70–99)
Potassium: 4.1 mmol/L (ref 3.5–5.1)
Sodium: 145 mmol/L (ref 135–145)
Total Bilirubin: 1.5 mg/dL — ABNORMAL HIGH (ref 0.0–1.2)
Total Protein: 5.8 g/dL — ABNORMAL LOW (ref 6.5–8.1)

## 2024-08-02 LAB — TYPE AND SCREEN
ABO/RH(D): B POS
Antibody Screen: NEGATIVE
Unit division: 0
Unit division: 0
Unit division: 0

## 2024-08-02 LAB — CBC
HCT: 28.3 % — ABNORMAL LOW (ref 36.0–46.0)
HCT: 28.5 % — ABNORMAL LOW (ref 36.0–46.0)
Hemoglobin: 9.4 g/dL — ABNORMAL LOW (ref 12.0–15.0)
Hemoglobin: 9.2 g/dL — ABNORMAL LOW (ref 12.0–15.0)
MCH: 29.2 pg (ref 26.0–34.0)
MCH: 29.2 pg (ref 26.0–34.0)
MCHC: 33.2 g/dL (ref 30.0–36.0)
MCHC: 32.3 g/dL (ref 30.0–36.0)
MCV: 87.9 fL (ref 80.0–100.0)
MCV: 90.5 fL (ref 80.0–100.0)
Platelets: 538 K/uL — ABNORMAL HIGH (ref 150–400)
Platelets: 521 K/uL — ABNORMAL HIGH (ref 150–400)
RBC: 3.22 MIL/uL — ABNORMAL LOW (ref 3.87–5.11)
RBC: 3.15 MIL/uL — ABNORMAL LOW (ref 3.87–5.11)
RDW: 17.9 % — ABNORMAL HIGH (ref 11.5–15.5)
RDW: 18.3 % — ABNORMAL HIGH (ref 11.5–15.5)
WBC: 19.4 K/uL — ABNORMAL HIGH (ref 4.0–10.5)
WBC: 15.4 K/uL — ABNORMAL HIGH (ref 4.0–10.5)
nRBC: 9.7 % — ABNORMAL HIGH (ref 0.0–0.2)
nRBC: 10.1 % — ABNORMAL HIGH (ref 0.0–0.2)

## 2024-08-02 LAB — BODY FLUID CELL COUNT WITH DIFFERENTIAL
Eos, Fluid: 0 %
Lymphs, Fluid: 22 %
Monocyte-Macrophage-Serous Fluid: 51 % (ref 50–90)
Neutrophil Count, Fluid: 27 % — ABNORMAL HIGH (ref 0–25)
Total Nucleated Cell Count, Fluid: 5610 uL — ABNORMAL HIGH (ref 0–1000)

## 2024-08-02 LAB — PROTEIN, PLEURAL OR PERITONEAL FLUID: Total protein, fluid: <3 g/dL

## 2024-08-02 LAB — LACTATE DEHYDROGENASE, PLEURAL OR PERITONEAL FLUID: LD, Fluid: 217 U/L — ABNORMAL HIGH (ref 3–23)

## 2024-08-02 MED ORDER — LIDOCAINE HCL (PF) 1 % IJ SOLN
10.0000 mL | Freq: Once | INTRAMUSCULAR | Status: AC
  Administered 2024-08-02: 10 mL

## 2024-08-02 MED ORDER — BANATROL TF EN LIQD
60.0000 mL | Freq: Two times a day (BID) | ENTERAL | Status: DC
  Administered 2024-08-02 – 2024-08-15 (×27): 60 mL
  Filled 2024-08-02 (×27): qty 60

## 2024-08-02 NOTE — Progress Notes (Signed)
 [facility] Progress Note Patient: Lauren Gray FMW:[id number] DOB: 02-Aug-1951  DOA: 07/04/2024 DOS: the patient was seen and examined on 08/02/2024  Brief Hospital Course: 73 yo female presented 07/05/2023 due to gastric outlet obstruction with findings of underlying duodenal cancer.  The patient underwent a Whipple procedure on 07/22/2024.   Assessment and Plan: Duodenal adenocarcinoma with gastric outlet obstruction Status post Whipple's procedure and jejunostomy creation. CT scan showed dilated 1st and 2nd portion of duodenum with possible underlying mass. GI was consulted, 8/24 underwent small bowel enteroscopy. Duodenal mass biopsy was positive for adenocarcinoma. General surgery was consulted. Due to poor nutritional status from the mass patient was started on TPN first. Surgery (Whipple procedure) was performed on 07/22/2024.  Was on Dilaudid  PCA CT abdomen and chest on 9/17 shows no new changes. Due to worsening leukocytosis antibiotic regimen-broaden. Management per surgery.  Bilateral pleural effusion. Multifocal pneumonia Reports of shortness of breath. CT chest negative for PE although altered study was not designed for PE. CT chest shows evidence of bilateral pleural effusion likely third spacing in the setting of hypoalbuminemia. Repeat chest x-ray shows persistent pneumonia. Underwent thoracentesis.  250 mL fluid.  No evidence of hemothorax.  Appears to be exudative fluid based on LDH.  Normocytic anemia postop blood loss anemia with a combination of dilution from IV fluids Transfuse hemoglobin less than 7 or hemodynamic instability.  Iron level normal.  Folic acid within range.  B12 normal.  Smear does not show any evidence of exercise.  Reticulocyte count elevated but CT abdomen negative for any acute blood loss.  No hemothorax on thoracentesis. 1 PRBC transfusion on 9/19 followed by 2 more PRBC transfusion. May require hematology consultation if hemoglobin  continues to drift down.   Nutrition Currently at goal rate for tube feeds. TPN will be discontinued 9/20. Can only have sips orally.   Hypokalemia/hypophosphatemia Resolved.    Essential hypertension Blood pressure is elevated,  Continue current medications.   Hyperglycemia Continue sliding scale insulin  only for now   AKI on CKD 2 Continue with IV fluid. Baseline creatinine normal.  On admission serum creatinine was 1.35.  Creatinine fluctuating.  Monitor.   Mild elevated lipase Resolved  Anxiety and insomnia. Trazodone  at nighttime and Ativan  as needed.  Obesity Class 3 Body mass index is 41.23 kg/m.  Placing the pt at higher risk of poor outcomes.    Subjective: Abdominal pain resolved.  No nausea no vomiting.  Breathing still heavy.  No chest pain.  Physical Exam: Basal crackles. S1-S2 present. Bowel sound present but sluggish. Trace edema.  Data Reviewed: I have Reviewed nursing notes, Vitals, and Lab results. Since last encounter, pertinent lab results CBC and BMP   . I have ordered test including CBC and BMP  .   Disposition: Status is: Inpatient Remains inpatient appropriate because: Monitor for improvement in postop recovery  SCD's Start: 07/22/24 2315 SCDs Start: 07/04/24 2321   Family Communication: Husband at bedside Level of care: Progressive   Vitals:   08/02/24 0600 08/02/24 0757 08/02/24 1205 08/02/24 1519  BP:  (!) 158/72 (!) 175/65 (!) 164/70  Pulse:  95 92 93  Resp:  20 (!) 30 20  Temp:  97.8 F (36.6 C) (!) 97.4 F (36.3 C) 97.8 F (36.6 C)  TempSrc:  Oral Oral Oral  SpO2:  96% 96% 96%  Weight: 123 kg     Height:         Author: Yetta Blanch, MD 08/02/2024 4:49 PM  Please  look on [email or web address] to find out who is on call.

## 2024-08-02 NOTE — Progress Notes (Addendum)
 11 Days Post-Op   Subjective/Chief Complaint: No acute events overnight. Took in a couple sips of clears yesterday without vomiting. Having loose stools.  TF up to 65 mL/hr   Objective: Vital signs in last 24 hours: Temp:  [97.8 F (36.6 C)-99.6 F (37.6 C)] 97.8 F (36.6 C) (09/20 0757) Pulse Rate:  [92-102] 95 (09/20 0757) Resp:  [17-32] 20 (09/20 0757) BP: (107-158)/(51-89) 158/72 (09/20 0757) SpO2:  [92 %-96 %] 96 % (09/20 0757) Weight:  [876 kg] 123 kg (09/20 0600) Last BM Date : 08/01/24  Intake/Output from previous day: 09/19 0701 - 09/20 0700 In: 4546.8 [P.O.:40; I.V.:1142; Blood:1623.8; NG/GT:1338.8; IV Piggyback:402.2] Out: 1415 [Urine:950; Drains:465] Intake/Output this shift: No intake/output data recorded.  General: no acute distress Resp: No respiratory distress with normal rate CV: Regular rate and rhythm Abd:  soft, midline wound without any erythema or drainage. Some leakage around J tube and some leakage around right blake drains.  Drains purulent/cloudy (465 mL documented) Ext:  SCDs in place  Lab Results:  Recent Labs    08/01/24 1736 08/02/24 0520  WBC 18.6* 19.4*  HGB 6.4* 9.4*  HCT 19.0* 28.3*  PLT 453* 538*    BMET Recent Labs    08/01/24 2000 08/02/24 0520  NA 145 145  K 4.0 4.1  CL 107 106  CO2 28 28  GLUCOSE 265* 219*  BUN 36* 33*  CREATININE 1.20* 1.12*  CALCIUM  7.5* 7.6*   PT/INR Recent Labs    07/31/24 1137  LABPROT 15.7*  INR 1.2    ABG No results for input(s): PHART, HCO3 in the last 72 hours.  Invalid input(s): PCO2, PO2   Studies/Results: DG CHEST PORT 1 VIEW Result Date: 08/01/2024 EXAM: 1 VIEW XRAY OF THE CHEST 08/01/2024 05:48:33 AM COMPARISON: 07/30/2024 radiograph and CT CLINICAL HISTORY: Pneumonia FINDINGS: LINES, TUBES AND DEVICES: Stable right PICC line. Enteric tube removed. LUNGS AND PLEURA: Low lung volumes. Increased left basilar opacity. Unchanged right mid lung opacity. Decreased right  pleural effusion. Left pleural effusion on CT is not well demonstrated by radiograph. HEART AND MEDIASTINUM: Unchanged heart size and mediastinal contours. BONES AND SOFT TISSUES: No acute osseous abnormality. Midline abdominal skin staples. IMPRESSION: 1. Increased left basilar opacity. Left pleural effusion on CT is not well demonstrated by radiograph. 2. Unchanged right mid lung opacity. Decreased right pleural effusion. Electronically signed by: Andrea Gasman MD 08/01/2024 01:24 PM EDT RP Workstation: [id number]      Anti-infectives: Anti-infectives (From admission, onward)    Start     Dose/Rate Route Frequency Ordered Stop   08/02/24 1400  piperacillin -tazobactam (ZOSYN ) IVPB 3.375 g        3.375 g 12.5 mL/hr over 240 Minutes Intravenous Every 8 hours 08/01/24 2238     08/01/24 2330  cefTRIAXone  (ROCEPHIN ) injection 1 g       Placed in And Linked Group   1 g Intramuscular  Once 08/01/24 2238 08/02/24 0015   08/01/24 1800  vancomycin  (VANCOCIN ) IVPB 1000 mg/200 mL premix        1,000 mg 200 mL/hr over 60 Minutes Intravenous Every 12 hours 08/01/24 1642     08/01/24 1047  vancomycin  variable dose per unstable renal function (pharmacist dosing)  Status:  Discontinued         Does not apply See admin instructions 08/01/24 1047 08/01/24 1646   07/30/24 2300  vancomycin  (VANCOCIN ) IVPB 1000 mg/200 mL premix  Status:  Discontinued        1,000 mg 200 mL/hr  over 60 Minutes Intravenous Every 12 hours 07/30/24 1029 08/01/24 1047   07/30/24 1400  piperacillin -tazobactam (ZOSYN ) IVPB 3.375 g  Status:  Discontinued        3.375 g 12.5 mL/hr over 240 Minutes Intravenous Every 8 hours 07/30/24 1225 08/01/24 2238   07/30/24 1115  fluconazole  (DIFLUCAN ) IVPB 400 mg        400 mg 100 mL/hr over 120 Minutes Intravenous Every 24 hours 07/30/24 1025     07/30/24 1100  vancomycin  (VANCOREADY) IVPB 2000 mg/400 mL        2,000 mg 200 mL/hr over 120 Minutes Intravenous  Once 07/30/24 1010 07/30/24  1540   07/28/24 0945  piperacillin -tazobactam (ZOSYN ) IVPB 3.375 g  Status:  Discontinued        3.375 g 12.5 mL/hr over 240 Minutes Intravenous Every 8 hours 07/28/24 0853 07/30/24 1131   07/23/24 0000  ceFAZolin  (ANCEF ) IVPB 2g/100 mL premix        2 g 200 mL/hr over 30 Minutes Intravenous Every 8 hours 07/22/24 2314 07/23/24 0058   07/22/24 0900  ceFAZolin  (ANCEF ) IVPB 2g/100 mL premix        2 g 200 mL/hr over 30 Minutes Intravenous To ShortStay Surgical 07/21/24 1938 07/22/24 1432       Assessment/Plan: s/p Procedure(s): WHIPPLE PROCEDURE (N/A) CREATION, JEJUNOSTOMY (N/A)- byerly 07/22/24 POD 11- 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  pT3bN1 final path.   Prn morphine , oxy, robaxin , tylenol  T bili was down to normal, today it is 1.5 from 1.9 AKI, creatinine 1.12 from 1.2 ABL anemia, anemia chronic disease. 2 u PRBC on 9/9, s/p 3 u RBC 9/19 for hgb 6.4 >> 9.4 this AM, appropriate rise.  Zosyn /fluc/vanc for panc leak and pneumonia.   Leave out NGT, sips of clears OK.  Would not advance for a while.  Ok for patient to have reglan if Qtc can tolerate that.   TF now at goal. Ok to stop TPN. She is having multiple loose stools so I have ordered banatrol BID to start today.  PT/OT - she is working with therapies well, walking from bed to chair with modA +2  Defer management of blood sugar/HTN to medicine.       LOS: 29 days  Almarie Pringle, [facility] - [facility] Please see Amion for pager number during day hours 7:00am-4:30pm

## 2024-08-02 NOTE — Plan of Care (Signed)

## 2024-08-02 NOTE — Procedures (Signed)
 PROCEDURE SUMMARY:  Successful US  guided right thoracentesis. Yielded 250 mL of amber fluid. Pt tolerated procedure well. No immediate complications.  Specimen was sent for labs. CXR ordered.  EBL < 5 mL  Solmon Selmer Ku PA-C 08/02/2024 10:57 AM

## 2024-08-02 NOTE — Progress Notes (Signed)
 PHARMACY - TOTAL PARENTERAL NUTRITION CONSULT NOTE  Indication: Gastric outlet obstruction 2nd to mass   Patient Measurements: Height: 5' 8 (172.7 cm) Weight: 123 kg (271 lb 2.7 oz) IBW/kg (Calculated) : 63.9 TPN AdjBW (KG): 74.7 Body mass index is 41.23 kg/m.   Assessment:  Pharmacy consulted to manage TPN for nutrition support in [name] with gastric outlet obstruction 2nd mass.  PMH: HLD, prediabetes, CKDII does not take any medication on regular basis, past surgical h/o S/P total hysterectomy and BSO   Glucose / Insulin : hx preDM (A1c 6.1 in Aug 2025) Used 58 units rSSI since new bag was hung + 45u insulin  in TPN  Electrolytes: all WNL Renal: Scr 1.12 (down), BUN 33 (down)  Hepatic: AST/ALT trending up at 36/56, TG 207, tbili trending down 1.5 (no jaundice), albumin  <1.5 I/O: UOP 0.3 ml/kg/hr, drains 465 mL, LBM 9/19 x1   GI Imaging:  8/22 CT: SBO from mass 8/25 AXR: duodenal stricture suspicious for mass & malignancy 8/28 AXR: Nonobstructive bowel gas pattern.  GI Surgeries / Procedures:  8/23 SB enteroscopy: esophagitis; invasive adenocarcinoma 9/9 Whipple procedure  Central access: PICC placed 07/08/24 TPN start date: 07/08/2024  Nutritional Goals:   TPN at 75 ml/hr provides 95g AA and 1717 kCal per day  RD Estimated Needs Total Energy Estimated Needs: 1700-1900 Total Protein Estimated Needs: 75-95 grams Total Fluid Estimated Needs: >/= 1.9 L   Current Nutrition:  TPN Osmolite 1.5 via J-tube @ goal- 65 ml/hr  Plan:  TF at goal > stop TPN after current bag completes @ 1800 Continue 10 units + resistant SSI Q4H  Thank you for allowing pharmacy to be a part of this patient's care.  Shelba Collier, PharmD, BCPS Clinical Pharmacist

## 2024-08-03 DIAGNOSIS — K56609 Unspecified intestinal obstruction, unspecified as to partial versus complete obstruction: Secondary | ICD-10-CM | POA: Diagnosis not present

## 2024-08-03 LAB — CBC WITH DIFFERENTIAL/PLATELET
Abs Immature Granulocytes: 0.22 K/uL — ABNORMAL HIGH (ref 0.00–0.07)
Basophils Absolute: 0 K/uL (ref 0.0–0.1)
Basophils Relative: 0 %
Eosinophils Absolute: 0.2 K/uL (ref 0.0–0.5)
Eosinophils Relative: 2 %
HCT: 29.2 % — ABNORMAL LOW (ref 36.0–46.0)
Hemoglobin: 9.2 g/dL — ABNORMAL LOW (ref 12.0–15.0)
Immature Granulocytes: 2 %
Lymphocytes Relative: 13 %
Lymphs Abs: 1.6 K/uL (ref 0.7–4.0)
MCH: 28.9 pg (ref 26.0–34.0)
MCHC: 31.5 g/dL (ref 30.0–36.0)
MCV: 91.8 fL (ref 80.0–100.0)
Monocytes Absolute: 0.7 K/uL (ref 0.1–1.0)
Monocytes Relative: 6 %
Neutro Abs: 9.8 K/uL — ABNORMAL HIGH (ref 1.7–7.7)
Neutrophils Relative %: 77 %
Platelets: 543 K/uL — ABNORMAL HIGH (ref 150–400)
RBC: 3.18 MIL/uL — ABNORMAL LOW (ref 3.87–5.11)
RDW: 18.5 % — ABNORMAL HIGH (ref 11.5–15.5)
WBC: 12.7 K/uL — ABNORMAL HIGH (ref 4.0–10.5)
nRBC: 4.2 % — ABNORMAL HIGH (ref 0.0–0.2)

## 2024-08-03 LAB — BASIC METABOLIC PANEL WITH GFR
Anion gap: 10 (ref 5–15)
BUN: 25 mg/dL — ABNORMAL HIGH (ref 8–23)
CO2: 25 mmol/L (ref 22–32)
Calcium: 7.5 mg/dL — ABNORMAL LOW (ref 8.9–10.3)
Chloride: 111 mmol/L (ref 98–111)
Creatinine, Ser: 1.11 mg/dL — ABNORMAL HIGH (ref 0.44–1.00)
GFR, Estimated: 52 mL/min — ABNORMAL LOW (ref >60)
Glucose, Bld: 219 mg/dL — ABNORMAL HIGH (ref 70–99)
Potassium: 3.9 mmol/L (ref 3.5–5.1)
Sodium: 146 mmol/L — ABNORMAL HIGH (ref 135–145)

## 2024-08-03 LAB — GLUCOSE, CAPILLARY
Glucose-Capillary: 165 mg/dL — ABNORMAL HIGH (ref 70–99)
Glucose-Capillary: 165 mg/dL — ABNORMAL HIGH (ref 70–99)
Glucose-Capillary: 173 mg/dL — ABNORMAL HIGH (ref 70–99)
Glucose-Capillary: 175 mg/dL — ABNORMAL HIGH (ref 70–99)
Glucose-Capillary: 201 mg/dL — ABNORMAL HIGH (ref 70–99)
Glucose-Capillary: 205 mg/dL — ABNORMAL HIGH (ref 70–99)

## 2024-08-03 LAB — VANCOMYCIN, TROUGH: Vancomycin Tr: 17 ug/mL (ref 15–20)

## 2024-08-03 LAB — MAGNESIUM: Magnesium: 2.1 mg/dL (ref 1.7–2.4)

## 2024-08-03 MED ORDER — FREE WATER
200.0000 mL | Freq: Four times a day (QID) | Status: DC
  Administered 2024-08-03 (×2): 200 mL

## 2024-08-03 MED ORDER — GABAPENTIN 250 MG/5ML PO SOLN
100.0000 mg | Freq: Three times a day (TID) | ORAL | Status: DC
  Administered 2024-08-03 – 2024-08-15 (×34): 100 mg
  Filled 2024-08-03 (×43): qty 2

## 2024-08-03 MED ORDER — INSULIN GLARGINE-YFGN 100 UNIT/ML ~~LOC~~ SOLN
10.0000 [IU] | Freq: Every day | SUBCUTANEOUS | Status: DC
Start: 2024-08-03 — End: 2024-08-03
  Filled 2024-08-03: qty 0.1

## 2024-08-03 MED ORDER — INSULIN GLARGINE 100 UNIT/ML ~~LOC~~ SOLN
10.0000 [IU] | Freq: Every day | SUBCUTANEOUS | Status: DC
Start: 2024-08-03 — End: 2024-08-11
  Administered 2024-08-03 – 2024-08-11 (×9): 10 [IU] via SUBCUTANEOUS
  Filled 2024-08-03 (×9): qty 0.1

## 2024-08-03 MED ORDER — FREE WATER
300.0000 mL | Status: DC
  Administered 2024-08-03 – 2024-08-04 (×5): 300 mL

## 2024-08-03 NOTE — Progress Notes (Signed)
 Pharmacy Antibiotic Note  Lauren Gray is a 73 y.o. female admitted on 07/04/2024 with pancreatic leak and PNA.  Pharmacy has been consulted for vancomycin  dosing.  9/19 update Vanco level checked for concern of accumulation, level approximately 14 hours after start of infusion of 13. Patient in therapeutic range, will continue Vancomycin  1g Q12h dosing.  9/21 Renal function downtrending 1.11 today. Vanc trough measured at 17 - ~11.5 hours after infusion and ~.5hr prior to infusion. In therapeutic range - no changes necessary to vanc dosing.  Plan: Continue Vanc 1g q12 Continue Zosyn  3.375 g IV q8h Continue Fluconazole  400mg  IV q 24h  F/u renal function, micro data, and pt's clinical condition   Height: 5' 8 (172.7 cm) Weight: 123 kg (271 lb 2.7 oz) IBW/kg (Calculated) : 63.9  Temp (24hrs), Avg:98.1 F (36.7 C), Min:97.6 F (36.4 C), Max:98.5 F (36.9 C)  Recent Labs  Lab 08/01/24 0548 08/01/24 1046 08/01/24 1413 08/01/24 1736 08/01/24 2000 08/02/24 0520 08/02/24 1722 08/03/24 1200 08/03/24 1644  WBC  --  22.6*  --  18.6*  --  19.4* 15.4* 12.7*  --   CREATININE 1.23* 1.08*  --   --  1.20* 1.12*  --  1.11*  --   VANCOTROUGH  --   --   --   --   --   --   --   --  17  VANCORANDOM  --   --  13  --   --   --   --   --   --     Estimated Creatinine Clearance: 62.4 mL/min (A) (by C-G formula based on SCr of 1.11 mg/dL (H)).    Allergies  Allergen Reactions   Codeine Anxiety    i don't feel right at all   Latex Rash    Antimicrobials this admission: Zosyn  9/15>  Vancomycin  9/17> Fluconazole  9/17>  Dose adjustments this admission:   Microbiology results: 9/9 MRSA neg  Thank you for allowing pharmacy to be a part of this patient's care.  Dionicia Canavan, PharmD, RPh PGY1 Acute Care Pharmacy Resident [facility]  08/03/2024 6:04 PM  **Pharmacist phone directory can now be found on [email or web address] ([email or web address]).  Listed under [facility].

## 2024-08-03 NOTE — Progress Notes (Signed)
 [facility] Progress Note Patient: PELAGIA Gray FMW:[id number] DOB: 11/25/1950  DOA: 07/04/2024 DOS: the patient was seen and examined on 08/03/2024  Brief Hospital Course: 73 yo female presented 07/05/2023 due to gastric outlet obstruction with findings of underlying duodenal cancer.  The patient underwent a Whipple procedure on 07/22/2024.   Assessment and Plan: Duodenal adenocarcinoma with gastric outlet obstruction Status post Whipple's procedure and jejunostomy creation. CT scan showed dilated 1st and 2nd portion of duodenum with possible underlying mass. GI was consulted, 8/24 underwent small bowel enteroscopy. Duodenal mass biopsy was positive for adenocarcinoma. General surgery was consulted. Due to poor nutritional status from the mass patient was started on TPN first. Surgery (Whipple procedure) was performed on 07/22/2024.  Was on Dilaudid  PCA CT abdomen and chest on 9/17 shows no new changes. Due to worsening leukocytosis antibiotic regimen-broaden. Management per surgery.  Bilateral pleural effusion. Multifocal pneumonia Reports of shortness of breath. CT chest negative for PE although altered study was not designed for PE. CT chest shows evidence of bilateral pleural effusion likely third spacing in the setting of hypoalbuminemia. Repeat chest x-ray shows persistent pneumonia. Underwent thoracentesis.  250 mL fluid.  No evidence of hemothorax.  Appears to be exudative fluid based on LDH.  Normocytic anemia postop blood loss anemia with a combination of dilution from IV fluids Transfuse hemoglobin less than 7 or hemodynamic instability.  Iron level normal.  Folic acid within range.  B12 normal.  Smear does not show any evidence of exercise.  Reticulocyte count elevated but CT abdomen negative for any acute blood loss.  No hemothorax on thoracentesis. 1 PRBC transfusion on 9/19 followed by 2 more PRBC transfusion. May require hematology consultation if hemoglobin  continues to drift down.   Nutrition Currently at goal rate for tube feeds. TPN will be discontinued 9/20. Can only have sips orally.   Hypokalemia/hypophosphatemia Resolved.    Essential hypertension Blood pressure is elevated,  Continue current medications.   Hyperglycemia Continue sliding scale insulin  only for now   AKI on CKD 2 Continue with IV fluid. Baseline creatinine normal.  On admission serum creatinine was 1.35.  Creatinine fluctuating.  Monitor.   Mild elevated lipase Resolved  Anxiety and insomnia. Trazodone  at nighttime and Ativan  as needed.  Obesity Class 3 Body mass index is 41.23 kg/m.  Placing the pt at higher risk of poor outcomes.   Hypernatremia. Increasing free water .   Subjective: No nausea no vomiting.  More alert and interactive today.  Pain well-controlled.  Breathing still heavy.  Physical Exam: Basal crackles. S1-S2 present Bowel sound present Trace edema.  Data Reviewed: I have Reviewed nursing notes, Vitals, and Lab results. Since last encounter, pertinent lab results CBC and BMP   . I have ordered test including CBC and BMP  .   Disposition: Status is: Inpatient Remains inpatient appropriate because: Monitor for improvement in postop recovery  SCD's Start: 07/22/24 2315 SCDs Start: 07/04/24 2321   Family Communication: Family at bedside Level of care: Progressive   Vitals:   08/03/24 0729 08/03/24 1100 08/03/24 1641 08/03/24 1647  BP: (!) 163/77 (!) 167/74  (!) 166/61  Pulse: 89 90 85 87  Resp: 20 (!) 22  20  Temp: 98.1 F (36.7 C) 98.1 F (36.7 C)  98 F (36.7 C)  TempSrc: Oral Oral  Oral  SpO2: 93% 92% 96% 98%  Weight:      Height:         Author: Yetta Blanch, MD 08/03/2024 7:10 PM  Please look on [email or web address] to find out who is on call.

## 2024-08-03 NOTE — Progress Notes (Signed)
 12 Days Post-Op   Subjective/Chief Complaint: Up in the chair this morning, drinking hot tea Reports ~4-5 BMs last 24h Underwent  Otherwise no acute events Objective: Vital signs in last 24 hours: Temp:  [97.4 F (36.3 C)-98.5 F (36.9 C)] 98.1 F (36.7 C) (09/21 0729) Pulse Rate:  [89-94] 89 (09/21 0729) Resp:  [12-30] 20 (09/21 0729) BP: (125-175)/(55-83) 163/77 (09/21 0729) SpO2:  [91 %-96 %] 93 % (09/21 0729) Last BM Date : 08/02/24  Intake/Output from previous day: 09/20 0701 - 09/21 0700 In: 2951.8 [I.V.:1349.2; NG/GT:769.2; IV Piggyback:743.5] Out: 2125 [Urine:1750; Drains:375] Intake/Output this shift: Total I/O In: -  Out: 75 [Drains:75]  General: no acute distress Resp: No respiratory distress with normal rate CV: Regular rate and rhythm Abd:  soft, midline wound without any erythema or drainage. Some leakage around J tube and some leakage around right blake drains.  Drains purulent/cloudy (375 mL documented) Ext:  SCDs in place  Lab Results:  Recent Labs    08/02/24 0520 08/02/24 1722  WBC 19.4* 15.4*  HGB 9.4* 9.2*  HCT 28.3* 28.5*  PLT 538* 521*    BMET Recent Labs    08/01/24 2000 08/02/24 0520  NA 145 145  K 4.0 4.1  CL 107 106  CO2 28 28  GLUCOSE 265* 219*  BUN 36* 33*  CREATININE 1.20* 1.12*  CALCIUM  7.5* 7.6*   PT/INR Recent Labs    07/31/24 1137  LABPROT 15.7*  INR 1.2    ABG No results for input(s): PHART, HCO3 in the last 72 hours.  Invalid input(s): PCO2, PO2   Studies/Results: US  THORACENTESIS ASP PLEURAL SPACE W/IMG GUIDE Result Date: 08/02/2024 INDICATION: 73 year old female with right pleural effusion. Request for diagnostic thoracentesis. EXAM: ULTRASOUND GUIDED RIGHT THORACENTESIS MEDICATIONS: 10 mL 1% lidocaine  COMPLICATIONS: None immediate. PROCEDURE: An ultrasound guided thoracentesis was thoroughly discussed with the patient and questions answered. The benefits, risks, alternatives and complications  were also discussed. The patient understands and wishes to proceed with the procedure. Written consent was obtained. Ultrasound was performed to localize and mark an adequate pocket of fluid in the right chest. The area was then prepped and draped in the normal sterile fashion. 1% Lidocaine  was used for local anesthesia. Under ultrasound guidance a 6 Fr Safe-T-Centesis catheter was introduced. Thoracentesis was performed. The catheter was removed and a dressing applied. FINDINGS: A total of approximately 250 mL of amber fluid was removed. Samples were sent to the laboratory as requested by the clinical team. IMPRESSION: Successful ultrasound guided right thoracentesis yielding 250 mL of pleural fluid. Performed by: Kacie Matthews PA-C Electronically Signed   By: Cordella Banner   On: 08/02/2024 15:28   DG Chest 1 View Result Date: 08/02/2024 CLINICAL DATA:  Pleural effusion.  Status post thoracentesis. EXAM: CHEST  1 VIEW COMPARISON:  08/01/2024 FINDINGS: Low lung volumes. The cardio pericardial silhouette is enlarged. Similar left base collapse/consolidation with small bilateral pleural effusions evident. No pneumothorax. Right PICC line tip overlies the mid SVC level. Telemetry leads overlie the chest. IMPRESSION: Low volume film with left base collapse/consolidation and small bilateral pleural effusions. No pneumothorax. Electronically Signed   By: Camellia Candle M.D.   On: 08/02/2024 11:44      Anti-infectives: Anti-infectives (From admission, onward)    Start     Dose/Rate Route Frequency Ordered Stop   08/02/24 1400  piperacillin -tazobactam (ZOSYN ) IVPB 3.375 g        3.375 g 12.5 mL/hr over 240 Minutes Intravenous Every 8  hours 08/01/24 2238     08/01/24 2330  cefTRIAXone  (ROCEPHIN ) injection 1 g       Placed in And Linked Group   1 g Intramuscular  Once 08/01/24 2238 08/02/24 0015   08/01/24 1800  vancomycin  (VANCOCIN ) IVPB 1000 mg/200 mL premix        1,000 mg 200 mL/hr over 60 Minutes  Intravenous Every 12 hours 08/01/24 1642     08/01/24 1047  vancomycin  variable dose per unstable renal function (pharmacist dosing)  Status:  Discontinued         Does not apply See admin instructions 08/01/24 1047 08/01/24 1646   07/30/24 2300  vancomycin  (VANCOCIN ) IVPB 1000 mg/200 mL premix  Status:  Discontinued        1,000 mg 200 mL/hr over 60 Minutes Intravenous Every 12 hours 07/30/24 1029 08/01/24 1047   07/30/24 1400  piperacillin -tazobactam (ZOSYN ) IVPB 3.375 g  Status:  Discontinued        3.375 g 12.5 mL/hr over 240 Minutes Intravenous Every 8 hours 07/30/24 1225 08/01/24 2238   07/30/24 1115  fluconazole  (DIFLUCAN ) IVPB 400 mg        400 mg 100 mL/hr over 120 Minutes Intravenous Every 24 hours 07/30/24 1025     07/30/24 1100  vancomycin  (VANCOREADY) IVPB 2000 mg/400 mL        2,000 mg 200 mL/hr over 120 Minutes Intravenous  Once 07/30/24 1010 07/30/24 1540   07/28/24 0945  piperacillin -tazobactam (ZOSYN ) IVPB 3.375 g  Status:  Discontinued        3.375 g 12.5 mL/hr over 240 Minutes Intravenous Every 8 hours 07/28/24 0853 07/30/24 1131   07/23/24 0000  ceFAZolin  (ANCEF ) IVPB 2g/100 mL premix        2 g 200 mL/hr over 30 Minutes Intravenous Every 8 hours 07/22/24 2314 07/23/24 0058   07/22/24 0900  ceFAZolin  (ANCEF ) IVPB 2g/100 mL premix        2 g 200 mL/hr over 30 Minutes Intravenous To ShortStay Surgical 07/21/24 1938 07/22/24 1432       Assessment/Plan: s/p Procedure(s): WHIPPLE PROCEDURE (N/A) CREATION, JEJUNOSTOMY (N/A)- byerly 07/22/24 POD 11- Obstructing 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  pT3bN1 final path.   Prn morphine , oxy, robaxin , tylenol  CBC and CMP are pending this AM, monitor LFTs/bili and creatinine AKI, creatinine  ABL anemia, anemia chronic disease. 2 u PRBC on 9/9, s/p 3 u RBC 9/19 for hgb 6.4 >> 9.4 yesterday Zosyn /fluc/vanc for panc leak and pneumonia.   Leave out NGT, sips of clears OK.  Would not advance for a  while.  Ok for patient to have reglan if Qtc can tolerate that.   TF now at goal. Ok to stop TPN. She is having multiple loose stools, banatrol BID t  D/C foley, encourage use of bedside commode  PT/OT - she is working with therapies well, walking from bed to chair with modA +2  Defer management of blood sugar/HTN to medicine.       LOS: 30 days  Lauren Gray, [facility] Please see Amion for pager number during day hours 7:00am-4:30pm

## 2024-08-04 DIAGNOSIS — K56609 Unspecified intestinal obstruction, unspecified as to partial versus complete obstruction: Secondary | ICD-10-CM | POA: Diagnosis not present

## 2024-08-04 LAB — GLUCOSE, CAPILLARY
Glucose-Capillary: 140 mg/dL — ABNORMAL HIGH (ref 70–99)
Glucose-Capillary: 144 mg/dL — ABNORMAL HIGH (ref 70–99)
Glucose-Capillary: 155 mg/dL — ABNORMAL HIGH (ref 70–99)
Glucose-Capillary: 162 mg/dL — ABNORMAL HIGH (ref 70–99)
Glucose-Capillary: 163 mg/dL — ABNORMAL HIGH (ref 70–99)
Glucose-Capillary: 181 mg/dL — ABNORMAL HIGH (ref 70–99)
Glucose-Capillary: 188 mg/dL — ABNORMAL HIGH (ref 70–99)

## 2024-08-04 LAB — CBC WITH DIFFERENTIAL/PLATELET
Abs Immature Granulocytes: 0.12 K/uL — ABNORMAL HIGH (ref 0.00–0.07)
Basophils Absolute: 0 K/uL (ref 0.0–0.1)
Basophils Relative: 0 %
Eosinophils Absolute: 0.4 K/uL (ref 0.0–0.5)
Eosinophils Relative: 3 %
HCT: 27 % — ABNORMAL LOW (ref 36.0–46.0)
Hemoglobin: 8.7 g/dL — ABNORMAL LOW (ref 12.0–15.0)
Immature Granulocytes: 1 %
Lymphocytes Relative: 14 %
Lymphs Abs: 1.7 K/uL (ref 0.7–4.0)
MCH: 30.3 pg (ref 26.0–34.0)
MCHC: 32.2 g/dL (ref 30.0–36.0)
MCV: 94.1 fL (ref 80.0–100.0)
Monocytes Absolute: 0.7 K/uL (ref 0.1–1.0)
Monocytes Relative: 5 %
Neutro Abs: 9.2 K/uL — ABNORMAL HIGH (ref 1.7–7.7)
Neutrophils Relative %: 77 %
Platelets: 511 K/uL — ABNORMAL HIGH (ref 150–400)
RBC: 2.87 MIL/uL — ABNORMAL LOW (ref 3.87–5.11)
RDW: 17.9 % — ABNORMAL HIGH (ref 11.5–15.5)
WBC: 12 K/uL — ABNORMAL HIGH (ref 4.0–10.5)
nRBC: 1.6 % — ABNORMAL HIGH (ref 0.0–0.2)

## 2024-08-04 LAB — COMPREHENSIVE METABOLIC PANEL WITH GFR
ALT: 37 U/L (ref 0–44)
AST: 20 U/L (ref 15–41)
Albumin: <1.5 g/dL — ABNORMAL LOW (ref 3.5–5.0)
Alkaline Phosphatase: 108 U/L (ref 38–126)
Anion gap: 7 (ref 5–15)
BUN: 18 mg/dL (ref 8–23)
CO2: 24 mmol/L (ref 22–32)
Calcium: 7.3 mg/dL — ABNORMAL LOW (ref 8.9–10.3)
Chloride: 111 mmol/L (ref 98–111)
Creatinine, Ser: 1.16 mg/dL — ABNORMAL HIGH (ref 0.44–1.00)
GFR, Estimated: 50 mL/min — ABNORMAL LOW (ref >60)
Glucose, Bld: 123 mg/dL — ABNORMAL HIGH (ref 70–99)
Potassium: 3.8 mmol/L (ref 3.5–5.1)
Sodium: 142 mmol/L (ref 135–145)
Total Bilirubin: 1 mg/dL (ref 0.0–1.2)
Total Protein: 6.1 g/dL — ABNORMAL LOW (ref 6.5–8.1)

## 2024-08-04 LAB — PATHOLOGIST SMEAR REVIEW

## 2024-08-04 LAB — MAGNESIUM: Magnesium: 1.9 mg/dL (ref 1.7–2.4)

## 2024-08-04 MED ORDER — OSMOLITE 1.5 CAL PO LIQD
1000.0000 mL | ORAL | Status: DC
  Administered 2024-08-04 – 2024-08-06 (×4): 1000 mL
  Filled 2024-08-04 (×5): qty 1000

## 2024-08-04 MED ORDER — LOPERAMIDE HCL 2 MG PO CAPS
2.0000 mg | ORAL_CAPSULE | Freq: Three times a day (TID) | ORAL | Status: DC
  Administered 2024-08-04 – 2024-08-15 (×33): 2 mg via ORAL
  Filled 2024-08-04 (×35): qty 1

## 2024-08-04 MED ORDER — FREE WATER
150.0000 mL | Status: DC
  Administered 2024-08-04 – 2024-08-05 (×14): 150 mL

## 2024-08-04 MED ORDER — HEPARIN SODIUM (PORCINE) 5000 UNIT/ML IJ SOLN
5000.0000 [IU] | Freq: Three times a day (TID) | INTRAMUSCULAR | Status: DC
Start: 2024-08-05 — End: 2024-08-15
  Administered 2024-08-05 – 2024-08-15 (×32): 5000 [IU] via SUBCUTANEOUS
  Filled 2024-08-04 (×32): qty 1

## 2024-08-04 NOTE — Progress Notes (Signed)
 Nutrition Follow-up  DOCUMENTATION CODES:  Obesity unspecified  INTERVENTION:  Continue diet advancement as recommended by Surgery Currently on clear liquid diet; full liquid trial tomorrow  Continue TF via J-tube: Decrease Osmolite 1.5 to 55ml/hr (1320ml daily) 60ml ProSource TF20 once daily Provides 2060 kcal, 103g protein and 1006ml free water  daily  Current free water  flushes per MD- 300ml q4h (1800ml daily) Given these are provided via J-tube, reached out to MD with recommendation to decrease volume and increase frequency for best tolerance   Monitor stool output following addition of imodium  today. Continue Banatrol BID Can consider adjusting tube feed to semi-elemental formula if no improvements noted Recommend Vital 1.5 at 44ml/hr ( daily) 60ml ProSource TF20 once daily Provides 2060 kcal, 109g protein, free water  daily  May need to consider pancreatic enzymes  NUTRITION DIAGNOSIS:  Inadequate oral intake related to nausea, vomiting (GOO r/t duodenal mass) as evidenced by per patient/family report. - remains applicable  GOAL:  Patient will meet greater than or equal to 90% of their needs - goal met via TF  MONITOR:  Diet advancement, Labs, Weight trends, TF tolerance, Skin, I & O's, Other (Comment) (TPN)  REASON FOR ASSESSMENT:  Consult New TPN/TNA  ASSESSMENT:  73 y/o female with h/o CKD II, hiatal hernia, HLD, pre-diabetes, s/p hysterectomy and pelvic mass s/p laparoscopic bilateral salpingo-oophorectomy (2007) who is admitted with AKI and GOO secondary to duodenal mass (adenocarcinoma) now s/p classic pancreaticoduodenectomy, placement of pancreatic duct stent and placement of (18 [place]) feeding jejunostomy tube 9/9.  8/24 s/p small bowel enteroscopy, duodenal mass biopsy positive for adenocarcinoma 8/26 PTN initiation 9/9 s/p whipple; PEJ tube placed 9/10 trickle tube feeds initiated 9/14: NGT to suction for emesis; TF held 9/15 trickle tube  feeds restarted 9/16 tube feed advancement 9/20 TPN discontinued; TF at goal; s/p thoracentesis yield 9/21: sips of clear liquids  Spoke with patient, her daughter and husband at bedside.   Pt reports that she is taking very little clear liquids via PO. She states that since her surgery, she has been very sensitive to sugar containing foods mentioning that it tastes too sweet. She enjoys having hot tea with the lemon powder instead of coffee that is being delivered. Will adjust order on meal trays.  Of note, Surgery mentions plan to try limited full liquids tomorrow.  Pt reports that she continues with multiple loose stools daily. Her daughter states that they have some texture, are not oily and appear to be tube feed colored (light brown). Encouraged to continue to monitor for any changes in stool to assess for malabsorptive symptoms. Surgery added imodium  today TID today. Discussed with patient plan to allow a couple days to determine if addition of imodium  helps with frequency, if not will transition to a semi-elemental formula.   Admit weight: 109.3 kg Current weight: 124 kg (+ non-pitting generalized, BUE, BLE edema)  Right abdomen JP drain 1: output x24 hours Right abdominal JP drain 2: 20ml output x24 hours  Medications: SSI 0-20 units q4h, novolog  5 units q4h, lantus  10 units daily, IV diflucan , IV abx  Labs:  Cr 1.16 Corrected calcium  9.3 Albumin  <1.5 GFR 50 CBG's 140-205 x24 hours  Diet Order:   Diet Order             Diet clear liquid Fluid consistency: Thin  Diet effective now                   EDUCATION NEEDS:   Education needs  have been addressed  Skin:  Skin Assessment: Reviewed RN Assessment (incision abdomen, JP drain, VAC)  Last BM:  9/21 type 6/7 large x3  Height:   Ht Readings from Last 1 Encounters:  07/22/24 5' 8 (1.727 m)    Weight:   Wt Readings from Last 1 Encounters:  08/04/24 124 kg    Ideal Body Weight:  63.6  kg  BMI:  Body mass index is 41.57 kg/m.  Estimated Nutritional Needs:   Kcal:  2000-2200  Protein:  100-115g  Fluid:  1.9-2.2L/day  Lauren Gray, RDN, LDN Clinical Nutrition See AMiON for contact information.

## 2024-08-04 NOTE — Progress Notes (Signed)
 Physical Therapy Treatment Patient Details Name: Lauren Gray MRN: [id number] DOB: 01/12/1951 Today's Date: 08/04/2024   History of Present Illness Pt is a 73 y/o female presenting with 1 month of nausea/vomiting. CT abdomen showed SBO from underlying mass. Mass biopsy showed + invasive adenocarcinoma. Requiring TPN. S/p pancreaticoduodenectomy, pancreatic duct stent placement, J-tube placement 9/9. PMH: hypokalemia, HTN, CKD, prediabetes    PT Comments  Pt progressing towards goals; ;limited by pain, weakness/fatigue. Pt with Min A for bed mobility, Min A +2 for sit to stand and stepping from EOB to recliner for step pivot transfer with RW for ~ 4 ft. Pt spouse present and supportive. Due to pt current functional status, home set up and available assistance at home recommending skilled physical therapy services > 3 hours/day in order to address strength, balance and functional mobility to decrease risk for falls, injury, immobility, skin break down and re-hospitalization.      If plan is discharge home, recommend the following: A lot of help with walking and/or transfers;A little help with bathing/dressing/bathroom;Assist for transportation;Help with stairs or ramp for entrance;Assistance with cooking/housework     Equipment Recommendations  Rolling walker (2 wheels)       Precautions / Restrictions Precautions Precautions: Fall;Other (comment) Precaution/Restrictions Comments: JP drainx2 (L) Restrictions Weight Bearing Restrictions Per Provider Order: No     Mobility  Bed Mobility Overal bed mobility: Needs Assistance Bed Mobility: Sit to Supine     Supine to sit: Min assist     General bed mobility comments: Min A with cues for body mechanics and Min A for trunk to mid line with increased time    Transfers Overall transfer level: Needs assistance Equipment used: Rolling walker (2 wheels) Transfers: Sit to/from Stand, Bed to chair/wheelchair/BSC Sit to Stand: Min assist, +2  safety/equipment, +2 physical assistance   Step pivot transfers: +2 safety/equipment, Contact guard assist       General transfer comment: Min A +2 for sit to stand from EOB with increased time and verbal cues for body mechanics/hand placement. CGA for stepping from EOB to recliner with verbal cues for safety and squeezing pillow before sitting. Pt has poor eccentric control for lowering to sitting from standing.    Ambulation/Gait   Pre-gait activities: pt taking steps from EOB to recliner for ~ 4 ft with reciprocal gait pattern with short step length and mid foot initial contact. Moderate UE support on RW and CGA for safety       Balance Overall balance assessment: Needs assistance Sitting-balance support: Feet supported, Single extremity supported Sitting balance-Leahy Scale: Fair Sitting balance - Comments: Able to support self with UE sitting EOB   Standing balance support: During functional activity, Bilateral upper extremity supported, Reliant on assistive device for balance Standing balance-Leahy Scale: Poor Standing balance comment: CGA for safety and moderate UE support on RW for balance/support      Communication Communication Communication: No apparent difficulties  Cognition Arousal: Alert Behavior During Therapy: WFL for tasks assessed/performed, Anxious   PT - Cognitive impairments: No apparent impairments     Following commands: Intact      Cueing Cueing Techniques: Verbal cues     General Comments General comments (skin integrity, edema, etc.): Spouse present during session      Pertinent Vitals/Pain Pain Assessment Pain Assessment: Faces Faces Pain Scale: Hurts even more Facial Expression: Grimacing Body Movements: Protection Muscle Tension: Tense, rigid Compliance with ventilator (intubated pts.): N/A Vocalization (extubated pts.): Talking in normal tone or no sound  CPOT Total: 4 Pain Location: Abdomen with activity Pain Descriptors /  Indicators: Grimacing, Aching Pain Intervention(s): Monitored during session, Limited activity within patient's tolerance     PT Goals (current goals can now be found in the care plan section) Acute Rehab PT Goals Patient Stated Goal: Return to independence PT Goal Formulation: With patient Time For Goal Achievement: 08/08/24 Potential to Achieve Goals: Good Progress towards PT goals: Progressing toward goals    Frequency    Min 3X/week      PT Plan  Continue with current POC         AM-PAC PT 6 Clicks Mobility   Outcome Measure  Help needed turning from your back to your side while in a flat bed without using bedrails?: A Little Help needed moving from lying on your back to sitting on the side of a flat bed without using bedrails?: A Little Help needed moving to and from a bed to a chair (including a wheelchair)?: A Lot Help needed standing up from a chair using your arms (e.g., wheelchair or bedside chair)?: A Lot Help needed to walk in hospital room?: A Lot Help needed climbing 3-5 steps with a railing? : Total 6 Click Score: 13    End of Session Equipment Utilized During Treatment: Oxygen Activity Tolerance: Patient tolerated treatment well Patient left: in chair;with call bell/phone within reach;with chair alarm set;with family/visitor present Nurse Communication: Mobility status PT Visit Diagnosis: Other abnormalities of gait and mobility (R26.89);Pain;Muscle weakness (generalized) (M62.81) Pain - part of body:  (abdomen)     Time: 1214-1227 PT Time Calculation (min) (ACUTE ONLY): 13 min  Charges:    $Therapeutic Activity: 8-22 mins PT General Charges $$ ACUTE PT VISIT: 1 Visit                    Dorothyann Maier, DPT, CLT  Acute Rehabilitation Services Office: [phone] (Secure chat preferred)    Dorothyann VEAR Maier 08/04/2024, 1:29 PM

## 2024-08-04 NOTE — Progress Notes (Signed)
 [facility] Progress Note Patient: Lauren Gray FMW:[id number] DOB: 1951/06/12  DOA: 07/04/2024 DOS: the patient was seen and examined on 08/04/2024  Brief Hospital Course: 73 yo female presented 07/05/2023 due to gastric outlet obstruction with findings of underlying duodenal cancer.  The patient underwent a Whipple procedure on 07/22/2024.   Assessment and Plan: Duodenal adenocarcinoma with gastric outlet obstruction Status post Whipple's procedure and jejunostomy creation. CT scan showed dilated 1st and 2nd portion of duodenum with possible underlying mass. GI was consulted, 8/24 underwent small bowel enteroscopy. Duodenal mass biopsy was positive for adenocarcinoma. General surgery was consulted. Due to poor nutritional status from the mass patient was started on TPN first. Surgery (Whipple procedure) was performed on 07/22/2024.  Was on Dilaudid  PCA CT abdomen and chest on 9/17 shows no new changes. Due to worsening leukocytosis antibiotic regimen-broaden. Management per surgery.  Bilateral pleural effusion. Multifocal pneumonia Reports of shortness of breath. CT chest negative for PE although altered study was not designed for PE. CT chest shows evidence of bilateral pleural effusion likely third spacing in the setting of hypoalbuminemia. Repeat chest x-ray shows persistent pneumonia. Underwent thoracentesis.  250 mL fluid.  No evidence of hemothorax.  Appears to be exudative fluid based on LDH. Cytology negative for any malignant cells.  Cultures so far negative as well.  Normocytic anemia postop blood loss anemia with a combination of dilution from IV fluids Transfuse hemoglobin less than 7 or hemodynamic instability. Iron level normal.  Folic acid within range.  B12 normal.  Smear does not show any evidence of exercise.  Reticulocyte count elevated but CT abdomen negative for any acute blood loss.  No hemothorax on thoracentesis. 1 PRBC transfusion on 9/19 followed by 2 more  PRBC transfusion. Now hemoglobin stable.   Nutrition Currently at goal rate for tube feeds. TPN discontinued.  Clear liquid diet.   Hypokalemia/hypophosphatemia Resolved.    Essential hypertension Blood pressure now better. Continue current medications.   Hyperglycemia Continue sliding scale insulin  only for now   AKI on CKD 2 Treated with IV fluid.  Now stopped. Baseline creatinine normal.  On admission serum creatinine was 1.35.  Creatinine fluctuating.  Monitor.   Mild elevated lipase Resolved  Anxiety and insomnia. Trazodone  at nighttime and Ativan  as needed.  Obesity Class 3 Body mass index is 41.57 kg/m.  Placing the pt at higher risk of poor outcomes.   Hypernatremia. Continue free water . Monitor.  Thrombocytosis. Likely stress reaction. Monitor.  LFT elevation. Resolving.   Subjective: No nausea no vomiting.  Fatigued tired today.  No no fever no chills.  Diarrhea remains an issue  Physical Exam: Clear to auscultation S1-S2 present abdomen bowel sounds Trace edema bilaterally.  Data Reviewed: I have Reviewed nursing notes, Vitals, and Lab results. Since last encounter, pertinent lab results CBC and BMP   . I have ordered test including CBC and BMP  .   Disposition: Status is: Inpatient Remains inpatient appropriate because: Monitor for improvement in oral intake  SCD's Start: 07/22/24 2315 SCDs Start: 07/04/24 2321   Family Communication: Family at bedside Level of care: Progressive   Vitals:   08/04/24 1147 08/04/24 1200 08/04/24 1636 08/04/24 1653  BP: (!) 160/73 (!) 158/73 (!) 162/76 (!) 156/58  Pulse: 86 85 85 85  Resp: 18 17 20 20   Temp: 97.7 F (36.5 C)  97.6 F (36.4 C)   TempSrc: Oral  Oral   SpO2: 97% 95% 97% 95%  Weight:      Height:  Author: Yetta Blanch, MD 08/04/2024 6:40 PM  Please look on [email or web address] to find out who is on call.

## 2024-08-04 NOTE — Progress Notes (Signed)
 13 Days Post-Op   Subjective/Chief Complaint: Transferred to stepdown. Having a lot of diarrhea. Having issues sleeping and feeling very weak due to diarrhea  Objective: Vital signs in last 24 hours: Temp:  [98 F (36.7 C)-98.6 F (37 C)] 98.6 F (37 C) (09/22 0757) Pulse Rate:  [85-91] 91 (09/22 0757) Resp:  [19-27] 19 (09/22 0757) BP: (141-170)/(61-76) 141/76 (09/22 0757) SpO2:  [92 %-98 %] 96 % (09/22 0757) Weight:  [875 kg] 124 kg (09/22 0222) Last BM Date : 08/03/24  Intake/Output from previous day: 09/21 0701 - 09/22 0700 In: 1360 [NG/GT:1300] Out: 1135 [Urine:800; Drains:335] Intake/Output this shift: No intake/output data recorded.  General: looking much better. Resp: No respiratory distress with normal rate CV: Regular rate and rhythm Abd:  soft, midline wound without any erythema or drainage. Some leakage around J tube and some leakage around right blake drains.  Drains purulent/cloudy (375 mL documented) Ext:  SCDs in place  Lab Results:  Recent Labs    08/03/24 1200 08/04/24 0740  WBC 12.7* 12.0*  HGB 9.2* 8.7*  HCT 29.2* 27.0*  PLT 543* 511*    BMET Recent Labs    08/03/24 1200 08/04/24 0740  NA 146* 142  K 3.9 3.8  CL 111 111  CO2 25 24  GLUCOSE 219* 123*  BUN 25* 18  CREATININE 1.11* 1.16*  CALCIUM  7.5* 7.3*   PT/INR No results for input(s): LABPROT, INR in the last 72 hours.   ABG No results for input(s): PHART, HCO3 in the last 72 hours.  Invalid input(s): PCO2, PO2   Studies/Results: US  THORACENTESIS ASP PLEURAL SPACE W/IMG GUIDE Result Date: 08/02/2024 INDICATION: 73 year old female with right pleural effusion. Request for diagnostic thoracentesis. EXAM: ULTRASOUND GUIDED RIGHT THORACENTESIS MEDICATIONS: 10 mL 1% lidocaine  COMPLICATIONS: None immediate. PROCEDURE: An ultrasound guided thoracentesis was thoroughly discussed with the patient and questions answered. The benefits, risks, alternatives and complications were  also discussed. The patient understands and wishes to proceed with the procedure. Written consent was obtained. Ultrasound was performed to localize and mark an adequate pocket of fluid in the right chest. The area was then prepped and draped in the normal sterile fashion. 1% Lidocaine  was used for local anesthesia. Under ultrasound guidance a 6 Fr Safe-T-Centesis catheter was introduced. Thoracentesis was performed. The catheter was removed and a dressing applied. FINDINGS: A total of approximately 250 mL of amber fluid was removed. Samples were sent to the laboratory as requested by the clinical team. IMPRESSION: Successful ultrasound guided right thoracentesis yielding 250 mL of pleural fluid. Performed by: Kacie Matthews PA-C Electronically Signed   By: Cordella Banner   On: 08/02/2024 15:28   DG Chest 1 View Result Date: 08/02/2024 CLINICAL DATA:  Pleural effusion.  Status post thoracentesis. EXAM: CHEST  1 VIEW COMPARISON:  08/01/2024 FINDINGS: Low lung volumes. The cardio pericardial silhouette is enlarged. Similar left base collapse/consolidation with small bilateral pleural effusions evident. No pneumothorax. Right PICC line tip overlies the mid SVC level. Telemetry leads overlie the chest. IMPRESSION: Low volume film with left base collapse/consolidation and small bilateral pleural effusions. No pneumothorax. Electronically Signed   By: Camellia Candle M.D.   On: 08/02/2024 11:44      Anti-infectives: Anti-infectives (From admission, onward)    Start     Dose/Rate Route Frequency Ordered Stop   08/02/24 1400  piperacillin -tazobactam (ZOSYN ) IVPB 3.375 g        3.375 g 12.5 mL/hr over 240 Minutes Intravenous Every 8 hours 08/01/24 2238  08/01/24 2330  cefTRIAXone  (ROCEPHIN ) injection 1 g       Placed in And Linked Group   1 g Intramuscular  Once 08/01/24 2238 08/02/24 0015   08/01/24 1800  vancomycin  (VANCOCIN ) IVPB 1000 mg/200 mL premix        1,000 mg 200 mL/hr over 60 Minutes  Intravenous Every 12 hours 08/01/24 1642     08/01/24 1047  vancomycin  variable dose per unstable renal function (pharmacist dosing)  Status:  Discontinued         Does not apply See admin instructions 08/01/24 1047 08/01/24 1646   07/30/24 2300  vancomycin  (VANCOCIN ) IVPB 1000 mg/200 mL premix  Status:  Discontinued        1,000 mg 200 mL/hr over 60 Minutes Intravenous Every 12 hours 07/30/24 1029 08/01/24 1047   07/30/24 1400  piperacillin -tazobactam (ZOSYN ) IVPB 3.375 g  Status:  Discontinued        3.375 g 12.5 mL/hr over 240 Minutes Intravenous Every 8 hours 07/30/24 1225 08/01/24 2238   07/30/24 1115  fluconazole  (DIFLUCAN ) IVPB 400 mg        400 mg 100 mL/hr over 120 Minutes Intravenous Every 24 hours 07/30/24 1025     07/30/24 1100  vancomycin  (VANCOREADY) IVPB 2000 mg/400 mL        2,000 mg 200 mL/hr over 120 Minutes Intravenous  Once 07/30/24 1010 07/30/24 1540   07/28/24 0945  piperacillin -tazobactam (ZOSYN ) IVPB 3.375 g  Status:  Discontinued        3.375 g 12.5 mL/hr over 240 Minutes Intravenous Every 8 hours 07/28/24 0853 07/30/24 1131   07/23/24 0000  ceFAZolin  (ANCEF ) IVPB 2g/100 mL premix        2 g 200 mL/hr over 30 Minutes Intravenous Every 8 hours 07/22/24 2314 07/23/24 0058   07/22/24 0900  ceFAZolin  (ANCEF ) IVPB 2g/100 mL premix        2 g 200 mL/hr over 30 Minutes Intravenous To ShortStay Surgical 07/21/24 1938 07/22/24 1432       Assessment/Plan: s/p Procedure(s): WHIPPLE PROCEDURE (N/A) CREATION, JEJUNOSTOMY (N/A)- Corley Maffeo 07/22/24 POD 12- 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  pT3bN1 final path.   Prn morphine , oxy, robaxin , tylenol  T bili down.  AKI, creatinine  ABL anemia, anemia chronic disease. 2 u PRBC on 9/9, stableyesterday Zosyn /fluc/vanc for panc leak and pneumonia.   Clears. Try limited fulls tomorrow  TF now at goal. Off TPN, banatrol BID. Add imodium .   D/C foley, encourage use of bedside  commode  PT/OT - she is working with therapies well, walking from bed to chair with modA +2  Defer management of blood sugar/HTN to medicine.       LOS: 31 days    Jina LITTIE Nephew, MD, FACS, FSSO Surgical Oncology, General Surgery, Trauma and Critical [facility], [place] [phone] for weekday/non holidays Check [email or web address] for coverage night/weekend/holidays

## 2024-08-04 NOTE — Progress Notes (Signed)
 Mobility Specialist Progress Note:    08/04/24 1300  Mobility  Activity Pivoted/transferred from chair to bed  Level of Assistance Moderate assist, patient does 50-74% (+2)  Assistive Device Front wheel walker  Distance Ambulated (ft) 2 ft  Activity Response Tolerated well  Mobility Referral Yes  Mobility visit 1 Mobility  Mobility Specialist Start Time (ACUTE ONLY) 1300  Mobility Specialist Stop Time (ACUTE ONLY) 1308  Mobility Specialist Time Calculation (min) (ACUTE ONLY) 8 min   Pt received in chair, requesting to transfer back to bed. ModA+2 to stand with RW. Tolerated well, asx throughout. Pt lying comfortably in bed with all needs met.   Roye Gustafson Mobility Specialist Please contact via Special educational needs teacher or  Rehab office at [phone]

## 2024-08-04 NOTE — Progress Notes (Signed)
 Inpatient Rehab Admissions Coordinator:   CIR following. She is currently on clear liquids,  not yet ready for CIR at this time, but will continue to follow for potential admit once medically stable.  Leita Kleine, MS, CCC-SLP Rehab Admissions Coordinator  [phone] (celll) [phone] (office)

## 2024-08-05 DIAGNOSIS — K56609 Unspecified intestinal obstruction, unspecified as to partial versus complete obstruction: Secondary | ICD-10-CM | POA: Diagnosis not present

## 2024-08-05 LAB — COMPREHENSIVE METABOLIC PANEL WITH GFR
ALT: 33 U/L (ref 0–44)
AST: 18 U/L (ref 15–41)
Albumin: <1.5 g/dL — ABNORMAL LOW (ref 3.5–5.0)
Alkaline Phosphatase: 103 U/L (ref 38–126)
Anion gap: 7 (ref 5–15)
BUN: 14 mg/dL (ref 8–23)
CO2: 23 mmol/L (ref 22–32)
Calcium: 7.3 mg/dL — ABNORMAL LOW (ref 8.9–10.3)
Chloride: 109 mmol/L (ref 98–111)
Creatinine, Ser: 1.19 mg/dL — ABNORMAL HIGH (ref 0.44–1.00)
GFR, Estimated: 48 mL/min — ABNORMAL LOW (ref >60)
Glucose, Bld: 179 mg/dL — ABNORMAL HIGH (ref 70–99)
Potassium: 4 mmol/L (ref 3.5–5.1)
Sodium: 139 mmol/L (ref 135–145)
Total Bilirubin: 1 mg/dL (ref 0.0–1.2)
Total Protein: 5.7 g/dL — ABNORMAL LOW (ref 6.5–8.1)

## 2024-08-05 LAB — CBC WITH DIFFERENTIAL/PLATELET
Abs Immature Granulocytes: 0.1 K/uL — ABNORMAL HIGH (ref 0.00–0.07)
Basophils Absolute: 0 K/uL (ref 0.0–0.1)
Basophils Relative: 0 %
Eosinophils Absolute: 0.4 K/uL (ref 0.0–0.5)
Eosinophils Relative: 4 %
HCT: 28 % — ABNORMAL LOW (ref 36.0–46.0)
Hemoglobin: 8.7 g/dL — ABNORMAL LOW (ref 12.0–15.0)
Immature Granulocytes: 1 %
Lymphocytes Relative: 13 %
Lymphs Abs: 1.3 K/uL (ref 0.7–4.0)
MCH: 29 pg (ref 26.0–34.0)
MCHC: 31.1 g/dL (ref 30.0–36.0)
MCV: 93.3 fL (ref 80.0–100.0)
Monocytes Absolute: 0.5 K/uL (ref 0.1–1.0)
Monocytes Relative: 5 %
Neutro Abs: 8.3 K/uL — ABNORMAL HIGH (ref 1.7–7.7)
Neutrophils Relative %: 77 %
Platelets: 507 K/uL — ABNORMAL HIGH (ref 150–400)
RBC: 3 MIL/uL — ABNORMAL LOW (ref 3.87–5.11)
RDW: 17.8 % — ABNORMAL HIGH (ref 11.5–15.5)
WBC: 10.7 K/uL — ABNORMAL HIGH (ref 4.0–10.5)
nRBC: 0.4 % — ABNORMAL HIGH (ref 0.0–0.2)

## 2024-08-05 LAB — BODY FLUID CULTURE W GRAM STAIN: Culture: NO GROWTH

## 2024-08-05 LAB — GLUCOSE, CAPILLARY
Glucose-Capillary: 133 mg/dL — ABNORMAL HIGH (ref 70–99)
Glucose-Capillary: 157 mg/dL — ABNORMAL HIGH (ref 70–99)
Glucose-Capillary: 189 mg/dL — ABNORMAL HIGH (ref 70–99)
Glucose-Capillary: 189 mg/dL — ABNORMAL HIGH (ref 70–99)
Glucose-Capillary: 205 mg/dL — ABNORMAL HIGH (ref 70–99)
Glucose-Capillary: 236 mg/dL — ABNORMAL HIGH (ref 70–99)
Glucose-Capillary: 92 mg/dL (ref 70–99)

## 2024-08-05 LAB — MAGNESIUM: Magnesium: 1.9 mg/dL (ref 1.7–2.4)

## 2024-08-05 MED ORDER — HYDRALAZINE HCL 20 MG/ML IJ SOLN
10.0000 mg | INTRAMUSCULAR | Status: DC | PRN
  Administered 2024-08-07: 10 mg via INTRAVENOUS
  Filled 2024-08-05: qty 1

## 2024-08-05 MED ORDER — FREE WATER
200.0000 mL | Status: DC
  Administered 2024-08-05 – 2024-08-07 (×11): 200 mL

## 2024-08-05 MED ORDER — AMLODIPINE BESYLATE 5 MG PO TABS
5.0000 mg | ORAL_TABLET | Freq: Every day | ORAL | Status: DC
  Administered 2024-08-06: 5 mg via ORAL
  Filled 2024-08-05: qty 1

## 2024-08-05 MED ORDER — METOPROLOL TARTRATE 25 MG/10 ML ORAL SUSPENSION
25.0000 mg | Freq: Two times a day (BID) | ORAL | Status: DC
  Administered 2024-08-05 (×2): 25 mg
  Filled 2024-08-05 (×3): qty 10

## 2024-08-05 MED ORDER — DIAZEPAM 1 MG/ML PO SOLN: 2.0000 mg | Freq: Three times a day (TID) | ORAL | Status: DC | PRN

## 2024-08-05 NOTE — Plan of Care (Signed)

## 2024-08-05 NOTE — Progress Notes (Signed)
 14 Days Post-Op   Subjective/Chief Complaint: Stools improved a bit with imodium .  Tolerating clears for a while now.   Objective: Vital signs in last 24 hours: Temp:  [97.5 F (36.4 C)-99.6 F (37.6 C)] 97.5 F (36.4 C) (09/23 0726) Pulse Rate:  [84-93] 91 (09/23 0726) Resp:  [17-20] 20 (09/23 0331) BP: (156-173)/(58-76) 173/66 (09/23 0726) SpO2:  [93 %-98 %] 96 % (09/23 0726) Last BM Date : 08/05/24  Intake/Output from previous day: 09/22 0701 - 09/23 0700 In: 1990 [P.O.:170; NG/GT:1800] Out: 585 [Urine:200; Drains:385] Intake/Output this shift: No intake/output data recorded.  General: alert, no distress Resp: No respiratory distress with normal rate CV: Regular rate and rhythm Abd:  soft, midline wound without any erythema or drainage. Some leakage around j tube and drains.   Ext:  SCDs in place  Lab Results:  Recent Labs    08/04/24 0740 08/05/24 0412  WBC 12.0* 10.7*  HGB 8.7* 8.7*  HCT 27.0* 28.0*  PLT 511* 507*    BMET Recent Labs    08/04/24 0740 08/05/24 0412  NA 142 139  K 3.8 4.0  CL 111 109  CO2 24 23  GLUCOSE 123* 179*  BUN 18 14  CREATININE 1.16* 1.19*  CALCIUM  7.3* 7.3*   PT/INR No results for input(s): LABPROT, INR in the last 72 hours.   ABG No results for input(s): PHART, HCO3 in the last 72 hours.  Invalid input(s): PCO2, PO2   Studies/Results: No results found.     Anti-infectives: Anti-infectives (From admission, onward)    Start     Dose/Rate Route Frequency Ordered Stop   08/02/24 1400  piperacillin -tazobactam (ZOSYN ) IVPB 3.375 g        3.375 g 12.5 mL/hr over 240 Minutes Intravenous Every 8 hours 08/01/24 2238     08/01/24 2330  cefTRIAXone  (ROCEPHIN ) injection 1 g       Placed in And Linked Group   1 g Intramuscular  Once 08/01/24 2238 08/02/24 0015   08/01/24 1800  vancomycin  (VANCOCIN ) IVPB 1000 mg/200 mL premix        1,000 mg 200 mL/hr over 60 Minutes Intravenous Every 12 hours 08/01/24  1642     08/01/24 1047  vancomycin  variable dose per unstable renal function (pharmacist dosing)  Status:  Discontinued         Does not apply See admin instructions 08/01/24 1047 08/01/24 1646   07/30/24 2300  vancomycin  (VANCOCIN ) IVPB 1000 mg/200 mL premix  Status:  Discontinued        1,000 mg 200 mL/hr over 60 Minutes Intravenous Every 12 hours 07/30/24 1029 08/01/24 1047   07/30/24 1400  piperacillin -tazobactam (ZOSYN ) IVPB 3.375 g  Status:  Discontinued        3.375 g 12.5 mL/hr over 240 Minutes Intravenous Every 8 hours 07/30/24 1225 08/01/24 2238   07/30/24 1115  fluconazole  (DIFLUCAN ) IVPB 400 mg        400 mg 100 mL/hr over 120 Minutes Intravenous Every 24 hours 07/30/24 1025     07/30/24 1100  vancomycin  (VANCOREADY) IVPB 2000 mg/400 mL        2,000 mg 200 mL/hr over 120 Minutes Intravenous  Once 07/30/24 1010 07/30/24 1540   07/28/24 0945  piperacillin -tazobactam (ZOSYN ) IVPB 3.375 g  Status:  Discontinued        3.375 g 12.5 mL/hr over 240 Minutes Intravenous Every 8 hours 07/28/24 0853 07/30/24 1131   07/23/24 0000  ceFAZolin  (ANCEF ) IVPB 2g/100 mL premix  2 g 200 mL/hr over 30 Minutes Intravenous Every 8 hours 07/22/24 2314 07/23/24 0058   07/22/24 0900  ceFAZolin  (ANCEF ) IVPB 2g/100 mL premix        2 g 200 mL/hr over 30 Minutes Intravenous To ShortStay Surgical 07/21/24 1938 07/22/24 1432       Assessment/Plan: s/p Procedure(s): WHIPPLE PROCEDURE (N/A) CREATION, JEJUNOSTOMY (N/A)- Lauren Gray 07/22/24 POD 14- 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  pT3bN1 final path.   Prn morphine , oxy, robaxin , tylenol  T bili down to normal.  AKI, creatinine stable ABL anemia, anemia chronic disease. 2 u PRBC on 9/9 Zosyn /fluc/vanc for panc leak and pneumonia.   Full liquids today.  Stay on this diet for several days at least.    TF now at goal. Continue fiber.  Continue imodium .  Will likely need pancreatic enzymes.  Limited utility  when continuous feeds and no liquid available.     PT/OT - she is working with therapies well, walking from bed to chair with modA +2  Defer management of blood sugar/HTN to medicine.       LOS: 32 days    Lauren LITTIE Nephew, MD, FACS, FSSO Surgical Oncology, General Surgery, Trauma and Critical [facility], [place] [phone] for weekday/non holidays Check [email or web address] for coverage night/weekend/holidays

## 2024-08-05 NOTE — Progress Notes (Signed)
   Inpatient Rehabilitation Admissions Coordinator   Met with patient, spouse and sister at bedside. Patient is hopeful for rehab prior to discharge home. Noted deferred OT today. On clear liquids. I await further progress/participation with therapy as well as medical readiness before I will pursue Auth with [employer] for a possible Cir admit.  Heron Leavell, RN, MSN Rehab Admissions Coordinator [phone] 08/05/2024 11:20 AM

## 2024-08-05 NOTE — PMR Pre-admission (Signed)
 PMR Admission Coordinator Pre-Admission Assessment  Patient: Lauren Gray is an 73 y.o., female MRN: [id number] DOB: June 08, 1951 Height: 5' 8 (172.7 cm) Weight: 121.2 kg  Insurance Information HMO: ***    PPO: ***     PCP: ***     IPA: ***     80/20: ***     OTHER: *** PRIMARY: [employer]      Policy#: [id number]      Subscriber: pt CM Name: ***      Phone#: ***     Fax#: *** Pre-Cert#: ***      Employer: *** Benefits:  Phone #: ***     Name: *** Eff. Date: ***     Deduct: ***      Out of Pocket Max: ***      Life Max: *** CIR: ***      SNF: *** Outpatient: ***     Co-Pay: *** Home Health: ***      Co-Pay: *** DME: ***     Co-Pay: *** Providers: in network  SECONDARY: Medicare Part A and B      Policy#: [id number]     Phone#: verified passport one source online 08/05/24  Financial Counselor:       Phone#:   The "Data Collection Information Summary" for patients in Inpatient Rehabilitation Facilities with attached "Privacy Act Statement-Health Care Records" was provided and verbally reviewed with: Patient and Family  Emergency Contact Information Contact Information     Name Relation Home Work Mobile   Mitchell Spouse [phone]        Other Contacts     Name Relation Home Work Mobile   [place] Daughter   [phone]      Current Medical History  Patient Admitting Diagnosis: Debility  History of Present Illness: 73 yo female with history of HLD, prediabetes, CKD II does not take meds on a regular basis, past surgical history of total hysterectomy and BSO who presented on 07/04/24 with vomiting for 1 month, only holding down crackers and yogurt, has not had a full BM in a few weeks, and weight loss of 10 lbs.   Found to have gastric outlet obstruction. 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 Underwent a Whipple procedure on 07/22/24 along with Jejunostomy. SABRA Postoperative with TNA. Postoperative pain management and mobilization. CT chest  showed evidence of bilateral pleural effusion likely third spacing in the setting of hypoalbuminemia. Repeat CXR showed persistent PNA. Underwent thoracocentesis with 250 ml fluid. Cytology negative for any malignant cells. 1 PRBC transfusion on 9/19 followed by 2 more PRBCs transfusions for postop anemia. TNA discontinued and tube feeds ongoing and at goal rate. Diet upgrades transitioning.   *** medical updates from 9/25***  Patient[facility] record from [facility] has been reviewed by the rehabilitation admission coordinator and physician.  Past Medical History  Past Medical History:  Diagnosis Date   Complication of anesthesia    Obesity    Scoliosis    Has the patient had major surgery during 100 days prior to admission? Yes  Family History   family history includes Aneurysm in her sister; Breast cancer (age of onset: 41) in her sister; Cancer in her mother; Diabetes in her father and paternal grandmother; Heart disease in her father.  Current Medications  Current Facility-Administered Medications:    amLODipine  (NORVASC ) tablet 10 mg, 10 mg, Oral, Daily, Danton Purchase T, MD, 10 mg at 08/13/24 1000   carvedilol (COREG) tablet 6.25 mg, 6.25 mg, Oral, BID WC,  Danton Reyes DASEN, MD, 6.25 mg at 08/13/24 0820   Chlorhexidine  Gluconate Cloth 2 % PADS 6 each, 6 each, Topical, Q0600, Aron Shoulders, MD, 6 each at 08/13/24 0515   cloNIDine  (CATAPRES ) tablet 0.1 mg, 0.1 mg, Oral, BID, Danton Reyes DASEN, MD, 0.1 mg at 08/13/24 1000   feeding supplement (OSMOLITE 1.5 CAL) liquid 1,260 mL, 1,260 mL, Per Tube, Q24H, Danton Reyes DASEN, MD   feeding supplement (PROSource TF20) liquid 60 mL, 60 mL, Per Tube, Daily, Hogan, Kendra Paige, PA-C, 60 mL at 08/13/24 1000   fiber supplement (BANATROL TF) liquid 60 mL, 60 mL, Per Tube, BID, Simaan, Elizabeth S, PA-C, 60 mL at 08/13/24 1000   fluticasone  (FLONASE ) 50 MCG/ACT nasal spray 2 spray, 2 spray, Each Nare, Daily, Byerly, Faera, MD, 2  spray at 08/13/24 1000   free water  100 mL, 100 mL, Per Tube, Q8H, Danton Reyes DASEN, MD, 100 mL at 08/13/24 0516   gabapentin  (NEURONTIN ) 250 MG/5ML solution 100 mg, 100 mg, Per Tube, Q8H, Tobie Yetta HERO, MD, 100 mg at 08/13/24 0517   Glycerin  (Adult) 2 g suppository 1 suppository, 1 suppository, Rectal, PRN, Aron Shoulders, MD   heparin  injection 5,000 Units, 5,000 Units, Subcutaneous, Q8H, Patel, Pranav M, MD, 5,000 Units at 08/13/24 0516   HYDROcodone-acetaminophen  (NORCO/VICODIN) 5-325 MG per tablet 1-2 tablet, 1-2 tablet, Oral, Q6H PRN, Laron Agent, RPH   insulin  aspart (novoLOG ) injection 0-15 Units, 0-15 Units, Subcutaneous, TID WC, Danton Reyes DASEN, MD, 3 Units at 08/13/24 1228   insulin  aspart (novoLOG ) injection 0-5 Units, 0-5 Units, Subcutaneous, QHS, Danton Reyes DASEN, MD, 2 Units at 08/12/24 2237   insulin  aspart (novoLOG ) injection 6 Units, 6 Units, Subcutaneous, TID WC, Danton Reyes DASEN, MD, 6 Units at 08/13/24 1229   insulin  glargine (LANTUS ) injection 10 Units, 10 Units, Subcutaneous, BID, Danton Reyes DASEN, MD, 10 Units at 08/13/24 1000   lidocaine  (XYLOCAINE ) 2 % viscous mouth solution 15 mL, 15 mL, Mouth/Throat, Q4H PRN, Aron Shoulders, MD   lipase/protease/amylase (CREON ) capsule 24,000 Units, 24,000 Units, Oral, TID AC, Aron Shoulders, MD, 24,000 Units at 08/13/24 1228   loperamide  (IMODIUM ) capsule 2 mg, 2 mg, Oral, TID, Byerly, Faera, MD, 2 mg at 08/13/24 1000   menthol -cetylpyridinium (CEPACOL) lozenge 3 mg, 1 lozenge, Oral, PRN, Aron Shoulders, MD   methocarbamol  (ROBAXIN ) injection 500 mg, 500 mg, Intravenous, Q8H, Byerly, Faera, MD, 500 mg at 08/13/24 0517   morphine  (PF) 2 MG/ML injection 2 mg, 2 mg, Intravenous, Q2H PRN, Danton Reyes DASEN, MD   ondansetron  (ZOFRAN ) injection 4 mg, 4 mg, Intravenous, Q4H PRN, Aron Shoulders, MD, 4 mg at 08/11/24 9378   Oral care mouth rinse, 15 mL, Mouth Rinse, PRN, Aron Shoulders, MD   pantoprazole  (PROTONIX ) EC tablet 40 mg, 40  mg, Oral, BID, Danton Reyes DASEN, MD, 40 mg at 08/13/24 1000   phenol (CHLORASEPTIC) mouth spray 1 spray, 1 spray, Mouth/Throat, PRN, Byerly, Faera, MD, 1 spray at 07/14/24 1957   promethazine  (PHENERGAN ) 12.5 mg in sodium chloride  0.9 % 50 mL IVPB, 12.5 mg, Intravenous, Q6H PRN, Danton Reyes DASEN, MD, Last Rate: 150 mL/hr at 08/10/24 1334, 12.5 mg at 08/10/24 1334   sodium chloride  flush (NS) 0.9 % injection 10-40 mL, 10-40 mL, Intracatheter, PRN, Aron Shoulders, MD   traMADol  (ULTRAM ) tablet 50 mg, 50 mg, Per Tube, Q12H PRN, Laron Agent, RPH   traZODone  (DESYREL ) tablet 50 mg, 50 mg, Per Tube, QHS, Patel, Pranav M, MD, 50 mg at 08/12/24 2236  Patients Current  Diet:  Diet Order             DIET SOFT Room service appropriate? Yes; Fluid consistency: Thin  Diet effective now                  Precautions / Restrictions Precautions Precautions: Fall, Other (comment) Precaution/Restrictions Comments: JP drainx2 (L), PEG tube Restrictions Weight Bearing Restrictions Per Provider Order: No   Has the patient had 2 or more falls or a fall with injury in the past year? No  Prior Activity Level Community (5-7x/wk): Independent without AD, drives, works remote for [employer]  Prior Functional Level Self Care: Did the patient need help bathing, dressing, using the toilet or eating? Independent  Indoor Mobility: Did the patient need assistance with walking from room to room (with or without device)? Independent  Stairs: Did the patient need assistance with internal or external stairs (with or without device)? Independent  Functional Cognition: Did the patient need help planning regular tasks such as shopping or remembering to take medications? Independent  Patient Information Are you of Hispanic, Latino/a,or Spanish origin?: A. No, not of Hispanic, Latino/a, or Spanish origin What is your race?: B. Black or African American Do you need or want an interpreter to  communicate with a doctor or health care staff?: 0. No  Patient's Response To:  Health Literacy and Transportation Is the patient able to respond to health literacy and transportation needs?: Yes Health Literacy - How often do you need to have someone help you when you read instructions, pamphlets, or other written material from your doctor or pharmacy?: Never In the past 12 months, has lack of transportation kept you from medical appointments or from getting medications?: No In the past 12 months, has lack of transportation kept you from meetings, work, or from getting things needed for daily living?: No  Home Assistive Devices / Equipment Home Equipment: Agricultural consultant (2 wheels), [employer] - single point, Hand held shower head, Grab bars - tub/shower  Prior Device Use: Indicate devices/aids used by the patient prior to current illness, exacerbation or injury? None of the above  Current Functional Level Cognition  Orientation Level: Oriented X4    Extremity Assessment (includes Sensation/Coordination)  Upper Extremity Assessment: Generalized weakness  Lower Extremity Assessment: Defer to PT evaluation    ADLs  Overall ADL's : Needs assistance/impaired Eating/Feeding: NPO Grooming: Set up, Sitting Upper Body Bathing: Moderate assistance Lower Body Bathing: Moderate assistance Upper Body Dressing : Moderate assistance Lower Body Dressing: Minimal assistance Lower Body Dressing Details (indicate cue type and reason): for R sock Toilet Transfer: Minimal assistance, Ambulation, Rolling walker (2 wheels) Toilet Transfer Details (indicate cue type and reason): Short distance simulated in room Toileting- Clothing Manipulation and Hygiene: Moderate assistance Functional mobility during ADLs: Contact guard assist, Minimal assistance, Rolling walker (2 wheels) General ADL Comments: Pt limited by fear and poor balance    Mobility  Overal bed mobility: Needs Assistance Bed Mobility: Rolling,  Sit to Sidelying Rolling: Contact guard assist Sidelying to sit: Contact guard assist, Used rails Supine to sit: Min assist Sit to supine: Mod assist Sit to sidelying: Min assist, Used rails General bed mobility comments: OOB in chair    Transfers  Overall transfer level: Needs assistance Equipment used: Rolling walker (2 wheels) Transfers: Sit to/from Stand Sit to Stand: Min assist Bed to/from chair/wheelchair/BSC transfer type:: Step pivot Step pivot transfers: +2 safety/equipment, Contact guard assist General transfer comment: Cues for rocking to gain momentum, minA to  power up to stand    Ambulation / Gait / Stairs / Wheelchair Mobility  Ambulation/Gait Ambulation/Gait assistance: Contact guard assist Gait Distance (Feet): 25 Feet Assistive device: Rolling walker (2 wheels) Gait Pattern/deviations: Step-through pattern, Decreased stride length, Trunk flexed General Gait Details: Slow, but overall steady pace, pulled chair behind her in case she needed to sit but did not utilize. CGA for safety Gait velocity: Decreased Gait velocity interpretation: <1.8 ft/sec, indicate of risk for recurrent falls Pre-gait activities: pt taking steps from EOB to recliner for ~ 4 ft with reciprocal gait pattern with short step length and mid foot initial contact. Moderate UE support on RW and CGA for safety    Posture / Balance Dynamic Sitting Balance Sitting balance - Comments: Able to support self with UE sitting EOB Balance Overall balance assessment: Needs assistance Sitting-balance support: Feet supported, Single extremity supported Sitting balance-Leahy Scale: Fair Sitting balance - Comments: Able to support self with UE sitting EOB Standing balance support: During functional activity, Bilateral upper extremity supported, Reliant on assistive device for balance Standing balance-Leahy Scale: Poor Standing balance comment: Reliant on RW    Special considerations/life events  Jejunostomy  9/9 18 fr LUQ   Previous Home Environment  Living Arrangements: Spouse/significant other (daughter Cassius)  Lives With: Spouse, Daughter Available Help at Discharge: Family, Available 24 hours/day Type of Home: House Home Layout: Multi-level Alternate Level Stairs-Rails: Can reach both Alternate Level Stairs-Number of Steps: 5 + 6 to get to main level Home Access: Level entry Bathroom Shower/Tub: Engineer, manufacturing systems: Handicapped height Bathroom Accessibility: Yes Home Care Services: No  Discharge Living Setting Plans for Discharge Living Setting: Patient's home, Lives with (comment) (spouse and daughter, Cassius) Type of Home at Discharge: House Discharge Home Layout: Multi-level Alternate Level Stairs-Rails: Can reach both, Right, Left Alternate Level Stairs-Number of Steps: 5 to 6 to get to main level Discharge Home Access: Level entry Discharge Bathroom Shower/Tub: Tub/shower unit Discharge Bathroom Toilet: Handicapped height Discharge Bathroom Accessibility: Yes How Accessible: Accessible via walker Does the patient have any problems obtaining your medications?: No  Social/Family/Support Systems Patient Roles: Spouse, Parent (employee) Contact Information: spouse and daughter Anticipated Caregiver: spouse and daughter Anticipated Caregiver's Contact Information: see contacts Ability/Limitations of Caregiver: spouse uses cane, daughter works, has sister Heron also Caregiver Availability: 24/7 Discharge Plan Discussed with Primary Caregiver: Yes Is Caregiver In Agreement with Plan?: Yes Does Caregiver/Family have Issues with Lodging/Transportation while Pt is in Rehab?: No  Goals Patient/Family Goal for Rehab: supervision PT, supervision to min OT Expected length of stay: ELOS 10 to 14 days Pt/Family Agrees to Admission and willing to participate: Yes Program Orientation Provided & Reviewed with Pt/Caregiver Including Roles  & Responsibilities:  Yes  Decrease burden of Care through IP rehab admission: n/a  Possible need for SNF placement upon discharge: not anticipated  Patient Condition: I have reviewed medical records from [facility], spoken with  patient, spouse, daughter, and family member. I met with patient at the bedside and discussed via phone for inpatient rehabilitation assessment.  Patient will benefit from ongoing PT and OT, can actively participate in 3 hours of therapy a day 5 days of the week, and can make measurable gains during the admission.  Patient will also benefit from the coordinated team approach during an Inpatient Acute Rehabilitation admission.  The patient will receive intensive therapy as well as Rehabilitation physician, nursing, social worker, and care management interventions.  Due to bladder management, bowel management, safety, skin/wound care,  disease management, medication administration, pain management, and patient education the patient requires 24 hour a day rehabilitation nursing.  The patient is currently *** with mobility and basic ADLs.  Discharge setting and therapy post discharge at home with home health is anticipated.  Patient has agreed to participate in the Acute Inpatient Rehabilitation Program and will admit {Time; today/tomorrow:10263}.  Preadmission Screen Completed By:  Alison Heron Lot, RN MSN 08/13/2024 1:09 PM ______________________________________________________________________   Discussed status with Dr. PIERRETTE on *** at *** and received approval for admission today.  Admission Coordinator:  Alison Heron Lot, RN MSN time PIERRETTEPattricia ***   Assessment/Plan: Diagnosis: *** Does the need for close, 24 hr/day Medical supervision in concert with the patient's rehab needs make it unreasonable for this patient to be served in a less intensive setting? {yes_no_potentially:3041433} Co-Morbidities requiring supervision/potential complications: *** Due to {due un:6958565},  does the patient require 24 hr/day rehab nursing? {yes_no_potentially:3041433} Does the patient require coordinated care of a physician, rehab nurse, PT, OT, and SLP to address physical and functional deficits in the context of the above medical diagnosis(es)? {yes_no_potentially:3041433} Addressing deficits in the following areas: {deficits:3041436} Can the patient actively participate in an intensive therapy program of at least 3 hrs of therapy 5 days a week? {yes_no_potentially:3041433} The potential for patient to make measurable gains while on inpatient rehab is {potential:3041437} Anticipated functional outcomes upon discharge from inpatient rehab: {functional outcomes:304600100} PT, {functional outcomes:304600100} OT, {functional outcomes:304600100} SLP Estimated rehab length of stay to reach the above functional goals is: *** Anticipated discharge destination: {anticipated dc setting:21604} 10. Overall Rehab/Functional Prognosis: {potential:3041437}   MD Signature: ***

## 2024-08-05 NOTE — Progress Notes (Signed)
 OT Cancellation Note  Patient Details Name: KHARA RENAUD MRN: [id number] DOB: 1951/09/11   Cancelled Treatment:    Reason Eval/Treat Not Completed: Patient declined, no reason specified;Other (comment) (not a good time for her). Explained to pt, OT will not be able to return later today due to schedule but will return tomorrow. Pt states she does things at her pace and she was not in a place to get up and move about right now.  Joshua Silvano Dragon 08/05/2024, 11:06 AM

## 2024-08-05 NOTE — Progress Notes (Signed)
 [facility] Progress Note Patient: Lauren Gray FMW:[id number] DOB: 08-05-1951  DOA: 07/04/2024 DOS: the patient was seen and examined on 08/05/2024  Brief Hospital Course: 73 yo female presented 07/05/2023 due to gastric outlet obstruction with findings of underlying duodenal cancer.  The patient underwent a Whipple procedure on 07/22/2024.   Assessment and Plan: Duodenal adenocarcinoma with gastric outlet obstruction Status post Whipple's procedure and jejunostomy creation. CT scan showed dilated 1st and 2nd portion of duodenum with possible underlying mass. GI was consulted, 8/24 underwent small bowel enteroscopy. Duodenal mass biopsy was positive for adenocarcinoma. General surgery was consulted. Due to poor nutritional status from the mass patient was started on TPN first. Surgery (Whipple procedure) was performed on 07/22/2024.  Was on Dilaudid  PCA CT abdomen and chest on 9/17 shows no new changes. Due to worsening leukocytosis antibiotic regimen-broaden. Would recommend 7-day therapy only with EOT for antibiotic in 9/24 but defer to surgery. Management per surgery.  Bilateral pleural effusion. Multifocal pneumonia Reports of shortness of breath. CT chest negative for PE although altered study was not designed for PE. CT chest shows evidence of bilateral pleural effusion likely third spacing in the setting of hypoalbuminemia. Repeat chest x-ray shows persistent pneumonia. Underwent thoracentesis.  250 mL fluid.  No evidence of hemothorax.  Appears to be exudative fluid based on LDH. Cytology negative for any malignant cells.  Cultures so far negative as well.  Continue incentive spirometry.  Normocytic anemia postop blood loss anemia with a combination of dilution from IV fluids Transfuse hemoglobin less than 7 or hemodynamic instability. Iron level normal.  Folic acid within range.  B12 normal.  Smear does not show any evidence of exercise.  Reticulocyte count elevated but CT  abdomen negative for any acute blood loss.  No hemothorax on thoracentesis. 1 PRBC transfusion on 9/19 followed by 2 more PRBC transfusion. Now hemoglobin stable.   Nutrition Currently at goal rate for tube feeds. TPN discontinued.  Full liquid diet.   Hypokalemia/hypophosphatemia Resolved.    Essential hypertension Blood pressure somewhat elevated.  Will add Norvasc .  Continue metoprolol .  Continue clonidine  patch.   Prediabetes with hyperglycemia A1c 6.1. Continue sliding scale insulin  only for now for tube feed coverage.  Also on Lantus    AKI on CKD 2 Treated with IV fluid.  Now stopped. Baseline creatinine normal.  On admission serum creatinine was 1.35.  Creatinine fluctuating.  Monitor.   Mild elevated lipase Resolved  Anxiety and insomnia. Trazodone  at nighttime and Valium  as needed.  Obesity Class 3 Body mass index is 41.57 kg/m.  Placing the pt at higher risk of poor outcomes.   Hypernatremia. Continue free water .  Reducing the rate. Monitor.  Thrombocytosis. Likely stress reaction. Monitor.  LFT elevation. Resolving.   Bilateral edema. Likely third spacing. Compression stocking ordered. SCD recommended.  Started on DVT prophylaxis again on 9/23. This is on hold secondary to anemia. Monitor closely.  Subjective: No nausea no vomiting no fever no chills.  Denies any other complaint.  Physical Exam: Basal crackles. S1-S2 present Bowel sound present. Bilateral edema.  Data Reviewed: I have Reviewed nursing notes, Vitals, and Lab results. Since last encounter, pertinent lab results CBC and BMP   . I have ordered test including CBC and BMP  .    Disposition: Status is: Inpatient Remains inpatient appropriate because: Monitor for improvement in oral intake and postop care  heparin  injection 5,000 Units Start: 08/05/24 0600 SCD's Start: 07/22/24 2315 SCDs Start: 07/04/24 2321  Family Communication: Family  at bedside Level of care:  Progressive   Vitals:   08/05/24 0331 08/05/24 0726 08/05/24 1203 08/05/24 1536  BP: (!) 160/70 (!) 173/66 (!) 157/85 (!) 152/54  Pulse: 93 91 83 83  Resp: 20     Temp: 98.1 F (36.7 C) (!) 97.5 F (36.4 C) 97.7 F (36.5 C) 97.6 F (36.4 C)  TempSrc: Oral Oral Oral Oral  SpO2: 93% 96% 97% 97%  Weight:      Height:         Author: Yetta Blanch, MD 08/05/2024 8:02 PM  Please look on [email or web address] to find out who is on call.

## 2024-08-06 DIAGNOSIS — K56609 Unspecified intestinal obstruction, unspecified as to partial versus complete obstruction: Secondary | ICD-10-CM | POA: Diagnosis not present

## 2024-08-06 LAB — CBC WITH DIFFERENTIAL/PLATELET
Abs Immature Granulocytes: 0.04 K/uL (ref 0.00–0.07)
Basophils Absolute: 0 K/uL (ref 0.0–0.1)
Basophils Relative: 0 %
Eosinophils Absolute: 0.5 K/uL (ref 0.0–0.5)
Eosinophils Relative: 6 %
HCT: 27.3 % — ABNORMAL LOW (ref 36.0–46.0)
Hemoglobin: 8.8 g/dL — ABNORMAL LOW (ref 12.0–15.0)
Immature Granulocytes: 1 %
Lymphocytes Relative: 15 %
Lymphs Abs: 1.3 K/uL (ref 0.7–4.0)
MCH: 29.6 pg (ref 26.0–34.0)
MCHC: 32.2 g/dL (ref 30.0–36.0)
MCV: 91.9 fL (ref 80.0–100.0)
Monocytes Absolute: 0.4 K/uL (ref 0.1–1.0)
Monocytes Relative: 4 %
Neutro Abs: 6.6 K/uL (ref 1.7–7.7)
Neutrophils Relative %: 74 %
Platelets: 520 K/uL — ABNORMAL HIGH (ref 150–400)
RBC: 2.97 MIL/uL — ABNORMAL LOW (ref 3.87–5.11)
RDW: 16.7 % — ABNORMAL HIGH (ref 11.5–15.5)
WBC: 8.9 K/uL (ref 4.0–10.5)
nRBC: 0.2 % (ref 0.0–0.2)

## 2024-08-06 LAB — GLUCOSE, CAPILLARY
Glucose-Capillary: 112 mg/dL — ABNORMAL HIGH (ref 70–99)
Glucose-Capillary: 115 mg/dL — ABNORMAL HIGH (ref 70–99)
Glucose-Capillary: 144 mg/dL — ABNORMAL HIGH (ref 70–99)
Glucose-Capillary: 149 mg/dL — ABNORMAL HIGH (ref 70–99)
Glucose-Capillary: 157 mg/dL — ABNORMAL HIGH (ref 70–99)
Glucose-Capillary: 161 mg/dL — ABNORMAL HIGH (ref 70–99)
Glucose-Capillary: 163 mg/dL — ABNORMAL HIGH (ref 70–99)

## 2024-08-06 LAB — COMPREHENSIVE METABOLIC PANEL WITH GFR
ALT: 29 U/L (ref 0–44)
AST: 17 U/L (ref 15–41)
Albumin: <1.5 g/dL — ABNORMAL LOW (ref 3.5–5.0)
Alkaline Phosphatase: 98 U/L (ref 38–126)
Anion gap: 6 (ref 5–15)
BUN: 11 mg/dL (ref 8–23)
CO2: 23 mmol/L (ref 22–32)
Calcium: 7.3 mg/dL — ABNORMAL LOW (ref 8.9–10.3)
Chloride: 108 mmol/L (ref 98–111)
Creatinine, Ser: 0.98 mg/dL (ref 0.44–1.00)
GFR, Estimated: >60 mL/min (ref >60)
Glucose, Bld: 130 mg/dL — ABNORMAL HIGH (ref 70–99)
Potassium: 3.9 mmol/L (ref 3.5–5.1)
Sodium: 137 mmol/L (ref 135–145)
Total Bilirubin: 0.8 mg/dL (ref 0.0–1.2)
Total Protein: 5.8 g/dL — ABNORMAL LOW (ref 6.5–8.1)

## 2024-08-06 LAB — VANCOMYCIN, RANDOM: Vancomycin Rm: 14 ug/mL

## 2024-08-06 LAB — MAGNESIUM: Magnesium: 1.7 mg/dL (ref 1.7–2.4)

## 2024-08-06 MED ORDER — METOPROLOL TARTRATE 25 MG/10 ML ORAL SUSPENSION
50.0000 mg | Freq: Two times a day (BID) | ORAL | Status: DC
  Administered 2024-08-06 – 2024-08-08 (×4): 50 mg
  Filled 2024-08-06 (×4): qty 20

## 2024-08-06 MED ORDER — OSMOLITE 1.5 CAL PO LIQD
1000.0000 mL | ORAL | Status: DC
  Filled 2024-08-06 (×2): qty 1000

## 2024-08-06 MED ORDER — VANCOMYCIN HCL IN DEXTROSE 1-5 GM/200ML-% IV SOLN
1000.0000 mg | Freq: Once | INTRAVENOUS | Status: DC
  Filled 2024-08-06: qty 200

## 2024-08-06 MED ORDER — VANCOMYCIN HCL IN DEXTROSE 1-5 GM/200ML-% IV SOLN
1000.0000 mg | Freq: Two times a day (BID) | INTRAVENOUS | Status: AC
Start: 2024-08-06 — End: 2024-08-09
  Administered 2024-08-07 – 2024-08-09 (×7): 1000 mg via INTRAVENOUS
  Filled 2024-08-06 (×7): qty 200

## 2024-08-06 MED ORDER — DIAZEPAM 1 MG/ML PO SOLN: 2.0000 mg | Freq: Three times a day (TID) | ORAL | Status: DC | PRN

## 2024-08-06 MED ORDER — AMLODIPINE BESYLATE 10 MG PO TABS
10.0000 mg | ORAL_TABLET | Freq: Every day | ORAL | Status: DC
  Administered 2024-08-07 – 2024-08-11 (×5): 10 mg via ORAL
  Filled 2024-08-06 (×5): qty 1

## 2024-08-06 MED ORDER — PANCRELIPASE (LIP-PROT-AMYL) 12000-38000 UNITS PO CPEP
24000.0000 [IU] | ORAL_CAPSULE | Freq: Three times a day (TID) | ORAL | Status: DC
  Administered 2024-08-08 – 2024-08-15 (×18): 24000 [IU] via ORAL
  Filled 2024-08-06 (×30): qty 2

## 2024-08-06 MED ORDER — VANCOMYCIN HCL IN DEXTROSE 1-5 GM/200ML-% IV SOLN: 1000.0000 mg | Freq: Once | INTRAVENOUS | Status: DC

## 2024-08-06 NOTE — Progress Notes (Signed)
 Occupational Therapy Treatment Patient Details Name: Lauren Gray MRN: [id number] DOB: [birth year] Today's Date: 08/06/2024   History of present illness Pt is a 73 y/o female presenting with 1 month of nausea/vomiting. CT abdomen showed SBO from underlying mass. Mass biopsy showed + invasive adenocarcinoma. Requiring TPN. S/p pancreaticoduodenectomy, pancreatic duct stent placement, J-tube placement 9/9. PMH: hypokalemia, HTN, CKD, prediabetes   OT comments  Pt progressing well towards goals. Pt anxious but agreeable to progress mobility. Progressed toilet transfer to min assist with +2 for safety with lines. During mobility pt reporting lightheadedness and requesting to lay down. VSS on 3L. Encouraged pt to continue to progress each day with therapy, pt in agreeance to progress with therapy is sessions can be more scheduled and structured. Pt still motivated to progress and would benefit from structured intensive  >3 hours of skilled rehab daily . Will continue to follow acutely.       If plan is discharge home, recommend the following:  A lot of help with bathing/dressing/bathroom;Assistance with cooking/housework;Direct supervision/assist for medications management;Direct supervision/assist for financial management;Assist for transportation;Help with stairs or ramp for entrance;A lot of help with walking and/or transfers   Equipment Recommendations  BSC/3in1;Tub/shower seat    Recommendations for Other Services PT consult;Rehab consult    Precautions / Restrictions Precautions Precautions: Fall;Other (comment) Precaution/Restrictions Comments: JP drainx2 (L) Restrictions Weight Bearing Restrictions Per Provider Order: No       Mobility Bed Mobility Overal bed mobility: Needs Assistance Bed Mobility: Sidelying to Sit, Rolling, Sit to Sidelying Rolling: Contact guard assist Sidelying to sit: Min assist, Used rails     Sit to sidelying: Min assist, Used rails General bed  mobility comments: Cueing to sequence log rolling, assist for trunk to supine, min assist to return lower legs to bed    Transfers Overall transfer level: Needs assistance Equipment used: Rolling walker (2 wheels) Transfers: Sit to/from Stand Sit to Stand: Min assist, +2 safety/equipment, +2 physical assistance           General transfer comment: Min assist +2 to stand. Once in standing pt largely CGA for gait. Use of pillow to brace abdomen     Balance Overall balance assessment: Needs assistance Sitting-balance support: Feet supported, Single extremity supported Sitting balance-Leahy Scale: Fair     Standing balance support: During functional activity, Bilateral upper extremity supported, Reliant on assistive device for balance Standing balance-Leahy Scale: Poor Standing balance comment: Reliant on RW       ADL either performed or assessed with clinical judgement   ADL Overall ADL's : Needs assistance/impaired     Lower Body Dressing: Moderate assistance Lower Body Dressing Details (indicate cue type and reason): Assist for socks Toilet Transfer: Minimal assistance;+2 for safety/equipment;Rolling walker (2 wheels);Ambulation Toilet Transfer Details (indicate cue type and reason): Short distance simulated in room         Functional mobility during ADLs: Minimal assistance;+2 for physical assistance;Rolling walker (2 wheels) General ADL Comments: Pt limited by fear and poor balance    Extremity/Trunk Assessment Upper Extremity Assessment Upper Extremity Assessment: Generalized weakness   Lower Extremity Assessment Lower Extremity Assessment: Defer to PT evaluation        Vision   Vision Assessment?: No apparent visual deficits         Communication Communication Communication: No apparent difficulties   Cognition Arousal: Alert Behavior During Therapy: Anxious Cognition: No apparent impairments             OT - Cognition Comments: Pt  anxious  regarding all mobility, requires max encouragement                 Following commands: Intact        Cueing   Cueing Techniques: Verbal cues        General Comments Pt reporting feeling lightheaded during mobility, but VSS on 3L O2    Pertinent Vitals/ Pain       Pain Assessment Pain Assessment: Faces Faces Pain Scale: Hurts little more Pain Location: Abdomen with activity Pain Descriptors / Indicators: Grimacing, Aching Pain Intervention(s): Limited activity within patient's tolerance   Frequency  Min 2X/week        Progress Toward Goals  OT Goals(current goals can now be found in the care plan section)  Progress towards OT goals: Progressing toward goals  Acute Rehab OT Goals Patient Stated Goal: To get rest OT Goal Formulation: With patient/family Time For Goal Achievement: 08/12/24 Potential to Achieve Goals: Good ADL Goals Pt Will Perform Grooming: with set-up;standing;sitting Pt Will Perform Lower Body Bathing: with modified independence;sitting/lateral leans;sit to/from stand Pt Will Perform Upper Body Dressing: with set-up;sitting;standing Pt Will Perform Lower Body Dressing: with set-up;sitting/lateral leans;sit to/from stand Pt Will Transfer to Toilet: with set-up;ambulating;regular height toilet Additional ADL Goal #1: Pt to verbalize at least 3 energy conservation strategies to implement at home  Plan         AM-PAC OT 6 Clicks Daily Activity     Outcome Measure   Help from another person eating meals?: A Little Help from another person taking care of personal grooming?: A Little Help from another person toileting, which includes using toliet, bedpan, or urinal?: A Lot Help from another person bathing (including washing, rinsing, drying)?: A Lot Help from another person to put on and taking off regular upper body clothing?: A Little Help from another person to put on and taking off regular lower body clothing?: A Lot 6 Click Score: 15     End of Session Equipment Utilized During Treatment: Rolling walker (2 wheels);Oxygen  OT Visit Diagnosis: Unsteadiness on feet (R26.81);Other abnormalities of gait and mobility (R26.89);Muscle weakness (generalized) (M62.81)   Activity Tolerance Patient limited by pain   Patient Left in bed;with call bell/phone within reach;with nursing/sitter in room   Nurse Communication Mobility status        Time: 8550-8480 OT Time Calculation (min): 30 min  Charges: OT General Charges $OT Visit: 1 Visit OT Treatments $Self Care/Home Management : 8-22 mins  Adrianne BROCKS, OT  Acute Rehabilitation Services Office [phone] Secure chat preferred   Adrianne GORMAN Savers 08/06/2024, 3:39 PM

## 2024-08-06 NOTE — Progress Notes (Signed)
 Lauren Gray  FMW:[id number] DOB: 10/11/1951 DOA: 07/04/2024 PCP: Juanice Thomes SAUNDERS, FNP    Brief Narrative:  73 year old with a history of HLD, pre-DM, and CKD stage III who presented to the hospital 07/04/2024 with a 1 month history of persistent vomiting with inability to tolerate anything other than a small volume of yogurt or crackers.  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  Following admission and an extensive evaluation she underwent a Whipple procedure 07/22/2024.  Goals of Care:   Code Status: Full Code   DVT prophylaxis: heparin  injection 5,000 Units Start: 08/05/24 0600 SCD's Start: 07/22/24 2315 SCDs Start: 07/04/24 2321   Interim Hx: Afebrile.  Blood pressure elevated with systolics 147-176.  CBG at goal with range of 112-189.  In good spirits today.  Quite pleasant.  Denies any new complaints.  Is very determined and has a great outlook.  Assessment & Plan:  Duodenal adenocarcinoma with gastric outlet obstruction CT scan showed dilated 1st and 2nd portion of duodenum with possible underlying mass - GI was consulted, 8/24 underwent small bowel enteroscopy - duodenal mass biopsy was positive for adenocarcinoma - due to poor nutritional status patient was started on TPN - Whipple procedure performed 07/22/2024 - CT abdomen and chest on 9/17 shows no new changes   Bilateral pleural effusion - Multifocal pneumonia CT chest shows evidence of bilateral pleural effusion likely third spacing in the setting of hypoalbuminemia - Underwent thoracentesis yielding 250 mL fluid > exudative based on LDH - Cytology negative for any malignant cells   Normocytic anemia / postop blood loss anemia with a combination of dilution from IV fluids Iron level normal.  Folic acid within range.  B12 normal.  Reticulocyte count elevated but CT abdomen negative for any acute blood loss.  No hemothorax on thoracentesis. 1 PRBC transfusion on 9/19 followed  by 2 more PRBC transfusion - Hgb stable presently   Essential hypertension Adjust medical therapy today and continue to follow  Prediabetes with hyperglycemia A1c 6.1 -CBG controlled   AKI on CKD 2 Baseline creatinine normal - admission serum creatinine was 1.35 -creatinine has normalized again with support   Anxiety and insomnia Trazodone  at nighttime and Valium  as needed  Obesity Class 3 - Body mass index is 40.56 kg/m.   Hypernatremia. Continue free water    LFT elevation Has resolved with supportive care   Bilateral edema third spacing - compression stocking ordered - SCD recommended -mild on exam today    Family Communication: Spoke with husband at bedside Disposition:   Acute Inpatient Rehab (3hours/Day)08/04/2024 1326  Objective: Blood pressure (!) 176/57, pulse 90, temperature 98.1 F (36.7 C), temperature source Oral, resp. rate 17, height 5' 8 (1.727 m), weight 121 kg, SpO2 98%.  Intake/Output Summary (Last 24 hours) at 08/06/2024 1037 Last data filed at 08/06/2024 0755 Gross per 24 hour  Intake 1700 ml  Output 1190 ml  Net 510 ml   Filed Weights   08/02/24 0600 08/04/24 0222 08/06/24 0418  Weight: 123 kg 124 kg 121 kg    Examination: General: No acute respiratory distress Lungs: Clear to auscultation bilaterally without wheezes or crackles Cardiovascular: Regular rate and rhythm without murmur gallop or rub normal S1 and S2 Abdomen: Nontender, nondistended, soft, bowel sounds positive, no rebound, no ascites, no appreciable mass Extremities: No significant cyanosis, clubbing, or edema bilateral lower extremities  CBC: Recent Labs  Lab 08/04/24 0740 08/05/24 0412 08/06/24 0600  WBC 12.0*  10.7* 8.9  NEUTROABS 9.2* 8.3* 6.6  HGB 8.7* 8.7* 8.8*  HCT 27.0* 28.0* 27.3*  MCV 94.1 93.3 91.9  PLT 511* 507* 520*   Basic Metabolic Panel: Recent Labs  Lab 07/31/24 0609 08/01/24 0548 08/04/24 0740 08/05/24 0412 08/06/24 0600  NA 140   < > 142 139  137  K 4.0   < > 3.8 4.0 3.9  CL 100   < > 111 109 108  CO2 26   < > 24 23 23   GLUCOSE 216*   < > 123* 179* 130*  BUN 28*   < > 18 14 11   CREATININE 0.95   < > 1.16* 1.19* 0.98  CALCIUM  7.7*   < > 7.3* 7.3* 7.3*  MG 2.2   < > 1.9 1.9 1.7  PHOS 3.2  --   --   --   --    < > = values in this interval not displayed.   GFR: Estimated Creatinine Clearance: 70 mL/min (by C-G formula based on SCr of 0.98 mg/dL).   Scheduled Meds:  amLODipine   5 mg Oral Daily   Chlorhexidine  Gluconate Cloth  6 each Topical Q0600   cloNIDine   0.1 mg Transdermal Q Thu   feeding supplement (PROSource TF20)  60 mL Per Tube Daily   fiber supplement (BANATROL TF)  60 mL Per Tube BID   fluticasone   2 spray Each Nare Daily   free water   200 mL Per Tube Q4H   gabapentin   100 mg Per Tube Q8H   heparin  injection (subcutaneous)  5,000 Units Subcutaneous Q8H   insulin  aspart  0-20 Units Subcutaneous Q4H   insulin  aspart  5 Units Subcutaneous Q4H   insulin  glargine  10 Units Subcutaneous Daily   loperamide   2 mg Oral TID   methocarbamol  (ROBAXIN ) injection  500 mg Intravenous Q8H   metoprolol  tartrate  25 mg Per Tube BID   pantoprazole  (PROTONIX ) IV  40 mg Intravenous Q12H   traZODone   50 mg Per Tube QHS   Continuous Infusions:  feeding supplement (OSMOLITE 1.5 CAL) 1,000 mL (08/06/24 0957)   fluconazole  (DIFLUCAN ) IV 400 mg (08/06/24 0932)   piperacillin -tazobactam (ZOSYN )  IV 3.375 g (08/06/24 9366)   vancomycin  1,000 mg (08/05/24 1635)     LOS: 33 days   Reyes IVAR Moores, MD [facility] Office  [phone] Pager - Text Page per Tracey  If 7PM-7AM, please contact night-coverage per Amion 08/06/2024, 10:37 AM

## 2024-08-06 NOTE — Progress Notes (Addendum)
 15 Days Post-Op   Subjective/Chief Complaint: Stools continue to improve a little.  Staples removed yesterday.  Had some drainage from lower portion of wound.    Started full liquids yesterday.  No n/v.  Had some yogurt and orange juice.    Objective: Vital signs in last 24 hours: Temp:  [97.6 F (36.4 C)-98.5 F (36.9 C)] 98.1 F (36.7 C) (09/24 0718) Pulse Rate:  [83-90] 90 (09/24 0400) Resp:  [17-20] 17 (09/24 0718) BP: (147-176)/(54-74) 176/57 (09/24 0918) SpO2:  [95 %-99 %] 98 % (09/24 0400) Weight:  [878 kg] 121 kg (09/24 0418) Last BM Date : 08/06/24  Intake/Output from previous day: 09/23 0701 - 09/24 0700 In: 1800 [NG/GT:1700] Out: 870 [Urine:450; Drains:420] Intake/Output this shift: Total I/O In: 400 [NG/GT:400] Out: 520 [Urine:400; Drains:120]  General: alert, no distress Resp: No respiratory distress with normal rate CV: Regular rate and rhythm Abd:  soft, midline wound without any erythema or drainage at the upper portion.  Lower portion with some drainage. Drain #2 with scant drainage, but purulent. Some leakage around j tube and drains.   Ext:  SCDs in place  Lab Results:  Recent Labs    08/05/24 0412 08/06/24 0600  WBC 10.7* 8.9  HGB 8.7* 8.8*  HCT 28.0* 27.3*  PLT 507* 520*    BMET Recent Labs    08/05/24 0412 08/06/24 0600  NA 139 137  K 4.0 3.9  CL 109 108  CO2 23 23  GLUCOSE 179* 130*  BUN 14 11  CREATININE 1.19* 0.98  CALCIUM  7.3* 7.3*   PT/INR No results for input(s): LABPROT, INR in the last 72 hours.   ABG No results for input(s): PHART, HCO3 in the last 72 hours.  Invalid input(s): PCO2, PO2   Studies/Results: No results found.     Anti-infectives: Anti-infectives (From admission, onward)    Start     Dose/Rate Route Frequency Ordered Stop   08/07/24 0000  vancomycin  (VANCOCIN ) IVPB 1000 mg/200 mL premix        1,000 mg 200 mL/hr over 60 Minutes Intravenous  Once 08/06/24 1106     08/06/24 1200   vancomycin  (VANCOCIN ) IVPB 1000 mg/200 mL premix  Status:  Discontinued        1,000 mg 200 mL/hr over 60 Minutes Intravenous  Once 08/06/24 1105 08/06/24 1106   08/02/24 1400  piperacillin -tazobactam (ZOSYN ) IVPB 3.375 g        3.375 g 12.5 mL/hr over 240 Minutes Intravenous Every 8 hours 08/01/24 2238     08/01/24 2330  cefTRIAXone  (ROCEPHIN ) injection 1 g       Placed in And Linked Group   1 g Intramuscular  Once 08/01/24 2238 08/02/24 0015   08/01/24 1800  vancomycin  (VANCOCIN ) IVPB 1000 mg/200 mL premix        1,000 mg 200 mL/hr over 60 Minutes Intravenous Every 12 hours 08/01/24 1642     08/01/24 1047  vancomycin  variable dose per unstable renal function (pharmacist dosing)  Status:  Discontinued         Does not apply See admin instructions 08/01/24 1047 08/01/24 1646   07/30/24 2300  vancomycin  (VANCOCIN ) IVPB 1000 mg/200 mL premix  Status:  Discontinued        1,000 mg 200 mL/hr over 60 Minutes Intravenous Every 12 hours 07/30/24 1029 08/01/24 1047   07/30/24 1400  piperacillin -tazobactam (ZOSYN ) IVPB 3.375 g  Status:  Discontinued        3.375 g 12.5 mL/hr over 240  Minutes Intravenous Every 8 hours 07/30/24 1225 08/01/24 2238   07/30/24 1115  fluconazole  (DIFLUCAN ) IVPB 400 mg        400 mg 100 mL/hr over 120 Minutes Intravenous Every 24 hours 07/30/24 1025     07/30/24 1100  vancomycin  (VANCOREADY) IVPB 2000 mg/400 mL        2,000 mg 200 mL/hr over 120 Minutes Intravenous  Once 07/30/24 1010 07/30/24 1540   07/28/24 0945  piperacillin -tazobactam (ZOSYN ) IVPB 3.375 g  Status:  Discontinued        3.375 g 12.5 mL/hr over 240 Minutes Intravenous Every 8 hours 07/28/24 0853 07/30/24 1131   07/23/24 0000  ceFAZolin  (ANCEF ) IVPB 2g/100 mL premix        2 g 200 mL/hr over 30 Minutes Intravenous Every 8 hours 07/22/24 2314 07/23/24 0058   07/22/24 0900  ceFAZolin  (ANCEF ) IVPB 2g/100 mL premix        2 g 200 mL/hr over 30 Minutes Intravenous To ShortStay Surgical 07/21/24 1938  07/22/24 1432       Assessment/Plan: s/p Procedure(s): WHIPPLE PROCEDURE (N/A) CREATION, JEJUNOSTOMY (N/A)- Kyrus Hyde 07/22/24 POD 15- 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  pT3bN1 final path.   Prn morphine , oxy, robaxin , tylenol  T bili down to normal.  AKI, creatinine stable ABL anemia, anemia chronic disease. 2 u PRBC on 9/9 Zosyn /fluc/vanc for panc leak and pneumonia. 10 days broad spectrum antibiotics from when vanc and fluc added.    Full liquids today.  Stay on this diet for several days at least.  Minimal calorie intake from this at this point.    TF now at goal. Continue fiber.  Continue imodium . Start cycling tube feeds.  Will just do 20 hours for now.   Start 1 cap creon  tid at this point with full liquids.   Limited utility of panc enzymes when continuous feeds and no liquid available.     PT/OT - she is working with therapies well, walking from bed to chair with modA +2. Hope for rehab admit.      LOS: 33 days    Jina LITTIE Nephew, MD, FACS, FSSO Surgical Oncology, General Surgery, Trauma and Critical [facility] Surgery, [place] [phone] for weekday/non holidays Check [email or web address] for coverage night/weekend/holidays

## 2024-08-06 NOTE — Progress Notes (Signed)
   Inpatient Rehabilitation Admissions Coordinator   I met with patient and her spouse at bedside. I explained that she currently is not at a level to tolerate CIR level rehab. Hopeful for continued progress over the next few days.   Heron Leavell, RN, MSN Rehab Admissions Coordinator [phone] 08/06/2024 2:32 PM

## 2024-08-06 NOTE — Progress Notes (Signed)
 Physical Therapy Treatment Patient Details Name: Lauren Gray MRN: [id number] DOB: 09-17-1951 Today's Date: 08/06/2024   History of Present Illness Pt is a 73 y/o female presenting with 1 month of nausea/vomiting. CT abdomen showed SBO from underlying mass. Mass biopsy showed + invasive adenocarcinoma. Requiring TPN. S/p pancreaticoduodenectomy, pancreatic duct stent placement, J-tube placement 9/9. PMH: hypokalemia, HTN, CKD, prediabetes    PT Comments  Currenty pt is Min a for bed mobility, Min A +2 for sit to stand and Min A to CGA for very short non-functional distances. Pt demonstrates high fear of pain/messing up surgical site during session. Pt required 3x rest breaks during 12 ft of gait and does well with structures per pt. Due to pt current functional status, home set up and available assistance at home recommending skilled physical therapy services > 3 hours/day in order to address strength, balance and functional mobility to decrease risk for falls, injury, immobility, skin break down and re-hospitalization.      If plan is discharge home, recommend the following: A lot of help with walking and/or transfers;A little help with bathing/dressing/bathroom;Assist for transportation;Help with stairs or ramp for entrance;Assistance with cooking/housework     Equipment Recommendations  Rolling walker (2 wheels)       Precautions / Restrictions Precautions Precautions: Fall;Other (comment) Precaution/Restrictions Comments: JP drainx2 (L) Restrictions Weight Bearing Restrictions Per Provider Order: No     Mobility  Bed Mobility Overal bed mobility: Needs Assistance Bed Mobility: Sidelying to Sit, Rolling, Sit to Sidelying Rolling: Contact guard assist Sidelying to sit: Min assist, Used rails     Sit to sidelying: Min assist, Used rails General bed mobility comments: Cueing to sequence log rolling, assist for trunk to supine, min assist to return lower legs to bed     Transfers Overall transfer level: Needs assistance Equipment used: Rolling walker (2 wheels) Transfers: Sit to/from Stand Sit to Stand: Min assist, +2 safety/equipment, +2 physical assistance           General transfer comment: Min assist +2 to stand. Once in standing pt largely CGA for gait. Use of pillow to brace abdomen    Ambulation/Gait Ambulation/Gait assistance: +2 physical assistance, +2 safety/equipment, Min assist, Contact guard assist Gait Distance (Feet): 12 Feet Assistive device: Rolling walker (2 wheels) Gait Pattern/deviations: Step-through pattern, Decreased stride length, Shuffle, Trunk flexed Gait velocity: Decreased Gait velocity interpretation: <1.31 ft/sec, indicative of household ambulator   General Gait Details: cues for body proximity to AD, intermittently trying to rest forearms on RW, short step through gait pattern with minimal heel toe gait. Very slow gait speed; pt taking 3x standign rest breaks during 12 ft of gait.     Balance Overall balance assessment: Needs assistance Sitting-balance support: Feet supported, Single extremity supported Sitting balance-Leahy Scale: Fair Sitting balance - Comments: Able to support self with UE sitting EOB   Standing balance support: During functional activity, Bilateral upper extremity supported, Reliant on assistive device for balance Standing balance-Leahy Scale: Poor Standing balance comment: Reliant on RW      Communication Communication Communication: No apparent difficulties  Cognition Arousal: Alert Behavior During Therapy: Anxious   PT - Cognitive impairments: No apparent impairments     PT - Cognition Comments: Cues for sequencing. Increased time to process cues Following commands: Intact      Cueing Cueing Techniques: Verbal cues     General Comments General comments (skin integrity, edema, etc.): Pt reports she feels lightheaded in standing during mobility  Pertinent Vitals/Pain  Pain Assessment Pain Assessment: Faces Faces Pain Scale: Hurts little more Facial Expression: Tense Body Movements: Protection Muscle Tension: Tense, rigid Compliance with ventilator (intubated pts.): N/A Vocalization (extubated pts.): Talking in normal tone or no sound CPOT Total: 3 Pain Location: Abdomen with activity Pain Descriptors / Indicators: Grimacing, Aching Pain Intervention(s): Monitored during session, Limited activity within patient's tolerance     PT Goals (current goals can now be found in the care plan section) Acute Rehab PT Goals Patient Stated Goal: Return to independence PT Goal Formulation: With patient Time For Goal Achievement: 08/08/24 Potential to Achieve Goals: Good Progress towards PT goals: Progressing toward goals    Frequency    Min 3X/week      PT Plan  Continue with current POC        AM-PAC PT 6 Clicks Mobility   Outcome Measure  Help needed turning from your back to your side while in a flat bed without using bedrails?: A Little Help needed moving from lying on your back to sitting on the side of a flat bed without using bedrails?: A Little Help needed moving to and from a bed to a chair (including a wheelchair)?: A Lot Help needed standing up from a chair using your arms (e.g., wheelchair or bedside chair)?: A Lot Help needed to walk in hospital room?: A Lot Help needed climbing 3-5 steps with a railing? : Total 6 Click Score: 13    End of Session Equipment Utilized During Treatment: Oxygen Activity Tolerance: Patient tolerated treatment well Patient left: in bed;with call bell/phone within reach;with nursing/sitter in room Nurse Communication: Mobility status PT Visit Diagnosis: Other abnormalities of gait and mobility (R26.89);Pain;Muscle weakness (generalized) (M62.81) Pain - part of body:  (abdomen)     Time: 8549-8480 PT Time Calculation (min) (ACUTE ONLY): 29 min  Charges:    $Therapeutic Activity: 8-22 mins PT  General Charges $$ ACUTE PT VISIT: 1 Visit                    Dorothyann Maier, DPT, CLT  Acute Rehabilitation Services Office: [phone] (Secure chat preferred)    Dorothyann VEAR Maier 08/06/2024, 4:20 PM

## 2024-08-07 DIAGNOSIS — K56609 Unspecified intestinal obstruction, unspecified as to partial versus complete obstruction: Secondary | ICD-10-CM | POA: Diagnosis not present

## 2024-08-07 LAB — GLUCOSE, CAPILLARY
Glucose-Capillary: 115 mg/dL — ABNORMAL HIGH (ref 70–99)
Glucose-Capillary: 138 mg/dL — ABNORMAL HIGH (ref 70–99)
Glucose-Capillary: 166 mg/dL — ABNORMAL HIGH (ref 70–99)
Glucose-Capillary: 161 mg/dL — ABNORMAL HIGH (ref 70–99)
Glucose-Capillary: 168 mg/dL — ABNORMAL HIGH (ref 70–99)

## 2024-08-07 LAB — BASIC METABOLIC PANEL WITH GFR
Anion gap: 10 (ref 5–15)
BUN: 10 mg/dL (ref 8–23)
CO2: 19 mmol/L — ABNORMAL LOW (ref 22–32)
Calcium: 7.5 mg/dL — ABNORMAL LOW (ref 8.9–10.3)
Chloride: 105 mmol/L (ref 98–111)
Creatinine, Ser: 1.01 mg/dL — ABNORMAL HIGH (ref 0.44–1.00)
GFR, Estimated: 59 mL/min — ABNORMAL LOW (ref >60)
Glucose, Bld: 181 mg/dL — ABNORMAL HIGH (ref 70–99)
Potassium: 3.8 mmol/L (ref 3.5–5.1)
Sodium: 134 mmol/L — ABNORMAL LOW (ref 135–145)

## 2024-08-07 LAB — CBC
HCT: 29.8 % — ABNORMAL LOW (ref 36.0–46.0)
Hemoglobin: 9.4 g/dL — ABNORMAL LOW (ref 12.0–15.0)
MCH: 29.3 pg (ref 26.0–34.0)
MCHC: 31.5 g/dL (ref 30.0–36.0)
MCV: 92.8 fL (ref 80.0–100.0)
Platelets: 542 K/uL — ABNORMAL HIGH (ref 150–400)
RBC: 3.21 MIL/uL — ABNORMAL LOW (ref 3.87–5.11)
RDW: 15.9 % — ABNORMAL HIGH (ref 11.5–15.5)
WBC: 8.8 K/uL (ref 4.0–10.5)
nRBC: 0 % (ref 0.0–0.2)

## 2024-08-07 LAB — PHOSPHORUS: Phosphorus: 2.2 mg/dL — ABNORMAL LOW (ref 2.5–4.6)

## 2024-08-07 LAB — MAGNESIUM: Magnesium: 1.8 mg/dL (ref 1.7–2.4)

## 2024-08-07 MED ORDER — PROSOURCE TF20 ENFIT COMPATIBL EN LIQD
60.0000 mL | Freq: Two times a day (BID) | ENTERAL | Status: DC
  Administered 2024-08-07 – 2024-08-09 (×5): 60 mL
  Filled 2024-08-07 (×4): qty 60

## 2024-08-07 MED ORDER — FREE WATER
100.0000 mL | Status: DC
Start: 2024-08-07 — End: 2024-08-10
  Administered 2024-08-07 – 2024-08-10 (×17): 100 mL

## 2024-08-07 MED ORDER — OSMOLITE 1.5 CAL PO LIQD
1000.0000 mL | ORAL | Status: DC
  Administered 2024-08-08 – 2024-08-09 (×2): 1000 mL
  Filled 2024-08-07: qty 1000

## 2024-08-07 MED ORDER — OSMOLITE 1.5 CAL PO LIQD
1000.0000 mL | ORAL | Status: DC
  Administered 2024-08-07: 1000 mL

## 2024-08-07 NOTE — Progress Notes (Addendum)
 Nutrition Follow-up  DOCUMENTATION CODES:   Obesity unspecified  INTERVENTION:  Continue diet advancement as recommended by Surgery Currently on FLD Creon  per MD - 1 cap creon  TID with full liquids  *Pt refuses all ONS    Continue TF via J-tube x 18 hours:  Osmolite 1.5 @ 55 ml/hr from 1600-1000 (990 ml daily) 60ml ProSource TF20 BID daily  Provides 1645 kcal, 103 gm protein, and 762 ml free water  daily Continue Banatrol and Imodium     Change free water  flushes to 100 ml q4h as sodium low and pt drinking liquids (600 ml daily)  NUTRITION DIAGNOSIS:   Inadequate oral intake related to nausea, vomiting (GOO r/t duodenal mass) as evidenced by per patient/family report. - Ongoing   GOAL:   Patient will meet greater than or equal to 90% of their needs - Progressing   MONITOR:   Diet advancement, Labs, Weight trends, TF tolerance, Skin, I & O's, Other (Comment) (TPN)  REASON FOR ASSESSMENT:   Consult New TPN/TNA  ASSESSMENT:  73 y/o female with h/o CKD II, hiatal hernia, HLD, pre-diabetes, s/p hysterectomy and pelvic mass s/p laparoscopic bilateral salpingo-oophorectomy (2007) who is admitted with AKI and GOO secondary to duodenal mass (adenocarcinoma) now s/p classic pancreaticoduodenectomy, placement of pancreatic duct stent and placement of (18 [place]) feeding jejunostomy tube 9/9.  Pt restin in bed with family at bedside. Tube feeds now running for 18 hours per MD. Diet advanced to FLD on 9/23. Has been tolerating. Pt does not have an appetite for a FLD but does want to taste solid food. No nausea/vomting/abdominal pain. Had 100% cream of chicken soup, 50% orange juice, 1 orange sherbet for breakfast. Minimal PO intake. Has fear that food will cause another obstruction.  No way to add pancreatic enzymes for continuous tube feeds. On PO creon  with meals.    Stomach feels distended as pt reprots she has flatus that is hard to pass. Last BM was yesterday, reported as type 5.  BM seem to be imrpvoing with additon of imodium . Pt is adamnet that she does not want any ONS. Wants real food instead. Picky eater at baseline. Provided post whipple edu.    Intake/Output Summary (Last 24 hours) at 08/07/2024 1421 Last data filed at 08/07/2024 1147 Gross per 24 hour  Intake 1674 ml  Output 2487 ml  Net -813 ml   Drains/Lines: R JP drain: 335 ml x 24 hours  R lateral JP drain: 7 ml x 24 hours   Admit weight: 109.3 kg Current weight: 121 kg    Average Meal Intake: No meals recorded   Disposition: CIR  Nutritionally Relevant Medications: Scheduled Meds:  feeding supplement (PROSource TF20)  60 mL Per Tube BID   fiber supplement (BANATROL TF)  60 mL Per Tube BID   fluticasone   2 spray Each Nare Daily   free water   100 mL Per Tube Q4H   insulin  aspart  0-20 Units Subcutaneous Q4H   insulin  aspart  5 Units Subcutaneous Q4H   insulin  glargine  10 Units Subcutaneous Daily   lipase/protease/amylase  24,000 Units Oral TID AC   Continuous Infusions:  feeding supplement (OSMOLITE 1.5 CAL) 66 mL/hr at 08/06/24 1230   fluconazole  (DIFLUCAN ) IV 400 mg (08/07/24 1140)   piperacillin -tazobactam (ZOSYN )  IV 3.375 g (08/07/24 0529)   vancomycin  1,000 mg (08/07/24 1109)   Labs Reviewed: Sodium 134 Creatinine 1.01 Phosphorus 2.2 GFR 59 CBG ranges from 115-166 mg/dL over the last 24 hours HgbA1c 6.1  Diet Order:  Diet Order             Diet full liquid Room service appropriate? Yes; Fluid consistency: Thin  Diet effective now                   EDUCATION NEEDS:   Education needs have been addressed  Skin:  Skin Assessment: Reviewed RN Assessment (incision abdomen, JP drain, VAC)  Last BM:  9/24, type 5  Height:   Ht Readings from Last 1 Encounters:  07/22/24 5' 8 (1.727 m)    Weight:   Wt Readings from Last 1 Encounters:  08/06/24 121 kg    Ideal Body Weight:  63.6 kg  BMI:  Body mass index is 40.56 kg/m.  Estimated Nutritional Needs:    Kcal:  2000-2200  Protein:  100-115g  Fluid:  1.9-2.2L/day   Olivia Kenning, RD Registered Dietitian  See Amion for more information

## 2024-08-07 NOTE — Plan of Care (Signed)
 Patient with poor appetite, eating less than 10% of full liquid diet.   Problem: Education: Goal: Knowledge of General Education information will improve Description: Including pain rating scale, medication(s)/side effects and non-pharmacologic comfort measures Outcome: Progressing   Problem: Health Behavior/Discharge Planning: Goal: Ability to manage health-related needs will improve Outcome: Progressing   Problem: Clinical Measurements: Goal: Ability to maintain clinical measurements within normal limits will improve Outcome: Progressing Goal: Will remain free from infection Outcome: Progressing Goal: Diagnostic test results will improve Outcome: Progressing Goal: Respiratory complications will improve Outcome: Progressing Goal: Cardiovascular complication will be avoided Outcome: Progressing

## 2024-08-07 NOTE — Progress Notes (Signed)
 Mobility Specialist Progress Note:    08/07/24 1432  Mobility  Activity Pivoted/transferred from chair to bed  Level of Assistance Minimal assist, patient does 75% or more  Assistive Device Front wheel walker  Distance Ambulated (ft) 8 ft  Activity Response Tolerated well  Mobility Referral Yes  Mobility visit 1 Mobility  Mobility Specialist Start Time (ACUTE ONLY) 1420  Mobility Specialist Stop Time (ACUTE ONLY) 1429  Mobility Specialist Time Calculation (min) (ACUTE ONLY) 9 min   Received pt in chair requesting assistance to bed. Pt required MinA +2 STS, otherwise contact guard. Pt left in bed. Personal belongings and call light within reach. All needs met. RN present.  Lavanda Pollack Mobility Specialist  Please contact via [employer] or  Rehab Office [phone]

## 2024-08-07 NOTE — Progress Notes (Signed)
 Physical Therapy Progress Note Patient Details Name: Lauren Gray MRN: [id number] DOB: 1951-10-29 Today's Date: 08/07/2024   History of Present Illness Pt is a 73 y/o female presenting with 1 month of nausea/vomiting. CT abdomen showed SBO from underlying mass. Mass biopsy showed + invasive adenocarcinoma. Requiring TPN. S/p pancreaticoduodenectomy, pancreatic duct stent placement, J-tube placement 9/9. PMH: hypokalemia, HTN, CKD, prediabetes    PT Comments  Pt is progressing towards goals. Goals were assessed and upgraded today due to good progress towards goals. Pt was able to increase gait distance to 30 ft. Continue to use second person for safety and due to pt anxiety/high fear of falling and fear of pain/dizziness. Pt did not report any dizziness during gait today. Due to pt current functional status, home set up and available assistance at home recommending skilled physical therapy services > 3 hours/day in order to address strength, balance and functional mobility to decrease risk for falls, injury, immobility, skin break down and re-hospitalization.      If plan is discharge home, recommend the following: A lot of help with walking and/or transfers;Assist for transportation;Help with stairs or ramp for entrance;Assistance with cooking/housework     Equipment Recommendations  Rolling walker (2 wheels)       Precautions / Restrictions Precautions Precautions: Fall;Other (comment) Precaution/Restrictions Comments: JP drainx2 (L) Restrictions Weight Bearing Restrictions Per Provider Order: No     Mobility  Bed Mobility Overal bed mobility: Needs Assistance Bed Mobility: Sidelying to Sit, Rolling Rolling: Contact guard assist Sidelying to sit: Contact guard assist, Used rails       General bed mobility comments: Cueing to sequence log rolling, assist for trunk sitting at CGA    Transfers Overall transfer level: Needs assistance Equipment used: Rolling walker (2  wheels) Transfers: Sit to/from Stand, Bed to chair/wheelchair/BSC Sit to Stand: Min assist, +2 safety/equipment   Step pivot transfers: +2 safety/equipment, Contact guard assist       General transfer comment: Min assist +2 to stand with second person for safety due to pt anxiety. Once in standing pt largely CGA for gait. Verbal cues for safe hand placement to push up from bed/BSC    Ambulation/Gait Ambulation/Gait assistance: +2 safety/equipment, Contact guard assist Gait Distance (Feet): 30 Feet Assistive device: Rolling walker (2 wheels) Gait Pattern/deviations: Step-through pattern, Decreased stride length, Trunk flexed Gait velocity: Decreased Gait velocity interpretation: <1.31 ft/sec, indicative of household ambulator   General Gait Details: cues for body proximity to AD heavily, intermittently trying to rest forearms on RW, short step through gait pattern with minimal heel toe gait. Very slow gait speed; pt taking 3x standing rest breaks during 30 ft of gait.        Balance Overall balance assessment: Needs assistance Sitting-balance support: Feet supported, Single extremity supported Sitting balance-Leahy Scale: Fair Sitting balance - Comments: Able to support self with UE sitting EOB   Standing balance support: During functional activity, Bilateral upper extremity supported, Reliant on assistive device for balance Standing balance-Leahy Scale: Poor Standing balance comment: Reliant on RW      Communication Communication Communication: No apparent difficulties  Cognition Arousal: Alert Behavior During Therapy: Anxious   PT - Cognitive impairments: No apparent impairments     PT - Cognition Comments: Cues for sequencing. Following commands: Intact      Cueing Cueing Techniques: Verbal cues     General Comments General comments (skin integrity, edema, etc.): Pt did not report any lightheadedness during session today.      Pertinent  Vitals/Pain Pain  Assessment Pain Assessment: Faces Faces Pain Scale: Hurts little more Breathing: normal Negative Vocalization: occasional moan/groan, low speech, negative/disapproving quality Facial Expression: smiling or inexpressive Body Language: relaxed Consolability: no need to console PAINAD Score: 1 Pain Location: Abdomen with activity Pain Descriptors / Indicators: Grimacing, Aching Pain Intervention(s): Monitored during session, Repositioned     PT Goals (current goals can now be found in the care plan section) Acute Rehab PT Goals Patient Stated Goal: Return to independence PT Goal Formulation: With patient Time For Goal Achievement: 08/21/24 Potential to Achieve Goals: Good Progress towards PT goals: Progressing toward goals    Frequency    Min 3X/week      PT Plan  Continue with current POC        AM-PAC PT 6 Clicks Mobility   Outcome Measure  Help needed turning from your back to your side while in a flat bed without using bedrails?: A Little Help needed moving from lying on your back to sitting on the side of a flat bed without using bedrails?: A Little Help needed moving to and from a bed to a chair (including a wheelchair)?: A Little Help needed standing up from a chair using your arms (e.g., wheelchair or bedside chair)?: A Little Help needed to walk in hospital room?: A Lot Help needed climbing 3-5 steps with a railing? : Total 6 Click Score: 15    End of Session Equipment Utilized During Treatment: Oxygen Activity Tolerance: Patient tolerated treatment well Patient left: in chair;with call bell/phone within reach;with family/visitor present Nurse Communication: Mobility status PT Visit Diagnosis: Other abnormalities of gait and mobility (R26.89);Pain;Muscle weakness (generalized) (M62.81) Pain - part of body:  (abdomen)     Time: 8787-8743 PT Time Calculation (min) (ACUTE ONLY): 44 min  Charges:    $Gait Training: 8-22 mins $Therapeutic Activity:  23-37 mins PT General Charges $$ ACUTE PT VISIT: 1 Visit                     Dorothyann Maier, DPT, CLT  Acute Rehabilitation Services Office: [phone] (Secure chat preferred)    Dorothyann VEAR Maier 08/07/2024, 1:18 PM

## 2024-08-07 NOTE — Progress Notes (Signed)
  Inpatient Rehabilitation Admissions Coordinator   Noted great participation with PT and OT yesterday after we discussed need for increased participation. I await medical readiness to pursue CIR admit.   Heron Leavell, RN, MSN Rehab Admissions Coordinator [phone] 08/07/2024 11:36 AM

## 2024-08-07 NOTE — Progress Notes (Signed)
 Lauren Gray  FMW:[id number] DOB: [birth year] DOA: 07/04/2024 PCP: Juanice Thomes SAUNDERS, FNP    Brief Narrative:  73 year old with a history of HLD, pre-DM, and CKD stage III who presented to the hospital 07/04/2024 with a 1 month history of persistent vomiting with inability to tolerate anything other than a small volume of yogurt or crackers.  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  Following admission and an extensive evaluation she underwent a Whipple procedure 07/22/2024.  Goals of Care:   Code Status: Full Code   DVT prophylaxis: heparin  injection 5,000 Units Start: 08/05/24 0600 SCD's Start: 07/22/24 2315   Interim Hx: No acute events recorded overnight.  Afebrile.  Vital signs stable.  CBG controlled.  No new complaints today.  In good spirits.  Assessment & Plan:  Duodenal adenocarcinoma with gastric outlet obstruction CT scan showed dilated 1st and 2nd portion of duodenum with possible underlying mass - 8/24 underwent small bowel enteroscopy - duodenal mass biopsy was positive for adenocarcinoma - due to poor nutritional status patient was started on TPN - Whipple procedure performed 07/22/2024 - CT abdomen and chest on 9/17 shows no new changes   Bilateral pleural effusion - Multifocal pneumonia CT chest noted evidence of bilateral pleural effusion likely third spacing in the setting of hypoalbuminemia - underwent thoracentesis yielding 250 mL fluid > exudative based on LDH - cytology negative for malignant cells   Normocytic anemia / postop blood loss anemia with a combination of dilution from IV fluids Iron level normal.  Folic acid within range.  B12 normal.  Reticulocyte count elevated but CT abdomen negative for any acute blood loss.  No hemothorax on thoracentesis. 1 PRBC transfusion on 9/19 followed by 2 more PRBC transfusion - Hgb stable presently   Essential hypertension Blood pressure remains modestly elevated -follow-up  without change in therapy for today  Prediabetes with hyperglycemia A1c 6.1 - CBG controlled   AKI on CKD 2 Baseline creatinine normal - admission serum creatinine was 1.35 -creatinine has normalized with support   Anxiety and insomnia Trazodone  at nighttime and Valium  as needed  Obesity Class 3 - Body mass index is 40.56 kg/m.   Hypernatremia Resolved with free water  expansion   LFT elevation Has resolved with supportive care   Bilateral edema third spacing - compression stocking ordered - SCD recommended     Family Communication:  Disposition:   Acute Inpatient Rehab (3hours/Day)08/06/2024 1620  Objective: Blood pressure (!) 155/74, pulse 92, temperature 97.6 F (36.4 C), temperature source Oral, resp. rate 18, height 5' 8 (1.727 m), weight 121 kg, SpO2 100%.  Intake/Output Summary (Last 24 hours) at 08/07/2024 1135 Last data filed at 08/07/2024 1044 Gross per 24 hour  Intake 1474 ml  Output 2472 ml  Net -998 ml   Filed Weights   08/02/24 0600 08/04/24 0222 08/06/24 0418  Weight: 123 kg 124 kg 121 kg    Examination: General: No acute respiratory distress Lungs: Clear to auscultation bilaterally  Cardiovascular: Regular rate and rhythm without murmur  Abdomen: Nontender, nondistended, soft, bowel sounds positive, no rebound Extremities: No significant edema bilateral lower extremities  CBC: Recent Labs  Lab 08/04/24 0740 08/05/24 0412 08/06/24 0600 08/07/24 0213  WBC 12.0* 10.7* 8.9 8.8  NEUTROABS 9.2* 8.3* 6.6  --   HGB 8.7* 8.7* 8.8* 9.4*  HCT 27.0* 28.0* 27.3* 29.8*  MCV 94.1 93.3 91.9 92.8  PLT 511* 507* 520* 542*   Basic Metabolic  Panel: Recent Labs  Lab 08/05/24 0412 08/06/24 0600 08/07/24 0213  NA 139 137 134*  K 4.0 3.9 3.8  CL 109 108 105  CO2 23 23 19*  GLUCOSE 179* 130* 181*  BUN 14 11 10   CREATININE 1.19* 0.98 1.01*  CALCIUM  7.3* 7.3* 7.5*  MG 1.9 1.7 1.8  PHOS  --   --  2.2*   GFR: Estimated Creatinine Clearance: 67.9  mL/min (A) (by C-G formula based on SCr of 1.01 mg/dL (H)).   Scheduled Meds:  amLODipine   10 mg Oral Daily   Chlorhexidine  Gluconate Cloth  6 each Topical Q0600   cloNIDine   0.1 mg Transdermal Q Thu   feeding supplement (PROSource TF20)  60 mL Per Tube Daily   fiber supplement (BANATROL TF)  60 mL Per Tube BID   fluticasone   2 spray Each Nare Daily   free water   200 mL Per Tube Q4H   gabapentin   100 mg Per Tube Q8H   heparin  injection (subcutaneous)  5,000 Units Subcutaneous Q8H   insulin  aspart  0-20 Units Subcutaneous Q4H   insulin  aspart  5 Units Subcutaneous Q4H   insulin  glargine  10 Units Subcutaneous Daily   lipase/protease/amylase  24,000 Units Oral TID AC   loperamide   2 mg Oral TID   methocarbamol  (ROBAXIN ) injection  500 mg Intravenous Q8H   metoprolol  tartrate  50 mg Per Tube BID   pantoprazole  (PROTONIX ) IV  40 mg Intravenous Q12H   traZODone   50 mg Per Tube QHS   Continuous Infusions:  feeding supplement (OSMOLITE 1.5 CAL) 66 mL/hr at 08/06/24 1230   fluconazole  (DIFLUCAN ) IV 400 mg (08/06/24 0932)   piperacillin -tazobactam (ZOSYN )  IV 3.375 g (08/07/24 0529)   vancomycin  1,000 mg (08/07/24 1109)     LOS: 34 days   Reyes IVAR Moores, MD [facility] Office  [phone] Pager - Text Page per Tracey  If 7PM-7AM, please contact night-coverage per Amion 08/07/2024, 11:35 AM

## 2024-08-07 NOTE — Progress Notes (Signed)
 Verbal order from [facility] MD to stop feedings from 0800 to 1400.

## 2024-08-07 NOTE — Progress Notes (Signed)
 16 Days Post-Op   Subjective/Chief Complaint: Transferred to the floor.  Doing ok overall. Having a little dizziness right now after doing some therapy.     Objective: Vital signs in last 24 hours: Temp:  [97.5 F (36.4 C)-98.4 F (36.9 C)] 97.6 F (36.4 C) (09/25 0844) Pulse Rate:  [87-93] 92 (09/25 0844) Resp:  [17-20] 18 (09/25 0844) BP: (151-184)/(57-75) 155/74 (09/25 0844) SpO2:  [94 %-100 %] 100 % (09/25 0844) Last BM Date : 08/06/24  Intake/Output from previous day: 09/24 0701 - 09/25 0700 In: 1200 [NG/GT:1200] Out: 2992 [Urine:2650; Drains:342] Intake/Output this shift: Total I/O In: 874 [P.O.:474; NG/GT:400] Out: 490 [Urine:400; Drains:90]  General: alert, no distress Resp: No respiratory distress with normal rate CV: Regular rate and rhythm Abd:  soft, midline wound without any erythema or drainage at the upper portion.  Lower portion with some drainage, but dried, and less than yesterday.  Lauren Gray #2 with scant drainage, but still a little purulent. Getting thinner.   Ext:  SCDs in place  Lab Results:  Recent Labs    08/06/24 0600 08/07/24 0213  WBC 8.9 8.8  HGB 8.8* 9.4*  HCT 27.3* 29.8*  PLT 520* 542*    BMET Recent Labs    08/06/24 0600 08/07/24 0213  NA 137 134*  K 3.9 3.8  CL 108 105  CO2 23 19*  GLUCOSE 130* 181*  BUN 11 10  CREATININE 0.98 1.01*  CALCIUM  7.3* 7.5*   PT/INR No results for input(s): LABPROT, INR in the last 72 hours.   ABG No results for input(s): PHART, HCO3 in the last 72 hours.  Invalid input(s): PCO2, PO2   Studies/Results: No results found.     Anti-infectives: Anti-infectives (From admission, onward)    Start     Dose/Rate Route Frequency Ordered Stop   08/07/24 0000  vancomycin  (VANCOCIN ) IVPB 1000 mg/200 mL premix  Status:  Discontinued        1,000 mg 200 mL/hr over 60 Minutes Intravenous  Once 08/06/24 1106 08/06/24 1346   08/06/24 2300  vancomycin  (VANCOCIN ) IVPB 1000 mg/200 mL  premix        1,000 mg 200 mL/hr over 60 Minutes Intravenous Every 12 hours 08/06/24 1346 08/10/24 1059   08/06/24 1200  vancomycin  (VANCOCIN ) IVPB 1000 mg/200 mL premix  Status:  Discontinued        1,000 mg 200 mL/hr over 60 Minutes Intravenous  Once 08/06/24 1105 08/06/24 1106   08/02/24 1400  piperacillin -tazobactam (ZOSYN ) IVPB 3.375 g        3.375 g 12.5 mL/hr over 240 Minutes Intravenous Every 8 hours 08/01/24 2238 08/09/24 2359   08/01/24 2330  cefTRIAXone  (ROCEPHIN ) injection 1 g       Placed in And Linked Group   1 g Intramuscular  Once 08/01/24 2238 08/02/24 0015   08/01/24 1800  vancomycin  (VANCOCIN ) IVPB 1000 mg/200 mL premix  Status:  Discontinued        1,000 mg 200 mL/hr over 60 Minutes Intravenous Every 12 hours 08/01/24 1642 08/06/24 1346   08/01/24 1047  vancomycin  variable dose per unstable renal function (pharmacist dosing)  Status:  Discontinued         Does not apply See admin instructions 08/01/24 1047 08/01/24 1646   07/30/24 2300  vancomycin  (VANCOCIN ) IVPB 1000 mg/200 mL premix  Status:  Discontinued        1,000 mg 200 mL/hr over 60 Minutes Intravenous Every 12 hours 07/30/24 1029 08/01/24 1047   07/30/24  1400  piperacillin -tazobactam (ZOSYN ) IVPB 3.375 g  Status:  Discontinued        3.375 g 12.5 mL/hr over 240 Minutes Intravenous Every 8 hours 07/30/24 1225 08/01/24 2238   07/30/24 1115  fluconazole  (DIFLUCAN ) IVPB 400 mg        400 mg 100 mL/hr over 120 Minutes Intravenous Every 24 hours 07/30/24 1025 08/10/24 0959   07/30/24 1100  vancomycin  (VANCOREADY) IVPB 2000 mg/400 mL        2,000 mg 200 mL/hr over 120 Minutes Intravenous  Once 07/30/24 1010 07/30/24 1540   07/28/24 0945  piperacillin -tazobactam (ZOSYN ) IVPB 3.375 g  Status:  Discontinued        3.375 g 12.5 mL/hr over 240 Minutes Intravenous Every 8 hours 07/28/24 0853 07/30/24 1131   07/23/24 0000  ceFAZolin  (ANCEF ) IVPB 2g/100 mL premix        2 g 200 mL/hr over 30 Minutes Intravenous Every  8 hours 07/22/24 2314 07/23/24 0058   07/22/24 0900  ceFAZolin  (ANCEF ) IVPB 2g/100 mL premix        2 g 200 mL/hr over 30 Minutes Intravenous To ShortStay Surgical 07/21/24 1938 07/22/24 1432       Assessment/Plan: s/p Procedure(s): WHIPPLE PROCEDURE (N/A) CREATION, JEJUNOSTOMY (N/A)- Lauren Gray 07/22/24 POD 16- Obstructing 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  pT3bN1 final path.   Prn morphine , oxy, robaxin , tylenol  T bili down to normal.  AKI, creatinine stable ABL anemia, anemia chronic disease. 2 u PRBC on 9/9 Zosyn /fluc/vanc for panc leak and pneumonia. 10 days broad spectrum antibiotics from when vanc and fluc added.    Full liquids orally.  Stay on this diet for several days at least.  Minimal calorie intake from this at this point.  Pt with food fear which is normal at this point.    Better stool consistency.   TF now at goal. Continue fiber.  Continue imodium . Cycling tube feeds.  Transition to 18 h cycle today.  Start 1 cap creon  tid at this point with full liquids.    Limited utility of panc enzymes when continuous feeds and no liquid available.     PT/OT - she is working with therapies well, walking from bed to chair with modA +2. Hope for rehab admit.      LOS: 34 days    Lauren Gray Nephew, MD, FACS, FSSO Surgical Oncology, General Surgery, Trauma and Critical [facility], [place] [phone] for weekday/non holidays Check [email or web address] for coverage night/weekend/holidays

## 2024-08-08 ENCOUNTER — Telehealth: Payer: Self-pay

## 2024-08-08 DIAGNOSIS — K56609 Unspecified intestinal obstruction, unspecified as to partial versus complete obstruction: Secondary | ICD-10-CM | POA: Diagnosis not present

## 2024-08-08 LAB — GLUCOSE, CAPILLARY
Glucose-Capillary: 244 mg/dL — ABNORMAL HIGH (ref 70–99)
Glucose-Capillary: 166 mg/dL — ABNORMAL HIGH (ref 70–99)
Glucose-Capillary: 157 mg/dL — ABNORMAL HIGH (ref 70–99)
Glucose-Capillary: 124 mg/dL — ABNORMAL HIGH (ref 70–99)
Glucose-Capillary: 81 mg/dL (ref 70–99)
Glucose-Capillary: 194 mg/dL — ABNORMAL HIGH (ref 70–99)

## 2024-08-08 MED ORDER — INSULIN ASPART 100 UNIT/ML IJ SOLN
0.0000 [IU] | Freq: Three times a day (TID) | INTRAMUSCULAR | Status: DC
  Administered 2024-08-09: 3 [IU] via SUBCUTANEOUS
  Administered 2024-08-09: 2 [IU] via SUBCUTANEOUS
  Administered 2024-08-10 (×2): 3 [IU] via SUBCUTANEOUS
  Administered 2024-08-10: 2 [IU] via SUBCUTANEOUS
  Administered 2024-08-11: 7 [IU] via SUBCUTANEOUS

## 2024-08-08 MED ORDER — INSULIN ASPART 100 UNIT/ML IJ SOLN
0.0000 [IU] | Freq: Every day | INTRAMUSCULAR | Status: DC
  Administered 2024-08-10: 2 [IU] via SUBCUTANEOUS

## 2024-08-08 MED ORDER — METOPROLOL TARTRATE 25 MG/10 ML ORAL SUSPENSION: 100.0000 mg | Freq: Two times a day (BID) | ORAL | Status: DC

## 2024-08-08 MED ORDER — INSULIN ASPART 100 UNIT/ML IJ SOLN
3.0000 [IU] | Freq: Three times a day (TID) | INTRAMUSCULAR | Status: DC
  Administered 2024-08-09 – 2024-08-11 (×6): 3 [IU] via SUBCUTANEOUS

## 2024-08-08 MED ORDER — INSULIN ASPART 100 UNIT/ML IJ SOLN
5.0000 [IU] | Freq: Three times a day (TID) | INTRAMUSCULAR | Status: DC
  Administered 2024-08-08: 5 [IU] via SUBCUTANEOUS

## 2024-08-08 MED ORDER — METOPROLOL TARTRATE 50 MG PO TABS
100.0000 mg | ORAL_TABLET | Freq: Two times a day (BID) | ORAL | Status: DC
  Administered 2024-08-08 – 2024-08-09 (×4): 100 mg via ORAL
  Filled 2024-08-08 (×4): qty 2

## 2024-08-08 NOTE — Progress Notes (Signed)
 OT Cancellation Note  Patient Details Name: Lauren Gray MRN: [id number] DOB: 1951-07-13   Cancelled Treatment:    Reason Eval/Treat Not Completed: Patient declined, no reason specified;Other (comment) (wanted to rest after getting meds)  Joshua Silvano Dragon 08/08/2024, 11:32 AM

## 2024-08-08 NOTE — Plan of Care (Signed)
   Problem: Education: Goal: Knowledge of General Education information will improve Description: Including pain rating scale, medication(s)/side effects and non-pharmacologic comfort measures Outcome: Progressing   Problem: Clinical Measurements: Goal: Cardiovascular complication will be avoided Outcome: Progressing

## 2024-08-08 NOTE — Telephone Encounter (Signed)
 Copied from CRM #[id number]. Topic: Clinical - Medical Advice >> Aug 08, 2024  4:05 PM [name] wrote: Reason for CRM: patient would like to ask if the office could call the hospital and explain she was getting her medication from the nurse and marked down the patient was refusing PT and this is not true and the PT told her would return in 30 minutes and the patient is concerned cause she really wants to do PT and would like for the message to be removed that she refused PT Patient states she will not refuse any services she wants to get strength back   Pt num [phone] BENNIE) Lauren Gray [phone] if there is no answer with [name]

## 2024-08-08 NOTE — Progress Notes (Signed)
 Lauren Gray  FMW:[id number] DOB: 31-May-1951 DOA: 07/04/2024 PCP: Juanice Thomes SAUNDERS, FNP    Brief Narrative:  73 year old with a history of HLD, pre-DM, and CKD stage III who presented to the hospital 07/04/2024 with a 1 month history of persistent vomiting with inability to tolerate anything other than a small volume of yogurt or crackers.  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  Following admission and an extensive evaluation she underwent a Whipple procedure 07/22/2024.  Goals of Care:   Code Status: Full Code   DVT prophylaxis: heparin  injection 5,000 Units Start: 08/05/24 0600 SCD's Start: 07/22/24 2315   Interim Hx: No acute events reported overnight.  Afebrile.  Vital signs stable.  CBG reasonably controlled.  Resting comfortably in bed at time of visit.  In good spirits.  Quite pleasant.  Denies any new complaints.  Is tolerating small amounts of her diet without difficulty.  Assessment & Plan:  Duodenal adenocarcinoma with gastric outlet obstruction CT scan showed dilated 1st and 2nd portion of duodenum with possible underlying mass - 8/24 underwent small bowel enteroscopy - duodenal mass biopsy was positive for adenocarcinoma - due to poor nutritional status patient was started on TPN - Whipple procedure performed 07/22/2024 - CT abdomen and chest on 9/17 shows no new changes -ongoing care per General Surgery   Bilateral pleural effusion - Multifocal pneumonia CT chest noted evidence of bilateral pleural effusion likely third spacing in the setting of hypoalbuminemia - underwent thoracentesis yielding 250 mL fluid > exudative based on LDH - cytology negative for malignant cells   Normocytic anemia / postop blood loss anemia with a combination of dilution from IV fluids 1 PRBC transfusion on 9/19 followed by 2 more PRBC transfusion - Hgb stable presently   Essential hypertension Blood pressure remains modestly elevated -adjust  therapy today and follow  Prediabetes with hyperglycemia A1c 6.1 - CBG controlled   AKI on CKD 2 Baseline creatinine normal - admission serum creatinine was 1.35 -creatinine has normalized with support   Anxiety and insomnia Trazodone  at nighttime and Valium  as needed  Obesity Class 3 - Body mass index is 40.56 kg/m.   Hypernatremia Resolved with free water  expansion   LFT elevation Has resolved with supportive care   Bilateral edema third spacing - compression stocking ordered - SCD recommended     Family Communication: Spoke with husband at bedside Disposition:   Acute Inpatient Rehab (3hours/Day)08/07/2024 1317  Objective: Blood pressure (!) 159/58, pulse 91, temperature 98.3 F (36.8 C), temperature source Oral, resp. rate 16, height 5' 8 (1.727 m), weight 121 kg, SpO2 97%.  Intake/Output Summary (Last 24 hours) at 08/08/2024 1108 Last data filed at 08/08/2024 0942 Gross per 24 hour  Intake 940 ml  Output 2700 ml  Net -1760 ml   Filed Weights   08/02/24 0600 08/04/24 0222 08/06/24 0418  Weight: 123 kg 124 kg 121 kg    Examination: General: No acute respiratory distress Lungs: Clear to auscultation bilaterally  Cardiovascular: Regular rate and rhythm without murmur  Abdomen: Nontender, nondistended, soft, bowel sounds positive, no rebound Extremities: No significant edema B LE   CBC: Recent Labs  Lab 08/04/24 0740 08/05/24 0412 08/06/24 0600 08/07/24 0213  WBC 12.0* 10.7* 8.9 8.8  NEUTROABS 9.2* 8.3* 6.6  --   HGB 8.7* 8.7* 8.8* 9.4*  HCT 27.0* 28.0* 27.3* 29.8*  MCV 94.1 93.3 91.9 92.8  PLT 511* 507* 520* 542*   Basic  Metabolic Panel: Recent Labs  Lab 08/05/24 0412 08/06/24 0600 08/07/24 0213  NA 139 137 134*  K 4.0 3.9 3.8  CL 109 108 105  CO2 23 23 19*  GLUCOSE 179* 130* 181*  BUN 14 11 10   CREATININE 1.19* 0.98 1.01*  CALCIUM  7.3* 7.3* 7.5*  MG 1.9 1.7 1.8  PHOS  --   --  2.2*   GFR: Estimated Creatinine Clearance: 67.9 mL/min  (A) (by C-G formula based on SCr of 1.01 mg/dL (H)).   Scheduled Meds:  amLODipine   10 mg Oral Daily   Chlorhexidine  Gluconate Cloth  6 each Topical Q0600   cloNIDine   0.1 mg Transdermal Q Thu   feeding supplement (OSMOLITE 1.5 CAL)  1,000 mL Per Tube Q24H   feeding supplement (PROSource TF20)  60 mL Per Tube BID   fiber supplement (BANATROL TF)  60 mL Per Tube BID   fluticasone   2 spray Each Nare Daily   free water   100 mL Per Tube Q4H   gabapentin   100 mg Per Tube Q8H   heparin  injection (subcutaneous)  5,000 Units Subcutaneous Q8H   insulin  aspart  0-20 Units Subcutaneous Q4H   insulin  aspart  5 Units Subcutaneous Q4H   insulin  glargine  10 Units Subcutaneous Daily   lipase/protease/amylase  24,000 Units Oral TID AC   loperamide   2 mg Oral TID   methocarbamol  (ROBAXIN ) injection  500 mg Intravenous Q8H   metoprolol  tartrate  50 mg Per Tube BID   pantoprazole  (PROTONIX ) IV  40 mg Intravenous Q12H   traZODone   50 mg Per Tube QHS   Continuous Infusions:  fluconazole  (DIFLUCAN ) IV 400 mg (08/08/24 0954)   piperacillin -tazobactam (ZOSYN )  IV 3.375 g (08/08/24 0552)   vancomycin  1,000 mg (08/08/24 0000)     LOS: 35 days   Reyes IVAR Moores, MD [facility] Office  [phone] Pager - Text Page per Tracey  If 7PM-7AM, please contact night-coverage per Amion 08/08/2024, 11:08 AM

## 2024-08-08 NOTE — Progress Notes (Signed)
 17 Days Post-Op   Subjective/Chief Complaint: No overnight event.s    Objective: Vital signs in last 24 hours: Temp:  [97.5 F (36.4 C)-98.9 F (37.2 C)] 98.3 F (36.8 C) (09/26 0830) Pulse Rate:  [86-92] 91 (09/26 0830) Resp:  [16-20] 16 (09/26 0830) BP: (150-168)/(58-68) 159/58 (09/26 0830) SpO2:  [95 %-100 %] 97 % (09/26 0830) Last BM Date : 08/07/24  Intake/Output from previous day: 09/25 0701 - 09/26 0700 In: 1274 [P.O.:474; NG/GT:800] Out: 3190 [Urine:2700; Drains:490] Intake/Output this shift: No intake/output data recorded.  General: alert, no distress Resp: No respiratory distress with normal rate Abd:  soft, midline wound with steristrips. Some drainage lower midline. Drain #2 with scant drainage, but still a little purulent. Getting thinner.  Hold on pulling for now.  Drain #1 panc fluid.  Ext:  SCDs in place  Lab Results:  Recent Labs    08/06/24 0600 08/07/24 0213  WBC 8.9 8.8  HGB 8.8* 9.4*  HCT 27.3* 29.8*  PLT 520* 542*    BMET Recent Labs    08/06/24 0600 08/07/24 0213  NA 137 134*  K 3.9 3.8  CL 108 105  CO2 23 19*  GLUCOSE 130* 181*  BUN 11 10  CREATININE 0.98 1.01*  CALCIUM  7.3* 7.5*   PT/INR No results for input(s): LABPROT, INR in the last 72 hours.   ABG No results for input(s): PHART, HCO3 in the last 72 hours.  Invalid input(s): PCO2, PO2   Studies/Results: No results found.     Anti-infectives: Anti-infectives (From admission, onward)    Start     Dose/Rate Route Frequency Ordered Stop   08/07/24 0000  vancomycin  (VANCOCIN ) IVPB 1000 mg/200 mL premix  Status:  Discontinued        1,000 mg 200 mL/hr over 60 Minutes Intravenous  Once 08/06/24 1106 08/06/24 1346   08/06/24 2300  vancomycin  (VANCOCIN ) IVPB 1000 mg/200 mL premix        1,000 mg 200 mL/hr over 60 Minutes Intravenous Every 12 hours 08/06/24 1346 08/10/24 1059   08/06/24 1200  vancomycin  (VANCOCIN ) IVPB 1000 mg/200 mL premix  Status:   Discontinued        1,000 mg 200 mL/hr over 60 Minutes Intravenous  Once 08/06/24 1105 08/06/24 1106   08/02/24 1400  piperacillin -tazobactam (ZOSYN ) IVPB 3.375 g        3.375 g 12.5 mL/hr over 240 Minutes Intravenous Every 8 hours 08/01/24 2238 08/09/24 2359   08/01/24 2330  cefTRIAXone  (ROCEPHIN ) injection 1 g       Placed in And Linked Group   1 g Intramuscular  Once 08/01/24 2238 08/02/24 0015   08/01/24 1800  vancomycin  (VANCOCIN ) IVPB 1000 mg/200 mL premix  Status:  Discontinued        1,000 mg 200 mL/hr over 60 Minutes Intravenous Every 12 hours 08/01/24 1642 08/06/24 1346   08/01/24 1047  vancomycin  variable dose per unstable renal function (pharmacist dosing)  Status:  Discontinued         Does not apply See admin instructions 08/01/24 1047 08/01/24 1646   07/30/24 2300  vancomycin  (VANCOCIN ) IVPB 1000 mg/200 mL premix  Status:  Discontinued        1,000 mg 200 mL/hr over 60 Minutes Intravenous Every 12 hours 07/30/24 1029 08/01/24 1047   07/30/24 1400  piperacillin -tazobactam (ZOSYN ) IVPB 3.375 g  Status:  Discontinued        3.375 g 12.5 mL/hr over 240 Minutes Intravenous Every 8 hours 07/30/24 1225 08/01/24 2238  07/30/24 1115  fluconazole  (DIFLUCAN ) IVPB 400 mg        400 mg 100 mL/hr over 120 Minutes Intravenous Every 24 hours 07/30/24 1025 08/10/24 0959   07/30/24 1100  vancomycin  (VANCOREADY) IVPB 2000 mg/400 mL        2,000 mg 200 mL/hr over 120 Minutes Intravenous  Once 07/30/24 1010 07/30/24 1540   07/28/24 0945  piperacillin -tazobactam (ZOSYN ) IVPB 3.375 g  Status:  Discontinued        3.375 g 12.5 mL/hr over 240 Minutes Intravenous Every 8 hours 07/28/24 0853 07/30/24 1131   07/23/24 0000  ceFAZolin  (ANCEF ) IVPB 2g/100 mL premix        2 g 200 mL/hr over 30 Minutes Intravenous Every 8 hours 07/22/24 2314 07/23/24 0058   07/22/24 0900  ceFAZolin  (ANCEF ) IVPB 2g/100 mL premix        2 g 200 mL/hr over 30 Minutes Intravenous To ShortStay Surgical 07/21/24 1938  07/22/24 1432       Assessment/Plan: s/p Procedure(s): WHIPPLE PROCEDURE (N/A) CREATION, JEJUNOSTOMY (N/A)- Lauren Gray 07/22/24 POD 17- Obstructing 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  pT3bN1 final path.   Prn morphine , oxy, robaxin , tylenol  T bili down to normal.  AKI, creatinine stable ABL anemia, anemia chronic disease. 2 u PRBC on 9/9  Zosyn /fluc/vanc for panc leak and pneumonia. 10 days broad spectrum antibiotics from when vanc and fluc added.  Stop 9/27.   Full liquids orally.   Minimal calorie intake from this at this point.  Try toast and crackers (pancake/waffles) over the weekend.  Pt with food fear which is normal at this point.  Consider soft diet Monday if tolerates toast/crackers.   TF now at goal. Continue fiber.  Continue imodium . Cycling tube feeds.   18 h cycle for several days.  Start 16 hour cycle over the weekend.    Start 1 cap creon  tid at this point with full liquids.   Better stool consistency.    PT/OT - she is working with therapies well, walking from bed to chair with modA +2. Hope for rehab admit.      LOS: 35 days    Lauren Gray Nephew, MD, FACS, FSSO Surgical Oncology, General Surgery, Trauma and Critical [facility], [place] [phone] for weekday/non holidays Check [email or web address] for coverage night/weekend/holidays

## 2024-08-08 NOTE — TOC Progression Note (Signed)
 Transition of Care [facility]) - Progression Note    Patient Details  Name: Lauren Gray MRN: [id number] Date of Birth: October 26, 1951  Transition of Care [facility]) CM/SW Contact  Luise JAYSON Pan, [place] Phone Number: 08/08/2024, 8:50 AM  Clinical Narrative:   CIR following.   CSW will continue to monitor and follow.   Expected Discharge Plan: IP Rehab Facility Barriers to Discharge: English as a second language teacher, Continued Medical Work up               Expected Discharge Plan and Services       Living arrangements for the past 2 months: Single Family Home                                       Social Drivers of Health (SDOH) Interventions SDOH Screenings   Food Insecurity: No Food Insecurity (07/20/2024)  Housing: Unknown (07/20/2024)  Transportation Needs: No Transportation Needs (07/20/2024)  Utilities: Not At Risk (07/20/2024)  Depression (PHQ2-9): Low Risk  (06/18/2024)  Social Connections: Socially Integrated (07/20/2024)  Tobacco Use: Low Risk  (07/22/2024)    Readmission Risk Interventions    07/07/2024   12:30 PM  Readmission Risk Prevention Plan  Post Dischage Appt Complete  Medication Screening Complete  Transportation Screening Complete

## 2024-08-09 DIAGNOSIS — K56609 Unspecified intestinal obstruction, unspecified as to partial versus complete obstruction: Secondary | ICD-10-CM | POA: Diagnosis not present

## 2024-08-09 LAB — CBC
HCT: 28.7 % — ABNORMAL LOW (ref 36.0–46.0)
Hemoglobin: 8.9 g/dL — ABNORMAL LOW (ref 12.0–15.0)
MCH: 28.9 pg (ref 26.0–34.0)
MCHC: 31 g/dL (ref 30.0–36.0)
MCV: 93.2 fL (ref 80.0–100.0)
Platelets: 496 K/uL — ABNORMAL HIGH (ref 150–400)
RBC: 3.08 MIL/uL — ABNORMAL LOW (ref 3.87–5.11)
RDW: 15.9 % — ABNORMAL HIGH (ref 11.5–15.5)
WBC: 7.4 K/uL (ref 4.0–10.5)
nRBC: 0 % (ref 0.0–0.2)

## 2024-08-09 LAB — GLUCOSE, CAPILLARY
Glucose-Capillary: 218 mg/dL — ABNORMAL HIGH (ref 70–99)
Glucose-Capillary: 160 mg/dL — ABNORMAL HIGH (ref 70–99)
Glucose-Capillary: 87 mg/dL (ref 70–99)
Glucose-Capillary: 175 mg/dL — ABNORMAL HIGH (ref 70–99)

## 2024-08-09 LAB — BASIC METABOLIC PANEL WITH GFR
Anion gap: 7 (ref 5–15)
BUN: 10 mg/dL (ref 8–23)
CO2: 20 mmol/L — ABNORMAL LOW (ref 22–32)
Calcium: 7.6 mg/dL — ABNORMAL LOW (ref 8.9–10.3)
Chloride: 107 mmol/L (ref 98–111)
Creatinine, Ser: 0.88 mg/dL (ref 0.44–1.00)
GFR, Estimated: >60 mL/min (ref >60)
Glucose, Bld: 220 mg/dL — ABNORMAL HIGH (ref 70–99)
Potassium: 4.1 mmol/L (ref 3.5–5.1)
Sodium: 134 mmol/L — ABNORMAL LOW (ref 135–145)

## 2024-08-09 NOTE — Plan of Care (Signed)
  Problem: Education: Goal: Knowledge of General Education information will improve Description: Including pain rating scale, medication(s)/side effects and non-pharmacologic comfort measures Outcome: Progressing   Problem: Nutrition: Goal: Adequate nutrition will be maintained Outcome: Progressing   Problem: Elimination: Goal: Will not experience complications related to bowel motility Outcome: Progressing   Problem: Pain Managment: Goal: General experience of comfort will improve and/or be controlled Outcome: Progressing

## 2024-08-09 NOTE — Progress Notes (Signed)
 Lauren Gray  FMW:[id number] DOB: Mar 14, 1951 DOA: 07/04/2024 PCP: Juanice Thomes SAUNDERS, FNP    Brief Narrative:  73 year old with a history of HLD, pre-DM, and CKD stage III who presented to the hospital 07/04/2024 with a 1 month history of persistent vomiting with inability to tolerate anything other than a small volume of yogurt or crackers.  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  Following admission and an extensive evaluation she underwent a Whipple procedure 07/22/2024.  Goals of Care:   Code Status: Full Code   DVT prophylaxis: heparin  injection 5,000 Units Start: 08/05/24 0600 SCD's Start: 07/22/24 2315   Interim Hx: Afebrile.  Vital signs stable.  Blood pressure remains modestly elevated.  CBG variable at 81-218.  Resting comfortably in bed.  Reports the onset of diarrhea which is proving quite bothersome.  No chest pain or shortness of breath.  Assessment & Plan:  Duodenal adenocarcinoma with gastric outlet obstruction CT scan showed dilated 1st and 2nd portion of duodenum with possible underlying mass - 8/24 underwent small bowel enteroscopy - duodenal mass biopsy was positive for adenocarcinoma - due to poor nutritional status patient was started on TPN - Whipple procedure performed 07/22/2024 - CT abdomen and chest on 9/17 shows no new changes -ongoing care per General Surgery   Bilateral pleural effusion - Multifocal pneumonia CT chest noted evidence of bilateral pleural effusion likely third spacing in the setting of hypoalbuminemia - underwent thoracentesis yielding 250 mL fluid > exudative based on LDH - cytology negative for malignant cells - has completed abx course for potential PNA   Normocytic anemia / postop blood loss anemia with a combination of dilution from IV fluids 1 PRBC transfusion on 9/19 followed by 2 more PRBC transfusion - Hgb stable presently   Recent Labs  Lab 08/04/24 0740 08/05/24 0412 08/06/24 0600  08/07/24 0213 08/09/24 0430  HGB 8.7* 8.7* 8.8* 9.4* 8.9*    Essential hypertension Blood pressure remains modestly elevated -no indication for acute lowering - follow trend for now  Prediabetes with hyperglycemia A1c 6.1 - CBG variable with increasing oral intake and continued tube feeds -avoid hypoglycemia - no changes in treatment for today   AKI on CKD 2 Baseline creatinine normal - admission serum creatinine was 1.35 - creatinine has normalized with support   Anxiety and insomnia Trazodone  at nighttime and Valium  as needed during the day  Obesity Class 3 - Body mass index is 40.56 kg/m.   Hypernatremia Resolved with free water  expansion   LFT elevation Has resolved with supportive care   Bilateral edema third spacing - compression stocking ordered - SCD recommended     Family Communication: Spoke with husband at bedside Disposition:   Acute Inpatient Rehab (3hours/Day)08/07/2024 1317  Objective: Blood pressure (!) 157/60, pulse 84, temperature 98.4 F (36.9 C), temperature source Oral, resp. rate 20, height 5' 8 (1.727 m), weight 121 kg, SpO2 97%.  Intake/Output Summary (Last 24 hours) at 08/09/2024 1034 Last data filed at 08/09/2024 0800 Gross per 24 hour  Intake 4243.54 ml  Output 2080 ml  Net 2163.54 ml   Filed Weights   08/02/24 0600 08/04/24 0222 08/06/24 0418  Weight: 123 kg 124 kg 121 kg    Examination: General: No acute respiratory distress Lungs: Clear to auscultation bilaterally  Cardiovascular: Regular rate and rhythm without murmur  Abdomen: Nontender, nondistended, soft Extremities: No significant edema B LE   CBC: Recent Labs  Lab 08/04/24 0740  08/05/24 0412 08/06/24 0600 08/07/24 0213 08/09/24 0430  WBC 12.0* 10.7* 8.9 8.8 7.4  NEUTROABS 9.2* 8.3* 6.6  --   --   HGB 8.7* 8.7* 8.8* 9.4* 8.9*  HCT 27.0* 28.0* 27.3* 29.8* 28.7*  MCV 94.1 93.3 91.9 92.8 93.2  PLT 511* 507* 520* 542* 496*   Basic Metabolic Panel: Recent Labs  Lab  08/05/24 0412 08/06/24 0600 08/07/24 0213 08/09/24 0430  NA 139 137 134* 134*  K 4.0 3.9 3.8 4.1  CL 109 108 105 107  CO2 23 23 19* 20*  GLUCOSE 179* 130* 181* 220*  BUN 14 11 10 10   CREATININE 1.19* 0.98 1.01* 0.88  CALCIUM  7.3* 7.3* 7.5* 7.6*  MG 1.9 1.7 1.8  --   PHOS  --   --  2.2*  --    GFR: Estimated Creatinine Clearance: 77.9 mL/min (by C-G formula based on SCr of 0.88 mg/dL).   Scheduled Meds:  amLODipine   10 mg Oral Daily   Chlorhexidine  Gluconate Cloth  6 each Topical Q0600   feeding supplement (OSMOLITE 1.5 CAL)  1,000 mL Per Tube Q24H   feeding supplement (PROSource TF20)  60 mL Per Tube BID   fiber supplement (BANATROL TF)  60 mL Per Tube BID   fluticasone   2 spray Each Nare Daily   free water   100 mL Per Tube Q4H   gabapentin   100 mg Per Tube Q8H   heparin  injection (subcutaneous)  5,000 Units Subcutaneous Q8H   insulin  aspart  0-5 Units Subcutaneous QHS   insulin  aspart  0-9 Units Subcutaneous TID WC   insulin  aspart  3 Units Subcutaneous TID WC   insulin  glargine  10 Units Subcutaneous Daily   lipase/protease/amylase  24,000 Units Oral TID AC   loperamide   2 mg Oral TID   methocarbamol  (ROBAXIN ) injection  500 mg Intravenous Q8H   metoprolol  tartrate  100 mg Oral BID   pantoprazole  (PROTONIX ) IV  40 mg Intravenous Q12H   traZODone   50 mg Per Tube QHS   Continuous Infusions:  fluconazole  (DIFLUCAN ) IV 400 mg (08/09/24 1021)   piperacillin -tazobactam (ZOSYN )  IV 3.375 g (08/09/24 0525)   vancomycin  1,000 mg (08/08/24 2329)     LOS: 36 days   Reyes IVAR Moores, MD [facility] Office  [phone] Pager - Text Page per Tracey  If 7PM-7AM, please contact night-coverage per Amion 08/09/2024, 10:34 AM

## 2024-08-09 NOTE — Plan of Care (Signed)
  Problem: Education: Goal: Knowledge of General Education information will improve Description: Including pain rating scale, medication(s)/side effects and non-pharmacologic comfort measures Outcome: Progressing   Problem: Clinical Measurements: Goal: Ability to maintain clinical measurements within normal limits will improve Outcome: Progressing Goal: Will remain free from infection Outcome: Progressing Goal: Diagnostic test results will improve Outcome: Progressing Goal: Respiratory complications will improve Outcome: Progressing Goal: Cardiovascular complication will be avoided Outcome: Progressing   Problem: Activity: Goal: Risk for activity intolerance will decrease Outcome: Progressing   Problem: Nutrition: Goal: Adequate nutrition will be maintained Outcome: Progressing   Problem: Coping: Goal: Level of anxiety will decrease Outcome: Progressing   Problem: Elimination: Goal: Will not experience complications related to bowel motility Outcome: Progressing Goal: Will not experience complications related to urinary retention Outcome: Progressing   Problem: Pain Managment: Goal: General experience of comfort will improve and/or be controlled Outcome: Progressing   Problem: Safety: Goal: Ability to remain free from injury will improve Outcome: Progressing   Problem: Skin Integrity: Goal: Risk for impaired skin integrity will decrease Outcome: Progressing   Problem: Education: Goal: Ability to describe self-care measures that may prevent or decrease complications (Diabetes Survival Skills Education) will improve Outcome: Progressing Goal: Individualized Educational Video(s) Outcome: Progressing   Problem: Coping: Goal: Ability to adjust to condition or change in health will improve Outcome: Progressing   Problem: Fluid Volume: Goal: Ability to maintain a balanced intake and output will improve Outcome: Progressing   Problem: Health Behavior/Discharge  Planning: Goal: Ability to identify and utilize available resources and services will improve Outcome: Progressing Goal: Ability to manage health-related needs will improve Outcome: Progressing   Problem: Metabolic: Goal: Ability to maintain appropriate glucose levels will improve Outcome: Progressing   Problem: Nutritional: Goal: Maintenance of adequate nutrition will improve Outcome: Progressing Goal: Progress toward achieving an optimal weight will improve Outcome: Progressing   Problem: Skin Integrity: Goal: Risk for impaired skin integrity will decrease Outcome: Progressing   Problem: Tissue Perfusion: Goal: Adequacy of tissue perfusion will improve Outcome: Progressing

## 2024-08-09 NOTE — Progress Notes (Signed)
 Progress Note  18 Days Post-Op  Subjective: Patient denies pain. Having bowel movements and flatus. Denies nausea and vomiting. Tolerating FLD.   ROS  All negative with the exception of above.  Objective: Vital signs in last 24 hours: Temp:  [97.7 F (36.5 C)-98.7 F (37.1 C)] 98.7 F (37.1 C) (09/27 0457) Pulse Rate:  [82-89] 82 (09/27 0457) Resp:  [16-18] 18 (09/27 0457) BP: (149-166)/(58-69) 152/58 (09/27 0457) SpO2:  [91 %-100 %] 91 % (09/27 0457) Last BM Date : 08/08/24  Intake/Output from previous day: 09/26 0701 - 09/27 0700 In: 4483.5 [P.O.:720; NG/GT:600; IV Piggyback:3163.5] Out: 2080 [Urine:1800; Drains:280] Intake/Output this shift: No intake/output data recorded.  PE: General: Pleasant female who is laying in bed in NAD. HEENT: Head is normocephalic, atraumatic.  Heart: HR normal Lungs: Respiratory effort nonlabored Abd: Soft, NT, mild distention. Midline incision present with steristrips. Lower portion of midline wound noted to have some drainage. No active bleeding, erythema of surrounding skin, significant amount of purulence. Drain#1 with ~ 100 cc of clear/white fluid. Drain #2 with minimal white/clear drainage. No guarding. Psych: A&Ox3 with an appropriate affect.    Lab Results:  Recent Labs    08/07/24 0213 08/09/24 0430  WBC 8.8 7.4  HGB 9.4* 8.9*  HCT 29.8* 28.7*  PLT 542* 496*   BMET Recent Labs    08/07/24 0213 08/09/24 0430  NA 134* 134*  K 3.8 4.1  CL 105 107  CO2 19* 20*  GLUCOSE 181* 220*  BUN 10 10  CREATININE 1.01* 0.88  CALCIUM  7.5* 7.6*   PT/INR No results for input(s): LABPROT, INR in the last 72 hours. CMP     Component Value Date/Time   NA 134 (L) 08/09/2024 0430   NA 142 06/18/2024 1445   K 4.1 08/09/2024 0430   CL 107 08/09/2024 0430   CO2 20 (L) 08/09/2024 0430   GLUCOSE 220 (H) 08/09/2024 0430   BUN 10 08/09/2024 0430   BUN 14 06/18/2024 1445   CREATININE 0.88 08/09/2024 0430   CREATININE 1.12  (H) 12/27/2021 0846   CALCIUM  7.6 (L) 08/09/2024 0430   PROT 5.8 (L) 08/06/2024 0600   PROT 7.2 06/18/2024 1445   ALBUMIN  <1.5 (L) 08/06/2024 0600   ALBUMIN  4.3 06/18/2024 1445   AST 17 08/06/2024 0600   ALT 29 08/06/2024 0600   ALKPHOS 98 08/06/2024 0600   BILITOT 0.8 08/06/2024 0600   BILITOT 0.4 06/18/2024 1445   GFRNONAA >60 08/09/2024 0430   Lipase     Component Value Date/Time   LIPASE 34 07/06/2024 0441       Studies/Results: No results found.  Anti-infectives: Anti-infectives (From admission, onward)    Start     Dose/Rate Route Frequency Ordered Stop   08/07/24 0000  vancomycin  (VANCOCIN ) IVPB 1000 mg/200 mL premix  Status:  Discontinued        1,000 mg 200 mL/hr over 60 Minutes Intravenous  Once 08/06/24 1106 08/06/24 1346   08/06/24 2300  vancomycin  (VANCOCIN ) IVPB 1000 mg/200 mL premix        1,000 mg 200 mL/hr over 60 Minutes Intravenous Every 12 hours 08/06/24 1346 08/10/24 1059   08/06/24 1200  vancomycin  (VANCOCIN ) IVPB 1000 mg/200 mL premix  Status:  Discontinued        1,000 mg 200 mL/hr over 60 Minutes Intravenous  Once 08/06/24 1105 08/06/24 1106   08/02/24 1400  piperacillin -tazobactam (ZOSYN ) IVPB 3.375 g        3.375 g 12.5 mL/hr over  240 Minutes Intravenous Every 8 hours 08/01/24 2238 08/09/24 2359   08/01/24 2330  cefTRIAXone  (ROCEPHIN ) injection 1 g       Placed in And Linked Group   1 g Intramuscular  Once 08/01/24 2238 08/02/24 0015   08/01/24 1800  vancomycin  (VANCOCIN ) IVPB 1000 mg/200 mL premix  Status:  Discontinued        1,000 mg 200 mL/hr over 60 Minutes Intravenous Every 12 hours 08/01/24 1642 08/06/24 1346   08/01/24 1047  vancomycin  variable dose per unstable renal function (pharmacist dosing)  Status:  Discontinued         Does not apply See admin instructions 08/01/24 1047 08/01/24 1646   07/30/24 2300  vancomycin  (VANCOCIN ) IVPB 1000 mg/200 mL premix  Status:  Discontinued        1,000 mg 200 mL/hr over 60 Minutes  Intravenous Every 12 hours 07/30/24 1029 08/01/24 1047   07/30/24 1400  piperacillin -tazobactam (ZOSYN ) IVPB 3.375 g  Status:  Discontinued        3.375 g 12.5 mL/hr over 240 Minutes Intravenous Every 8 hours 07/30/24 1225 08/01/24 2238   07/30/24 1115  fluconazole  (DIFLUCAN ) IVPB 400 mg        400 mg 100 mL/hr over 120 Minutes Intravenous Every 24 hours 07/30/24 1025 08/10/24 0959   07/30/24 1100  vancomycin  (VANCOREADY) IVPB 2000 mg/400 mL        2,000 mg 200 mL/hr over 120 Minutes Intravenous  Once 07/30/24 1010 07/30/24 1540   07/28/24 0945  piperacillin -tazobactam (ZOSYN ) IVPB 3.375 g  Status:  Discontinued        3.375 g 12.5 mL/hr over 240 Minutes Intravenous Every 8 hours 07/28/24 0853 07/30/24 1131   07/23/24 0000  ceFAZolin  (ANCEF ) IVPB 2g/100 mL premix        2 g 200 mL/hr over 30 Minutes Intravenous Every 8 hours 07/22/24 2314 07/23/24 0058   07/22/24 0900  ceFAZolin  (ANCEF ) IVPB 2g/100 mL premix        2 g 200 mL/hr over 30 Minutes Intravenous To [facility] Surgical 07/21/24 1938 07/22/24 1432        Assessment/Plan POD18: S/P Whipple procedure creation, jejunostomy by Dr. Aron 07/22/2024.  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  -Hypertensive but stable compared to trends. Afebrile. -WBC 7.4; HGB 8.9 -Cr 0.88 from 1.01 -Continue pain regimen. -Continue imodium  and fiber. -Continue antibiotics. Can be discontinued today 9/27. -Continue PT/OT. -Continue full liquid diet through the weekend. TF are at goal. Will plan for 16 hour cycle 9/28 from 18 h. - Maintain drains. Total output 120 mL from drain 1 and <25 ml of drain 2. - Will continue to follow.  FEN: FLD; TF VTE: Heparin  injection ID: vanc/zosyn /diflucan ; Can be discontinued 9/27    LOS: 36 days   I reviewed hospitalist notes, nursing notes, last 24 h vitals and pain scores, last 48 h intake and output, last 24 h labs and trends, and last 24 h imaging  results.   Marjorie Carlyon Favre, [facility] Surgery 08/09/2024, 9:11 AM Please see Amion for pager number during day hours 7:00am-4:30pm

## 2024-08-10 DIAGNOSIS — K56609 Unspecified intestinal obstruction, unspecified as to partial versus complete obstruction: Secondary | ICD-10-CM | POA: Diagnosis not present

## 2024-08-10 LAB — CBC
HCT: 33.6 % — ABNORMAL LOW (ref 36.0–46.0)
Hemoglobin: 10.4 g/dL — ABNORMAL LOW (ref 12.0–15.0)
MCH: 28.7 pg (ref 26.0–34.0)
MCHC: 31 g/dL (ref 30.0–36.0)
MCV: 92.8 fL (ref 80.0–100.0)
Platelets: 611 K/uL — ABNORMAL HIGH (ref 150–400)
RBC: 3.62 MIL/uL — ABNORMAL LOW (ref 3.87–5.11)
RDW: 15.7 % — ABNORMAL HIGH (ref 11.5–15.5)
WBC: 9.4 K/uL (ref 4.0–10.5)
nRBC: 0 % (ref 0.0–0.2)

## 2024-08-10 LAB — BASIC METABOLIC PANEL WITH GFR
Anion gap: 11 (ref 5–15)
BUN: 13 mg/dL (ref 8–23)
CO2: 19 mmol/L — ABNORMAL LOW (ref 22–32)
Calcium: 8 mg/dL — ABNORMAL LOW (ref 8.9–10.3)
Chloride: 106 mmol/L (ref 98–111)
Creatinine, Ser: 0.97 mg/dL (ref 0.44–1.00)
GFR, Estimated: >60 mL/min (ref >60)
Glucose, Bld: 249 mg/dL — ABNORMAL HIGH (ref 70–99)
Potassium: 4.4 mmol/L (ref 3.5–5.1)
Sodium: 136 mmol/L (ref 135–145)

## 2024-08-10 LAB — GLUCOSE, CAPILLARY
Glucose-Capillary: 224 mg/dL — ABNORMAL HIGH (ref 70–99)
Glucose-Capillary: 247 mg/dL — ABNORMAL HIGH (ref 70–99)
Glucose-Capillary: 177 mg/dL — ABNORMAL HIGH (ref 70–99)
Glucose-Capillary: 236 mg/dL — ABNORMAL HIGH (ref 70–99)

## 2024-08-10 MED ORDER — OSMOLITE 1.5 CAL PO LIQD
1312.0000 mL | ORAL | Status: DC
  Administered 2024-08-10: 1312 mL

## 2024-08-10 MED ORDER — TRAMADOL HCL 50 MG PO TABS: 50.0000 mg | ORAL_TABLET | Freq: Two times a day (BID) | ORAL | Status: DC | PRN

## 2024-08-10 MED ORDER — PANTOPRAZOLE SODIUM 40 MG PO TBEC
40.0000 mg | DELAYED_RELEASE_TABLET | Freq: Two times a day (BID) | ORAL | Status: DC
  Administered 2024-08-10 – 2024-08-15 (×10): 40 mg via ORAL
  Filled 2024-08-10 (×10): qty 1

## 2024-08-10 MED ORDER — MORPHINE SULFATE (PF) 2 MG/ML IV SOLN: 2.0000 mg | INTRAVENOUS | Status: DC | PRN

## 2024-08-10 MED ORDER — FREE WATER
100.0000 mL | Freq: Three times a day (TID) | Status: DC
  Administered 2024-08-10 – 2024-08-15 (×16): 100 mL

## 2024-08-10 MED ORDER — PROSOURCE TF20 ENFIT COMPATIBL EN LIQD
60.0000 mL | Freq: Every day | ENTERAL | Status: DC
  Administered 2024-08-11 – 2024-08-15 (×5): 60 mL
  Filled 2024-08-10 (×5): qty 60

## 2024-08-10 MED ORDER — SODIUM CHLORIDE 0.9 % IV SOLN
12.5000 mg | Freq: Four times a day (QID) | INTRAVENOUS | Status: DC | PRN
  Administered 2024-08-10: 12.5 mg via INTRAVENOUS
  Filled 2024-08-10: qty 12.5

## 2024-08-10 MED ORDER — HYDROCODONE-ACETAMINOPHEN 5-325 MG PO TABS: 1.0000 | ORAL_TABLET | Freq: Four times a day (QID) | ORAL | Status: DC | PRN

## 2024-08-10 MED ORDER — CARVEDILOL 3.125 MG PO TABS
3.1250 mg | ORAL_TABLET | Freq: Two times a day (BID) | ORAL | Status: DC
  Administered 2024-08-10 – 2024-08-11 (×2): 3.125 mg via ORAL
  Filled 2024-08-10 (×2): qty 1

## 2024-08-10 NOTE — Plan of Care (Signed)
  Problem: Education: Goal: Knowledge of General Education information will improve Description: Including pain rating scale, medication(s)/side effects and non-pharmacologic comfort measures Outcome: Progressing   Problem: Clinical Measurements: Goal: Ability to maintain clinical measurements within normal limits will improve Outcome: Progressing Goal: Will remain free from infection Outcome: Progressing Goal: Diagnostic test results will improve Outcome: Progressing Goal: Respiratory complications will improve Outcome: Progressing Goal: Cardiovascular complication will be avoided Outcome: Progressing   Problem: Activity: Goal: Risk for activity intolerance will decrease Outcome: Progressing   Problem: Nutrition: Goal: Adequate nutrition will be maintained Outcome: Progressing   Problem: Coping: Goal: Level of anxiety will decrease Outcome: Progressing   Problem: Elimination: Goal: Will not experience complications related to bowel motility Outcome: Progressing Goal: Will not experience complications related to urinary retention Outcome: Progressing   Problem: Pain Managment: Goal: General experience of comfort will improve and/or be controlled Outcome: Progressing   Problem: Safety: Goal: Ability to remain free from injury will improve Outcome: Progressing   Problem: Skin Integrity: Goal: Risk for impaired skin integrity will decrease Outcome: Progressing   Problem: Education: Goal: Ability to describe self-care measures that may prevent or decrease complications (Diabetes Survival Skills Education) will improve Outcome: Progressing Goal: Individualized Educational Video(s) Outcome: Progressing   Problem: Coping: Goal: Ability to adjust to condition or change in health will improve Outcome: Progressing   Problem: Fluid Volume: Goal: Ability to maintain a balanced intake and output will improve Outcome: Progressing   Problem: Health Behavior/Discharge  Planning: Goal: Ability to identify and utilize available resources and services will improve Outcome: Progressing Goal: Ability to manage health-related needs will improve Outcome: Progressing   Problem: Metabolic: Goal: Ability to maintain appropriate glucose levels will improve Outcome: Progressing   Problem: Nutritional: Goal: Maintenance of adequate nutrition will improve Outcome: Progressing Goal: Progress toward achieving an optimal weight will improve Outcome: Progressing   Problem: Skin Integrity: Goal: Risk for impaired skin integrity will decrease Outcome: Progressing   Problem: Tissue Perfusion: Goal: Adequacy of tissue perfusion will improve Outcome: Progressing

## 2024-08-10 NOTE — Progress Notes (Signed)
 Lauren MACWILLIAMS  FMW:[id number] DOB: October 15, 1951 DOA: 07/04/2024 PCP: Juanice Thomes SAUNDERS, FNP    Brief Narrative:  73 year old with a history of HLD, pre-DM, and CKD stage III who presented to the hospital 07/04/2024 with a 1 month history of persistent vomiting with inability to tolerate anything other than a small volume of yogurt or crackers.  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  Following admission and an extensive evaluation she underwent a Whipple procedure 07/22/2024.  Goals of Care:   Code Status: Full Code   DVT prophylaxis: heparin  injection 5,000 Units Start: 08/05/24 0600 SCD's Start: 07/22/24 2315   Interim Hx: No acute events recorded overnight.  Afebrile.  Blood pressure moderately elevated with systolics 147-169.  CBG variable at 87-224.  The patient was experiencing some musculoskeletal chest pain today, for which she took pain medication.  After taking the pain medication unfortunately she suffered significant nausea.  She denies chest pain or shortness of breath.  Assessment & Plan:  Duodenal adenocarcinoma with gastric outlet obstruction CT scan showed dilated 1st and 2nd portion of duodenum with possible underlying mass - 8/24 underwent small bowel enteroscopy - duodenal mass biopsy was positive for adenocarcinoma - due to poor nutritional status patient was started on TPN - Whipple procedure performed 07/22/2024 - CT abdomen and chest on 9/17 noted no new changes -ongoing care per General Surgery   Bilateral pleural effusion - Multifocal pneumonia - resolved  CT chest noted evidence of bilateral pleural effusion likely third spacing in the setting of hypoalbuminemia - underwent thoracentesis yielding 250 mL fluid > exudative based on LDH - cytology negative for malignant cells - has completed abx course for potential PNA -oxygen saturation is 97% on room air   Normocytic anemia / postop blood loss anemia with a combination  of dilution from IV fluids Has received a total of 5 units of PRBC during this admission - Hgb stable presently without evidence of active bleeding  Recent Labs  Lab 08/04/24 0740 08/05/24 0412 08/06/24 0600 08/07/24 0213 08/09/24 0430  HGB 8.7* 8.7* 8.8* 9.4* 8.9*    Essential hypertension Blood pressure remains modestly elevated -no indication for acute rapid lowering -gently adjust medications again today and follow  Prediabetes with hyperglycemia A1c 6.1 - CBG variable with increasing oral intake and continued tube feeds -avoid hypoglycemia - no changes in treatment for today   AKI on CKD 2 Baseline creatinine normal - admission serum creatinine was 1.35 - creatinine has normalized with support   Anxiety and insomnia Trazodone  at nighttime and Valium  as needed during the day  Obesity Class 3 - Body mass index is 40.56 kg/m.   Hypernatremia Resolved with free water  expansion   LFT elevation Has resolved with supportive care   Bilateral edema third spacing - compression stocking ordered - SCD recommended     Family Communication: Spoke with patient and multiple family members at bedside Disposition:   Acute Inpatient Rehab (3hours/Day)08/07/2024 1317  Objective: Blood pressure (!) 169/59, pulse 87, temperature 98.2 F (36.8 C), resp. rate 16, height 5' 8 (1.727 m), weight 121 kg, SpO2 97%.  Intake/Output Summary (Last 24 hours) at 08/10/2024 1038 Last data filed at 08/10/2024 0800 Gross per 24 hour  Intake 1060 ml  Output 4960 ml  Net -3900 ml   Filed Weights   08/04/24 0222 08/06/24 0418 08/10/24 0500  Weight: 124 kg 121 kg 121 kg    Examination: General: No acute  respiratory distress Lungs: Clear to auscultation bilaterally  Cardiovascular: Regular rate and rhythm without murmur  Abdomen: Nontender, nondistended, soft Extremities: Trace bilateral lower extremity edema sitting up on side of bed  CBC: Recent Labs  Lab 08/04/24 0740 08/05/24 0412  08/06/24 0600 08/07/24 0213 08/09/24 0430  WBC 12.0* 10.7* 8.9 8.8 7.4  NEUTROABS 9.2* 8.3* 6.6  --   --   HGB 8.7* 8.7* 8.8* 9.4* 8.9*  HCT 27.0* 28.0* 27.3* 29.8* 28.7*  MCV 94.1 93.3 91.9 92.8 93.2  PLT 511* 507* 520* 542* 496*   Basic Metabolic Panel: Recent Labs  Lab 08/05/24 0412 08/06/24 0600 08/07/24 0213 08/09/24 0430  NA 139 137 134* 134*  K 4.0 3.9 3.8 4.1  CL 109 108 105 107  CO2 23 23 19* 20*  GLUCOSE 179* 130* 181* 220*  BUN 14 11 10 10   CREATININE 1.19* 0.98 1.01* 0.88  CALCIUM  7.3* 7.3* 7.5* 7.6*  MG 1.9 1.7 1.8  --   PHOS  --   --  2.2*  --    GFR: Estimated Creatinine Clearance: 77.9 mL/min (by C-G formula based on SCr of 0.88 mg/dL).   Scheduled Meds:  amLODipine   10 mg Oral Daily   Chlorhexidine  Gluconate Cloth  6 each Topical Q0600   feeding supplement (OSMOLITE 1.5 CAL)  1,000 mL Per Tube Q24H   feeding supplement (PROSource TF20)  60 mL Per Tube BID   fiber supplement (BANATROL TF)  60 mL Per Tube BID   fluticasone   2 spray Each Nare Daily   free water   100 mL Per Tube Q4H   gabapentin   100 mg Per Tube Q8H   heparin  injection (subcutaneous)  5,000 Units Subcutaneous Q8H   insulin  aspart  0-5 Units Subcutaneous QHS   insulin  aspart  0-9 Units Subcutaneous TID WC   insulin  aspart  3 Units Subcutaneous TID WC   insulin  glargine  10 Units Subcutaneous Daily   lipase/protease/amylase  24,000 Units Oral TID AC   loperamide   2 mg Oral TID   methocarbamol  (ROBAXIN ) injection  500 mg Intravenous Q8H   metoprolol  tartrate  100 mg Oral BID   pantoprazole  (PROTONIX ) IV  40 mg Intravenous Q12H   traZODone   50 mg Per Tube QHS      LOS: 37 days   Reyes IVAR Moores, MD [facility] Office  [phone] Pager - Text Page per Tracey  If 7PM-7AM, please contact night-coverage per Amion 08/10/2024, 10:38 AM

## 2024-08-10 NOTE — Progress Notes (Addendum)
 Nutrition Brief Note  Secure chat received from surgery requesting modification to patient's tube feeding regimen and calorie and protein provision. Of note, new regimen recommended by surgery will increase calorie and protein intake compared to current regimen.  Estimated Nutritional Needs:  Kcal:  2000-2200 Protein:  100-115g Fluid:  1.9-2.2L/day  Current regimen meeting approximately 80% of estimated calorie needs as she is consuming some PO diet.   Discussed with surgery, who is clarifies they would prefer the increased provision in line with rate increase. Will remove one Prosource offering per day to better meet estimated protein needs. New regimen to meet 100% of estimated calorie and protein needs.   INTERVENTION:  Continue diet advancement as recommended by Surgery Currently on FLD Creon  per MD - 1 cap creon  TID with full liquids  *Pt refuses all ONS    Modify TF via J-tube x 16 hours: Osmolite 1.5 @ 82 ml/hr (per surgery) from 1600-0800 (1312 ml daily) May initiate at goal rate of 48ml/hr 60ml ProSource TF20 daily   Provides 2048 kcal, 102 gm protein, and 1000 ml free water  daily Continue Banatrol and Imodium    Free water  flushes to 100 ml q8h per MD (300 ml daily) Total free water  from TF + FWF: per day   NUTRITION DIAGNOSIS:  Inadequate oral intake related to nausea, vomiting (GOO r/t duodenal mass) as evidenced by per patient/family report. - Ongoing    GOAL:  Patient will meet greater than or equal to 90% of their needs - meeting via TF  Lauren Deaner MS, RD, LDN Registered Dietitian Clinical Nutrition RD Inpatient Contact Info in Amion

## 2024-08-10 NOTE — Plan of Care (Signed)

## 2024-08-10 NOTE — Progress Notes (Signed)
 Progress Note  19 Days Post-Op  Subjective: Patient reports that she is having bowel movements and passing flatus. Denies pain. Tolerating FLD. Denies nausea and vomiting.   ROS  All negative with the exception of above.  Objective: Vital signs in last 24 hours: Temp:  [97.9 F (36.6 C)-98.3 F (36.8 C)] 98.2 F (36.8 C) (09/28 0750) Pulse Rate:  [82-87] 87 (09/28 0750) Resp:  [16-28] 16 (09/28 0750) BP: (147-169)/(55-60) 169/59 (09/28 0750) SpO2:  [94 %-100 %] 97 % (09/28 0750) Weight:  [878 kg] 121 kg (09/28 0500) Last BM Date : 08/09/24  Intake/Output from previous day: 09/27 0701 - 09/28 0700 In: 1060 [P.O.:460; NG/GT:600] Out: 5070 [Urine:4600; Drains:470] Intake/Output this shift: Total I/O In: 100 [NG/GT:100] Out: -   PE: General: Pleasant female who is laying in bed in NAD. HEENT: Head is normocephalic, atraumatic.  Heart: HR normal Lungs: Respiratory effort nonlabored Abd: Soft, NT, mild distention. Midline incision present with steristrips. Lower portion of midline wound noted to have some drainage. No active bleeding, erythema of surrounding skin, or significant amount of purulence. Drain#1 with 25 cc of clear/white fluid. Drain #2 with minimal white/clear drainage. No guarding. Psych: A&Ox3 with an appropriate affect.    Lab Results:  Recent Labs    08/09/24 0430  WBC 7.4  HGB 8.9*  HCT 28.7*  PLT 496*   BMET Recent Labs    08/09/24 0430  NA 134*  K 4.1  CL 107  CO2 20*  GLUCOSE 220*  BUN 10  CREATININE 0.88  CALCIUM  7.6*   PT/INR No results for input(s): LABPROT, INR in the last 72 hours. CMP     Component Value Date/Time   NA 134 (L) 08/09/2024 0430   NA 142 06/18/2024 1445   K 4.1 08/09/2024 0430   CL 107 08/09/2024 0430   CO2 20 (L) 08/09/2024 0430   GLUCOSE 220 (H) 08/09/2024 0430   BUN 10 08/09/2024 0430   BUN 14 06/18/2024 1445   CREATININE 0.88 08/09/2024 0430   CREATININE 1.12 (H) 12/27/2021 0846   CALCIUM  7.6  (L) 08/09/2024 0430   PROT 5.8 (L) 08/06/2024 0600   PROT 7.2 06/18/2024 1445   ALBUMIN  <1.5 (L) 08/06/2024 0600   ALBUMIN  4.3 06/18/2024 1445   AST 17 08/06/2024 0600   ALT 29 08/06/2024 0600   ALKPHOS 98 08/06/2024 0600   BILITOT 0.8 08/06/2024 0600   BILITOT 0.4 06/18/2024 1445   GFRNONAA >60 08/09/2024 0430   Lipase     Component Value Date/Time   LIPASE 34 07/06/2024 0441       Studies/Results: No results found.  Anti-infectives: Anti-infectives (From admission, onward)    Start     Dose/Rate Route Frequency Ordered Stop   08/07/24 0000  vancomycin  (VANCOCIN ) IVPB 1000 mg/200 mL premix  Status:  Discontinued        1,000 mg 200 mL/hr over 60 Minutes Intravenous  Once 08/06/24 1106 08/06/24 1346   08/06/24 2300  vancomycin  (VANCOCIN ) IVPB 1000 mg/200 mL premix        1,000 mg 200 mL/hr over 60 Minutes Intravenous Every 12 hours 08/06/24 1346 08/09/24 2321   08/06/24 1200  vancomycin  (VANCOCIN ) IVPB 1000 mg/200 mL premix  Status:  Discontinued        1,000 mg 200 mL/hr over 60 Minutes Intravenous  Once 08/06/24 1105 08/06/24 1106   08/02/24 1400  piperacillin -tazobactam (ZOSYN ) IVPB 3.375 g        3.375 g 12.5 mL/hr over 240  Minutes Intravenous Every 8 hours 08/01/24 2238 08/10/24 0219   08/01/24 2330  cefTRIAXone  (ROCEPHIN ) injection 1 g       Placed in And Linked Group   1 g Intramuscular  Once 08/01/24 2238 08/02/24 0015   08/01/24 1800  vancomycin  (VANCOCIN ) IVPB 1000 mg/200 mL premix  Status:  Discontinued        1,000 mg 200 mL/hr over 60 Minutes Intravenous Every 12 hours 08/01/24 1642 08/06/24 1346   08/01/24 1047  vancomycin  variable dose per unstable renal function (pharmacist dosing)  Status:  Discontinued         Does not apply See admin instructions 08/01/24 1047 08/01/24 1646   07/30/24 2300  vancomycin  (VANCOCIN ) IVPB 1000 mg/200 mL premix  Status:  Discontinued        1,000 mg 200 mL/hr over 60 Minutes Intravenous Every 12 hours 07/30/24 1029  08/01/24 1047   07/30/24 1400  piperacillin -tazobactam (ZOSYN ) IVPB 3.375 g  Status:  Discontinued        3.375 g 12.5 mL/hr over 240 Minutes Intravenous Every 8 hours 07/30/24 1225 08/01/24 2238   07/30/24 1115  fluconazole  (DIFLUCAN ) IVPB 400 mg        400 mg 100 mL/hr over 120 Minutes Intravenous Every 24 hours 07/30/24 1025 08/09/24 1221   07/30/24 1100  vancomycin  (VANCOREADY) IVPB 2000 mg/400 mL        2,000 mg 200 mL/hr over 120 Minutes Intravenous  Once 07/30/24 1010 07/30/24 1540   07/28/24 0945  piperacillin -tazobactam (ZOSYN ) IVPB 3.375 g  Status:  Discontinued        3.375 g 12.5 mL/hr over 240 Minutes Intravenous Every 8 hours 07/28/24 0853 07/30/24 1131   07/23/24 0000  ceFAZolin  (ANCEF ) IVPB 2g/100 mL premix        2 g 200 mL/hr over 30 Minutes Intravenous Every 8 hours 07/22/24 2314 07/23/24 0058   07/22/24 0900  ceFAZolin  (ANCEF ) IVPB 2g/100 mL premix        2 g 200 mL/hr over 30 Minutes Intravenous To [facility] Surgical 07/21/24 1938 07/22/24 1432        Assessment/Plan POD19: S/P Whipple procedure creation, jejunostomy by Dr. Aron 07/22/2024.  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  -Hypertensive but stable compared to trends. Afebrile. -Labs pending. -Continue pain regimen. -Continue imodium  and fiber. -Antibiotics discontinued 9/27. -Continue PT/OT. -Continue full liquid diet through the weekend. pharmacist and RD notified to change TF via j tube to 16 hours. Off until 4 pm today, 82 ml/hr 4 pm to 8 AM. - Maintain drains. Total output 450 mL from drain 1 and 20 ml of drain 2. - Will continue to follow.   FEN: FLD; TF VTE: Heparin  injection ID: vanc/zosyn /diflucan  discontinued 9/27   LOS: 37 days   I reviewed specialist notes, nursing notes, last 24 h vitals and pain scores, last 48 h intake and output, last 24 h labs and trends, and last 24 h imaging results.   Marjorie Carlyon Favre, [facility] Surgery 08/10/2024, 10:35 AM Please see Amion for pager number during day hours 7:00am-4:30pm

## 2024-08-11 DIAGNOSIS — K56609 Unspecified intestinal obstruction, unspecified as to partial versus complete obstruction: Secondary | ICD-10-CM | POA: Diagnosis not present

## 2024-08-11 LAB — HEPATIC FUNCTION PANEL
ALT: 25 U/L (ref 0–44)
AST: 16 U/L (ref 15–41)
Albumin: 1.9 g/dL — ABNORMAL LOW (ref 3.5–5.0)
Alkaline Phosphatase: 100 U/L (ref 38–126)
Bilirubin, Direct: 0.2 mg/dL (ref 0.0–0.2)
Indirect Bilirubin: 0.4 mg/dL (ref 0.3–0.9)
Total Bilirubin: 0.6 mg/dL (ref 0.0–1.2)
Total Protein: 7.2 g/dL (ref 6.5–8.1)

## 2024-08-11 LAB — GLUCOSE, CAPILLARY
Glucose-Capillary: 301 mg/dL — ABNORMAL HIGH (ref 70–99)
Glucose-Capillary: 309 mg/dL — ABNORMAL HIGH (ref 70–99)
Glucose-Capillary: 211 mg/dL — ABNORMAL HIGH (ref 70–99)
Glucose-Capillary: 205 mg/dL — ABNORMAL HIGH (ref 70–99)
Glucose-Capillary: 231 mg/dL — ABNORMAL HIGH (ref 70–99)

## 2024-08-11 MED ORDER — HYDROCODONE-ACETAMINOPHEN 5-325 MG PO TABS: 1.0000 | ORAL_TABLET | Freq: Four times a day (QID) | ORAL | Status: DC | PRN

## 2024-08-11 MED ORDER — CLONIDINE HCL 0.1 MG PO TABS
0.1000 mg | ORAL_TABLET | Freq: Two times a day (BID) | ORAL | Status: DC
  Administered 2024-08-11: 0.1 mg via ORAL
  Filled 2024-08-11 (×2): qty 1

## 2024-08-11 MED ORDER — ALPRAZOLAM 0.5 MG PO TABS
1.0000 mg | ORAL_TABLET | Freq: Once | ORAL | Status: AC
  Administered 2024-08-11: 1 mg via ORAL
  Filled 2024-08-11: qty 2

## 2024-08-11 MED ORDER — INSULIN ASPART 100 UNIT/ML IJ SOLN
0.0000 [IU] | Freq: Three times a day (TID) | INTRAMUSCULAR | Status: DC
  Administered 2024-08-11 (×2): 5 [IU] via SUBCUTANEOUS
  Administered 2024-08-12 – 2024-08-13 (×3): 3 [IU] via SUBCUTANEOUS
  Administered 2024-08-13: 2 [IU] via SUBCUTANEOUS
  Administered 2024-08-13: 3 [IU] via SUBCUTANEOUS
  Administered 2024-08-14: 8 [IU] via SUBCUTANEOUS
  Administered 2024-08-14 (×2): 2 [IU] via SUBCUTANEOUS
  Administered 2024-08-15: 3 [IU] via SUBCUTANEOUS
  Administered 2024-08-15: 6 [IU] via SUBCUTANEOUS
  Administered 2024-08-15: 5 [IU] via SUBCUTANEOUS

## 2024-08-11 MED ORDER — INSULIN ASPART 100 UNIT/ML IJ SOLN
6.0000 [IU] | Freq: Three times a day (TID) | INTRAMUSCULAR | Status: DC
  Administered 2024-08-11 – 2024-08-15 (×13): 6 [IU] via SUBCUTANEOUS

## 2024-08-11 MED ORDER — INSULIN GLARGINE 100 UNIT/ML ~~LOC~~ SOLN
10.0000 [IU] | Freq: Two times a day (BID) | SUBCUTANEOUS | Status: DC
  Administered 2024-08-11 – 2024-08-15 (×8): 10 [IU] via SUBCUTANEOUS
  Filled 2024-08-11 (×9): qty 0.1

## 2024-08-11 MED ORDER — OSMOLITE 1.5 CAL PO LIQD
1312.0000 mL | ORAL | Status: DC
  Administered 2024-08-11 – 2024-08-12 (×2): 1312 mL

## 2024-08-11 MED ORDER — CARVEDILOL 6.25 MG PO TABS
6.2500 mg | ORAL_TABLET | Freq: Two times a day (BID) | ORAL | Status: DC
  Administered 2024-08-12: 6.25 mg
  Filled 2024-08-11: qty 1

## 2024-08-11 MED ORDER — CLONIDINE HCL 0.1 MG PO TABS
0.1000 mg | ORAL_TABLET | Freq: Two times a day (BID) | ORAL | Status: DC
  Administered 2024-08-11 – 2024-08-12 (×2): 0.1 mg
  Filled 2024-08-11: qty 1

## 2024-08-11 MED ORDER — METOCLOPRAMIDE HCL 5 MG/ML IJ SOLN
5.0000 mg | Freq: Three times a day (TID) | INTRAMUSCULAR | Status: AC
  Administered 2024-08-11 – 2024-08-12 (×6): 5 mg via INTRAVENOUS
  Filled 2024-08-11 (×6): qty 2

## 2024-08-11 MED ORDER — INSULIN ASPART 100 UNIT/ML IJ SOLN
0.0000 [IU] | Freq: Every day | INTRAMUSCULAR | Status: DC
  Administered 2024-08-11 – 2024-08-12 (×2): 2 [IU] via SUBCUTANEOUS

## 2024-08-11 MED ORDER — CARVEDILOL 6.25 MG PO TABS
6.2500 mg | ORAL_TABLET | Freq: Two times a day (BID) | ORAL | Status: DC
  Administered 2024-08-11: 6.25 mg via ORAL
  Filled 2024-08-11: qty 1

## 2024-08-11 MED ORDER — HYDRALAZINE HCL 20 MG/ML IJ SOLN
5.0000 mg | Freq: Once | INTRAMUSCULAR | Status: AC
  Administered 2024-08-11: 5 mg via INTRAVENOUS
  Filled 2024-08-11: qty 1

## 2024-08-11 MED ORDER — AMLODIPINE BESYLATE 10 MG PO TABS
10.0000 mg | ORAL_TABLET | Freq: Every day | ORAL | Status: DC
  Administered 2024-08-12: 10 mg
  Filled 2024-08-11: qty 1

## 2024-08-11 MED ORDER — TRAMADOL HCL 50 MG PO TABS
50.0000 mg | ORAL_TABLET | Freq: Two times a day (BID) | ORAL | Status: DC | PRN
Start: 2024-08-11 — End: 2024-08-15

## 2024-08-11 NOTE — Progress Notes (Signed)
   08/11/24 0813  Vitals  Temp 98.3 F (36.8 C)  Temp Source Oral  BP (!) 163/63  BP Location Left Arm  BP Method Automatic  Patient Position (if appropriate) Lying  Pulse Rate 100  Pulse Rate Source Dinamap  Resp 17  Level of Consciousness  Level of Consciousness Alert  MEWS COLOR  MEWS Score Color Green  Oxygen Therapy  SpO2 94 %  O2 Device Room Air  Pain Assessment  Pain Scale 0-10  Pain Score 0  MEWS Score  MEWS Temp 0  MEWS Systolic 0  MEWS Pulse 0  MEWS RR 0  MEWS LOC 0  MEWS Score 0

## 2024-08-11 NOTE — Progress Notes (Signed)
 OT Cancellation Note  Patient Details Name: Lauren Gray MRN: [id number] DOB: Mar 27, 1951   Cancelled Treatment:    Reason Eval/Treat Not Completed: Fatigue/lethargy limiting ability to participate;Pain limiting ability to participate. Pt reports she just got back comfortable after a PT session, asking to come back tomorrow.   [name], OTR/L [facility] Acute Rehabilitation Office: [phone]   [name] 08/11/2024, 4:44 PM

## 2024-08-11 NOTE — Progress Notes (Signed)
 Lauren Gray  FMW:[id number] DOB: [birth year] DOA: 07/04/2024 PCP: Juanice Thomes SAUNDERS, FNP    Brief Narrative:  73 year old with a history of HLD, pre-DM, and CKD stage III who presented to the hospital 07/04/2024 with a 1 month history of persistent vomiting with inability to tolerate anything other than a small volume of yogurt or crackers.  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  Following admission and an extensive evaluation she underwent a Whipple procedure 07/22/2024.  Goals of Care:   Code Status: Full Code   DVT prophylaxis: heparin  injection 5,000 Units Start: 08/05/24 0600 SCD's Start: 07/22/24 2315   Interim Hx: Suffered a bout of emesis this morning.  Afebrile.  Blood pressure remains elevated at 163-180.  CBG elevated at 177-309.  Feeling very weak in general at time of my visit and discouraged.  No chest pain or shortness of breath.  Assessment & Plan:  Duodenal adenocarcinoma with gastric outlet obstruction CT scan showed dilated 1st and 2nd portion of duodenum with possible underlying mass - 8/24 underwent small bowel enteroscopy - duodenal mass biopsy was positive for adenocarcinoma - due to poor nutritional status patient was started on TPN - Whipple procedure performed 07/22/2024 - CT abdomen and chest on 9/17 noted no new changes -ongoing care per General Surgery - very slowly advancing diet as able   Bilateral pleural effusion - Multifocal pneumonia - resolved  CT chest noted evidence of bilateral pleural effusion likely third spacing in the setting of hypoalbuminemia - underwent thoracentesis yielding 250 mL fluid > exudative based on LDH - cytology negative for malignant cells - has completed abx course for potential PNA - oxygen saturation is 97% on room air   Normocytic anemia / postop blood loss anemia with a combination of dilution from IV fluids Has received a total of 5 units of PRBC during this admission - Hgb  stable/improving presently without evidence of active bleeding  Recent Labs  Lab 08/05/24 0412 08/06/24 0600 08/07/24 0213 08/09/24 0430 08/10/24 1321  HGB 8.7* 8.8* 9.4* 8.9* 10.4*    Essential hypertension Blood pressure remains above goal -likely being driven somewhat by discomfort and anxiety/nausea - adjust medical therapy again today  Prediabetes with hyperglycemia A1c 6.1 - CBG variable with increasing oral intake and continued tube feeds -avoid hypoglycemia -with CBG climbing I have made significant changes in her insulin  regimen today    AKI on CKD 2 Baseline creatinine normal - admission serum creatinine was 1.35 - creatinine has normalized with support   Anxiety and insomnia Trazodone  at nighttime and Valium  as needed during the day  Obesity Class 3 - Body mass index is 40.59 kg/m.    Family Communication: Spoke with husband at bedside Disposition:   Acute Inpatient Rehab (3hours/Day)08/07/2024 1317  Objective: Blood pressure (!) 163/63, pulse 100, temperature 98.3 F (36.8 C), temperature source Oral, resp. rate 17, height 5' 8 (1.727 m), weight 121.1 kg, SpO2 94%.  Intake/Output Summary (Last 24 hours) at 08/11/2024 1034 Last data filed at 08/11/2024 0813 Gross per 24 hour  Intake 13127.33 ml  Output 560 ml  Net 12567.33 ml   Filed Weights   08/06/24 0418 08/10/24 0500 08/11/24 0500  Weight: 121 kg 121 kg 121.1 kg    Examination: General: No acute respiratory distress Lungs: Clear to auscultation bilaterally  Cardiovascular: Regular rate and rhythm without murmur  Abdomen: Nontender, nondistended, soft Extremities: Trace bilateral lower extremity edema without change  CBC:  Recent Labs  Lab 08/05/24 0412 08/06/24 0600 08/07/24 0213 08/09/24 0430 08/10/24 1321  WBC 10.7* 8.9 8.8 7.4 9.4  NEUTROABS 8.3* 6.6  --   --   --   HGB 8.7* 8.8* 9.4* 8.9* 10.4*  HCT 28.0* 27.3* 29.8* 28.7* 33.6*  MCV 93.3 91.9 92.8 93.2 92.8  PLT 507* 520* 542* 496*  611*   Basic Metabolic Panel: Recent Labs  Lab 08/05/24 0412 08/06/24 0600 08/07/24 0213 08/09/24 0430 08/10/24 1321  NA 139 137 134* 134* 136  K 4.0 3.9 3.8 4.1 4.4  CL 109 108 105 107 106  CO2 23 23 19* 20* 19*  GLUCOSE 179* 130* 181* 220* 249*  BUN 14 11 10 10 13   CREATININE 1.19* 0.98 1.01* 0.88 0.97  CALCIUM  7.3* 7.3* 7.5* 7.6* 8.0*  MG 1.9 1.7 1.8  --   --   PHOS  --   --  2.2*  --   --    GFR: Estimated Creatinine Clearance: 70.8 mL/min (by C-G formula based on SCr of 0.97 mg/dL).   Scheduled Meds:  amLODipine   10 mg Oral Daily   carvedilol  3.125 mg Oral BID WC   Chlorhexidine  Gluconate Cloth  6 each Topical Q0600   feeding supplement (OSMOLITE 1.5 CAL)  1,312 mL Per Tube Q24H   feeding supplement (PROSource TF20)  60 mL Per Tube Daily   fiber supplement (BANATROL TF)  60 mL Per Tube BID   fluticasone   2 spray Each Nare Daily   free water   100 mL Per Tube Q8H   gabapentin   100 mg Per Tube Q8H   heparin  injection (subcutaneous)  5,000 Units Subcutaneous Q8H   insulin  aspart  0-5 Units Subcutaneous QHS   insulin  aspart  0-9 Units Subcutaneous TID WC   insulin  aspart  3 Units Subcutaneous TID WC   insulin  glargine  10 Units Subcutaneous Daily   lipase/protease/amylase  24,000 Units Oral TID AC   loperamide   2 mg Oral TID   methocarbamol  (ROBAXIN ) injection  500 mg Intravenous Q8H   metoCLOPramide (REGLAN) injection  5 mg Intravenous Q8H   pantoprazole   40 mg Oral BID   traZODone   50 mg Per Tube QHS      LOS: 38 days   Reyes IVAR Moores, MD [facility] Office  [phone] Pager - Text Page per Tracey  If 7PM-7AM, please contact night-coverage per Amion 08/11/2024, 10:34 AM

## 2024-08-11 NOTE — Progress Notes (Signed)
 Progress Note  20 Days Post-Op  Subjective: Had one time n/v this weekend. Cafeteria wouldn't give any solids.   ROS  All negative with the exception of above.  Objective: Vital signs in last 24 hours: Temp:  [98.3 F (36.8 C)-98.5 F (36.9 C)] 98.3 F (36.8 C) (09/29 0813) Pulse Rate:  [95-100] 100 (09/29 0817) Resp:  [16-18] 17 (09/29 0813) BP: (163-180)/(59-72) 163/63 (09/29 0817) SpO2:  [94 %-100 %] 94 % (09/29 0813) Weight:  [121.1 kg] 121.1 kg (09/29 0500) Last BM Date : 08/09/24  Intake/Output from previous day: 09/28 0701 - 09/29 0700 In: 13227.3 [P.O.:100; WH/HU:87616.7; IV Piggyback:46.2] Out: 1250 [Urine:1000; Emesis/NG output:150; Drains:100] Intake/Output this shift: Total I/O In: -  Out: 80 [Drains:80]  PE: General: Pleasant female who is laying in bed in NAD. HEENT: Head is normocephalic, atraumatic.  Heart: HR normal Lungs: Respiratory effort nonlabored Abd: Soft, NT, mild distention. Midline incision present with steristrips. Lower portion of midline wound noted to have some drainage. No active bleeding, erythema of surrounding skin, or significant amount of purulence. Drain#1 with 25 cc of clear/white fluid. Drain #2 with minimal white/clear drainage. No guarding. Psych: A&Ox3 with an appropriate affect.    Lab Results:  Recent Labs    08/09/24 0430 08/10/24 1321  WBC 7.4 9.4  HGB 8.9* 10.4*  HCT 28.7* 33.6*  PLT 496* 611*   BMET Recent Labs    08/09/24 0430 08/10/24 1321  NA 134* 136  K 4.1 4.4  CL 107 106  CO2 20* 19*  GLUCOSE 220* 249*  BUN 10 13  CREATININE 0.88 0.97  CALCIUM  7.6* 8.0*   PT/INR No results for input(s): LABPROT, INR in the last 72 hours. CMP     Component Value Date/Time   NA 136 08/10/2024 1321   NA 142 06/18/2024 1445   K 4.4 08/10/2024 1321   CL 106 08/10/2024 1321   CO2 19 (L) 08/10/2024 1321   GLUCOSE 249 (H) 08/10/2024 1321   BUN 13 08/10/2024 1321   BUN 14 06/18/2024 1445   CREATININE  0.97 08/10/2024 1321   CREATININE 1.12 (H) 12/27/2021 0846   CALCIUM  8.0 (L) 08/10/2024 1321   PROT PENDING 08/10/2024 1321   PROT 7.2 06/18/2024 1445   ALBUMIN  1.9 (L) 08/10/2024 1321   ALBUMIN  4.3 06/18/2024 1445   AST 16 08/10/2024 1321   ALT 25 08/10/2024 1321   ALKPHOS 100 08/10/2024 1321   BILITOT 0.6 08/10/2024 1321   BILITOT 0.4 06/18/2024 1445   GFRNONAA >60 08/10/2024 1321   Lipase     Component Value Date/Time   LIPASE 34 07/06/2024 0441       Studies/Results: No results found.  Anti-infectives: Anti-infectives (From admission, onward)    Start     Dose/Rate Route Frequency Ordered Stop   08/07/24 0000  vancomycin  (VANCOCIN ) IVPB 1000 mg/200 mL premix  Status:  Discontinued        1,000 mg 200 mL/hr over 60 Minutes Intravenous  Once 08/06/24 1106 08/06/24 1346   08/06/24 2300  vancomycin  (VANCOCIN ) IVPB 1000 mg/200 mL premix        1,000 mg 200 mL/hr over 60 Minutes Intravenous Every 12 hours 08/06/24 1346 08/09/24 2321   08/06/24 1200  vancomycin  (VANCOCIN ) IVPB 1000 mg/200 mL premix  Status:  Discontinued        1,000 mg 200 mL/hr over 60 Minutes Intravenous  Once 08/06/24 1105 08/06/24 1106   08/02/24 1400  piperacillin -tazobactam (ZOSYN ) IVPB 3.375 g  3.375 g 12.5 mL/hr over 240 Minutes Intravenous Every 8 hours 08/01/24 2238 08/10/24 0219   08/01/24 2330  cefTRIAXone  (ROCEPHIN ) injection 1 g       Placed in And Linked Group   1 g Intramuscular  Once 08/01/24 2238 08/02/24 0015   08/01/24 1800  vancomycin  (VANCOCIN ) IVPB 1000 mg/200 mL premix  Status:  Discontinued        1,000 mg 200 mL/hr over 60 Minutes Intravenous Every 12 hours 08/01/24 1642 08/06/24 1346   08/01/24 1047  vancomycin  variable dose per unstable renal function (pharmacist dosing)  Status:  Discontinued         Does not apply See admin instructions 08/01/24 1047 08/01/24 1646   07/30/24 2300  vancomycin  (VANCOCIN ) IVPB 1000 mg/200 mL premix  Status:  Discontinued        1,000  mg 200 mL/hr over 60 Minutes Intravenous Every 12 hours 07/30/24 1029 08/01/24 1047   07/30/24 1400  piperacillin -tazobactam (ZOSYN ) IVPB 3.375 g  Status:  Discontinued        3.375 g 12.5 mL/hr over 240 Minutes Intravenous Every 8 hours 07/30/24 1225 08/01/24 2238   07/30/24 1115  fluconazole  (DIFLUCAN ) IVPB 400 mg        400 mg 100 mL/hr over 120 Minutes Intravenous Every 24 hours 07/30/24 1025 08/09/24 1221   07/30/24 1100  vancomycin  (VANCOREADY) IVPB 2000 mg/400 mL        2,000 mg 200 mL/hr over 120 Minutes Intravenous  Once 07/30/24 1010 07/30/24 1540   07/28/24 0945  piperacillin -tazobactam (ZOSYN ) IVPB 3.375 g  Status:  Discontinued        3.375 g 12.5 mL/hr over 240 Minutes Intravenous Every 8 hours 07/28/24 0853 07/30/24 1131   07/23/24 0000  ceFAZolin  (ANCEF ) IVPB 2g/100 mL premix        2 g 200 mL/hr over 30 Minutes Intravenous Every 8 hours 07/22/24 2314 07/23/24 0058   07/22/24 0900  ceFAZolin  (ANCEF ) IVPB 2g/100 mL premix        2 g 200 mL/hr over 30 Minutes Intravenous To [facility] 07/21/24 1938 07/22/24 1432        Assessment/Plan POD20: S/P Whipple procedure creation, jejunostomy by Dr. Aron 07/22/2024.  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    -Hypertensive but stable compared to trends. Afebrile. -Labs pending. -Continue pain regimen. -Continue imodium  and fiber. -Antibiotics discontinued 9/27. -Continue PT/OT. -Continue full liquid diet through the weekend. pharmacist and RD notified to change TF via j tube to 16 hours. Off until 4 pm today, 82 ml/hr 4 pm to 8 AM. Was hoping to advance diet, but will hold given emesis.  - Maintain drains. Total output 450 mL from drain 1 and 20 ml of drain 2. - Will continue to follow.   FEN: FLD. TF hold given emesis.  Add 2 days reglan.  Can't leave on as long term medication due to Qtc prolongation. VTE: Heparin  injection ID: vanc/zosyn /diflucan   discontinued 9/27   LOS: 38 days   I reviewed specialist notes, nursing notes, last 24 h vitals and pain scores, last 48 h intake and output, last 24 h labs and trends, and last 24 h imaging results.  Jina LITTIE Aron, MD, FACS, FSSO Surgical Oncology, General Surgery, Trauma and Critical [facility], [place] [phone] for weekday/non holidays Check [email or web address] for coverage night/weekend/holidays

## 2024-08-11 NOTE — Progress Notes (Signed)
 0110 Patient c/0 chest tightness and says she might be having panic attackI, [place], RN, have reviewed all documentation for this visit. The documentation on 08/11/24 for the exam, diagnosis, procedures, and orders are all accurate and complete. Ordered Anti  anxiety med p.o but she vomitted with Green color secretions.Felt relieved after vomitting.  0300   BP 180/72. MD made aware. New orders made.

## 2024-08-11 NOTE — Progress Notes (Signed)
 Physical Therapy Treatment Patient Details Name: Lauren Gray MRN: [id number] DOB: November 11, 1951 Today's Date: 08/11/2024   History of Present Illness Pt is a 73 y/o female presenting with 1 month of nausea/vomiting. CT abdomen showed SBO from underlying mass. Mass biopsy showed + invasive adenocarcinoma. Requiring TPN. S/p pancreaticoduodenectomy, pancreatic duct stent placement, J-tube placement 9/9. PMH: hypokalemia, HTN, CKD, prediabetes    PT Comments  Pt up in chair on arrival, agreeable to session with slow progress towards acute goals as pt limited by fatigue and bowel incontinence. Pt demonstrating transfers and short in room gait with grossly min A with RW for support with cues throughout for optimal hand placement and safety with mobility. Pt requiring increased assist to being Les into bed at end of session and reposition. Educated pt on importance of frequent short bouts of mobility throughout day to increase activity tolerance with pt verbalizing understanding. Pt continues to benefit from skilled PT services to progress toward functional mobility goals.     If plan is discharge home, recommend the following: A lot of help with walking and/or transfers;Assist for transportation;Help with stairs or ramp for entrance;Assistance with cooking/housework   Can travel by private vehicle        Equipment Recommendations  Rolling walker (2 wheels)    Recommendations for Other Services       Precautions / Restrictions Precautions Precautions: Fall;Other (comment) Precaution/Restrictions Comments: JP drainx2 (L), PEG tube Restrictions Weight Bearing Restrictions Per Provider Order: No     Mobility  Bed Mobility Overal bed mobility: Needs Assistance Bed Mobility: Rolling, Sit to Sidelying Rolling: Contact guard assist       Sit to sidelying: Min assist, Used rails General bed mobility comments: assist to bring LE back into bed    Transfers Overall transfer level: Needs  assistance Equipment used: Rolling walker (2 wheels) Transfers: Sit to/from Stand, Bed to chair/wheelchair/BSC Sit to Stand: Min assist           General transfer comment: min A to boost to stand from recliner and low commode, cues for hand placement    Ambulation/Gait Ambulation/Gait assistance: Contact guard assist Gait Distance (Feet): 18 Feet (x2) Assistive device: Rolling walker (2 wheels) Gait Pattern/deviations: Step-through pattern, Decreased stride length, Trunk flexed Gait velocity: Decreased     General Gait Details: cues for body proximity to AD heavily, intermittently trying to rest forearms on RW, short step through gait pattern with minimal heel toe gait.   Stairs             Wheelchair Mobility     Tilt Bed    Modified Rankin (Stroke Patients Only)       Balance Overall balance assessment: Needs assistance Sitting-balance support: Feet supported, Single extremity supported Sitting balance-Leahy Scale: Fair Sitting balance - Comments: Able to support self with UE sitting EOB   Standing balance support: During functional activity, Bilateral upper extremity supported, Reliant on assistive device for balance Standing balance-Leahy Scale: Poor Standing balance comment: Reliant on RW                            Communication Communication Communication: No apparent difficulties  Cognition Arousal: Alert Behavior During Therapy: Anxious   PT - Cognitive impairments: No apparent impairments                       PT - Cognition Comments: Cues for sequencing. Following commands: Intact  Cueing Cueing Techniques: Verbal cues  Exercises      General Comments General comments (skin integrity, edema, etc.): pt spouse and daughter present and supportice      Pertinent Vitals/Pain Pain Assessment Pain Assessment: Faces Faces Pain Scale: Hurts a little bit Pain Location: Abdomen with activity Pain Descriptors /  Indicators: Grimacing, Aching Pain Intervention(s): Monitored during session, Limited activity within patient's tolerance    Home Living                          Prior Function            PT Goals (current goals can now be found in the care plan section) Acute Rehab PT Goals Patient Stated Goal: Return to independence PT Goal Formulation: With patient Time For Goal Achievement: 08/21/24 Progress towards PT goals: Progressing toward goals    Frequency    Min 3X/week      PT Plan      Co-evaluation              AM-PAC PT 6 Clicks Mobility   Outcome Measure  Help needed turning from your back to your side while in a flat bed without using bedrails?: A Little Help needed moving from lying on your back to sitting on the side of a flat bed without using bedrails?: A Little Help needed moving to and from a bed to a chair (including a wheelchair)?: A Little Help needed standing up from a chair using your arms (e.g., wheelchair or bedside chair)?: A Little Help needed to walk in hospital room?: A Lot Help needed climbing 3-5 steps with a railing? : Total 6 Click Score: 15    End of Session   Activity Tolerance: Patient tolerated treatment well Patient left: with call bell/phone within reach;with family/visitor present;in bed Nurse Communication: Mobility status PT Visit Diagnosis: Other abnormalities of gait and mobility (R26.89);Pain;Muscle weakness (generalized) (M62.81) Pain - part of body:  (abdomen)     Time: 8473-8448 PT Time Calculation (min) (ACUTE ONLY): 25 min  Charges:    $Gait Training: 8-22 mins $Therapeutic Activity: 8-22 mins PT General Charges $$ ACUTE PT VISIT: 1 Visit                     Libra Gatz R. PTA Acute Rehabilitation Services Office: [phone]   Therisa CHRISTELLA Boor 08/11/2024, 4:16 PM

## 2024-08-11 NOTE — Progress Notes (Signed)
 Pt assisted back to bed from chair with walker and one assist. Pt in bed comfortably. Family at bedside. All needs me at this time

## 2024-08-11 NOTE — Progress Notes (Signed)
 Inpatient Rehab Admissions Coordinator:   Following for my colleague for increased participation.  Noted no therapy opportunities over the weekend and declined OT on Friday.    Reche Lowers, PT, DPT Admissions Coordinator [phone] 08/11/24  11:48 AM

## 2024-08-12 DIAGNOSIS — K56609 Unspecified intestinal obstruction, unspecified as to partial versus complete obstruction: Secondary | ICD-10-CM | POA: Diagnosis not present

## 2024-08-12 LAB — PHOSPHORUS: Phosphorus: 2.4 mg/dL — ABNORMAL LOW (ref 2.5–4.6)

## 2024-08-12 LAB — COMPREHENSIVE METABOLIC PANEL WITH GFR
ALT: 19 U/L (ref 0–44)
AST: 13 U/L — ABNORMAL LOW (ref 15–41)
Albumin: 1.6 g/dL — ABNORMAL LOW (ref 3.5–5.0)
Alkaline Phosphatase: 83 U/L (ref 38–126)
Anion gap: 9 (ref 5–15)
BUN: 18 mg/dL (ref 8–23)
CO2: 20 mmol/L — ABNORMAL LOW (ref 22–32)
Calcium: 7.9 mg/dL — ABNORMAL LOW (ref 8.9–10.3)
Chloride: 110 mmol/L (ref 98–111)
Creatinine, Ser: 0.95 mg/dL (ref 0.44–1.00)
GFR, Estimated: >60 mL/min (ref >60)
Glucose, Bld: 292 mg/dL — ABNORMAL HIGH (ref 70–99)
Potassium: 4 mmol/L (ref 3.5–5.1)
Sodium: 139 mmol/L (ref 135–145)
Total Bilirubin: 0.6 mg/dL (ref 0.0–1.2)
Total Protein: 6.3 g/dL — ABNORMAL LOW (ref 6.5–8.1)

## 2024-08-12 LAB — GLUCOSE, CAPILLARY
Glucose-Capillary: 189 mg/dL — ABNORMAL HIGH (ref 70–99)
Glucose-Capillary: 93 mg/dL (ref 70–99)
Glucose-Capillary: 160 mg/dL — ABNORMAL HIGH (ref 70–99)
Glucose-Capillary: 205 mg/dL — ABNORMAL HIGH (ref 70–99)

## 2024-08-12 LAB — MAGNESIUM: Magnesium: 1.8 mg/dL (ref 1.7–2.4)

## 2024-08-12 MED ORDER — OSMOLITE 1.5 CAL PO LIQD
1260.0000 mL | ORAL | Status: DC
  Administered 2024-08-13 – 2024-08-15 (×3): 1260 mL
  Filled 2024-08-12: qty 1422
  Filled 2024-08-12: qty 2000

## 2024-08-12 MED ORDER — CLONIDINE HCL 0.1 MG PO TABS
0.1000 mg | ORAL_TABLET | Freq: Two times a day (BID) | ORAL | Status: DC
  Administered 2024-08-12 – 2024-08-15 (×7): 0.1 mg via ORAL
  Filled 2024-08-12 (×7): qty 1

## 2024-08-12 MED ORDER — AMLODIPINE BESYLATE 10 MG PO TABS
10.0000 mg | ORAL_TABLET | Freq: Every day | ORAL | Status: DC
Start: 2024-08-13 — End: 2024-08-15
  Administered 2024-08-13 – 2024-08-15 (×3): 10 mg via ORAL
  Filled 2024-08-12 (×3): qty 1

## 2024-08-12 MED ORDER — CARVEDILOL 6.25 MG PO TABS
6.2500 mg | ORAL_TABLET | Freq: Two times a day (BID) | ORAL | Status: DC
  Administered 2024-08-12 – 2024-08-15 (×7): 6.25 mg via ORAL
  Filled 2024-08-12 (×7): qty 1

## 2024-08-12 NOTE — Progress Notes (Incomplete)
 Nutrition Follow-up  DOCUMENTATION CODES:   Obesity unspecified  INTERVENTION:  Continue diet advancement as recommended by Surgery Currently on Soft diet Creon  per MD - 1 cap creon  TID with full liquids  *Pt refuses all ONS    TF via J-tube x 18 hours from: Osmolite 1.5 @ 70 ml/hr  from 1400-0800 (1260 ml daily) 60ml ProSource TF20 daily   Provides 1970 kcal, 99 gm protein, and 960 ml free water  daily  Current regimen meeting approximately 98% of estimated calorie needs as she is consuming some PO diet.   Continue Banatrol and Imodium     Free water  flushes to 100 ml q8h per MD (300 ml daily) Total free water  from TF + FWF: 1260 ml per day  NUTRITION DIAGNOSIS:   Inadequate oral intake related to nausea, vomiting (GOO r/t duodenal mass) as evidenced by per patient/family report. - Ongoing   GOAL:   Patient will meet greater than or equal to 90% of their needs - Meeting with tube feeds  MONITOR:   Diet advancement, Labs, Weight trends, TF tolerance, Skin, I & O's, Other (Comment) (TPN)  REASON FOR ASSESSMENT:   Consult New TPN/TNA  ASSESSMENT:  73 y/o female with h/o CKD II, hiatal hernia, HLD, pre-diabetes, s/p hysterectomy and pelvic mass s/p laparoscopic bilateral salpingo-oophorectomy (2007) who is admitted with AKI and GOO secondary to duodenal mass (adenocarcinoma) now s/p classic pancreaticoduodenectomy, placement of pancreatic duct stent and placement of (18 [place]) feeding jejunostomy tube 9/9.   9/28 - Tube feeds changed to run for 16 hours meeting 100% of pt's nutritional needs 9/29 - TF held due to emesis  9/30 - TF changed back to run for 18 hours   Pt resting in bed with family at bedside. Pt tolerating tube feeds running for 18 hours. Did not seem to tolerate when tube feeds were changed to run for 16 hours as pt vomited x 1. On Reglan.   Pt seems down today, family at bedside seem frustrated at situation. They are upset that they cannot get certain  things provided by the hospital such as lemon juice or crystal light packets. Encouraged family to bring in these items for pt. Pt is a picky eater at baseline, suspect intake will be challenging in the future. Provided post whipple nutrition education handout. Briefly went over education however pt and family seemed uninterestered at this time.   Pt is not motivated to eat as she is scared to have loose BM afterwards. Did not have breakfast or dinner last night. No appetite. Adamantly refuses ONS. No nausea, vomiting, abdominal pain today. Diet advance to soft today.   No way to add pancreatic enzymes for continuous tube feeds. On PO creon  with meals.   Admit weight: 109.3 kg Current weight: 121 kg     Intake/Output Summary (Last 24 hours) at 08/13/2024 1441 Last data filed at 08/13/2024 0516 Gross per 24 hour  Intake 200 ml  Output 165 ml  Net 35 ml   Drains/Lines: R JP drain: 155 ml x 24 hours  R lateral JP drain: 10 ml x 24 hours   Average Meal Intake: 9/26: 60-70% average intake x 2 recorded meals 9/27: 33% average intake x 3 recorded meals   Nutritionally Relevant Medications: Scheduled Meds:  feeding supplement (OSMOLITE 1.5 CAL)  1,260 mL Per Tube Q24H   feeding supplement (PROSource TF20)  60 mL Per Tube Daily   fluticasone   2 spray Each Nare Daily   free water   100 mL Per  Tube Q8H   insulin  aspart  0-15 Units Subcutaneous TID WC   insulin  aspart  0-5 Units Subcutaneous QHS   insulin  aspart  6 Units Subcutaneous TID WC   insulin  glargine  10 Units Subcutaneous BID   lipase/protease/amylase  24,000 Units Oral TID AC   Continuous Infusions:  promethazine  (PHENERGAN ) injection (IM or IVPB) 12.5 mg (08/10/24 1334)    Labs Reviewed: Phosphorus 2.4 AST 13 CBG ranges from 93-205 mg/dL over the last 24 hours HgbA1c 6.1  Diet Order:   Diet Order             DIET SOFT Room service appropriate? Yes; Fluid consistency: Thin  Diet effective now                    EDUCATION NEEDS:   Education needs have been addressed  Skin:  Skin Assessment: Reviewed RN Assessment (incision abdomen, JP drain, VAC)  Last BM:  10/1  Height:   Ht Readings from Last 1 Encounters:  07/22/24 5' 8 (1.727 m)    Weight:   Wt Readings from Last 1 Encounters:  08/13/24 121.2 kg    Ideal Body Weight:  63.6 kg  BMI:  Body mass index is 40.63 kg/m.  Estimated Nutritional Needs:   Kcal:  2000-2200  Protein:  100-115g  Fluid:  1.9-2.2L/day   Olivia Kenning, RD Registered Dietitian  See Amion for more information

## 2024-08-12 NOTE — Progress Notes (Signed)
 Mobility Specialist Progress Note:   08/12/24 1116  Mobility  Activity Ambulated with assistance (To BR)  Level of Assistance Minimal assist, patient does 75% or more  Assistive Device Front wheel walker  Distance Ambulated (ft) 10 ft  Activity Response Tolerated well  Mobility Referral Yes  Mobility visit 1 Mobility  Mobility Specialist Start Time (ACUTE ONLY) 1010  Mobility Specialist Stop Time (ACUTE ONLY) 1020  Mobility Specialist Time Calculation (min) (ACUTE ONLY) 10 min   Received pt in bed and agreeable to mobility. Pt requested to use BR. Pt required MinA STS, otherwise contact guard during ambulation. Pt informed to pull call light once finished. All needs met. NT aware.  Lavanda Pollack Mobility Specialist  Please contact via [employer] or  Rehab Office [phone]

## 2024-08-12 NOTE — Progress Notes (Signed)
 Asked tech to not place purewick. Pt is continent and can get to [facility] if she needs to use the restroom

## 2024-08-12 NOTE — Progress Notes (Signed)
 Lauren Gray  FMW:[id number] DOB: 1951-09-18 DOA: 07/04/2024 PCP: Juanice Thomes SAUNDERS, FNP    Brief Narrative:  73 year old with a history of HLD, pre-DM, and CKD stage III who presented to the hospital 07/04/2024 with a 1 month history of persistent vomiting with inability to tolerate anything other than a small volume of yogurt or crackers.  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  Following admission and an extensive evaluation she underwent a Whipple procedure 07/22/2024.  Goals of Care:   Code Status: Full Code   DVT prophylaxis: heparin  injection 5,000 Units Start: 08/05/24 0600 SCD's Start: 07/22/24 2315   Interim Hx: No further nausea or vomiting reported.  No acute events reported overnight.  Afebrile.  Blood pressure remains elevated above goal at 151-169.  CBG presently 189-231.  In good spirits today.  Sitting up in a bedside chair with a big smile on her face.  Appears to be making good progress at this time.  Assessment & Plan:  Duodenal adenocarcinoma with gastric outlet obstruction CT scan showed dilated 1st and 2nd portion of duodenum with possible underlying mass - 8/24 underwent small bowel enteroscopy - duodenal mass biopsy was positive for adenocarcinoma - due to poor nutritional status patient was started on TPN - Whipple procedure performed 07/22/2024 - CT abdomen and chest on 9/17 noted no new changes -ongoing care per General Surgery - very slowly advancing diet as able   Bilateral pleural effusion - Multifocal pneumonia - resolved  CT chest noted evidence of bilateral pleural effusion likely third spacing in the setting of hypoalbuminemia - underwent thoracentesis yielding 250 mL fluid > exudative based on LDH - cytology negative for malignant cells - has completed abx course for potential PNA - oxygen saturation is 97% on room air   Normocytic anemia / postop blood loss anemia with a combination of dilution from IV  fluids Has received a total of 5 units of PRBC during this admission - Hgb stable/improving presently without evidence of further active bleeding  Essential hypertension Blood pressure remains above goal - likely being driven somewhat by discomfort and anxiety/nausea - follow-up without change today as blood pressure medications adjusted 9/29 and there is no acute indication for rapid correction  Prediabetes with hyperglycemia A1c 6.1 - CBG variable with increasing oral intake and continued tube feeds -avoid hypoglycemia -with CBG climbing I made significant changes in her insulin  regimen 9/29 -follow-up without further change for now   AKI on CKD 2 Baseline creatinine normal - admission serum creatinine was 1.35 - creatinine has normalized with support   Anxiety and insomnia Trazodone  at nighttime and Valium  as needed during the day  Obesity Class 3 - Body mass index is 40.59 kg/m.    Family Communication: No family present at time of visit today Disposition:   Acute Inpatient Rehab (3hours/Day)08/11/2024 1600  Objective: Blood pressure (!) 151/62, pulse 92, temperature 98 F (36.7 C), temperature source Oral, resp. rate 18, height 5' 8 (1.727 m), weight 121.1 kg, SpO2 94%.  Intake/Output Summary (Last 24 hours) at 08/12/2024 1158 Last data filed at 08/12/2024 0520 Gross per 24 hour  Intake 300 ml  Output 540 ml  Net -240 ml   Filed Weights   08/06/24 0418 08/10/24 0500 08/11/24 0500  Weight: 121 kg 121 kg 121.1 kg    Examination: General: No acute respiratory distress Lungs: Clear to auscultation bilaterally  Cardiovascular: Regular rate and rhythm without murmur  Abdomen:  NT/ND, soft Extremities: Trace bilateral lower extremity edema without change  CBC: Recent Labs  Lab 08/06/24 0600 08/07/24 0213 08/09/24 0430 08/10/24 1321  WBC 8.9 8.8 7.4 9.4  NEUTROABS 6.6  --   --   --   HGB 8.8* 9.4* 8.9* 10.4*  HCT 27.3* 29.8* 28.7* 33.6*  MCV 91.9 92.8 93.2 92.8   PLT 520* 542* 496* 611*   Basic Metabolic Panel: Recent Labs  Lab 08/06/24 0600 08/07/24 0213 08/09/24 0430 08/10/24 1321 08/12/24 0052  NA 137 134* 134* 136 139  K 3.9 3.8 4.1 4.4 4.0  CL 108 105 107 106 110  CO2 23 19* 20* 19* 20*  GLUCOSE 130* 181* 220* 249* 292*  BUN 11 10 10 13 18   CREATININE 0.98 1.01* 0.88 0.97 0.95  CALCIUM  7.3* 7.5* 7.6* 8.0* 7.9*  MG 1.7 1.8  --   --  1.8  PHOS  --  2.2*  --   --  2.4*   GFR: Estimated Creatinine Clearance: 72.3 mL/min (by C-G formula based on SCr of 0.95 mg/dL).   Scheduled Meds:  amLODipine   10 mg Per Tube Daily   carvedilol  6.25 mg Per Tube BID WC   Chlorhexidine  Gluconate Cloth  6 each Topical Q0600   cloNIDine   0.1 mg Per Tube BID   feeding supplement (OSMOLITE 1.5 CAL)  1,312 mL Per Tube Q24H   feeding supplement (PROSource TF20)  60 mL Per Tube Daily   fiber supplement (BANATROL TF)  60 mL Per Tube BID   fluticasone   2 spray Each Nare Daily   free water   100 mL Per Tube Q8H   gabapentin   100 mg Per Tube Q8H   heparin  injection (subcutaneous)  5,000 Units Subcutaneous Q8H   insulin  aspart  0-15 Units Subcutaneous TID WC   insulin  aspart  0-5 Units Subcutaneous QHS   insulin  aspart  6 Units Subcutaneous TID WC   insulin  glargine  10 Units Subcutaneous BID   lipase/protease/amylase  24,000 Units Oral TID AC   loperamide   2 mg Oral TID   methocarbamol  (ROBAXIN ) injection  500 mg Intravenous Q8H   metoCLOPramide (REGLAN) injection  5 mg Intravenous Q8H   pantoprazole   40 mg Oral BID   traZODone   50 mg Per Tube QHS      LOS: 39 days   Reyes IVAR Moores, MD [facility] Office  [phone] Pager - Text Page per Tracey  If 7PM-7AM, please contact night-coverage per Amion 08/12/2024, 11:58 AM

## 2024-08-12 NOTE — Progress Notes (Signed)
 Per Olivia SAUNDERS. Snader,RD changed her TF rate to 70 ml/hr x 18 hours 1400-0800.

## 2024-08-12 NOTE — Progress Notes (Signed)
 Occupational Therapy Treatment Patient Details Name: Lauren Gray MRN: [id number] DOB: 08/10/1951 Today's Date: 08/12/2024   History of present illness Pt is a 72 y/o female presenting with 1 month of nausea/vomiting. CT abdomen showed SBO from underlying mass. Mass biopsy showed + invasive adenocarcinoma. Requiring TPN. S/p pancreaticoduodenectomy, pancreatic duct stent placement, J-tube placement 9/9. PMH: hypokalemia, HTN, CKD, prediabetes   OT comments  Session focused on LB ADL and challenging activity tolerance. Engaged pt in LB ADL with education for compensatory techniques for comfort and pt needing up to min A due to decr mobility in RLE. Worked on functional mobility to then challenge activity tolerance; pt states she is always light headed when she gets up and fatigues quickly needing one standing rest break during ~30 ft bout of mobility with pt propping hip on sink for break. BP taken at end of session and 159/59 (83). Will continue to follow.       If plan is discharge home, recommend the following:  A lot of help with bathing/dressing/bathroom;Assistance with cooking/housework;Direct supervision/assist for medications management;Direct supervision/assist for financial management;Assist for transportation;Help with stairs or ramp for entrance;A lot of help with walking and/or transfers   Equipment Recommendations  BSC/3in1;Tub/shower seat    Recommendations for Other Services PT consult;Rehab consult    Precautions / Restrictions Precautions Precautions: Fall;Other (comment) Precaution/Restrictions Comments: JP drainx2 (L), PEG tube Restrictions Weight Bearing Restrictions Per Provider Order: No       Mobility Bed Mobility               General bed mobility comments: OOB in chair    Transfers Overall transfer level: Needs assistance Equipment used: Rolling walker (2 wheels) Transfers: Sit to/from Stand, Bed to chair/wheelchair/BSC Sit to Stand: Min assist            General transfer comment: min A to boost to standing with cues for hand placement     Balance Overall balance assessment: Needs assistance Sitting-balance support: Feet supported, Single extremity supported Sitting balance-Leahy Scale: Fair Sitting balance - Comments: Able to support self with UE sitting EOB   Standing balance support: During functional activity, Bilateral upper extremity supported, Reliant on assistive device for balance Standing balance-Leahy Scale: Poor Standing balance comment: Reliant on RW                           ADL either performed or assessed with clinical judgement   ADL Overall ADL's : Needs assistance/impaired     Grooming: Set up;Sitting               Lower Body Dressing: Minimal assistance Lower Body Dressing Details (indicate cue type and reason): for R sock Toilet Transfer: Minimal assistance;Ambulation;Rolling walker (2 wheels)           Functional mobility during ADLs: Contact guard assist;Minimal assistance;Rolling walker (2 wheels)      Extremity/Trunk Assessment Upper Extremity Assessment Upper Extremity Assessment: Generalized weakness   Lower Extremity Assessment Lower Extremity Assessment: Defer to PT evaluation        Vision   Vision Assessment?: No apparent visual deficits   Perception Perception Perception: Not tested   Praxis Praxis Praxis: Not tested   Communication Communication Communication: No apparent difficulties   Cognition Arousal: Alert Behavior During Therapy: Anxious Cognition: No apparent impairments  Following commands: Intact        Cueing   Cueing Techniques: Verbal cues  Exercises      Shoulder Instructions       General Comments spouse present    Pertinent Vitals/ Pain       Pain Assessment Pain Assessment: Faces Faces Pain Scale: Hurts little more Pain Location: Abdomen with activity Pain Descriptors /  Indicators: Grimacing, Aching Pain Intervention(s): Limited activity within patient's tolerance, Monitored during session  Home Living                                          Prior Functioning/Environment              Frequency  Min 2X/week        Progress Toward Goals  OT Goals(current goals can now be found in the care plan section)  Progress towards OT goals: Progressing toward goals  Acute Rehab OT Goals Patient Stated Goal: get rest OT Goal Formulation: With patient/family Time For Goal Achievement: 08/26/24 Potential to Achieve Goals: Good ADL Goals Pt Will Perform Grooming: with set-up;standing;sitting Pt Will Perform Lower Body Bathing: with modified independence;sitting/lateral leans;sit to/from stand Pt Will Perform Upper Body Dressing: with set-up;sitting;standing Pt Will Perform Lower Body Dressing: with set-up;sitting/lateral leans;sit to/from stand Pt Will Transfer to Toilet: with set-up;ambulating;regular height toilet Additional ADL Goal #1: Pt to verbalize at least 3 energy conservation strategies to implement at home  Plan      Co-evaluation                 AM-PAC OT 6 Clicks Daily Activity     Outcome Measure   Help from another person eating meals?: A Little Help from another person taking care of personal grooming?: A Little Help from another person toileting, which includes using toliet, bedpan, or urinal?: A Lot Help from another person bathing (including washing, rinsing, drying)?: A Lot Help from another person to put on and taking off regular upper body clothing?: A Little Help from another person to put on and taking off regular lower body clothing?: A Lot 6 Click Score: 15    End of Session Equipment Utilized During Treatment: Gait belt;Rolling walker (2 wheels)  OT Visit Diagnosis: Unsteadiness on feet (R26.81);Other abnormalities of gait and mobility (R26.89);Muscle weakness (generalized) (M62.81)    Activity Tolerance Patient limited by pain   Patient Left in chair;with call bell/phone within reach;with chair alarm set;with family/visitor present   Nurse Communication Mobility status        Time: 8960-8940 OT Time Calculation (min): 20 min  Charges: OT General Charges $OT Visit: 1 Visit OT Treatments $Self Care/Home Management : 8-22 mins  [name], OTR/L [facility] Acute Rehabilitation Office: [phone]   [name] 08/12/2024, 12:45 PM

## 2024-08-12 NOTE — Progress Notes (Signed)
 Pt stated that after lab draw from phlebotomy bruise appeared in area on left arm. It is purple, swollen, and tender to touch. Will keep eye on sight

## 2024-08-12 NOTE — Progress Notes (Signed)
 Progress Note  21 Days Post-Op  Subjective: Feeling a lot better today.  No more n/v.    ROS  All negative with the exception of above.  Objective: Vital signs in last 24 hours: Temp:  [98 F (36.7 C)-98.9 F (37.2 C)] 98 F (36.7 C) (09/30 0700) Pulse Rate:  [92-101] 92 (09/30 0840) Resp:  [16-20] 18 (09/30 0700) BP: (151-169)/(62-67) 151/62 (09/30 0840) SpO2:  [92 %-100 %] 94 % (09/30 0700) Last BM Date : 08/11/24  Intake/Output from previous day: 09/29 0701 - 09/30 0700 In: 300 [NG/GT:300] Out: 620 [Urine:250; Drains:370] Intake/Output this shift: No intake/output data recorded.  PE: General: Pleasant female who is laying in bed in NAD. HEENT: Head is normocephalic, atraumatic.  Heart: HR normal Lungs: Respiratory effort nonlabored Abd: soft, non distended, minimal drainage around drains.   Psych: A&Ox3 with an appropriate affect.    Lab Results:  Recent Labs    08/10/24 1321  WBC 9.4  HGB 10.4*  HCT 33.6*  PLT 611*   BMET Recent Labs    08/10/24 1321 08/12/24 0052  NA 136 139  K 4.4 4.0  CL 106 110  CO2 19* 20*  GLUCOSE 249* 292*  BUN 13 18  CREATININE 0.97 0.95  CALCIUM  8.0* 7.9*   PT/INR No results for input(s): LABPROT, INR in the last 72 hours. CMP     Component Value Date/Time   NA 139 08/12/2024 0052   NA 142 06/18/2024 1445   K 4.0 08/12/2024 0052   CL 110 08/12/2024 0052   CO2 20 (L) 08/12/2024 0052   GLUCOSE 292 (H) 08/12/2024 0052   BUN 18 08/12/2024 0052   BUN 14 06/18/2024 1445   CREATININE 0.95 08/12/2024 0052   CREATININE 1.12 (H) 12/27/2021 0846   CALCIUM  7.9 (L) 08/12/2024 0052   PROT 6.3 (L) 08/12/2024 0052   PROT 7.2 06/18/2024 1445   ALBUMIN  1.6 (L) 08/12/2024 0052   ALBUMIN  4.3 06/18/2024 1445   AST 13 (L) 08/12/2024 0052   ALT 19 08/12/2024 0052   ALKPHOS 83 08/12/2024 0052   BILITOT 0.6 08/12/2024 0052   BILITOT 0.4 06/18/2024 1445   GFRNONAA >60 08/12/2024 0052   Lipase     Component Value  Date/Time   LIPASE 34 07/06/2024 0441       Studies/Results: No results found.  Anti-infectives: Anti-infectives (From admission, onward)    Start     Dose/Rate Route Frequency Ordered Stop   08/07/24 0000  vancomycin  (VANCOCIN ) IVPB 1000 mg/200 mL premix  Status:  Discontinued        1,000 mg 200 mL/hr over 60 Minutes Intravenous  Once 08/06/24 1106 08/06/24 1346   08/06/24 2300  vancomycin  (VANCOCIN ) IVPB 1000 mg/200 mL premix        1,000 mg 200 mL/hr over 60 Minutes Intravenous Every 12 hours 08/06/24 1346 08/09/24 2321   08/06/24 1200  vancomycin  (VANCOCIN ) IVPB 1000 mg/200 mL premix  Status:  Discontinued        1,000 mg 200 mL/hr over 60 Minutes Intravenous  Once 08/06/24 1105 08/06/24 1106   08/02/24 1400  piperacillin -tazobactam (ZOSYN ) IVPB 3.375 g        3.375 g 12.5 mL/hr over 240 Minutes Intravenous Every 8 hours 08/01/24 2238 08/10/24 0219   08/01/24 2330  cefTRIAXone  (ROCEPHIN ) injection 1 g       Placed in And Linked Group   1 g Intramuscular  Once 08/01/24 2238 08/02/24 0015   08/01/24 1800  vancomycin  (VANCOCIN ) IVPB  1000 mg/200 mL premix  Status:  Discontinued        1,000 mg 200 mL/hr over 60 Minutes Intravenous Every 12 hours 08/01/24 1642 08/06/24 1346   08/01/24 1047  vancomycin  variable dose per unstable renal function (pharmacist dosing)  Status:  Discontinued         Does not apply See admin instructions 08/01/24 1047 08/01/24 1646   07/30/24 2300  vancomycin  (VANCOCIN ) IVPB 1000 mg/200 mL premix  Status:  Discontinued        1,000 mg 200 mL/hr over 60 Minutes Intravenous Every 12 hours 07/30/24 1029 08/01/24 1047   07/30/24 1400  piperacillin -tazobactam (ZOSYN ) IVPB 3.375 g  Status:  Discontinued        3.375 g 12.5 mL/hr over 240 Minutes Intravenous Every 8 hours 07/30/24 1225 08/01/24 2238   07/30/24 1115  fluconazole  (DIFLUCAN ) IVPB 400 mg        400 mg 100 mL/hr over 120 Minutes Intravenous Every 24 hours 07/30/24 1025 08/09/24 1221    07/30/24 1100  vancomycin  (VANCOREADY) IVPB 2000 mg/400 mL        2,000 mg 200 mL/hr over 120 Minutes Intravenous  Once 07/30/24 1010 07/30/24 1540   07/28/24 0945  piperacillin -tazobactam (ZOSYN ) IVPB 3.375 g  Status:  Discontinued        3.375 g 12.5 mL/hr over 240 Minutes Intravenous Every 8 hours 07/28/24 0853 07/30/24 1131   07/23/24 0000  ceFAZolin  (ANCEF ) IVPB 2g/100 mL premix        2 g 200 mL/hr over 30 Minutes Intravenous Every 8 hours 07/22/24 2314 07/23/24 0058   07/22/24 0900  ceFAZolin  (ANCEF ) IVPB 2g/100 mL premix        2 g 200 mL/hr over 30 Minutes Intravenous To [facility] 07/21/24 1938 07/22/24 1432        Assessment/Plan POD 21: S/P Whipple procedure creation, jejunostomy by Dr. Aron 07/22/2024.  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    -Hypertensive but stable compared to trends. Afebrile. -Continue pain regimen. -Continue imodium  and fiber. -Antibiotics discontinued 9/27. -Continue PT/OT. -Continue full liquid diet.  Switched back to 18 hours of feeds. Feeling better with that.  Will try soft solids tomorrow.   - Maintain drains.  - Will continue to follow.   FEN: FLD.  Day 2 reglan.  Can't leave on as long term medication due to Qtc prolongation. As long as no n/v will try soft solids tomorrow.   VTE: Heparin  injection ID: vanc/zosyn /diflucan  discontinued 9/27. WBCs ok, no fevers.     LOS: 39 days   I reviewed specialist notes, nursing notes, last 24 h vitals and pain scores, last 48 h intake and output, last 24 h labs and trends, and last 24 h imaging results.  Jina LITTIE Aron, MD, FACS, FSSO Surgical Oncology, General Surgery, Trauma and Critical [facility], [place] [phone] for weekday/non holidays Check [email or web address] for coverage night/weekend/holidays

## 2024-08-12 NOTE — Plan of Care (Signed)
  Problem: Activity: Goal: Risk for activity intolerance will decrease Outcome: Not Progressing   Problem: Nutrition: Goal: Adequate nutrition will be maintained Outcome: Not Progressing   Problem: Coping: Goal: Level of anxiety will decrease Outcome: Not Progressing

## 2024-08-13 DIAGNOSIS — K56609 Unspecified intestinal obstruction, unspecified as to partial versus complete obstruction: Secondary | ICD-10-CM | POA: Diagnosis not present

## 2024-08-13 LAB — GLUCOSE, CAPILLARY
Glucose-Capillary: 163 mg/dL — ABNORMAL HIGH (ref 70–99)
Glucose-Capillary: 154 mg/dL — ABNORMAL HIGH (ref 70–99)
Glucose-Capillary: 122 mg/dL — ABNORMAL HIGH (ref 70–99)
Glucose-Capillary: 187 mg/dL — ABNORMAL HIGH (ref 70–99)

## 2024-08-13 NOTE — Progress Notes (Signed)
 Patient rang out for assistance with bedpan. Nurse and tech both in other rooms. Patient was unable to hold her urine. Patient cleaned and bathed, also had CHG bath and abd dressings were changed.

## 2024-08-13 NOTE — Progress Notes (Signed)
 Progress Note  22 Days Post-Op  Subjective: No n/v. Having a small amount of drainage from lower portion of wound.    ROS  All negative with the exception of above.  Objective: Vital signs in last 24 hours: Temp:  [98.1 F (36.7 C)-98.4 F (36.9 C)] 98.2 F (36.8 C) (10/01 0356) Pulse Rate:  [82-93] 82 (10/01 0820) Resp:  [16] 16 (09/30 1554) BP: (142-157)/(53-61) 146/61 (10/01 1000) SpO2:  [95 %-100 %] 100 % (10/01 0356) Weight:  [121.2 kg] 121.2 kg (10/01 0500) Last BM Date : 08/13/24  Intake/Output from previous day: 09/30 0701 - 10/01 0700 In: 300 [NG/GT:300] Out: 165 [Drains:165] Intake/Output this shift: No intake/output data recorded.  PE: General: Pleasant female who is OOB in recliner.  HEENT: Head is normocephalic, atraumatic.  Heart: HR normal Lungs: Respiratory effort nonlabored Abd: soft, non distended, minimal drainage around drains.  Some drainage lower 1.5-2 inches of wound. Drain #1 murky looks like pancreatic fluid Psych: A&Ox3 with an appropriate affect.    Lab Results:  Recent Labs    08/10/24 1321  WBC 9.4  HGB 10.4*  HCT 33.6*  PLT 611*   BMET Recent Labs    08/10/24 1321 08/12/24 0052  NA 136 139  K 4.4 4.0  CL 106 110  CO2 19* 20*  GLUCOSE 249* 292*  BUN 13 18  CREATININE 0.97 0.95  CALCIUM  8.0* 7.9*   PT/INR No results for input(s): LABPROT, INR in the last 72 hours. CMP     Component Value Date/Time   NA 139 08/12/2024 0052   NA 142 06/18/2024 1445   K 4.0 08/12/2024 0052   CL 110 08/12/2024 0052   CO2 20 (L) 08/12/2024 0052   GLUCOSE 292 (H) 08/12/2024 0052   BUN 18 08/12/2024 0052   BUN 14 06/18/2024 1445   CREATININE 0.95 08/12/2024 0052   CREATININE 1.12 (H) 12/27/2021 0846   CALCIUM  7.9 (L) 08/12/2024 0052   PROT 6.3 (L) 08/12/2024 0052   PROT 7.2 06/18/2024 1445   ALBUMIN  1.6 (L) 08/12/2024 0052   ALBUMIN  4.3 06/18/2024 1445   AST 13 (L) 08/12/2024 0052   ALT 19 08/12/2024 0052   ALKPHOS 83  08/12/2024 0052   BILITOT 0.6 08/12/2024 0052   BILITOT 0.4 06/18/2024 1445   GFRNONAA >60 08/12/2024 0052   Lipase     Component Value Date/Time   LIPASE 34 07/06/2024 0441       Studies/Results: No results found.  Anti-infectives: Anti-infectives (From admission, onward)    Start     Dose/Rate Route Frequency Ordered Stop   08/07/24 0000  vancomycin  (VANCOCIN ) IVPB 1000 mg/200 mL premix  Status:  Discontinued        1,000 mg 200 mL/hr over 60 Minutes Intravenous  Once 08/06/24 1106 08/06/24 1346   08/06/24 2300  vancomycin  (VANCOCIN ) IVPB 1000 mg/200 mL premix        1,000 mg 200 mL/hr over 60 Minutes Intravenous Every 12 hours 08/06/24 1346 08/09/24 2321   08/06/24 1200  vancomycin  (VANCOCIN ) IVPB 1000 mg/200 mL premix  Status:  Discontinued        1,000 mg 200 mL/hr over 60 Minutes Intravenous  Once 08/06/24 1105 08/06/24 1106   08/02/24 1400  piperacillin -tazobactam (ZOSYN ) IVPB 3.375 g        3.375 g 12.5 mL/hr over 240 Minutes Intravenous Every 8 hours 08/01/24 2238 08/10/24 0219   08/01/24 2330  cefTRIAXone  (ROCEPHIN ) injection 1 g       Placed  in And Linked Group   1 g Intramuscular  Once 08/01/24 2238 08/02/24 0015   08/01/24 1800  vancomycin  (VANCOCIN ) IVPB 1000 mg/200 mL premix  Status:  Discontinued        1,000 mg 200 mL/hr over 60 Minutes Intravenous Every 12 hours 08/01/24 1642 08/06/24 1346   08/01/24 1047  vancomycin  variable dose per unstable renal function (pharmacist dosing)  Status:  Discontinued         Does not apply See admin instructions 08/01/24 1047 08/01/24 1646   07/30/24 2300  vancomycin  (VANCOCIN ) IVPB 1000 mg/200 mL premix  Status:  Discontinued        1,000 mg 200 mL/hr over 60 Minutes Intravenous Every 12 hours 07/30/24 1029 08/01/24 1047   07/30/24 1400  piperacillin -tazobactam (ZOSYN ) IVPB 3.375 g  Status:  Discontinued        3.375 g 12.5 mL/hr over 240 Minutes Intravenous Every 8 hours 07/30/24 1225 08/01/24 2238   07/30/24 1115   fluconazole  (DIFLUCAN ) IVPB 400 mg        400 mg 100 mL/hr over 120 Minutes Intravenous Every 24 hours 07/30/24 1025 08/09/24 1221   07/30/24 1100  vancomycin  (VANCOREADY) IVPB 2000 mg/400 mL        2,000 mg 200 mL/hr over 120 Minutes Intravenous  Once 07/30/24 1010 07/30/24 1540   07/28/24 0945  piperacillin -tazobactam (ZOSYN ) IVPB 3.375 g  Status:  Discontinued        3.375 g 12.5 mL/hr over 240 Minutes Intravenous Every 8 hours 07/28/24 0853 07/30/24 1131   07/23/24 0000  ceFAZolin  (ANCEF ) IVPB 2g/100 mL premix        2 g 200 mL/hr over 30 Minutes Intravenous Every 8 hours 07/22/24 2314 07/23/24 0058   07/22/24 0900  ceFAZolin  (ANCEF ) IVPB 2g/100 mL premix        2 g 200 mL/hr over 30 Minutes Intravenous To [facility] 07/21/24 1938 07/22/24 1432        Assessment/Plan POD 22: S/P Whipple procedure creation, jejunostomy by Dr. Aron 07/22/2024.  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    -Hypertensive but stable compared to trends. Afebrile. -Continue pain regimen. -Continue imodium  and fiber. -Antibiotics discontinued 9/27. -Continue PT/OT. -soft diet.  Switched back to 18 hours of feeds. Feeling better with that.  Anticipate that it will take a while for any meaningful caloric intake  - Maintain drains.  - Will continue to follow.   FEN: FLD. Advance to soft diet.   VTE: Heparin  injection ID: vanc/zosyn /diflucan  discontinued 9/27. WBCs ok, no fevers.    Dispo: working toward rehab.     LOS: 40 days   I reviewed specialist notes, nursing notes, last 24 h vitals and pain scores, last 48 h intake and output, last 24 h labs and trends, and last 24 h imaging results.  Jina LITTIE Aron, MD, FACS, FSSO Surgical Oncology, General Surgery, Trauma and Critical [facility], [place] [phone] for weekday/non holidays Check [email or web address] for coverage night/weekend/holidays

## 2024-08-13 NOTE — Progress Notes (Signed)
 Physical Therapy Treatment Patient Details Name: Lauren Gray MRN: [id number] DOB: [birth year] Today's Date: 08/13/2024   History of Present Illness Pt is a 73 y/o female presenting with 1 month of nausea/vomiting. CT abdomen showed SBO from underlying mass. Mass biopsy showed + invasive adenocarcinoma. Requiring TPN. S/p pancreaticoduodenectomy, pancreatic duct stent placement, J-tube placement 9/9. PMH: hypokalemia, HTN, CKD, prediabetes    PT Comments  Pt received up in chair. Pt agreeable and motivated to participate in physical therapy session; she does report feeling discouraged for continuing to feel weak and dizzy. 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 We discussed the progression she has made including being upgraded to a soft diet today. Pt requiring min assist for transitions to standing and ambulating 25 ft with a walker and close chair follow. Pt able to participate in seated exercises and was eager to receive exercises she could work on her own; written HEP provided. Patient will benefit from intensive inpatient follow-up therapy, >3 hours/day to address deficits, maximize functional mobility and decrease caregiver burden.   If plan is discharge home, recommend the following: Assist for transportation;Help with stairs or ramp for entrance;Assistance with cooking/housework;A little help with walking and/or transfers;A little help with bathing/dressing/bathroom   Can travel by private vehicle        Equipment Recommendations  Rolling walker (2 wheels);BSC/3in1    Recommendations for Other Services       Precautions / Restrictions Precautions Precautions: Fall;Other (comment) Precaution/Restrictions Comments: JP drainx2 (L), PEG tube Restrictions Weight Bearing Restrictions Per Provider Order: No     Mobility  Bed Mobility               General bed mobility comments: OOB in chair    Transfers Overall transfer  level: Needs assistance Equipment used: Rolling walker (2 wheels) Transfers: Sit to/from Stand Sit to Stand: Min assist           General transfer comment: Cues for rocking to gain momentum, minA to power up to stand    Ambulation/Gait Ambulation/Gait assistance: Contact guard assist Gait Distance (Feet): 25 Feet Assistive device: Rolling walker (2 wheels) Gait Pattern/deviations: Step-through pattern, Decreased stride length, Trunk flexed Gait velocity: Decreased Gait velocity interpretation: <1.8 ft/sec, indicate of risk for recurrent falls   General Gait Details: Slow, but overall steady pace, pulled chair behind her in case she needed to sit but did not utilize. CGA for safety   Stairs             Wheelchair Mobility     Tilt Bed    Modified Rankin (Stroke Patients Only)       Balance Overall balance assessment: Needs assistance Sitting-balance support: Feet supported, Single extremity supported Sitting balance-Leahy Scale: Fair     Standing balance support: During functional activity, Bilateral upper extremity supported, Reliant on assistive device for balance Standing balance-Leahy Scale: Poor Standing balance comment: Reliant on RW                            Communication Communication Communication: No apparent difficulties  Cognition Arousal: Alert Behavior During Therapy: Anxious   PT - Cognitive impairments: No apparent impairments                         Following commands: Intact      Cueing Cueing Techniques: Verbal cues  Exercises General Exercises -  Lower Extremity Long Arc Quad: AROM, Both, 10 reps, Seated Hip ABduction/ADduction: AROM, Both, 10 reps, Seated Hip Flexion/Marching: AROM, Both, 10 reps, Seated    General Comments        Pertinent Vitals/Pain Pain Assessment Pain Assessment: No/denies pain    Home Living                          Prior Function            PT Goals  (current goals can now be found in the care plan section) Acute Rehab PT Goals Patient Stated Goal: Return to independence Potential to Achieve Goals: Good Progress towards PT goals: Progressing toward goals    Frequency    Min 3X/week      PT Plan      Co-evaluation              AM-PAC PT 6 Clicks Mobility   Outcome Measure  Help needed turning from your back to your side while in a flat bed without using bedrails?: A Little Help needed moving from lying on your back to sitting on the side of a flat bed without using bedrails?: A Little Help needed moving to and from a bed to a chair (including a wheelchair)?: A Little Help needed standing up from a chair using your arms (e.g., wheelchair or bedside chair)?: A Little Help needed to walk in hospital room?: A Little Help needed climbing 3-5 steps with a railing? : Total 6 Click Score: 16    End of Session Equipment Utilized During Treatment: Gait belt Activity Tolerance: Patient tolerated treatment well Patient left: in chair;with call bell/phone within reach;with chair alarm set;with family/visitor present Nurse Communication: Mobility status PT Visit Diagnosis: Other abnormalities of gait and mobility (R26.89);Pain;Muscle weakness (generalized) (M62.81)     Time: 9044-8974 PT Time Calculation (min) (ACUTE ONLY): 30 min  Charges:    $Therapeutic Activity: 23-37 mins PT General Charges $$ ACUTE PT VISIT: 1 Visit                     Aleck Daring, PT, DPT Acute Rehabilitation Services Office [phone]    Aleck ONEIDA Daring 08/13/2024, 12:37 PM

## 2024-08-13 NOTE — Progress Notes (Signed)
  Progress Note   Patient: Lauren Gray FMW:[id number] DOB: 1951-01-03 DOA: 07/04/2024     40 DOS: the patient was seen and examined on 08/13/2024 at 10:58AM      Brief hospital course: 73 y.o. 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  Avoid overtreatment Salbador et al Jenkins Int Med 2024). - Continue amlodipine , Coreg,   Pre-diabetes - Continue insulin   Class 3 obesity         Subjective: Notes weakness, fatigue, malaise, dizziness.  No focal pain.  No fever, no respiratory symptoms, no new nursing concerns.     Physical Exam: BP (!) 155/92 (BP Location: Left Arm)   Pulse 89   Temp 97.8 F (36.6 C)   Resp 18   Ht 5' 8 (1.727 m)   Wt 121.2 kg   SpO2 96%   BMI 40.63 kg/m   General: Sitting up in recliner, interactive, no acute distress, tired Cardiovascular: RRR, nl S1-S2, no murmurs appreciated.   No LE edema.   Neuro/Psych: Attention normal, diffuse weakness, oriented x3.       Family Communication: Husband    Disposition: Status is: Inpatient         Author: Lonni SHAUNNA Dalton, MD 08/13/2024 6:29 PM  For on call review [email or web address].

## 2024-08-13 NOTE — Progress Notes (Signed)
 Mobility Specialist Progress Note:    08/13/24 1341  Mobility  Activity Ambulated with assistance (To BR)  Level of Assistance Minimal assist, patient does 75% or more  Assistive Device Front wheel walker  Distance Ambulated (ft) 20 ft  Activity Response Tolerated well  Mobility Referral Yes  Mobility visit 1 Mobility  Mobility Specialist Start Time (ACUTE ONLY) 1145  Mobility Specialist Stop Time (ACUTE ONLY) 1155  Mobility Specialist Time Calculation (min) (ACUTE ONLY) 10 min   Receive pt in chair and assisted to BR. Pt required MinA STS, otherwise contact guard. No c/o. Assisted pt to bed. Pt left in bed with personal belongings and call light within reach. All needs met.  Lavanda Pollack Mobility Specialist  Please contact via [employer] or  Rehab Office [phone]

## 2024-08-13 NOTE — Progress Notes (Signed)
   Inpatient Rehabilitation Admissions Coordinator   I met with patient and her spouse at bedside. Discussed possible CIR admit. I await updated PT notes today and then will begin Auth with [employer].  Heron Leavell, RN, MSN Rehab Admissions Coordinator [phone] 08/13/2024 12:33 PM

## 2024-08-13 NOTE — Plan of Care (Signed)
   Problem: Activity: Goal: Risk for activity intolerance will decrease Outcome: Progressing   Problem: Nutrition: Goal: Adequate nutrition will be maintained Outcome: Progressing

## 2024-08-14 DIAGNOSIS — K56609 Unspecified intestinal obstruction, unspecified as to partial versus complete obstruction: Secondary | ICD-10-CM | POA: Diagnosis not present

## 2024-08-14 LAB — GLUCOSE, CAPILLARY
Glucose-Capillary: 267 mg/dL — ABNORMAL HIGH (ref 70–99)
Glucose-Capillary: 133 mg/dL — ABNORMAL HIGH (ref 70–99)
Glucose-Capillary: 143 mg/dL — ABNORMAL HIGH (ref 70–99)
Glucose-Capillary: 147 mg/dL — ABNORMAL HIGH (ref 70–99)

## 2024-08-14 LAB — BASIC METABOLIC PANEL WITH GFR
Anion gap: 10 (ref 5–15)
BUN: 20 mg/dL (ref 8–23)
CO2: 21 mmol/L — ABNORMAL LOW (ref 22–32)
Calcium: 8.2 mg/dL — ABNORMAL LOW (ref 8.9–10.3)
Chloride: 109 mmol/L (ref 98–111)
Creatinine, Ser: 0.99 mg/dL (ref 0.44–1.00)
GFR, Estimated: >60 mL/min (ref >60)
Glucose, Bld: 259 mg/dL — ABNORMAL HIGH (ref 70–99)
Potassium: 4.4 mmol/L (ref 3.5–5.1)
Sodium: 140 mmol/L (ref 135–145)

## 2024-08-14 LAB — CBC
HCT: 31.1 % — ABNORMAL LOW (ref 36.0–46.0)
Hemoglobin: 9.4 g/dL — ABNORMAL LOW (ref 12.0–15.0)
MCH: 28.4 pg (ref 26.0–34.0)
MCHC: 30.2 g/dL (ref 30.0–36.0)
MCV: 94 fL (ref 80.0–100.0)
Platelets: 448 K/uL — ABNORMAL HIGH (ref 150–400)
RBC: 3.31 MIL/uL — ABNORMAL LOW (ref 3.87–5.11)
RDW: 15.3 % (ref 11.5–15.5)
WBC: 10 K/uL (ref 4.0–10.5)
nRBC: 0 % (ref 0.0–0.2)

## 2024-08-14 LAB — HEPATIC FUNCTION PANEL
ALT: 21 U/L (ref 0–44)
AST: 18 U/L (ref 15–41)
Albumin: 1.7 g/dL — ABNORMAL LOW (ref 3.5–5.0)
Alkaline Phosphatase: 117 U/L (ref 38–126)
Bilirubin, Direct: 0.2 mg/dL (ref 0.0–0.2)
Indirect Bilirubin: 0.5 mg/dL (ref 0.3–0.9)
Total Bilirubin: 0.7 mg/dL (ref 0.0–1.2)
Total Protein: 6.9 g/dL (ref 6.5–8.1)

## 2024-08-14 MED ORDER — ALTEPLASE 2 MG IJ SOLR
2.0000 mg | Freq: Once | INTRAMUSCULAR | Status: AC
  Administered 2024-08-14: 2 mg
  Filled 2024-08-14: qty 2

## 2024-08-14 NOTE — Progress Notes (Signed)
 No blood return from either port on PICC.  Leita RN notified of order placed for tpa and to notify VAS Team with IV team consult for declotting.   1055 am Secure chat with Leita RN states med is on unit.  This nurse looked at tube stations,pt room and med rooms, unable to locate medication.  Secure chat sent to Leita PEAK for assistance with locating medication. No response at this time.

## 2024-08-14 NOTE — Progress Notes (Signed)
 Patient called out and wanted to return to bed. Stated that she is no longer feeling nauseated and that she got a little rest in the chair. Assisted patient with ambulating back to the bed. Offered to assist her to the restroom but she declined. Emptied another clear, slightly cloudy substance from JP drain. Patient's dressings were also changed earlier when she got a bath. Resting in bed with eyes closed at this time.

## 2024-08-14 NOTE — Progress Notes (Signed)
  Progress Note   Patient: Lauren Gray FMW:[id number] DOB: [birth year] DOA: 07/04/2024     41 DOS: the patient was seen and examined on 08/14/2024 at 8:57AM      Brief hospital course: 73 y.o. 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  No new fever. - Diet, analgesics and bowel regimen per surgery - Continue tube feeds  Anemia Has been stable - Trend Hgb  Hypertension BP slightly high.    - Continue amlodipine , Coreg   Pre-diabetes - Continue insulin   Class 3 obesity         Subjective: Had an episode of nausea and incontinence of urine overnight.  None so far today.  Tolerating soft diet.       Physical Exam: BP (!) 158/64 (BP Location: Left Arm)   Pulse 90   Temp 98.5 F (36.9 C) (Oral)   Resp 16   Ht 5' 8 (1.727 m)   Wt 121.2 kg   SpO2 95%   BMI 40.63 kg/m   General: Sitting up in bed, brushing teeth, no acute distress Cardiovascular: RRR, nl S1-S2, no murmurs appreciated.   No LE edema.   Respiratory: Lungs clear, no rales or wheezes. Neuro/Psych: Attention normal, diffuse weakness, oriented x3.       Family Communication: Daughter and grandson at bedside    Disposition: Status is: Inpatient         Author: Lonni SHAUNNA Dalton, MD 08/14/2024 7:57 PM  For on call review [email or web address].

## 2024-08-14 NOTE — Progress Notes (Signed)
   Inpatient Rehabilitation Admissions Coordinator   I await insurance determination from [facility] for possible Cir admit.  Heron Leavell, RN, MSN Rehab Admissions Coordinator [phone] 08/14/2024 8:40 AM

## 2024-08-14 NOTE — Progress Notes (Signed)
 Physical Therapy Treatment Patient Details Name: Lauren Gray MRN: [id number] DOB: November 18, 1950 Today's Date: 08/14/2024   History of Present Illness Pt is a 73 y/o female presenting with 1 month of nausea/vomiting. CT abdomen showed SBO from underlying mass. Mass biopsy showed + invasive adenocarcinoma. Requiring TPN. S/p pancreaticoduodenectomy, pancreatic duct stent placement, J-tube placement 9/9. PMH: hypokalemia, HTN, CKD, prediabetes    PT Comments  Pt up in chair on arrival, pleasant and agreeable to session with steady progress towards acute goals. Pt continues to be limited by nausea and mild c/o dizziness with standing activity. Educated and discussed gaze stabilization techniques to implement when up and ambulating with pt verbalizing understanding. Pt demonstrating transfers and gait with CGA-min A with RW for support with gait distance limited by fatigue. Pt declining further gait trials or exercises this session due to continued nausea. Pt continues to benefit from skilled PT services to progress toward functional mobility goals.     If plan is discharge home, recommend the following: Assist for transportation;Help with stairs or ramp for entrance;Assistance with cooking/housework;A little help with walking and/or transfers;A little help with bathing/dressing/bathroom   Can travel by private vehicle        Equipment Recommendations  Rolling walker (2 wheels);BSC/3in1    Recommendations for Other Services       Precautions / Restrictions Precautions Precautions: Fall;Other (comment) Precaution/Restrictions Comments: JP drainx2 (L), PEG tube Restrictions Weight Bearing Restrictions Per Provider Order: No     Mobility  Bed Mobility Overal bed mobility: Needs Assistance             General bed mobility comments: OOB in chair    Transfers Overall transfer level: Needs assistance Equipment used: Rolling walker (2 wheels) Transfers: Sit to/from Stand Sit to Stand:  Min assist           General transfer comment: Cues for rocking to gain momentum, minA to power up to stand    Ambulation/Gait Ambulation/Gait assistance: Contact guard assist Gait Distance (Feet): 15 Feet (+ 25) Assistive device: Rolling walker (2 wheels) Gait Pattern/deviations: Step-through pattern, Decreased stride length, Trunk flexed Gait velocity: Decreased     General Gait Details: Slow, but overall steady pace, cues for closer proximity to RW especially with turning, pt with some c/o dizziness in standing, discused and educated on gaze stabilization techniques when up ambulating   Stairs             Wheelchair Mobility     Tilt Bed    Modified Rankin (Stroke Patients Only)       Balance Overall balance assessment: Needs assistance Sitting-balance support: Feet supported, Single extremity supported Sitting balance-Leahy Scale: Fair Sitting balance - Comments: Able to support self with UE sitting EOB   Standing balance support: During functional activity, Bilateral upper extremity supported, Reliant on assistive device for balance Standing balance-Leahy Scale: Poor Standing balance comment: Reliant on RW                            Communication Communication Communication: No apparent difficulties  Cognition Arousal: Alert Behavior During Therapy: Anxious   PT - Cognitive impairments: No apparent impairments                         Following commands: Intact      Cueing Cueing Techniques: Verbal cues  Exercises Other Exercises Other Exercises: pt declining further exercises due to nausea  General Comments        Pertinent Vitals/Pain Pain Assessment Pain Assessment: Faces Faces Pain Scale: Hurts little more Pain Location: Abdomen with activity Pain Descriptors / Indicators: Grimacing, Aching Pain Intervention(s): Monitored during session, Limited activity within patient's tolerance    Home Living                           Prior Function            PT Goals (current goals can now be found in the care plan section) Acute Rehab PT Goals Patient Stated Goal: Return to independence PT Goal Formulation: With patient Time For Goal Achievement: 08/21/24 Progress towards PT goals: Progressing toward goals    Frequency    Min 3X/week      PT Plan      Co-evaluation              AM-PAC PT 6 Clicks Mobility   Outcome Measure  Help needed turning from your back to your side while in a flat bed without using bedrails?: A Little Help needed moving from lying on your back to sitting on the side of a flat bed without using bedrails?: A Little Help needed moving to and from a bed to a chair (including a wheelchair)?: A Little Help needed standing up from a chair using your arms (e.g., wheelchair or bedside chair)?: A Little Help needed to walk in hospital room?: A Little Help needed climbing 3-5 steps with a railing? : Total 6 Click Score: 16    End of Session Equipment Utilized During Treatment: Gait belt Activity Tolerance: Patient tolerated treatment well Patient left: in chair;with call bell/phone within reach;with family/visitor present Nurse Communication: Mobility status PT Visit Diagnosis: Other abnormalities of gait and mobility (R26.89);Pain;Muscle weakness (generalized) (M62.81) Pain - part of body:  (abdomen)     Time: 8570-8545 PT Time Calculation (min) (ACUTE ONLY): 25 min  Charges:    $Therapeutic Activity: 23-37 mins PT General Charges $$ ACUTE PT VISIT: 1 Visit                     Lauren Gray R. PTA Acute Rehabilitation Services Office: [phone]   Lauren Gray 08/14/2024, 3:46 PM

## 2024-08-14 NOTE — Progress Notes (Signed)
 23 Days Post-Op  Subjective: CC: Seen with attending.  Patient denies any abdominal pain. Did well with soft diet last night without n/v. Developed some nausea later in the evening and had an episode of emesis this am when she was being moved in bed. No current nausea. Still passing flatus. Last BM 2 days ago per report (last BM 10/1 on I/O). Voiding. Working with therapies.   Objective: Vital signs in last 24 hours: Temp:  [97.8 F (36.6 C)-98.4 F (36.9 C)] 98.4 F (36.9 C) (10/02 0752) Pulse Rate:  [82-99] 99 (10/02 0752) Resp:  [15-18] 15 (10/02 0752) BP: (146-164)/(57-92) 162/57 (10/02 0752) SpO2:  [95 %-100 %] 100 % (10/02 0752) Last BM Date : 08/13/24  Intake/Output from previous day: 10/01 0701 - 10/02 0700 In: 1140 [P.O.:840; NG/GT:300] Out: 300 [Drains:300] Intake/Output this shift: No intake/output data recorded.  PE: Gen:  Alert, NAD, pleasant Abd: Soft, mild distension, mild epigastric and luq ttp. Midline incision with inferior aspect w/ skin open w/ healthy granulation tissue at the base - clean and without drainage. Remainder of midline w/ steri-strips in place, cdi.  J-tube currently clamped, site cdi. Drain 1 with milky purulent drainage. Drain 2 with cloudy serous drainage.   Lab Results:  Recent Labs    08/14/24 0347  WBC 10.0  HGB 9.4*  HCT 31.1*  PLT 448*   BMET Recent Labs    08/12/24 0052 08/14/24 0347  NA 139 140  K 4.0 4.4  CL 110 109  CO2 20* 21*  GLUCOSE 292* 259*  BUN 18 20  CREATININE 0.95 0.99  CALCIUM  7.9* 8.2*   PT/INR No results for input(s): LABPROT, INR in the last 72 hours. CMP     Component Value Date/Time   NA 140 08/14/2024 0347   NA 142 06/18/2024 1445   K 4.4 08/14/2024 0347   CL 109 08/14/2024 0347   CO2 21 (L) 08/14/2024 0347   GLUCOSE 259 (H) 08/14/2024 0347   BUN 20 08/14/2024 0347   BUN 14 06/18/2024 1445   CREATININE 0.99 08/14/2024 0347   CREATININE 1.12 (H) 12/27/2021 0846   CALCIUM  8.2 (L)  08/14/2024 0347   PROT 6.9 08/14/2024 0347   PROT 7.2 06/18/2024 1445   ALBUMIN  1.7 (L) 08/14/2024 0347   ALBUMIN  4.3 06/18/2024 1445   AST 18 08/14/2024 0347   ALT 21 08/14/2024 0347   ALKPHOS 117 08/14/2024 0347   BILITOT 0.7 08/14/2024 0347   BILITOT 0.4 06/18/2024 1445   GFRNONAA >60 08/14/2024 0347   Lipase     Component Value Date/Time   LIPASE 34 07/06/2024 0441    Studies/Results: No results found.  Anti-infectives: Anti-infectives (From admission, onward)    Start     Dose/Rate Route Frequency Ordered Stop   08/07/24 0000  vancomycin  (VANCOCIN ) IVPB 1000 mg/200 mL premix  Status:  Discontinued        1,000 mg 200 mL/hr over 60 Minutes Intravenous  Once 08/06/24 1106 08/06/24 1346   08/06/24 2300  vancomycin  (VANCOCIN ) IVPB 1000 mg/200 mL premix        1,000 mg 200 mL/hr over 60 Minutes Intravenous Every 12 hours 08/06/24 1346 08/09/24 2321   08/06/24 1200  vancomycin  (VANCOCIN ) IVPB 1000 mg/200 mL premix  Status:  Discontinued        1,000 mg 200 mL/hr over 60 Minutes Intravenous  Once 08/06/24 1105 08/06/24 1106   08/02/24 1400  piperacillin -tazobactam (ZOSYN ) IVPB 3.375 g  3.375 g 12.5 mL/hr over 240 Minutes Intravenous Every 8 hours 08/01/24 2238 08/10/24 0219   08/01/24 2330  cefTRIAXone  (ROCEPHIN ) injection 1 g       Placed in And Linked Group   1 g Intramuscular  Once 08/01/24 2238 08/02/24 0015   08/01/24 1800  vancomycin  (VANCOCIN ) IVPB 1000 mg/200 mL premix  Status:  Discontinued        1,000 mg 200 mL/hr over 60 Minutes Intravenous Every 12 hours 08/01/24 1642 08/06/24 1346   08/01/24 1047  vancomycin  variable dose per unstable renal function (pharmacist dosing)  Status:  Discontinued         Does not apply See admin instructions 08/01/24 1047 08/01/24 1646   07/30/24 2300  vancomycin  (VANCOCIN ) IVPB 1000 mg/200 mL premix  Status:  Discontinued        1,000 mg 200 mL/hr over 60 Minutes Intravenous Every 12 hours 07/30/24 1029 08/01/24 1047    07/30/24 1400  piperacillin -tazobactam (ZOSYN ) IVPB 3.375 g  Status:  Discontinued        3.375 g 12.5 mL/hr over 240 Minutes Intravenous Every 8 hours 07/30/24 1225 08/01/24 2238   07/30/24 1115  fluconazole  (DIFLUCAN ) IVPB 400 mg        400 mg 100 mL/hr over 120 Minutes Intravenous Every 24 hours 07/30/24 1025 08/09/24 1221   07/30/24 1100  vancomycin  (VANCOREADY) IVPB 2000 mg/400 mL        2,000 mg 200 mL/hr over 120 Minutes Intravenous  Once 07/30/24 1010 07/30/24 1540   07/28/24 0945  piperacillin -tazobactam (ZOSYN ) IVPB 3.375 g  Status:  Discontinued        3.375 g 12.5 mL/hr over 240 Minutes Intravenous Every 8 hours 07/28/24 0853 07/30/24 1131   07/23/24 0000  ceFAZolin  (ANCEF ) IVPB 2g/100 mL premix        2 g 200 mL/hr over 30 Minutes Intravenous Every 8 hours 07/22/24 2314 07/23/24 0058   07/22/24 0900  ceFAZolin  (ANCEF ) IVPB 2g/100 mL premix        2 g 200 mL/hr over 30 Minutes Intravenous To [facility] 07/21/24 1938 07/22/24 1432        Assessment/Plan POD 23 s/p Whipple procedure creation, jejunostomy by Dr. Aron 07/22/2024.  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  - Cont soft diet, shakes and 18 hour TF's. Continue current rate and duration of TF's to aid in healing till PO calorie intake improves. If develops recurrent vomiting or nausea related to po intake can get xray and back down diet - Completed abx 9/27. No current indication for abx. Monitor.  - On fiber and imodium . Continue and monitor BM's - Continue pain regimen. - Continue PT/OT. Recommending CIR - Cont drains.  - Will continue to follow. Discussed w/ [facility]   FEN: Soft, IVF per primary  VTE: SCDs, SQH ID: Vanc/zosyn /diflucan  discontinued 9/27. WBCs wnl. Afebrile.      Dispo: working toward rehab.     LOS: 41 days    Ozell CHRISTELLA Shaper, [facility] 08/14/2024, 8:56 AM Please see Amion for pager number during day hours  7:00am-4:30pm

## 2024-08-14 NOTE — Plan of Care (Signed)
   Problem: Activity: Goal: Risk for activity intolerance will decrease Outcome: Progressing   Problem: Nutrition: Goal: Adequate nutrition will be maintained Outcome: Progressing   Problem: Pain Managment: Goal: General experience of comfort will improve and/or be controlled Outcome: Progressing

## 2024-08-14 NOTE — Progress Notes (Signed)
 Patient called out because she had wet herself. Wanted to get up. Nurse and tech gave her a bath and assisted her as she ambulated to the chair in her room. Feet were elevated and patient was made comfortable. Patient had some nausea and was given PRN zofran . 100 ml clear, slightly cloudy substance emptied from JP drain.

## 2024-08-14 NOTE — Progress Notes (Signed)
 Mobility Specialist Progress Note:    08/14/24 1319  Mobility  Activity Pivoted/transferred from bed to chair  Level of Assistance Minimal assist, patient does 75% or more  Assistive Device Front wheel walker  Distance Ambulated (ft) 10 ft  Activity Response Tolerated well  Mobility Referral Yes  Mobility visit 1 Mobility  Mobility Specialist Start Time (ACUTE ONLY) 1306  Mobility Specialist Stop Time (ACUTE ONLY) 1316  Mobility Specialist Time Calculation (min) (ACUTE ONLY) 10 min   Received pt in bed and agreeable to mobility. Pt required MinA STS, otherwise contact guard. No c/o. Left pt in chair with personal belongings and call light within reach. All needs met.  Lavanda Pollack Mobility Specialist  Please contact via [employer] or  Rehab Office [phone]

## 2024-08-15 ENCOUNTER — Other Ambulatory Visit: Payer: Self-pay

## 2024-08-15 ENCOUNTER — Encounter (HOSPITAL_COMMUNITY): Payer: Self-pay | Admitting: Physical Medicine & Rehabilitation

## 2024-08-15 ENCOUNTER — Inpatient Hospital Stay (HOSPITAL_COMMUNITY)
Admission: RE | Admit: 2024-08-15 | Discharge: 2024-09-04 | DRG: 945 | Disposition: A | Discharge status: Home Health | Source: Intra-hospital | Attending: Physical Medicine & Rehabilitation | Admitting: Physical Medicine & Rehabilitation

## 2024-08-15 ENCOUNTER — Inpatient Hospital Stay (HOSPITAL_COMMUNITY)

## 2024-08-15 DIAGNOSIS — N179 Acute kidney failure, unspecified: Secondary | ICD-10-CM | POA: Diagnosis present

## 2024-08-15 DIAGNOSIS — T8131XD Disruption of external operation (surgical) wound, not elsewhere classified, subsequent encounter: Secondary | ICD-10-CM | POA: Diagnosis not present

## 2024-08-15 DIAGNOSIS — J90 Pleural effusion, not elsewhere classified: Secondary | ICD-10-CM | POA: Diagnosis present

## 2024-08-15 DIAGNOSIS — Z91040 Latex allergy status: Secondary | ICD-10-CM

## 2024-08-15 DIAGNOSIS — C170 Malignant neoplasm of duodenum: Secondary | ICD-10-CM | POA: Diagnosis present

## 2024-08-15 DIAGNOSIS — R5383 Other fatigue: Secondary | ICD-10-CM | POA: Diagnosis present

## 2024-08-15 DIAGNOSIS — R5381 Other malaise: Secondary | ICD-10-CM | POA: Diagnosis present

## 2024-08-15 DIAGNOSIS — D62 Acute posthemorrhagic anemia: Secondary | ICD-10-CM | POA: Diagnosis present

## 2024-08-15 DIAGNOSIS — R140 Abdominal distension (gaseous): Secondary | ICD-10-CM | POA: Diagnosis not present

## 2024-08-15 DIAGNOSIS — E66812 Obesity, class 2: Secondary | ICD-10-CM | POA: Diagnosis present

## 2024-08-15 DIAGNOSIS — Z79899 Other long term (current) drug therapy: Secondary | ICD-10-CM

## 2024-08-15 DIAGNOSIS — K8681 Exocrine pancreatic insufficiency: Secondary | ICD-10-CM | POA: Diagnosis present

## 2024-08-15 DIAGNOSIS — I129 Hypertensive chronic kidney disease with stage 1 through stage 4 chronic kidney disease, or unspecified chronic kidney disease: Secondary | ICD-10-CM | POA: Diagnosis present

## 2024-08-15 DIAGNOSIS — F4321 Adjustment disorder with depressed mood: Secondary | ICD-10-CM | POA: Diagnosis present

## 2024-08-15 DIAGNOSIS — B961 Klebsiella pneumoniae [K. pneumoniae] as the cause of diseases classified elsewhere: Secondary | ICD-10-CM | POA: Diagnosis not present

## 2024-08-15 DIAGNOSIS — K651 Peritoneal abscess: Secondary | ICD-10-CM | POA: Diagnosis not present

## 2024-08-15 DIAGNOSIS — T8143XA Infection following a procedure, organ and space surgical site, initial encounter: Secondary | ICD-10-CM | POA: Insufficient documentation

## 2024-08-15 DIAGNOSIS — B965 Pseudomonas (aeruginosa) (mallei) (pseudomallei) as the cause of diseases classified elsewhere: Secondary | ICD-10-CM | POA: Diagnosis not present

## 2024-08-15 DIAGNOSIS — Z794 Long term (current) use of insulin: Secondary | ICD-10-CM

## 2024-08-15 DIAGNOSIS — Z90710 Acquired absence of both cervix and uterus: Secondary | ICD-10-CM

## 2024-08-15 DIAGNOSIS — R197 Diarrhea, unspecified: Secondary | ICD-10-CM | POA: Diagnosis not present

## 2024-08-15 DIAGNOSIS — Z6838 Body mass index (BMI) 38.0-38.9, adult: Secondary | ICD-10-CM

## 2024-08-15 DIAGNOSIS — Z931 Gastrostomy status: Secondary | ICD-10-CM | POA: Diagnosis not present

## 2024-08-15 DIAGNOSIS — E43 Unspecified severe protein-calorie malnutrition: Secondary | ICD-10-CM | POA: Diagnosis present

## 2024-08-15 DIAGNOSIS — B370 Candidal stomatitis: Secondary | ICD-10-CM | POA: Diagnosis present

## 2024-08-15 DIAGNOSIS — I10 Essential (primary) hypertension: Secondary | ICD-10-CM | POA: Diagnosis not present

## 2024-08-15 DIAGNOSIS — K567 Ileus, unspecified: Secondary | ICD-10-CM | POA: Diagnosis not present

## 2024-08-15 DIAGNOSIS — E8770 Fluid overload, unspecified: Secondary | ICD-10-CM | POA: Diagnosis not present

## 2024-08-15 DIAGNOSIS — Z885 Allergy status to narcotic agent status: Secondary | ICD-10-CM

## 2024-08-15 DIAGNOSIS — K56609 Unspecified intestinal obstruction, unspecified as to partial versus complete obstruction: Secondary | ICD-10-CM | POA: Diagnosis not present

## 2024-08-15 DIAGNOSIS — C259 Malignant neoplasm of pancreas, unspecified: Secondary | ICD-10-CM | POA: Insufficient documentation

## 2024-08-15 DIAGNOSIS — Z8249 Family history of ischemic heart disease and other diseases of the circulatory system: Secondary | ICD-10-CM

## 2024-08-15 DIAGNOSIS — N182 Chronic kidney disease, stage 2 (mild): Secondary | ICD-10-CM | POA: Diagnosis present

## 2024-08-15 DIAGNOSIS — E440 Moderate protein-calorie malnutrition: Secondary | ICD-10-CM | POA: Diagnosis not present

## 2024-08-15 DIAGNOSIS — K9189 Other postprocedural complications and disorders of digestive system: Secondary | ICD-10-CM | POA: Diagnosis present

## 2024-08-15 DIAGNOSIS — Z90411 Acquired partial absence of pancreas: Secondary | ICD-10-CM

## 2024-08-15 DIAGNOSIS — Z833 Family history of diabetes mellitus: Secondary | ICD-10-CM

## 2024-08-15 DIAGNOSIS — R7303 Prediabetes: Secondary | ICD-10-CM | POA: Diagnosis present

## 2024-08-15 DIAGNOSIS — E8809 Other disorders of plasma-protein metabolism, not elsewhere classified: Secondary | ICD-10-CM | POA: Diagnosis not present

## 2024-08-15 LAB — GLUCOSE, CAPILLARY
Glucose-Capillary: 235 mg/dL — ABNORMAL HIGH (ref 70–99)
Glucose-Capillary: 183 mg/dL — ABNORMAL HIGH (ref 70–99)
Glucose-Capillary: 175 mg/dL — ABNORMAL HIGH (ref 70–99)
Glucose-Capillary: 191 mg/dL — ABNORMAL HIGH (ref 70–99)

## 2024-08-15 MED ORDER — TRAZODONE HCL 50 MG PO TABS
50.0000 mg | ORAL_TABLET | Freq: Every day | ORAL | Status: DC
Start: 2024-08-15 — End: 2024-09-04
  Administered 2024-08-15 – 2024-09-03 (×20): 50 mg
  Filled 2024-08-15 (×20): qty 1

## 2024-08-15 MED ORDER — GUAIFENESIN-DM 100-10 MG/5ML PO SYRP: 5.0000 mL | ORAL_SOLUTION | Freq: Four times a day (QID) | ORAL | Status: DC | PRN

## 2024-08-15 MED ORDER — BANATROL TF EN LIQD
60.0000 mL | Freq: Two times a day (BID) | ENTERAL | Status: DC
  Administered 2024-08-15 – 2024-09-04 (×40): 60 mL
  Filled 2024-08-15 (×42): qty 60

## 2024-08-15 MED ORDER — PROSOURCE TF20 ENFIT COMPATIBL EN LIQD: 60.0000 mL | Freq: Every day | ENTERAL | Status: DC

## 2024-08-15 MED ORDER — TRAZODONE HCL 50 MG PO TABS: 50.0000 mg | ORAL_TABLET | Freq: Every day | ORAL | Status: DC

## 2024-08-15 MED ORDER — SODIUM CHLORIDE 0.9 % IV SOLN: 12.5000 mg | Freq: Four times a day (QID) | INTRAVENOUS | Status: DC | PRN

## 2024-08-15 MED ORDER — FLEET ENEMA RE ENEM: 1.0000 | ENEMA | Freq: Once | RECTAL | Status: DC | PRN

## 2024-08-15 MED ORDER — ENOXAPARIN SODIUM 60 MG/0.6ML IJ SOSY
60.0000 mg | PREFILLED_SYRINGE | INTRAMUSCULAR | Status: DC
  Administered 2024-08-15 – 2024-08-21 (×7): 60 mg via SUBCUTANEOUS
  Filled 2024-08-15 (×7): qty 0.6

## 2024-08-15 MED ORDER — METHOCARBAMOL 1000 MG/10ML IJ SOLN
500.0000 mg | Freq: Three times a day (TID) | INTRAMUSCULAR | Status: DC
  Filled 2024-08-15 (×2): qty 5

## 2024-08-15 MED ORDER — GABAPENTIN 250 MG/5ML PO SOLN
100.0000 mg | Freq: Three times a day (TID) | ORAL | Status: DC
Start: 2024-08-15 — End: 2024-09-04
  Administered 2024-08-15 – 2024-09-04 (×57): 100 mg
  Filled 2024-08-15 (×61): qty 2

## 2024-08-15 MED ORDER — MORPHINE SULFATE (PF) 2 MG/ML IV SOLN: 2.0000 mg | INTRAVENOUS | Status: DC | PRN

## 2024-08-15 MED ORDER — METHOCARBAMOL 500 MG PO TABS
500.0000 mg | ORAL_TABLET | Freq: Three times a day (TID) | ORAL | Status: DC
  Administered 2024-08-15 – 2024-09-04 (×58): 500 mg via ORAL
  Filled 2024-08-15 (×58): qty 1

## 2024-08-15 MED ORDER — CHLORHEXIDINE GLUCONATE CLOTH 2 % EX PADS: 6.0000 | MEDICATED_PAD | Freq: Every day | CUTANEOUS | Status: DC

## 2024-08-15 MED ORDER — DIPHENHYDRAMINE HCL 25 MG PO CAPS: 25.0000 mg | ORAL_CAPSULE | Freq: Four times a day (QID) | ORAL | Status: DC | PRN

## 2024-08-15 MED ORDER — PANCRELIPASE (LIP-PROT-AMYL) 24000-76000 UNITS PO CPEP: 24000.0000 [IU] | ORAL_CAPSULE | Freq: Three times a day (TID) | ORAL | Status: DC

## 2024-08-15 MED ORDER — GLYCERIN (LAXATIVE) 2 G RE SUPP: 1.0000 | RECTAL | Status: DC | PRN

## 2024-08-15 MED ORDER — OSMOLITE 1.5 CAL PO LIQD: 1260.0000 mL | ORAL | Status: DC

## 2024-08-15 MED ORDER — INSULIN ASPART 100 UNIT/ML IJ SOLN
0.0000 [IU] | Freq: Three times a day (TID) | INTRAMUSCULAR | Status: DC
  Administered 2024-08-16: 2 [IU] via SUBCUTANEOUS
  Administered 2024-08-16: 11 [IU] via SUBCUTANEOUS
  Administered 2024-08-16: 3 [IU] via SUBCUTANEOUS
  Administered 2024-08-17: 5 [IU] via SUBCUTANEOUS
  Administered 2024-08-17: 8 [IU] via SUBCUTANEOUS
  Administered 2024-08-17: 5 [IU] via SUBCUTANEOUS
  Administered 2024-08-18: 8 [IU] via SUBCUTANEOUS
  Administered 2024-08-18: 5 [IU] via SUBCUTANEOUS
  Administered 2024-08-18: 2 [IU] via SUBCUTANEOUS
  Administered 2024-08-19: 11 [IU] via SUBCUTANEOUS
  Administered 2024-08-19: 5 [IU] via SUBCUTANEOUS
  Administered 2024-08-19: 2 [IU] via SUBCUTANEOUS
  Administered 2024-08-20: 3 [IU] via SUBCUTANEOUS
  Administered 2024-08-20: 8 [IU] via SUBCUTANEOUS
  Administered 2024-08-20 – 2024-08-21 (×2): 5 [IU] via SUBCUTANEOUS
  Administered 2024-08-21: 11 [IU] via SUBCUTANEOUS
  Administered 2024-08-21 – 2024-08-22 (×2): 3 [IU] via SUBCUTANEOUS
  Administered 2024-08-22: 11 [IU] via SUBCUTANEOUS
  Administered 2024-08-22: 2 [IU] via SUBCUTANEOUS
  Administered 2024-08-23: 3 [IU] via SUBCUTANEOUS
  Administered 2024-08-23: 11 [IU] via SUBCUTANEOUS
  Administered 2024-08-23: 8 [IU] via SUBCUTANEOUS
  Administered 2024-08-24: 15 [IU] via SUBCUTANEOUS
  Administered 2024-08-24: 8 [IU] via SUBCUTANEOUS
  Administered 2024-08-24: 2 [IU] via SUBCUTANEOUS
  Administered 2024-08-25: 11 [IU] via SUBCUTANEOUS
  Administered 2024-08-25: 3 [IU] via SUBCUTANEOUS
  Administered 2024-08-26 – 2024-08-27 (×2): 11 [IU] via SUBCUTANEOUS
  Administered 2024-08-27: 3 [IU] via SUBCUTANEOUS
  Administered 2024-08-27: 5 [IU] via SUBCUTANEOUS
  Administered 2024-08-28 (×2): 8 [IU] via SUBCUTANEOUS
  Administered 2024-08-28: 3 [IU] via SUBCUTANEOUS
  Administered 2024-08-29: 11 [IU] via SUBCUTANEOUS
  Administered 2024-08-30: 8 [IU] via SUBCUTANEOUS
  Administered 2024-08-30: 2 [IU] via SUBCUTANEOUS
  Administered 2024-08-30: 3 [IU] via SUBCUTANEOUS
  Administered 2024-08-31: 5 [IU] via SUBCUTANEOUS
  Administered 2024-08-31: 3 [IU] via SUBCUTANEOUS
  Administered 2024-09-01: 5 [IU] via SUBCUTANEOUS
  Administered 2024-09-02 – 2024-09-03 (×4): 3 [IU] via SUBCUTANEOUS

## 2024-08-15 MED ORDER — FREE WATER: 100.0000 mL | Freq: Three times a day (TID) | Status: DC

## 2024-08-15 MED ORDER — FREE WATER
100.0000 mL | Freq: Three times a day (TID) | Status: DC
Start: 2024-08-15 — End: 2024-09-03
  Administered 2024-08-15 – 2024-09-03 (×57): 100 mL

## 2024-08-15 MED ORDER — MENTHOL 3 MG MT LOZG: 1.0000 | LOZENGE | OROMUCOSAL | Status: DC | PRN

## 2024-08-15 MED ORDER — TRAMADOL HCL 50 MG PO TABS: 50.0000 mg | ORAL_TABLET | Freq: Two times a day (BID) | ORAL | Status: DC | PRN

## 2024-08-15 MED ORDER — ONDANSETRON HCL 4 MG/2ML IJ SOLN: 4.0000 mg | INTRAMUSCULAR | Status: DC | PRN

## 2024-08-15 MED ORDER — PANCRELIPASE (LIP-PROT-AMYL) 12000-38000 UNITS PO CPEP
24000.0000 [IU] | ORAL_CAPSULE | Freq: Three times a day (TID) | ORAL | Status: DC
  Administered 2024-08-16 – 2024-09-04 (×51): 24000 [IU] via ORAL
  Filled 2024-08-15 (×60): qty 2

## 2024-08-15 MED ORDER — ACETAMINOPHEN 325 MG PO TABS
325.0000 mg | ORAL_TABLET | ORAL | Status: DC | PRN
  Administered 2024-08-23 – 2024-08-25 (×4): 650 mg via ORAL
  Administered 2024-08-26: 325 mg via ORAL
  Administered 2024-08-26 – 2024-08-28 (×3): 650 mg via ORAL
  Administered 2024-08-29: 600 mg via ORAL
  Administered 2024-08-29 – 2024-09-02 (×7): 650 mg via ORAL
  Administered 2024-09-02: 500 mg via ORAL
  Filled 2024-08-15 (×13): qty 2
  Filled 2024-08-15: qty 1
  Filled 2024-08-15 (×3): qty 2

## 2024-08-15 MED ORDER — TRAMADOL HCL 50 MG PO TABS
50.0000 mg | ORAL_TABLET | Freq: Two times a day (BID) | ORAL | Status: DC | PRN
  Administered 2024-08-15 – 2024-08-21 (×6): 50 mg
  Filled 2024-08-15 (×9): qty 1

## 2024-08-15 MED ORDER — CLONIDINE HCL 0.1 MG PO TABS
0.1000 mg | ORAL_TABLET | Freq: Two times a day (BID) | ORAL | Status: DC
  Administered 2024-08-15 – 2024-09-04 (×40): 0.1 mg via ORAL
  Filled 2024-08-15 (×44): qty 1

## 2024-08-15 MED ORDER — METHOCARBAMOL 1000 MG/10ML IJ SOLN: 500.0000 mg | Freq: Three times a day (TID) | INTRAMUSCULAR | Status: DC

## 2024-08-15 MED ORDER — GABAPENTIN 250 MG/5ML PO SOLN: 100.0000 mg | Freq: Three times a day (TID) | ORAL | Status: DC

## 2024-08-15 MED ORDER — PROCHLORPERAZINE EDISYLATE 10 MG/2ML IJ SOLN
5.0000 mg | Freq: Four times a day (QID) | INTRAMUSCULAR | Status: DC | PRN
  Administered 2024-08-19 – 2024-09-03 (×8): 10 mg via INTRAVENOUS
  Filled 2024-08-15 (×8): qty 2

## 2024-08-15 MED ORDER — OSMOLITE 1.5 CAL PO LIQD
1260.0000 mL | ORAL | Status: DC
  Administered 2024-08-16 – 2024-08-17 (×2): 1260 mL

## 2024-08-15 MED ORDER — BANATROL TF EN LIQD: 60.0000 mL | Freq: Two times a day (BID) | ENTERAL | Status: DC

## 2024-08-15 MED ORDER — CARVEDILOL 6.25 MG PO TABS
6.2500 mg | ORAL_TABLET | Freq: Two times a day (BID) | ORAL | Status: DC
Start: 2024-08-16 — End: 2024-09-04
  Administered 2024-08-16 – 2024-09-04 (×39): 6.25 mg via ORAL
  Filled 2024-08-15 (×40): qty 1

## 2024-08-15 MED ORDER — AMLODIPINE BESYLATE 10 MG PO TABS
10.0000 mg | ORAL_TABLET | Freq: Every day | ORAL | Status: DC
  Administered 2024-08-16 – 2024-09-04 (×20): 10 mg via ORAL
  Filled 2024-08-15 (×20): qty 1

## 2024-08-15 MED ORDER — INSULIN ASPART 100 UNIT/ML IJ SOLN: 0.0000 [IU] | Freq: Three times a day (TID) | INTRAMUSCULAR | Status: DC

## 2024-08-15 MED ORDER — INSULIN ASPART 100 UNIT/ML IJ SOLN
6.0000 [IU] | Freq: Three times a day (TID) | INTRAMUSCULAR | Status: DC
  Administered 2024-08-16 – 2024-09-03 (×37): 6 [IU] via SUBCUTANEOUS

## 2024-08-15 MED ORDER — PROSOURCE TF20 ENFIT COMPATIBL EN LIQD
60.0000 mL | Freq: Every day | ENTERAL | Status: DC
  Administered 2024-08-16 – 2024-09-04 (×20): 60 mL
  Filled 2024-08-15 (×20): qty 60

## 2024-08-15 MED ORDER — HYDROCODONE-ACETAMINOPHEN 5-325 MG PO TABS: 1.0000 | ORAL_TABLET | Freq: Four times a day (QID) | ORAL | Status: DC | PRN

## 2024-08-15 MED ORDER — LIDOCAINE VISCOUS HCL 2 % MT SOLN: 15.0000 mL | OROMUCOSAL | Status: DC | PRN

## 2024-08-15 MED ORDER — FLUTICASONE PROPIONATE 50 MCG/ACT NA SUSP
2.0000 | Freq: Every day | NASAL | Status: DC
  Administered 2024-08-16 – 2024-09-03 (×18): 2 via NASAL
  Filled 2024-08-15: qty 16

## 2024-08-15 MED ORDER — ALUM & MAG HYDROXIDE-SIMETH 200-200-20 MG/5ML PO SUSP
30.0000 mL | ORAL | Status: DC | PRN
  Administered 2024-08-17 – 2024-08-24 (×2): 30 mL via ORAL
  Filled 2024-08-15 (×3): qty 30

## 2024-08-15 MED ORDER — PROCHLORPERAZINE MALEATE 5 MG PO TABS
5.0000 mg | ORAL_TABLET | Freq: Four times a day (QID) | ORAL | Status: DC | PRN
  Administered 2024-08-15: 5 mg via ORAL
  Administered 2024-08-18 – 2024-09-03 (×11): 10 mg via ORAL
  Filled 2024-08-15 (×6): qty 2
  Filled 2024-08-15: qty 1
  Filled 2024-08-15 (×5): qty 2

## 2024-08-15 MED ORDER — HYDRALAZINE HCL 25 MG PO TABS: 25.0000 mg | ORAL_TABLET | Freq: Three times a day (TID) | ORAL | Status: DC | PRN

## 2024-08-15 MED ORDER — INSULIN GLARGINE 100 UNIT/ML ~~LOC~~ SOLN: 20.0000 [IU] | Freq: Every day | SUBCUTANEOUS | Status: DC

## 2024-08-15 MED ORDER — PROCHLORPERAZINE 25 MG RE SUPP: 12.5000 mg | Freq: Four times a day (QID) | RECTAL | Status: DC | PRN

## 2024-08-15 MED ORDER — ORAL CARE MOUTH RINSE: 15.0000 mL | OROMUCOSAL | Status: DC | PRN

## 2024-08-15 MED ORDER — CARVEDILOL 6.25 MG PO TABS: 12.5000 mg | ORAL_TABLET | Freq: Two times a day (BID) | ORAL | Status: DC

## 2024-08-15 MED ORDER — INSULIN GLARGINE 100 UNIT/ML ~~LOC~~ SOLN
10.0000 [IU] | Freq: Two times a day (BID) | SUBCUTANEOUS | Status: DC
  Administered 2024-08-15 – 2024-08-19 (×9): 10 [IU] via SUBCUTANEOUS
  Filled 2024-08-15 (×11): qty 0.1

## 2024-08-15 MED ORDER — TRAZODONE HCL 50 MG PO TABS: 25.0000 mg | ORAL_TABLET | Freq: Every evening | ORAL | Status: DC | PRN

## 2024-08-15 MED ORDER — INSULIN ASPART 100 UNIT/ML IJ SOLN
0.0000 [IU] | Freq: Every day | INTRAMUSCULAR | Status: DC
  Administered 2024-08-16 – 2024-08-17 (×2): 2 [IU] via SUBCUTANEOUS
  Administered 2024-08-18 – 2024-08-22 (×4): 3 [IU] via SUBCUTANEOUS
  Administered 2024-08-23: 4 [IU] via SUBCUTANEOUS
  Administered 2024-08-24 – 2024-08-25 (×2): 3 [IU] via SUBCUTANEOUS
  Administered 2024-08-26 – 2024-08-27 (×2): 2 [IU] via SUBCUTANEOUS

## 2024-08-15 MED ORDER — AMLODIPINE BESYLATE 10 MG PO TABS: 10.0000 mg | ORAL_TABLET | Freq: Every day | ORAL | Status: DC

## 2024-08-15 MED ORDER — LOPERAMIDE HCL 2 MG PO CAPS
2.0000 mg | ORAL_CAPSULE | Freq: Three times a day (TID) | ORAL | Status: DC | PRN
  Administered 2024-08-20 – 2024-09-01 (×6): 2 mg via ORAL
  Filled 2024-08-15 (×6): qty 1

## 2024-08-15 MED ORDER — PHENOL 1.4 % MT LIQD: 1.0000 | OROMUCOSAL | Status: DC | PRN

## 2024-08-15 MED ORDER — PANTOPRAZOLE SODIUM 40 MG PO TBEC
40.0000 mg | DELAYED_RELEASE_TABLET | Freq: Two times a day (BID) | ORAL | Status: DC
  Administered 2024-08-15 – 2024-09-04 (×40): 40 mg via ORAL
  Filled 2024-08-15 (×7): qty 1
  Filled 2024-08-15: qty 2
  Filled 2024-08-15 (×21): qty 1
  Filled 2024-08-15: qty 2
  Filled 2024-08-15 (×8): qty 1
  Filled 2024-08-15: qty 2
  Filled 2024-08-15: qty 1

## 2024-08-15 MED ORDER — SIMETHICONE 80 MG PO CHEW
80.0000 mg | CHEWABLE_TABLET | Freq: Three times a day (TID) | ORAL | Status: DC
Start: 2024-08-15 — End: 2024-08-24
  Administered 2024-08-15 – 2024-08-23 (×21): 80 mg via ORAL
  Filled 2024-08-15 (×28): qty 1

## 2024-08-15 MED ORDER — PANTOPRAZOLE SODIUM 40 MG PO TBEC: 40.0000 mg | DELAYED_RELEASE_TABLET | Freq: Every day | ORAL | Status: DC

## 2024-08-15 NOTE — Plan of Care (Signed)
 Problem: Education: Goal: Knowledge of General Education information will improve Description: Including pain rating scale, medication(s)/side effects and non-pharmacologic comfort measures 08/15/2024 1900 by Robynn Avelina LABOR, RN Outcome: Adequate for Discharge 08/15/2024 1827 by Robynn Avelina LABOR, RN Outcome: Progressing   Problem: Clinical Measurements: Goal: Ability to maintain clinical measurements within normal limits will improve 08/15/2024 1900 by Robynn Avelina LABOR, RN Outcome: Adequate for Discharge 08/15/2024 1827 by Robynn Avelina LABOR, RN Outcome: Progressing Goal: Will remain free from infection 08/15/2024 1900 by Robynn Avelina LABOR, RN Outcome: Adequate for Discharge 08/15/2024 1827 by Robynn Avelina LABOR, RN Outcome: Progressing Goal: Diagnostic test results will improve 08/15/2024 1900 by Robynn Avelina LABOR, RN Outcome: Adequate for Discharge 08/15/2024 1827 by Robynn Avelina LABOR, RN Outcome: Progressing Goal: Respiratory complications will improve 08/15/2024 1900 by Robynn Avelina LABOR, RN Outcome: Adequate for Discharge 08/15/2024 1827 by Robynn Avelina LABOR, RN Outcome: Progressing Goal: Cardiovascular complication will be avoided 08/15/2024 1900 by Robynn Avelina LABOR, RN Outcome: Adequate for Discharge 08/15/2024 1827 by Robynn Avelina LABOR, RN Outcome: Progressing   Problem: Activity: Goal: Risk for activity intolerance will decrease 08/15/2024 1900 by Robynn Avelina LABOR, RN Outcome: Adequate for Discharge 08/15/2024 1827 by Robynn Avelina LABOR, RN Outcome: Progressing   Problem: Nutrition: Goal: Adequate nutrition will be maintained 08/15/2024 1900 by Robynn Avelina LABOR, RN Outcome: Adequate for Discharge 08/15/2024 1827 by Robynn Avelina LABOR, RN Outcome: Progressing   Problem: Coping: Goal: Level of anxiety will decrease 08/15/2024 1900 by Robynn Avelina LABOR, RN Outcome: Adequate for Discharge 08/15/2024 1827 by Robynn Avelina LABOR, RN Outcome: Progressing   Problem: Elimination: Goal: Will not experience complications related to bowel motility 08/15/2024 1900 by Robynn Avelina LABOR, RN Outcome: Adequate for Discharge 08/15/2024 1827 by Robynn Avelina LABOR, RN Outcome: Progressing Goal: Will not experience complications related to urinary retention 08/15/2024 1900 by Robynn Avelina LABOR, RN Outcome: Adequate for Discharge 08/15/2024 1827 by Robynn Avelina LABOR, RN Outcome: Progressing   Problem: Pain Managment: Goal: General experience of comfort will improve and/or be controlled 08/15/2024 1900 by Robynn Avelina LABOR, RN Outcome: Adequate for Discharge 08/15/2024 1827 by Robynn Avelina LABOR, RN Outcome: Progressing   Problem: Safety: Goal: Ability to remain free from injury will improve 08/15/2024 1900 by Robynn Avelina LABOR, RN Outcome: Adequate for Discharge 08/15/2024 1827 by Robynn Avelina LABOR, RN Outcome: Progressing   Problem: Skin Integrity: Goal: Risk for impaired skin integrity will decrease 08/15/2024 1900 by Robynn Avelina LABOR, RN Outcome: Adequate for Discharge 08/15/2024 1827 by Robynn Avelina LABOR, RN Outcome: Progressing   Problem: Education: Goal: Ability to describe self-care measures that may prevent or decrease complications (Diabetes Survival Skills Education) will improve 08/15/2024 1900 by Robynn Avelina LABOR, RN Outcome: Adequate for Discharge 08/15/2024 1827 by Robynn Avelina LABOR, RN Outcome: Progressing Goal: Individualized Educational Video(s) 08/15/2024 1900 by Robynn Avelina LABOR, RN Outcome: Adequate for Discharge 08/15/2024 1827 by Robynn Avelina LABOR, RN Outcome: Progressing   Problem: Coping: Goal: Ability to adjust to condition or change in health will improve 08/15/2024 1900 by Robynn Avelina LABOR, RN Outcome: Adequate for Discharge 08/15/2024 1827 by Robynn Avelina LABOR, RN Outcome: Progressing   Problem: Fluid Volume: Goal: Ability to  maintain a balanced intake and output will improve 08/15/2024 1900 by Robynn Avelina LABOR, RN Outcome: Adequate for Discharge 08/15/2024 1827 by Robynn Avelina LABOR, RN Outcome: Progressing   Problem: Health Behavior/Discharge Planning: Goal: Ability to identify and utilize available resources and services will improve 08/15/2024 1900 by Roderica Cathell, Avelina LABOR, RN Outcome:  Adequate for Discharge 08/15/2024 1827 by Robynn Avelina LABOR, RN Outcome: Progressing Goal: Ability to manage health-related needs will improve 08/15/2024 1900 by Robynn Avelina LABOR, RN Outcome: Adequate for Discharge 08/15/2024 1827 by Robynn Avelina LABOR, RN Outcome: Progressing   Problem: Metabolic: Goal: Ability to maintain appropriate glucose levels will improve 08/15/2024 1900 by Robynn Avelina LABOR, RN Outcome: Adequate for Discharge 08/15/2024 1827 by Robynn Avelina LABOR, RN Outcome: Progressing   Problem: Nutritional: Goal: Maintenance of adequate nutrition will improve 08/15/2024 1900 by Robynn Avelina LABOR, RN Outcome: Adequate for Discharge 08/15/2024 1827 by Robynn Avelina LABOR, RN Outcome: Progressing Goal: Progress toward achieving an optimal weight will improve 08/15/2024 1900 by Robynn Avelina LABOR, RN Outcome: Adequate for Discharge 08/15/2024 1827 by Robynn Avelina LABOR, RN Outcome: Progressing   Problem: Skin Integrity: Goal: Risk for impaired skin integrity will decrease 08/15/2024 1900 by Robynn Avelina LABOR, RN Outcome: Adequate for Discharge 08/15/2024 1827 by Robynn Avelina LABOR, RN Outcome: Progressing   Problem: Tissue Perfusion: Goal: Adequacy of tissue perfusion will improve 08/15/2024 1900 by Robynn Avelina LABOR, RN Outcome: Adequate for Discharge 08/15/2024 1827 by Robynn Avelina LABOR, RN Outcome: Progressing

## 2024-08-15 NOTE — H&P (Signed)
 Physical Medicine and Rehabilitation Admission H&P        Chief Complaint  Patient presents with   Functional deficits      HPI:  Lauren Gray is a 73 year old female with history of obesity, pre-diabetes, intermittent N/V for a month and was admitted on 07/04/24 for work up. She reported constipation as well as 10 lbs wt loss in recent months. She had been seen by Dr Rosalie in office a week PTA with plans for endoscopy. 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 She was started on TPN as well as clear liquids diet pending surgery. She continued to have issues with N/V and  NGT placed 08/29 with improvement in symptoms. Elevated BP treated with IV meds and insulin  added for hyperglycemia.    Sh underwent Whipple procedure with placement of pancreatic duct stent and jejunostomy tube on 09/09 by Dr. Aron. Post op course significant for fluid overload with rise in LFTs TB, pain control requiring dilaudid  PCA, recurrent N/V 09/14 requiring replacement of NG as well as   issues with confusion due to narcotics. Broad spectrum antibiotics added due to tachypnea with leucocytosis 09/18 due to pancreatic leak and PNA. CT A/P/chest negative for PE, showed small to moderate bilateral pleural effusions and large amount of inflammatory stranding and fluid seen in region of pancreatic head/body on imaging. .    She pulled out NGT on 09/17 and started on clears. Left pleural effusion tapped for 250 cc on 09/20. Has had murky/pruluent drainage from right [place] drain.    Diarrhea managed with addition of fiber and pancreatic enzymes. ABLA treated with 5 units total PRBC. Has completed 7 day course of Vanc, Zosyn  and Diflucan  on 09/27. Issues with blood pressure felt to be related to pain and anxiety and amlodipine  added for BP control. Acute on chronic renal failure resolved. Has been  transitioned to tube feeds. PT/OT has been working with patient who is showing improvement in activity tolerance but limited by fatigue and weakness. She has  has issues with dizziness, working on gaze stabilization techniques and requires min to CGA with mobility and min assist with ADLs.  She was independent PTA and CIR recommended due to functional decline.      Review of Systems  Constitutional:  Negative for fever.  Eyes:  Negative for blurred vision.  Respiratory:  Negative for shortness of breath.   Cardiovascular:  Negative for chest pain.  Gastrointestinal:  Positive for abdominal pain and constipation.  Skin:  Negative for rash.  Neurological:  Positive for weakness. Negative for dizziness.  Psychiatric/Behavioral:  The patient is nervous/anxious.            Past Medical History:  Diagnosis Date   Complication of anesthesia     Obesity     Scoliosis                 Past Surgical History:  Procedure Laterality Date   ABDOMINAL HYSTERECTOMY        TVH   BONE BIOPSY   07/06/2024    Procedure: BIOPSY, GI;  Surgeon: Elicia Claw, MD;  Location: [facility];  Service: Gastroenterology;;   ENTEROSCOPY N/A 07/06/2024    Procedure: ENTEROSCOPY;  Surgeon: Elicia Claw, MD;  Location: [facility];  Service: Gastroenterology;  Laterality: N/A;   JEJUNOSTOMY N/A 07/22/2024    Procedure: CREATION, JEJUNOSTOMY;  Surgeon:  Aron Shoulders, MD;  Location: [facility] OR;  Service: General;  Laterality: N/A;   PELVIC LAPAROSCOPY   04/06/2006    BSO   WHIPPLE PROCEDURE N/A 07/22/2024    Procedure: WHIPPLE PROCEDURE;  Surgeon: Aron Shoulders, MD;  Location: [facility] OR;  Service: General;  Laterality: N/A;               Family History  Problem Relation Age of Onset   Cancer Mother          ENDOMETRIOD OF OVARY   Diabetes Father     Heart disease Father     Breast cancer Sister 24   Aneurysm Sister     Diabetes Paternal Grandmother            Social History:  reports that she has  never smoked. She has never used smokeless tobacco. She reports that she does not currently use alcohol. She reports that she does not use drugs.     Allergies       Allergies  Allergen Reactions   Codeine Anxiety      i don't feel right at all   Latex Rash              Medications Prior to Admission  Medication Sig Dispense Refill   famotidine  (PEPCID ) 20 MG tablet Take 1 tablet (20 mg total) by mouth 2 (two) times daily as needed for heartburn or indigestion. 60 tablet 1   omeprazole (PRILOSEC) 40 MG capsule Take 40 mg by mouth daily. Half hour before morning meal       ondansetron  (ZOFRAN -ODT) 4 MG disintegrating tablet Take 1 tablet (4 mg total) by mouth every 8 (eight) hours as needed for nausea or vomiting. 20 tablet 0          Home: Home Living Family/patient expects to be discharged to:: Private residence Living Arrangements: Spouse/significant other (daughter Cassius) Available Help at Discharge: Family, Available 24 hours/day Type of Home: House Home Access: Level entry Home Layout: Multi-level Alternate Level Stairs-Number of Steps: 5 + 6 to get to main level Alternate Level Stairs-Rails: Can reach both Bathroom Shower/Tub: Engineer, manufacturing systems: Handicapped height Bathroom Accessibility: Yes Home Equipment: Agricultural consultant (2 wheels), [employer] - single point, Hand held shower head, Grab bars - tub/shower  Lives With: Spouse, Daughter   Functional History: Prior Function Prior Level of Function : Independent/Modified Independent, Working/employed, Driving Mobility Comments: no AD ADLs Comments: Indep with ADLs, IADLs, works in Therapist, sports Status:  Mobility: Bed Mobility Overal bed mobility: Needs Assistance Bed Mobility: Rolling, Sit to Sidelying Rolling: Contact guard assist Sidelying to sit: Contact guard assist, Used rails Supine to sit: Min assist Sit to supine: Mod assist Sit to sidelying: Min assist, Used rails General bed  mobility comments: OOB in chair Transfers Overall transfer level: Needs assistance Equipment used: Rolling walker (2 wheels) Transfers: Sit to/from Stand Sit to Stand: Min assist Bed to/from chair/wheelchair/BSC transfer type:: Step pivot Step pivot transfers: +2 safety/equipment, Contact guard assist General transfer comment: Cues for rocking to gain momentum, minA to power up to stand Ambulation/Gait Ambulation/Gait assistance: Contact guard assist Gait Distance (Feet): 15 Feet (+ 25) Assistive device: Rolling walker (2 wheels) Gait Pattern/deviations: Step-through pattern, Decreased stride length, Trunk flexed General Gait Details: Slow, but overall steady pace, cues for closer proximity to RW especially with turning, pt with some c/o dizziness in standing, discused and educated on gaze stabilization techniques when up ambulating Gait velocity: Decreased Gait velocity interpretation: <1.8  ft/sec, indicate of risk for recurrent falls Pre-gait activities: pt taking steps from EOB to recliner for ~ 4 ft with reciprocal gait pattern with short step length and mid foot initial contact. Moderate UE support on RW and CGA for safety   ADL: ADL Overall ADL's : Needs assistance/impaired Eating/Feeding: NPO Grooming: Set up, Sitting Upper Body Bathing: Moderate assistance Lower Body Bathing: Moderate assistance Upper Body Dressing : Moderate assistance Lower Body Dressing: Minimal assistance Lower Body Dressing Details (indicate cue type and reason): for R sock Toilet Transfer: Minimal assistance, Ambulation, Rolling walker (2 wheels) Toilet Transfer Details (indicate cue type and reason): Short distance simulated in room Toileting- Clothing Manipulation and Hygiene: Moderate assistance Functional mobility during ADLs: Contact guard assist, Minimal assistance, Rolling walker (2 wheels) General ADL Comments: Pt limited by fear and poor balance   Cognition: Cognition Orientation Level:  Oriented X4 Cognition Arousal: Alert Behavior During Therapy: Anxious     Blood pressure (!) 152/57, pulse 83, temperature 97.9 F (36.6 C), temperature source Oral, resp. rate 16, height 5' 8 (1.727 m), weight 114.8 kg, SpO2 98%.  Physical Exam  Constitution: Appropriate appearance for age. No apparent distress  +Obese Resp: No respiratory distress. No accessory muscle usage. on RA and CTAB Cardio: Well perfused appearance. Trace b/l peripheral edema. Abdomen: Distended, minimally tender diffusely, +hypoactive bowel sounds  Psych: Appropriate mood and affect. Skin:  Midline incision covered in clean dressing Drain sites c/d/i      Neuro: AAOx4. No apparent cognitive deficits   DTRs: Reflexes were 2+ in bilateral achilles, patella, biceps, BR and triceps. Babinsky: flexor responses b/l.   Hoffmans: negative b/l Sensory exam: revealed normal sensation in all dermatomal regions in bilateral upper extremities and bilateral lower extremities Motor exam: strength 5-/5 throughout bilateral upper extremities and bilateral lower extremities Coordination: Fine motor coordination was normal.        Lab Results Last 48 Hours        Results for orders placed or performed during the hospital encounter of 07/04/24 (from the past 48 hours)  Glucose, capillary     Status: Abnormal    Collection Time: 08/13/24  6:01 PM  Result Value Ref Range    Glucose-Capillary 122 (H) 70 - 99 mg/dL      Comment: Glucose reference range applies only to samples taken after fasting for at least 8 hours.  Glucose, capillary     Status: Abnormal    Collection Time: 08/13/24  9:51 PM  Result Value Ref Range    Glucose-Capillary 187 (H) 70 - 99 mg/dL      Comment: Glucose reference range applies only to samples taken after fasting for at least 8 hours.  CBC     Status: Abnormal    Collection Time: 08/14/24  3:47 AM  Result Value Ref Range    WBC 10.0 4.0 - 10.5 K/uL    RBC 3.31 (L) 3.87 - 5.11 MIL/uL     Hemoglobin 9.4 (L) 12.0 - 15.0 g/dL    HCT 68.8 (L) 63.9 - 46.0 %    MCV 94.0 80.0 - 100.0 fL    MCH 28.4 26.0 - 34.0 pg    MCHC 30.2 30.0 - 36.0 g/dL    RDW 84.6 88.4 - 84.4 %    Platelets 448 (H) 150 - 400 K/uL    nRBC 0.0 0.0 - 0.2 %      Comment: Performed at [facility], [address], [place], [place] [address]  Basic metabolic  panel with GFR     Status: Abnormal    Collection Time: 08/14/24  3:47 AM  Result Value Ref Range    Sodium 140 135 - 145 mmol/L    Potassium 4.4 3.5 - 5.1 mmol/L    Chloride 109 98 - 111 mmol/L    CO2 21 (L) 22 - 32 mmol/L    Glucose, Bld 259 (H) 70 - 99 mg/dL      Comment: Glucose reference range applies only to samples taken after fasting for at least 8 hours.    BUN 20 8 - 23 mg/dL    Creatinine, Ser 9.00 0.44 - 1.00 mg/dL    Calcium  8.2 (L) 8.9 - 10.3 mg/dL    GFR, Estimated >39 >39 mL/min      Comment: (NOTE) Calculated using the CKD-EPI Creatinine Equation (2021)      Anion gap 10 5 - 15      Comment: Performed at [facility], [address] [place], [place] [address]  Hepatic function panel     Status: Abnormal    Collection Time: 08/14/24  3:47 AM  Result Value Ref Range    Total Protein 6.9 6.5 - 8.1 g/dL    Albumin  1.7 (L) 3.5 - 5.0 g/dL    AST 18 15 - 41 U/L    ALT 21 0 - 44 U/L    Alkaline Phosphatase 117 38 - 126 U/L    Total Bilirubin 0.7 0.0 - 1.2 mg/dL    Bilirubin, Direct 0.2 0.0 - 0.2 mg/dL    Indirect Bilirubin 0.5 0.3 - 0.9 mg/dL      Comment: Performed at [facility], [address] [place], [place] [address]  Glucose, capillary     Status: Abnormal    Collection Time: 08/14/24  7:48 AM  Result Value Ref Range    Glucose-Capillary 267 (H) 70 - 99 mg/dL      Comment: Glucose reference range applies only to samples taken after fasting for at least 8 hours.  Glucose, capillary     Status: Abnormal    Collection Time: 08/14/24 11:53 AM  Result Value Ref Range    Glucose-Capillary 133 (H) 70 - 99  mg/dL      Comment: Glucose reference range applies only to samples taken after fasting for at least 8 hours.  Glucose, capillary     Status: Abnormal    Collection Time: 08/14/24  5:07 PM  Result Value Ref Range    Glucose-Capillary 143 (H) 70 - 99 mg/dL      Comment: Glucose reference range applies only to samples taken after fasting for at least 8 hours.  Glucose, capillary     Status: Abnormal    Collection Time: 08/14/24  8:56 PM  Result Value Ref Range    Glucose-Capillary 147 (H) 70 - 99 mg/dL      Comment: Glucose reference range applies only to samples taken after fasting for at least 8 hours.  Glucose, capillary     Status: Abnormal    Collection Time: 08/15/24  8:01 AM  Result Value Ref Range    Glucose-Capillary 235 (H) 70 - 99 mg/dL      Comment: Glucose reference range applies only to samples taken after fasting for at least 8 hours.    Comment 1 Notify RN    Glucose, capillary     Status: Abnormal    Collection Time: 08/15/24 12:13 PM  Result Value Ref Range    Glucose-Capillary 183 (  H) 70 - 99 mg/dL      Comment: Glucose reference range applies only to samples taken after fasting for at least 8 hours.    Comment 1 Notify RN        Imaging Results (Last 48 hours)  No results found.         Blood pressure (!) 152/57, pulse 83, temperature 97.9 F (36.6 C), temperature source Oral, resp. rate 16, height 5' 8 (1.727 m), weight 114.8 kg, SpO2 98%.   Medical Problem List and Plan: 1. 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  Antithrombotics: -DVT/anticoagulation:  Pharmaceutical: Lovenox              -antiplatelet therapy: N/A 3. Pain Management: Tylenol  prn. Oxycodone  d/c 9/28. Hydrocodone prn.   4. Mood/Behavior/Sleep: LCSW to follow for evaluation and support.               -antipsychotic agents: N/A 5. Neuropsych/cognition: This patient is capable of making decisions on her own behalf. 6. Skin/Wound Care: Monitor wound for healing.  7. Fluids/Electrolytes/Nutrition: Monitor I/O. Continue tube feeds 8. Duodenal adenocarcinoma w/gastric outlet obst:  continues on tube feed for nutritional support.  --Advanced to soft diet 10/01 and nausea since yesterday.              --completed 10-13 day course antibiotics for multifocal PNA/pancreatic leak.   9. Abdominal distension:  No BM today and reports discomfort getting worse since lunch.               --KUB ordered - nonobstructive bowel gas -- Will change imodium  to prn as diarrhea has resolved--family feels it's not needed.              --Simethicone added.   10. Fluid overload/Pleural effusions: In part due to third spacing. S/p thoracocentesis.  --Weight 241 at admission-->271-->253 today. Continue daily wts.  --Encourage pulmonary hygiene  11. HTN: Monitor BP TID--on amlodipine , catapres  and coreg   - Add hydralazine  25 mg Q8H PRN for HTN >170 SBP or >100 DBP, given recent highs  12.  Prediabetes: Hgb A1c- 6.1. Elevated BS due to tube feeds             --continue Lantus  with meal coverage and SSI             --monitor BS ac/hs and use SSI for elevated BS  13. Acute on chronic kidney disease: SCr 1.3 at admission and now WNL 14.  Obesity  Class 2: BMI 38.47 15.  ABLA: Recheck CBC in am. Hgb 8-9 range.     Sharlet GORMAN Schmitz, PA-C 08/15/2024  I have examined the patient independently and edited the note for HPI, ROS, exam, assessment, and plan as appropriate. I am in agreement with the above recommendations.   Joesph JAYSON Likes, DO 08/15/2024

## 2024-08-15 NOTE — Progress Notes (Signed)
 24 Days Post-Op  Subjective: CC: Patient denies any abdominal pain. Tolerating soft diet and tf's without n/v. Didn't eat much yesterday. BM yesterday. Voiding. Working with therapies.   Objective: Vital signs in last 24 hours: Temp:  [98.5 F (36.9 C)-99.1 F (37.3 C)] 98.9 F (37.2 C) (10/03 0727) Pulse Rate:  [90-92] 92 (10/03 0727) Resp:  [16] 16 (10/03 0727) BP: (152-173)/(61-77) 173/77 (10/03 0727) SpO2:  [95 %-97 %] 95 % (10/03 0727) Weight:  [114.8 kg] 114.8 kg (10/03 0500) Last BM Date : 08/13/24  Intake/Output from previous day: 10/02 0701 - 10/03 0700 In: 760 [P.O.:460; NG/GT:300] Out: 140 [Drains:140] Intake/Output this shift: No intake/output data recorded.  PE: Gen:  Alert, NAD, pleasant Abd: Soft, mild distension, mild epigastric and luq ttp. Midline incision with inferior aspect w/ skin open w/ healthy granulation tissue at the base - clean and without drainage. Remainder of midline w/ steri-strips in place, cdi.  J-tube with TF's running, site cdi. Drain 1 with milky purulent drainage. Drain 2 with cloudy serous drainage. 140cc/24 hours from drains.   Lab Results:  Recent Labs    08/14/24 0347  WBC 10.0  HGB 9.4*  HCT 31.1*  PLT 448*   BMET Recent Labs    08/14/24 0347  NA 140  K 4.4  CL 109  CO2 21*  GLUCOSE 259*  BUN 20  CREATININE 0.99  CALCIUM  8.2*   PT/INR No results for input(s): LABPROT, INR in the last 72 hours. CMP     Component Value Date/Time   NA 140 08/14/2024 0347   NA 142 06/18/2024 1445   K 4.4 08/14/2024 0347   CL 109 08/14/2024 0347   CO2 21 (L) 08/14/2024 0347   GLUCOSE 259 (H) 08/14/2024 0347   BUN 20 08/14/2024 0347   BUN 14 06/18/2024 1445   CREATININE 0.99 08/14/2024 0347   CREATININE 1.12 (H) 12/27/2021 0846   CALCIUM  8.2 (L) 08/14/2024 0347   PROT 6.9 08/14/2024 0347   PROT 7.2 06/18/2024 1445   ALBUMIN  1.7 (L) 08/14/2024 0347   ALBUMIN  4.3 06/18/2024 1445   AST 18 08/14/2024 0347   ALT 21  08/14/2024 0347   ALKPHOS 117 08/14/2024 0347   BILITOT 0.7 08/14/2024 0347   BILITOT 0.4 06/18/2024 1445   GFRNONAA >60 08/14/2024 0347   Lipase     Component Value Date/Time   LIPASE 34 07/06/2024 0441    Studies/Results: No results found.  Anti-infectives: Anti-infectives (From admission, onward)    Start     Dose/Rate Route Frequency Ordered Stop   08/07/24 0000  vancomycin  (VANCOCIN ) IVPB 1000 mg/200 mL premix  Status:  Discontinued        1,000 mg 200 mL/hr over 60 Minutes Intravenous  Once 08/06/24 1106 08/06/24 1346   08/06/24 2300  vancomycin  (VANCOCIN ) IVPB 1000 mg/200 mL premix        1,000 mg 200 mL/hr over 60 Minutes Intravenous Every 12 hours 08/06/24 1346 08/09/24 2321   08/06/24 1200  vancomycin  (VANCOCIN ) IVPB 1000 mg/200 mL premix  Status:  Discontinued        1,000 mg 200 mL/hr over 60 Minutes Intravenous  Once 08/06/24 1105 08/06/24 1106   08/02/24 1400  piperacillin -tazobactam (ZOSYN ) IVPB 3.375 g        3.375 g 12.5 mL/hr over 240 Minutes Intravenous Every 8 hours 08/01/24 2238 08/10/24 0219   08/01/24 2330  cefTRIAXone  (ROCEPHIN ) injection 1 g       Placed in And Linked Group  1 g Intramuscular  Once 08/01/24 2238 08/02/24 0015   08/01/24 1800  vancomycin  (VANCOCIN ) IVPB 1000 mg/200 mL premix  Status:  Discontinued        1,000 mg 200 mL/hr over 60 Minutes Intravenous Every 12 hours 08/01/24 1642 08/06/24 1346   08/01/24 1047  vancomycin  variable dose per unstable renal function (pharmacist dosing)  Status:  Discontinued         Does not apply See admin instructions 08/01/24 1047 08/01/24 1646   07/30/24 2300  vancomycin  (VANCOCIN ) IVPB 1000 mg/200 mL premix  Status:  Discontinued        1,000 mg 200 mL/hr over 60 Minutes Intravenous Every 12 hours 07/30/24 1029 08/01/24 1047   07/30/24 1400  piperacillin -tazobactam (ZOSYN ) IVPB 3.375 g  Status:  Discontinued        3.375 g 12.5 mL/hr over 240 Minutes Intravenous Every 8 hours 07/30/24 1225  08/01/24 2238   07/30/24 1115  fluconazole  (DIFLUCAN ) IVPB 400 mg        400 mg 100 mL/hr over 120 Minutes Intravenous Every 24 hours 07/30/24 1025 08/09/24 1221   07/30/24 1100  vancomycin  (VANCOREADY) IVPB 2000 mg/400 mL        2,000 mg 200 mL/hr over 120 Minutes Intravenous  Once 07/30/24 1010 07/30/24 1540   07/28/24 0945  piperacillin -tazobactam (ZOSYN ) IVPB 3.375 g  Status:  Discontinued        3.375 g 12.5 mL/hr over 240 Minutes Intravenous Every 8 hours 07/28/24 0853 07/30/24 1131   07/23/24 0000  ceFAZolin  (ANCEF ) IVPB 2g/100 mL premix        2 g 200 mL/hr over 30 Minutes Intravenous Every 8 hours 07/22/24 2314 07/23/24 0058   07/22/24 0900  ceFAZolin  (ANCEF ) IVPB 2g/100 mL premix        2 g 200 mL/hr over 30 Minutes Intravenous To [facility] 07/21/24 1938 07/22/24 1432        Assessment/Plan POD 24 s/p Whipple procedure creation, jejunostomy by Dr. Aron 07/22/2024.  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  - Cont soft diet, shakes and 18 hour TF's. Continue current rate and duration of TF's to aid in healing till PO calorie intake improves.  - Completed abx 9/27. No current indication for abx. Monitor.  - On fiber and imodium . Continue and monitor BM's - Continue pain regimen. - Continue PT/OT. Recommending CIR - Cont drains.  - Will continue to follow. Discussed w/ TRH   FEN: Soft, IVF per primary  VTE: SCDs, SQH ID: Vanc/zosyn /diflucan  discontinued 9/27. WBCs wnl on last check. Afebrile.      Dispo: working toward rehab.     LOS: 42 days    Ozell CHRISTELLA Shaper, [facility] Surgery 08/15/2024, 7:58 AM Please see Amion for pager number during day hours 7:00am-4:30pm

## 2024-08-15 NOTE — Progress Notes (Signed)
 OT Cancellation Note  Patient Details Name: Lauren Gray MRN: [id number] DOB: Jun 04, 1951   Cancelled Treatment:    Reason Eval/Treat Not Completed: Other (comment).  Attempted skilled OT treatment x2.  First attempt pt. Politely requesting I check back after lunch.  2nd attempt pt. Had out of state visitors that had just recently arrived.  Pt. Agreeable to skilled OT treatment session tomorrow morning.  Will check back with skilled therapy efforts tomorrow as schedule allows.    CHRISTELLA Nest Lorraine-COTA/L  08/15/2024, 2:14 PM

## 2024-08-15 NOTE — Plan of Care (Signed)
  Problem: Education: Goal: Knowledge of General Education information will improve Description: Including pain rating scale, medication(s)/side effects and non-pharmacologic comfort measures Outcome: Progressing   Problem: Clinical Measurements: Goal: Ability to maintain clinical measurements within normal limits will improve Outcome: Progressing Goal: Will remain free from infection Outcome: Progressing Goal: Diagnostic test results will improve Outcome: Progressing Goal: Respiratory complications will improve Outcome: Progressing Goal: Cardiovascular complication will be avoided Outcome: Progressing   Problem: Activity: Goal: Risk for activity intolerance will decrease Outcome: Progressing   Problem: Nutrition: Goal: Adequate nutrition will be maintained Outcome: Progressing   Problem: Coping: Goal: Level of anxiety will decrease Outcome: Progressing   Problem: Elimination: Goal: Will not experience complications related to bowel motility Outcome: Progressing Goal: Will not experience complications related to urinary retention Outcome: Progressing   Problem: Pain Managment: Goal: General experience of comfort will improve and/or be controlled Outcome: Progressing   Problem: Safety: Goal: Ability to remain free from injury will improve Outcome: Progressing   Problem: Skin Integrity: Goal: Risk for impaired skin integrity will decrease Outcome: Progressing   Problem: Education: Goal: Ability to describe self-care measures that may prevent or decrease complications (Diabetes Survival Skills Education) will improve Outcome: Progressing Goal: Individualized Educational Video(s) Outcome: Progressing   Problem: Coping: Goal: Ability to adjust to condition or change in health will improve Outcome: Progressing   Problem: Fluid Volume: Goal: Ability to maintain a balanced intake and output will improve Outcome: Progressing   Problem: Health Behavior/Discharge  Planning: Goal: Ability to identify and utilize available resources and services will improve Outcome: Progressing Goal: Ability to manage health-related needs will improve Outcome: Progressing   Problem: Metabolic: Goal: Ability to maintain appropriate glucose levels will improve Outcome: Progressing   Problem: Nutritional: Goal: Maintenance of adequate nutrition will improve Outcome: Progressing Goal: Progress toward achieving an optimal weight will improve Outcome: Progressing   Problem: Skin Integrity: Goal: Risk for impaired skin integrity will decrease Outcome: Progressing   Problem: Tissue Perfusion: Goal: Adequacy of tissue perfusion will improve Outcome: Progressing

## 2024-08-15 NOTE — H&P (Incomplete)
 Physical Medicine and Rehabilitation Admission H&P    Chief Complaint  Patient presents with   Functional deficits    HPI:  Lauren Gray is a 72 year old female with history of obesity, pre-diabetes, intermittent N/V for a month and was admitted on 07/04/24 for work up. She reported constipation as well as 10 lbs wt loss in recent months. She had been seen by Dr Rosalie in office a week PTA with plans for endoscopy. 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 She was started on TPN as well as clear liquids diet pending surgery. She continued to have issues with N/V and  NGT placed 08/29 with improvement in symptoms. Elevated BP treated with IV meds and insulin  added for hyperglycemia.   Sh underwent Whipple procedure with placement of pancreatic duct stent and jejunostomy tube on 09/09 by Dr. Aron. Post op course significant for fluid overload with rise in LFTs TB, pain control requiring dilaudid  PCA, recurrent N/V 09/14 requiring replacement of NG as well as   issues with confusion due to narcotics. Broad spectrum antibiotics added due to tachypnea with leucocytosis 09/18 due to pancreatic leak and PNA. CT A/P/chest negative for PE, showed small to moderate bilateral pleural effusions and large amount of inflammatory stranding and fluid seen in region of pancreatic head/body on imaging. .   She pulled out NGT on 09/17 and started on clears. Left pleural effusion tapped for 250 cc on 09/20. Has had murky/pruluent drainage from right [place] drain.    Diarrhea managed with addition of fiber and pancreatic enzymes. ABLA treated with 5 units total PRBC. Has completed 7 day course of Vanc, Zosyn  and Diflucan  on 09/27. Issues with blood pressure felt to be related to pain and anxiety and amlodipine  added for BP control. Acute on chronic renal failure resolved. Has been transitioned to tube  feeds. PT/OT has been working with patient who is showing improvement in activity tolerance but limited by fatigue and weakness. She has  has issues with dizziness, working on gaze stabilization techniques and requires min to CGA with mobility and min assist with ADLs.  She was independent PTA and CIR recommended due to functional decline.    Review of Systems  Constitutional:  Negative for fever.  Eyes:  Negative for blurred vision.  Respiratory:  Negative for shortness of breath.   Cardiovascular:  Negative for chest pain.  Gastrointestinal:  Positive for abdominal pain and constipation.  Skin:  Negative for rash.  Neurological:  Positive for weakness. Negative for dizziness.  Psychiatric/Behavioral:  The patient is nervous/anxious.      Past Medical History:  Diagnosis Date   Complication of anesthesia    Obesity    Scoliosis     Past Surgical History:  Procedure Laterality Date   ABDOMINAL HYSTERECTOMY     TVH   BONE BIOPSY  07/06/2024   Procedure: BIOPSY, GI;  Surgeon: Elicia Claw, MD;  Location: [facility];  Service: Gastroenterology;;   ENTEROSCOPY N/A 07/06/2024   Procedure: ENTEROSCOPY;  Surgeon: Elicia Claw, MD;  Location: [facility];  Service: Gastroenterology;  Laterality: N/A;   JEJUNOSTOMY N/A 07/22/2024   Procedure: CREATION, JEJUNOSTOMY;  Surgeon: Aron Shoulders, MD;  Location: [facility] OR;  Service: General;  Laterality: N/A;   PELVIC LAPAROSCOPY  04/06/2006   BSO   WHIPPLE PROCEDURE N/A 07/22/2024   Procedure: WHIPPLE PROCEDURE;  Surgeon: Aron Shoulders, MD;  Location:  [facility] OR;  Service: General;  Laterality: N/A;    Family History  Problem Relation Age of Onset   Cancer Mother        ENDOMETRIOD OF OVARY   Diabetes Father    Heart disease Father    Breast cancer Sister 52   Aneurysm Sister    Diabetes Paternal Grandmother     Social History:  reports that she has never smoked. She has never used smokeless tobacco. She reports that she does not  currently use alcohol. She reports that she does not use drugs.    Allergies  Allergen Reactions   Codeine Anxiety    i don't feel right at all   Latex Rash    Medications Prior to Admission  Medication Sig Dispense Refill   famotidine  (PEPCID ) 20 MG tablet Take 1 tablet (20 mg total) by mouth 2 (two) times daily as needed for heartburn or indigestion. 60 tablet 1   omeprazole (PRILOSEC) 40 MG capsule Take 40 mg by mouth daily. Half hour before morning meal     ondansetron  (ZOFRAN -ODT) 4 MG disintegrating tablet Take 1 tablet (4 mg total) by mouth every 8 (eight) hours as needed for nausea or vomiting. 20 tablet 0    Home: Home Living Family/patient expects to be discharged to:: Private residence Living Arrangements: Spouse/significant other (daughter Cassius) Available Help at Discharge: Family, Available 24 hours/day Type of Home: House Home Access: Level entry Home Layout: Multi-level Alternate Level Stairs-Number of Steps: 5 + 6 to get to main level Alternate Level Stairs-Rails: Can reach both Bathroom Shower/Tub: Engineer, manufacturing systems: Handicapped height Bathroom Accessibility: Yes Home Equipment: Agricultural consultant (2 wheels), [employer] - single point, Hand held shower head, Grab bars - tub/shower  Lives With: Spouse, Daughter   Functional History: Prior Function Prior Level of Function : Independent/Modified Independent, Working/employed, Driving Mobility Comments: no AD ADLs Comments: Indep with ADLs, IADLs, works in Magazine features editor Status:  Mobility: Bed Mobility Overal bed mobility: Needs Assistance Bed Mobility: Rolling, Sit to Sidelying Rolling: Contact guard assist Sidelying to sit: Contact guard assist, Used rails Supine to sit: Min assist Sit to supine: Mod assist Sit to sidelying: Min assist, Used rails General bed mobility comments: OOB in chair Transfers Overall transfer level: Needs assistance Equipment used: Rolling walker (2  wheels) Transfers: Sit to/from Stand Sit to Stand: Min assist Bed to/from chair/wheelchair/BSC transfer type:: Step pivot Step pivot transfers: +2 safety/equipment, Contact guard assist General transfer comment: Cues for rocking to gain momentum, minA to power up to stand Ambulation/Gait Ambulation/Gait assistance: Contact guard assist Gait Distance (Feet): 15 Feet (+ 25) Assistive device: Rolling walker (2 wheels) Gait Pattern/deviations: Step-through pattern, Decreased stride length, Trunk flexed General Gait Details: Slow, but overall steady pace, cues for closer proximity to RW especially with turning, pt with some c/o dizziness in standing, discused and educated on gaze stabilization techniques when up ambulating Gait velocity: Decreased Gait velocity interpretation: <1.8 ft/sec, indicate of risk for recurrent falls Pre-gait activities: pt taking steps from EOB to recliner for ~ 4 ft with reciprocal gait pattern with short step length and mid foot initial contact. Moderate UE support on RW and CGA for safety    ADL: ADL Overall ADL's : Needs assistance/impaired Eating/Feeding: NPO Grooming: Set up, Sitting Upper Body Bathing: Moderate assistance Lower Body Bathing: Moderate assistance Upper Body Dressing : Moderate assistance Lower Body Dressing: Minimal assistance Lower Body Dressing Details (indicate cue type and reason): for R sock Toilet Transfer: Minimal assistance,  Ambulation, Rolling walker (2 wheels) Toilet Transfer Details (indicate cue type and reason): Short distance simulated in room Toileting- Clothing Manipulation and Hygiene: Moderate assistance Functional mobility during ADLs: Contact guard assist, Minimal assistance, Rolling walker (2 wheels) General ADL Comments: Pt limited by fear and poor balance  Cognition: Cognition Orientation Level: Oriented X4 Cognition Arousal: Alert Behavior During Therapy: Anxious   Blood pressure (!) 152/57, pulse 83,  temperature 97.9 F (36.6 C), temperature source Oral, resp. rate 16, height 5' 8 (1.727 m), weight 114.8 kg, SpO2 98%. Physical Exam Vitals and nursing note reviewed.  Constitutional:      Appearance: Normal appearance.  Abdominal:     General: There is distension.  Neurological:     Mental Status: She is alert and oriented to person, place, and time.     Results for orders placed or performed during the hospital encounter of 07/04/24 (from the past 48 hours)  Glucose, capillary     Status: Abnormal   Collection Time: 08/13/24  6:01 PM  Result Value Ref Range   Glucose-Capillary 122 (H) 70 - 99 mg/dL    Comment: Glucose reference range applies only to samples taken after fasting for at least 8 hours.  Glucose, capillary     Status: Abnormal   Collection Time: 08/13/24  9:51 PM  Result Value Ref Range   Glucose-Capillary 187 (H) 70 - 99 mg/dL    Comment: Glucose reference range applies only to samples taken after fasting for at least 8 hours.  CBC     Status: Abnormal   Collection Time: 08/14/24  3:47 AM  Result Value Ref Range   WBC 10.0 4.0 - 10.5 K/uL   RBC 3.31 (L) 3.87 - 5.11 MIL/uL   Hemoglobin 9.4 (L) 12.0 - 15.0 g/dL   HCT 68.8 (L) 63.9 - 53.9 %   MCV 94.0 80.0 - 100.0 fL   MCH 28.4 26.0 - 34.0 pg   MCHC 30.2 30.0 - 36.0 g/dL   RDW 84.6 88.4 - 84.4 %   Platelets 448 (H) 150 - 400 K/uL   nRBC 0.0 0.0 - 0.2 %    Comment: Performed at [facility], [address], [place], [place] [address]  Basic metabolic panel with GFR     Status: Abnormal   Collection Time: 08/14/24  3:47 AM  Result Value Ref Range   Sodium 140 135 - 145 mmol/L   Potassium 4.4 3.5 - 5.1 mmol/L   Chloride 109 98 - 111 mmol/L   CO2 21 (L) 22 - 32 mmol/L   Glucose, Bld 259 (H) 70 - 99 mg/dL    Comment: Glucose reference range applies only to samples taken after fasting for at least 8 hours.   BUN 20 8 - 23 mg/dL   Creatinine, Ser 9.00 0.44 - 1.00 mg/dL   Calcium  8.2 (L) 8.9 - 10.3 mg/dL    GFR, Estimated >39 >39 mL/min    Comment: (NOTE) Calculated using the CKD-EPI Creatinine Equation (2021)    Anion gap 10 5 - 15    Comment: Performed at [facility], [address], [place], [place] [address]  Hepatic function panel     Status: Abnormal   Collection Time: 08/14/24  3:47 AM  Result Value Ref Range   Total Protein 6.9 6.5 - 8.1 g/dL   Albumin  1.7 (L) 3.5 - 5.0 g/dL   AST 18 15 - 41 U/L   ALT 21 0 - 44 U/L   Alkaline Phosphatase  117 38 - 126 U/L   Total Bilirubin 0.7 0.0 - 1.2 mg/dL   Bilirubin, Direct 0.2 0.0 - 0.2 mg/dL   Indirect Bilirubin 0.5 0.3 - 0.9 mg/dL    Comment: Performed at [facility], [address] [place], [place] [address]  Glucose, capillary     Status: Abnormal   Collection Time: 08/14/24  7:48 AM  Result Value Ref Range   Glucose-Capillary 267 (H) 70 - 99 mg/dL    Comment: Glucose reference range applies only to samples taken after fasting for at least 8 hours.  Glucose, capillary     Status: Abnormal   Collection Time: 08/14/24 11:53 AM  Result Value Ref Range   Glucose-Capillary 133 (H) 70 - 99 mg/dL    Comment: Glucose reference range applies only to samples taken after fasting for at least 8 hours.  Glucose, capillary     Status: Abnormal   Collection Time: 08/14/24  5:07 PM  Result Value Ref Range   Glucose-Capillary 143 (H) 70 - 99 mg/dL    Comment: Glucose reference range applies only to samples taken after fasting for at least 8 hours.  Glucose, capillary     Status: Abnormal   Collection Time: 08/14/24  8:56 PM  Result Value Ref Range   Glucose-Capillary 147 (H) 70 - 99 mg/dL    Comment: Glucose reference range applies only to samples taken after fasting for at least 8 hours.  Glucose, capillary     Status: Abnormal   Collection Time: 08/15/24  8:01 AM  Result Value Ref Range   Glucose-Capillary 235 (H) 70 - 99 mg/dL    Comment: Glucose reference range applies only to samples taken after fasting for at least 8  hours.   Comment 1 Notify RN   Glucose, capillary     Status: Abnormal   Collection Time: 08/15/24 12:13 PM  Result Value Ref Range   Glucose-Capillary 183 (H) 70 - 99 mg/dL    Comment: Glucose reference range applies only to samples taken after fasting for at least 8 hours.   Comment 1 Notify RN    No results found.    Blood pressure (!) 152/57, pulse 83, temperature 97.9 F (36.6 C), temperature source Oral, resp. rate 16, height 5' 8 (1.727 m), weight 114.8 kg, SpO2 98%.  Medical Problem List and Plan: 1. Functional deficits secondary to ***  -patient may *** shower  -ELOS/Goals: *** 2.  Antithrombotics: -DVT/anticoagulation:  Pharmaceutical: Lovenox   -antiplatelet therapy: N/A 3. Pain Management: Tylenol  prn. Oxycodone  d/c 9/28. Hydrocodone prn.   4. Mood/Behavior/Sleep: LCSW to follow for evaluation and support.   -antipsychotic agents: N/A 5. Neuropsych/cognition: This patient is capable of making decisions on her own behalf. 6. Skin/Wound Care: Monitor wound for healing.  7. Fluids/Electrolytes/Nutrition: Monitor I/O. Continue tube feeds 8. Duodenal adenocarcinoma w/gastric outlet obst:  continues on tube feed for nutritional support.  --Advanced to soft diet 10/01 and nausea since yesterday.   --completed 10-13 day course antibiotics for multifocal PNA/pancreatic leak.  9. Abdominal distension:  No BM today and reports discomfort getting worse since lunch.    --KUB ordered.  Will change imodium  to prn as diarrhea has resolved--family feels it's not needed.   --Simethicone added.  10. Fluid overload/Pleural effusions: In part due to third spacing. S/p thoracocentesis.  --Weight 241 at admission-->271-->253 today. Continue daily wts.  --Encourage pulmonary hygiene 11. HTN: Monitor BP TID--on amlodipine , catapres  and coreg 12.  Prediabetes: Hgb A1c- 6.1. Elevated BS due to  tube feeds  --continue Lantus  with meal coverage and SSI  --monitor BS ac/hs and use SSI for  elevated BS 13. Acute on chronic kidney disease: SCr 1.3 at admission and now WNL 14.  Obesity  Class 2: BMI 38.47 15.  ABLA: Recheck CBC in am. Hgb 8-9 range.      ***  Sharlet GORMAN Schmitz, PA-C 08/15/2024

## 2024-08-15 NOTE — Progress Notes (Signed)
 Patient admitted about 2030. Family at bedside. Rehab notebook given and discussed therapy schedule.Patient up to [facility] for 1 continent void.

## 2024-08-15 NOTE — Discharge Summary (Signed)
 Physician Discharge Summary   Patient: Lauren Gray MRN: [id number] DOB: 13-Sep-1951  Admit date:     07/04/2024  Discharge date: 08/15/24  Discharge Physician: Lonni SHAUNNA Dalton   PCP: Juanice Thomes SAUNDERS, FNP     Recommendations at discharge:  Discharge to inpatient rehab Monitor output from drains daily Drain management and wound care per Dr. Aron Follow up with Dr. Aron as directed after discharge from rehab Continue tube feeds and wean as appropriate as oral intake improves Follow up with PCP for new hypertension Wean insulin  as appropriate as tube feeds weaned       Hospital Course: 73 y.o. 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  Underwent SB enteroscopy on 8/24 that showed biopsy positive for adenocarcinoma.  Started on TPN, underwent Whipple procedure 9/9 by Dr. Aron.  Ongoing drainage post-op, continued antibiotics until 9/27.  No fever since then, drainage stable   Bilateral pleural effusion - Multifocal pneumonia - resolved  CT chest noted evidence of bilateral pleural effusion likely third spacing in the setting of hypoalbuminemia - underwent thoracentesis yielding 250 mL fluid > exudative based on LDH - cytology negative for malignant cells - has completed abx course for potential PNA -oxygen saturation is 97% on room air   Acute post-op blood loss anemia superimposed on chronic normocytic anemia  Transfused 5u PRBC total this admission.  Hgb stable in last week, no clinically observed bleeding.    Essential hypertension No BP meds noted prior to admission, here, started on amlodipine , Coreg.  Recommend titration as outpatient.     Prediabetes with hyperglycemia A1c 6.1.  Started on Lantus  and SSI here in setting of tube feeds.      AKI on CKD stage 2 Baseline creatinine normal - admission serum creatinine was 1.35 - creatinine has normalized with support   Obesity Class 3 Body mass index is 40.56 kg/m.   Hypernatremia Resolved with free water  expansion   Transaminitis Has resolved with supportive care          The [place]  Controlled Substances Registry was reviewed for this patient prior to discharge.  Consultants: Gastroenterology, General Surgery Procedures performed: Small bowel enteroscopy Whipple   Disposition: Inpatient rehab Diet recommendation:  Discharge Diet Orders (From admission, onward)     Start     Ordered   08/15/24 0000  Diet - low sodium heart healthy        08/15/24 1603             DISCHARGE MEDICATION: Allergies as of 08/15/2024       Reactions   Codeine Anxiety   i don't feel right at all   Latex Rash        Medication List     STOP taking these medications    famotidine  20 MG tablet Commonly known as: PEPCID    omeprazole 40 MG capsule Commonly known as: PRILOSEC   ondansetron  4 MG disintegrating tablet Commonly known as: ZOFRAN -ODT       TAKE these medications    amLODipine  10 MG tablet Commonly known as: NORVASC  Take 1 tablet (10 mg total) by mouth daily. Start taking on: August 16, 2024   carvedilol 6.25 MG tablet  Commonly known as: COREG Take 2 tablets (12.5 mg total) by mouth 2 (two) times daily with a meal.   feeding supplement (OSMOLITE 1.5 CAL) Liqd Place 1,260 mLs into feeding tube daily. Start taking on: August 16, 2024   feeding supplement (PROSource TF20) liquid Place 60 mLs into feeding tube daily. Start taking on: August 16, 2024   fiber supplement (BANATROL TF) liquid Place 60 mLs into feeding tube 2 (two) times daily.   free water  Soln Place 100 mLs into feeding tube every 8 (eight) hours.    gabapentin  250 MG/5ML solution Commonly known as: NEURONTIN  Place 2 mLs (100 mg total) into feeding tube every 8 (eight) hours.   HYDROcodone-acetaminophen  5-325 MG tablet Commonly known as: NORCO/VICODIN Take 1-2 tablets by mouth every 6 (six) hours as needed for severe pain (pain score 7-10).   insulin  aspart 100 UNIT/ML injection Commonly known as: novoLOG  Inject 0-15 Units into the skin 3 (three) times daily with meals.   insulin  glargine 100 UNIT/ML injection Commonly known as: LANTUS  Inject 0.2 mLs (20 Units total) into the skin daily.   methocarbamol  1000 MG/10ML injection Commonly known as: ROBAXIN  Inject 5 mLs (500 mg total) into the vein every 8 (eight) hours.   morphine  (PF) 2 MG/ML injection Inject 1 mL (2 mg total) into the vein every 2 (two) hours as needed (Intractable pain not controlled by oral medication).   ondansetron  4 MG/2ML Soln injection Commonly known as: ZOFRAN  Inject 2 mLs (4 mg total) into the vein every 4 (four) hours as needed for nausea or vomiting.   Pancrelipase  (Lip-Prot-Amyl) 24000-76000 units Cpep Take 1 capsule (24,000 Units total) by mouth 3 (three) times daily before meals.   pantoprazole  40 MG tablet Commonly known as: PROTONIX  Take 1 tablet (40 mg total) by mouth daily.   sodium chloride  0.9 % SOLN 50 mL with promethazine  25 MG/ML SOLN 12.5 mg Inject 12.5 mg into the vein every 6 (six) hours as needed.   traMADol  50 MG tablet Commonly known as: ULTRAM  Place 1 tablet (50 mg total) into feeding tube every 12 (twelve) hours as needed for moderate pain (pain score 4-6).   traZODone  50 MG tablet Commonly known as: DESYREL  Place 1 tablet (50 mg total) into feeding tube at bedtime.               Discharge Care Instructions  (From admission, onward)           Start     Ordered   08/15/24 0000  Discharge wound care:       Comments: Per General Surgery   08/15/24 1603            Follow-up Information     Aron Shoulders, MD Follow up.   Specialty: General Surgery Contact information: [address] [place] [address] [place] [address] [phone]                 Discharge Instructions     Ambulatory referral to Hematology / Oncology   Complete by: As directed    Dr. Lanny or Dr. Cloretta for duodenal adenocarcinoma   Diet - low sodium heart healthy   Complete by: As directed    Discharge wound care:   Complete by: As directed    Per General Surgery       Discharge Exam: Filed Weights   08/11/24 0500 08/13/24 0500 08/15/24 0500  Weight: 121.1 kg 121.2 kg 114.8 kg    General: Pt is alert, awake, not  in acute distress, lying in bed, interactive Cardiovascular: RRR, nl S1-S2, no murmurs appreciated.   Mild LE edema.   Respiratory: Normal respiratory rate and rhythm.  CTAB without rales or wheezes. Abdominal: Abdomen soft and with mild diffuse tenderness.  No distension or HSM.   Neuro/Psych: Strength symmetric in upper and lower extremities.  Judgment and insight appear normal.   Condition at discharge: fair  The results of significant diagnostics from this hospitalization (including imaging, microbiology, ancillary and laboratory) are listed below for reference.   Imaging Studies: US  THORACENTESIS ASP PLEURAL SPACE W/IMG GUIDE Result Date: 08/02/2024 INDICATION: 73 year old female with right pleural effusion. Request for diagnostic thoracentesis. EXAM: ULTRASOUND GUIDED RIGHT THORACENTESIS MEDICATIONS: 10 mL 1% lidocaine  COMPLICATIONS: None immediate. PROCEDURE: An ultrasound guided thoracentesis was thoroughly discussed with the patient and questions answered. The benefits, risks, alternatives and complications were also discussed. The patient understands and wishes to proceed with the procedure. Written consent was obtained. Ultrasound was performed to localize and mark an adequate pocket of fluid in the right chest. The area was then prepped and draped in the normal sterile  fashion. 1% Lidocaine  was used for local anesthesia. Under ultrasound guidance a 6 Fr Safe-T-Centesis catheter was introduced. Thoracentesis was performed. The catheter was removed and a dressing applied. FINDINGS: A total of approximately 250 mL of amber fluid was removed. Samples were sent to the laboratory as requested by the clinical team. IMPRESSION: Successful ultrasound guided right thoracentesis yielding 250 mL of pleural fluid. Performed by: Kacie Matthews PA-C Electronically Signed   By: Cordella Banner   On: 08/02/2024 15:28   DG Chest 1 View Result Date: 08/02/2024 CLINICAL DATA:  Pleural effusion.  Status post thoracentesis. EXAM: CHEST  1 VIEW COMPARISON:  08/01/2024 FINDINGS: Low lung volumes. The cardio pericardial silhouette is enlarged. Similar left base collapse/consolidation with small bilateral pleural effusions evident. No pneumothorax. Right PICC line tip overlies the mid SVC level. Telemetry leads overlie the chest. IMPRESSION: Low volume film with left base collapse/consolidation and small bilateral pleural effusions. No pneumothorax. Electronically Signed   By: Camellia Candle M.D.   On: 08/02/2024 11:44   DG CHEST PORT 1 VIEW Result Date: 08/01/2024 EXAM: 1 VIEW XRAY OF THE CHEST 08/01/2024 05:48:33 AM COMPARISON: 07/30/2024 radiograph and CT CLINICAL HISTORY: Pneumonia FINDINGS: LINES, TUBES AND DEVICES: Stable right PICC line. Enteric tube removed. LUNGS AND PLEURA: Low lung volumes. Increased left basilar opacity. Unchanged right mid lung opacity. Decreased right pleural effusion. Left pleural effusion on CT is not well demonstrated by radiograph. HEART AND MEDIASTINUM: Unchanged heart size and mediastinal contours. BONES AND SOFT TISSUES: No acute osseous abnormality. Midline abdominal skin staples. IMPRESSION: 1. Increased left basilar opacity. Left pleural effusion on CT is not well demonstrated by radiograph. 2. Unchanged right mid lung opacity. Decreased right pleural  effusion. Electronically signed by: Andrea Gasman MD 08/01/2024 01:24 PM EDT RP Workstation: [id number]   CT CHEST ABDOMEN PELVIS W CONTRAST Result Date: 07/30/2024 CLINICAL DATA:  Sepsis. EXAM: CT CHEST, ABDOMEN, AND PELVIS WITH CONTRAST TECHNIQUE: Multidetector CT imaging of the chest, abdomen and pelvis was performed following the standard protocol during bolus administration of intravenous contrast. RADIATION DOSE REDUCTION: This exam was performed according to the departmental dose-optimization program which includes automated exposure control, adjustment of the mA and/or kV according to patient size and/or use of iterative reconstruction technique. CONTRAST:  75mL OMNIPAQUE  IOHEXOL  350 MG/ML SOLN COMPARISON:  July 04, 2024. FINDINGS: CT CHEST FINDINGS Cardiovascular: No significant vascular findings.  Normal heart size. No pericardial effusion. Mediastinum/Nodes: No enlarged mediastinal, hilar, or axillary lymph nodes. Thyroid gland, trachea, and esophagus demonstrate no significant findings. Lungs/Pleura: Small to moderate size bilateral pleural effusions are noted with adjacent subsegmental atelectasis. No pneumothorax is noted. Patchy airspace opacities are noted anteriorly in left upper lobe concerning for multifocal pneumonia. Musculoskeletal: No chest wall mass or suspicious bone lesions identified. CT ABDOMEN PELVIS FINDINGS Hepatobiliary: Gallbladder is not visualized and presumably has been removed. No abnormal bowel dilatation. Left hepatic cyst is again noted and unchanged. Pancreas: Large amount of inflammatory stranding and fluid is seen in the region of the pancreatic head and body consistent with history of recent Whipple's procedure. Pancreatic tail is unremarkable. Two surgical drains are seen entering right upper quadrant with tips in or near surgical bed. Spleen: Normal in size without focal abnormality. Adrenals/Urinary Tract: Adrenal glands and kidneys appear normal. No  hydronephrosis or renal obstruction is noted. Urinary bladder is decompressed. Stomach/Bowel: Nasogastric tube tip is seen within the gastric lumen. There is no evidence of bowel obstruction or inflammation. The appendix is not visualized. Jejunostomy tube is seen entering left lower quadrant. Vascular/Lymphatic: Aortic atherosclerosis. No enlarged abdominal or pelvic lymph nodes. Reproductive: Status post hysterectomy. No adnexal masses. Other: No ascites or hernia is noted. Musculoskeletal: No acute or significant osseous findings. IMPRESSION: 1. Small to moderate size bilateral pleural effusions are noted with adjacent subsegmental atelectasis. 2. Patchy airspace opacities are noted anteriorly in left upper lobe concerning for multifocal pneumonia. 3. Large amount of inflammatory stranding and fluid is seen in the region of the pancreatic head and body consistent with history of recent Whipple's procedure. Two surgical drains are seen entering right upper quadrant with tips in or near surgical bed. 4. Aortic atherosclerosis. Aortic Atherosclerosis (ICD10-I70.0). Electronically Signed   By: Lynwood Landy Raddle M.D.   On: 07/30/2024 12:46   DG CHEST PORT 1 VIEW Result Date: 07/30/2024 EXAM: 1 VIEW XRAY OF THE CHEST 07/30/2024 11:05:00 AM COMPARISON: 07/27/2024 CLINICAL HISTORY: Hypoxia 200808. Reason for exam: hypoxia FINDINGS: LINES, TUBES AND DEVICES: Enteric tube in place with tip and side port overlying the stomach. Right PICC in place with tip overlying the expected region of the distal superior vena cava. LUNGS AND PLEURA: Low lung volumes. Bibasilar atelectasis. Mild diffuse interstitial prominence. Slightly increased right mid and lower lung opacities. HEART AND MEDIASTINUM: No acute abnormality of the cardiac and mediastinal silhouettes. BONES AND SOFT TISSUES: No acute osseous abnormality. IMPRESSION: 1. Low lung volumes with bibasilar atelectasis and mild diffuse interstitial prominence. 2. Slightly  increased right mid and lower lung opacities. Electronically signed by: Donnice Mania MD 07/30/2024 12:42 PM EDT RP Workstation: [id number]   DG CHEST PORT 1 VIEW Result Date: 07/27/2024 CLINICAL DATA:  Vomiting EXAM: PORTABLE CHEST 1 VIEW COMPARISON:  07/24/2024 FINDINGS: Right PICC line tip: SVC.  Nasogastric tube tip in the stomach body. Low lung volumes are present, causing crowding of the pulmonary vasculature. Bandlike density at the right lung base potentially from atelectasis or scarring. Central retrocardiac density compatible with small hiatal hernia. No blunting of the costophrenic angles. Heart size within normal limits. No significant skeletal abnormality observed. IMPRESSION: 1. Low lung volumes are present, causing crowding of the pulmonary vasculature. 2. Bandlike density at the right lung base potentially from atelectasis or scarring. 3. Small hiatal hernia. 4. Nasogastric tube tip in the stomach body. Electronically Signed   By: Ryan Salvage M.D.   On: 07/27/2024 17:43   DG Abd 1  View Result Date: 07/27/2024 CLINICAL DATA:  Nasogastric tube placement EXAM: ABDOMEN - 1 VIEW COMPARISON:  07/10/2024 FINDINGS: Nasogastric tube tip and side port are in the stomach body. Midline skin staples in the upper abdomen. Contrast medium noted in the colon. IMPRESSION: 1. Nasogastric tube tip and side port are in the stomach body. Electronically Signed   By: Ryan Salvage M.D.   On: 07/27/2024 17:41   DG CHEST PORT 1 VIEW Result Date: 07/24/2024 CLINICAL DATA:  73 year old female with chest pain. Postoperative day 2 pancreaticoduodenectomy for obstructing duodenal cancer. EXAM: PORTABLE CHEST 1 VIEW COMPARISON:  Chest radiographs 04/04/2006. FINDINGS: Portable AP semi upright view at 0607 hours. Right PICC line is in place and terminates at the carina, SVC level. Enteric tube is in place and continues into the left upper quadrant, tip not included. Partially visible abdominal skin staples and  other dense left upper quadrant object of unclear (arrows). Low lung volumes. Mediastinal contours within normal limits. Visualized tracheal air column is within normal limits. No pneumothorax. Patchy perihilar opacity, confluent on the left and in the left lung base also. No pulmonary edema. Trace fluid in the right minor fissure. Small pleural effusions difficult to exclude. No definite dilated bowel loops in the visible upper abdomen. No acute osseous abnormality identified. IMPRESSION: 1. Recent postoperative changes in the abdomen. Right PICC line terminates at the SVC level. Enteric tube courses into the left abdomen, tip not included. 2. Low lung volumes with left greater than right perihilar and lung base opacity which is probably atelectasis but infection or aspiration not excluded. Electronically Signed   By: VEAR Hurst M.D.   On: 07/24/2024 06:29    Microbiology: Results for orders placed or performed during the hospital encounter of 07/04/24  MRSA Next Gen by PCR, Nasal     Status: None   Collection Time: 07/22/24  7:09 PM   Specimen: Nasal Mucosa; Nasal Swab  Result Value Ref Range Status   MRSA by PCR Next Gen NOT DETECTED NOT DETECTED Final    Comment: (NOTE) The GeneXpert MRSA Assay (FDA approved for NASAL specimens only), is one component of a comprehensive MRSA colonization surveillance program. It is not intended to diagnose MRSA infection nor to guide or monitor treatment for MRSA infections. Test performance is not FDA approved in patients less than 70 years old. Performed at [facility], [address], [place], [place] [address]   Body fluid culture w Gram Stain     Status: None   Collection Time: 08/02/24 10:57 AM   Specimen: Lung, Right; Pleural Fluid  Result Value Ref Range Status   Specimen Description PLEURAL  Final   Special Requests Lung, Right  Final   Gram Stain   Final    RARE WBC PRESENT,BOTH PMN AND MONONUCLEAR NO ORGANISMS SEEN    Culture   Final     NO GROWTH 3 DAYS Performed at [facility], [address], [place], [place] [address]    Report Status 08/05/2024 FINAL  Final    Labs: CBC: Recent Labs  Lab 08/09/24 0430 08/10/24 1321 08/14/24 0347  WBC 7.4 9.4 10.0  HGB 8.9* 10.4* 9.4*  HCT 28.7* 33.6* 31.1*  MCV 93.2 92.8 94.0  PLT 496* 611* 448*   Basic Metabolic Panel: Recent Labs  Lab 08/09/24 0430 08/10/24 1321 08/12/24 0052 08/14/24 0347  NA 134* 136 139 140  K 4.1 4.4 4.0 4.4  CL 107 106 110 109  CO2 20* 19*  20* 21*  GLUCOSE 220* 249* 292* 259*  BUN 10 13 18 20   CREATININE 0.88 0.97 0.95 0.99  CALCIUM  7.6* 8.0* 7.9* 8.2*  MG  --   --  1.8  --   PHOS  --   --  2.4*  --    Liver Function Tests: Recent Labs  Lab 08/10/24 1321 08/12/24 0052 08/14/24 0347  AST 16 13* 18  ALT 25 19 21   ALKPHOS 100 83 117  BILITOT 0.6 0.6 0.7  PROT 7.2 6.3* 6.9  ALBUMIN  1.9* 1.6* 1.7*   CBG: Recent Labs  Lab 08/14/24 1153 08/14/24 1707 08/14/24 2056 08/15/24 0801 08/15/24 1213  GLUCAP 133* 143* 147* 235* 183*    Discharge time spent: approximately 45 minutes spent on discharge counseling, evaluation of patient on day of discharge, and coordination of discharge planning with nursing, social work, pharmacy and case management  Signed: Lonni SHAUNNA Dalton, MD [facility] 08/15/2024

## 2024-08-15 NOTE — Plan of Care (Signed)
  Problem: Clinical Measurements: Goal: Will remain free from infection Outcome: Progressing   Problem: Activity: Goal: Risk for activity intolerance will decrease Outcome: Progressing   Problem: Nutrition: Goal: Adequate nutrition will be maintained Outcome: Progressing   Problem: Coping: Goal: Level of anxiety will decrease Outcome: Progressing   

## 2024-08-15 NOTE — Progress Notes (Signed)
   Inpatient Rehabilitation Admissions Coordinator   I have insurance approval and CIR bed to admit her to today. I met with patient and family at bedside and they are in agreement. Acute team and TOC made aware. I will make the arrangements.  Heron Leavell, RN, MSN Rehab Admissions Coordinator [phone] 08/15/2024 3:55 PM

## 2024-08-16 DIAGNOSIS — R5381 Other malaise: Secondary | ICD-10-CM | POA: Diagnosis not present

## 2024-08-16 LAB — COMPREHENSIVE METABOLIC PANEL WITH GFR
ALT: 17 U/L (ref 0–44)
AST: 13 U/L — ABNORMAL LOW (ref 15–41)
Albumin: 1.5 g/dL — ABNORMAL LOW (ref 3.5–5.0)
Alkaline Phosphatase: 98 U/L (ref 38–126)
Anion gap: 10 (ref 5–15)
BUN: 20 mg/dL (ref 8–23)
CO2: 22 mmol/L (ref 22–32)
Calcium: 8.1 mg/dL — ABNORMAL LOW (ref 8.9–10.3)
Chloride: 109 mmol/L (ref 98–111)
Creatinine, Ser: 1.13 mg/dL — ABNORMAL HIGH (ref 0.44–1.00)
GFR, Estimated: 51 mL/min — ABNORMAL LOW (ref >60)
Glucose, Bld: 288 mg/dL — ABNORMAL HIGH (ref 70–99)
Potassium: 4.2 mmol/L (ref 3.5–5.1)
Sodium: 141 mmol/L (ref 135–145)
Total Bilirubin: 0.5 mg/dL (ref 0.0–1.2)
Total Protein: 6.7 g/dL (ref 6.5–8.1)

## 2024-08-16 LAB — CBC WITH DIFFERENTIAL/PLATELET
Basophils Absolute: 0 K/uL (ref 0.0–0.1)
Basophils Relative: 0 %
Eosinophils Absolute: 0 K/uL (ref 0.0–0.5)
Eosinophils Relative: 0 %
HCT: 27.7 % — ABNORMAL LOW (ref 36.0–46.0)
Hemoglobin: 8.5 g/dL — ABNORMAL LOW (ref 12.0–15.0)
Lymphocytes Relative: 4 %
Lymphs Abs: 0.3 K/uL — ABNORMAL LOW (ref 0.7–4.0)
MCH: 28.3 pg (ref 26.0–34.0)
MCHC: 30.7 g/dL (ref 30.0–36.0)
MCV: 92.3 fL (ref 80.0–100.0)
Monocytes Absolute: 0.2 K/uL (ref 0.1–1.0)
Monocytes Relative: 3 %
Neutro Abs: 7.2 K/uL (ref 1.7–7.7)
Neutrophils Relative %: 93 %
Platelets: 345 K/uL (ref 150–400)
RBC: 3 MIL/uL — ABNORMAL LOW (ref 3.87–5.11)
RDW: 15.3 % (ref 11.5–15.5)
WBC: 7.7 K/uL (ref 4.0–10.5)
nRBC: 0 % (ref 0.0–0.2)

## 2024-08-16 LAB — GLUCOSE, CAPILLARY
Glucose-Capillary: 283 mg/dL — ABNORMAL HIGH (ref 70–99)
Glucose-Capillary: 304 mg/dL — ABNORMAL HIGH (ref 70–99)
Glucose-Capillary: 163 mg/dL — ABNORMAL HIGH (ref 70–99)
Glucose-Capillary: 143 mg/dL — ABNORMAL HIGH (ref 70–99)
Glucose-Capillary: 206 mg/dL — ABNORMAL HIGH (ref 70–99)

## 2024-08-16 MED ORDER — ORAL CARE MOUTH RINSE
15.0000 mL | OROMUCOSAL | Status: DC
  Administered 2024-08-16 – 2024-09-03 (×61): 15 mL via OROMUCOSAL

## 2024-08-16 MED ORDER — CHLORHEXIDINE GLUCONATE CLOTH 2 % EX PADS
6.0000 | MEDICATED_PAD | Freq: Two times a day (BID) | CUTANEOUS | Status: DC
  Administered 2024-08-16 – 2024-08-23 (×16): 6 via TOPICAL

## 2024-08-16 MED ORDER — ORAL CARE MOUTH RINSE: 15.0000 mL | OROMUCOSAL | Status: DC | PRN

## 2024-08-16 NOTE — Progress Notes (Signed)
 Inpatient Rehabilitation Admission Medication Review by a Pharmacist  A complete drug regimen review was completed for this patient to identify any potential clinically significant medication issues.  High Risk Drug Classes Is patient taking? Indication by Medication  Antipsychotic Yes, as an intravenous medication Compazine , Promethazine - N/V  Anticoagulant Yes Enoxaparin  -DVT prophylaxis  Antibiotic No   Opioid Yes Hydocodone/APAP, tramadol  -Pain  Antiplatelet No   Hypoglycemics/insulin  Yes Novolog , Lantus - Hyperglycemia  Vasoactive Medication Yes Amlodipine , carvedilol, clonidine , hydralazine  -HTN  Chemotherapy No   Other Yes fleet enema  and glycerin  - constipation Maalox- indigestion Pantoprazole - reflux  Diphenhydramine - itching  Acetaminophen - pain  Robitussin- cough  Robaxin - muscle spasms  gabapentin - neuropathy  simethicone- gas   trazodone  and -insomnia Osmolite, prosource, banatrol - supplement Creon - pancreatic enzymes Lidocaine  viscous, menthol , phenol - oral pain Loperamide  - diarrhea     Type of Medication Issue Identified Description of Issue Recommendation(s)  Drug Interaction(s) (clinically significant)     Duplicate Therapy     Allergy     No Medication Administration End Date     Incorrect Dose     Additional Drug Therapy Needed     Significant med changes from prior encounter (inform family/care partners about these prior to discharge).  Communicate relevant medication changes to patient/family members at discharge from CIR.   Restart or discontinue PTA meds not resumed in CIR at discharge if clinically indicated.   Other       Clinically significant medication issues were identified that warrant physician communication and completion of prescribed/recommended actions by midnight of the next day:  No  Name of provider notified for urgent issues identified:   Provider Method of Notification:     Pharmacist comments:   Time spent  performing this drug regimen review (minutes):  15  Larraine Brazier, PharmD Clinical Pharmacist 08/16/2024  11:43 AM **Pharmacist phone directory can now be found on [email or web address] ([email or web address]).  Listed under [facility].

## 2024-08-16 NOTE — Evaluation (Addendum)
 Occupational Therapy Assessment and Plan  Patient Details  Name: Lauren Gray MRN: [id number] Date of Birth: [birth year]  OT Diagnosis: muscle weakness (generalized) Rehab Potential: Rehab Potential (ACUTE ONLY): Good ELOS: 12-14 days   Today's Date: 08/16/2024 OT Individual Time: 0100-0215 OT Individual Time Calculation (min): 75 min     Hospital Problem: Principal Problem:   Debility   Past Medical History:  Past Medical History:  Diagnosis Date   Complication of anesthesia    Obesity    Scoliosis    Past Surgical History:  Past Surgical History:  Procedure Laterality Date   ABDOMINAL HYSTERECTOMY     TVH   BONE BIOPSY  07/06/2024   Procedure: BIOPSY, GI;  Surgeon: Elicia Claw, MD;  Location: [facility];  Service: Gastroenterology;;   ENTEROSCOPY N/A 07/06/2024   Procedure: ENTEROSCOPY;  Surgeon: Elicia Claw, MD;  Location: [facility];  Service: Gastroenterology;  Laterality: N/A;   JEJUNOSTOMY N/A 07/22/2024   Procedure: CREATION, JEJUNOSTOMY;  Surgeon: Aron Shoulders, MD;  Location: [facility] OR;  Service: General;  Laterality: N/A;   PELVIC LAPAROSCOPY  04/06/2006   BSO   WHIPPLE PROCEDURE N/A 07/22/2024   Procedure: WHIPPLE PROCEDURE;  Surgeon: Aron Shoulders, MD;  Location: [facility] OR;  Service: General;  Laterality: N/A;    Assessment & Plan Clinical Impression: Patient is a Lauren Gray is a 73 year old female with history of obesity, pre-diabetes, intermittent N/V for a month and was admitted on 07/04/24 for work up. She reported constipation as well as 10 lbs wt loss in recent months. She had been seen by Dr Rosalie in office a week PTA with plans for endoscopy. 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 She was started on TPN as well as clear liquids diet pending surgery. She continued to have issues with N/V and  NGT placed 08/29 with improvement in  symptoms. Elevated BP treated with IV meds and insulin  added for hyperglycemia.    Sh underwent Whipple procedure with placement of pancreatic duct stent and jejunostomy tube on 09/09 by Dr. Aron. Post op course significant for fluid overload with rise in LFTs TB, pain control requiring dilaudid  PCA, recurrent N/V 09/14 requiring replacement of NG as well as   issues with confusion due to narcotics. Broad spectrum antibiotics added due to tachypnea with leucocytosis 09/18 due to pancreatic leak and PNA. CT A/P/chest negative for PE, showed small to moderate bilateral pleural effusions and large amount of inflammatory stranding and fluid seen in region of pancreatic head/body on imaging. .    She pulled out NGT on 09/17 and started on clears. Left pleural effusion tapped for 250 cc on 09/20. Has had murky/pruluent drainage from right [place] drain.    Diarrhea managed with addition of fiber and pancreatic enzymes. ABLA treated with 5 units total PRBC. Has completed 7 day course of Vanc, Zosyn  and Diflucan  on 09/27. Issues with blood pressure felt to be related to pain and anxiety and amlodipine  added for BP control. Acute on chronic renal failure resolved. Has been transitioned to tube feeds. PT/OT has been working with patient who is showing improvement in activity tolerance but limited by fatigue and weakness. She has  has issues with dizziness, working on gaze stabilization techniques and requires min to CGA with mobility and min assist with ADLs.  She was independent PTA and CIR recommended due to functional decline.   Patient transferred to  [facility] on 08/15/2024 .    Patient currently requires mod to [facility] with basic self-care skills secondary to muscle weakness.  Prior to hospitalization, patient could complete BADL with independent .  Patient will benefit from skilled intervention to decrease level of assist with basic self-care skills prior to discharge home with care partner.  Anticipate patient will  require 24 hour supervision and TBD.  OT - End of Session Activity Tolerance: Tolerates 10 - 20 min activity with multiple rests Endurance Deficit: Yes Endurance Deficit Description: high fatigue during transfers, and when comeing from sit to standing , limits pt from amb for distance. OT Assessment Rehab Potential (ACUTE ONLY): Good OT Patient demonstrates impairments in the following area(s): Balance;Endurance;Pain OT Basic ADL's Functional Problem(s): Bathing;Dressing;Toileting OT Advanced ADL's Functional Problem(s): Simple Meal Preparation;Light Housekeeping OT Transfers Functional Problem(s): Tub/Shower OT Plan OT Intensity: Minimum of 1-2 x/day, 45 to 90 minutes OT Frequency: 5 out of 7 days OT Duration/Estimated Length of Stay: 12-14 days OT Treatment/Interventions: Balance/vestibular training;Self Care/advanced ADL retraining;Therapeutic Exercise;UE/LE Strength taining/ROM;Patient/family education;DME/adaptive equipment instruction;UE/LE Coordination activities;Functional mobility training;Therapeutic Activities OT Self Feeding Anticipated Outcome(s): ModI OT Basic Self-Care Anticipated Outcome(s): Modi OT Toileting Anticipated Outcome(s): ModI OT Bathroom Transfers Anticipated Outcome(s): ModI OT Recommendation Recommendations for Other Services: None Patient destination: Home Follow Up Recommendations: Other (comment) (TBD) Equipment Recommended: To be determined   OT Evaluation Precautions/Restrictions  Precautions Precautions: Fall;Other (comment) Precaution/Restrictions Comments: JP drainx2 (L), PEG tube Restrictions Weight Bearing Restrictions Per Provider Order: No General Chart Reviewed: Yes Family/Caregiver Present: Yes Vital Signs Therapy Vitals Temp: 98.8 F (37.1 C) Temp Source: Oral Pulse Rate: 81 BP: (!) 146/58 Patient Position (if appropriate): Lying Oxygen Therapy SpO2: 97 % O2 Device: Room Air Pain Pain Assessment Pain Score: 4  Pain Type:  Acute pain Pain Location: Abdomen Pain Orientation: Mid;Left Pain Descriptors / Indicators: Sharp Pain Onset: With Activity Multiple Pain Sites: No Home Living/Prior Functioning Home Living Family/patient expects to be discharged to:: Private residence Living Arrangements: Spouse/significant other Available Help at Discharge: Family, Available 24 hours/day Type of Home: House Home Access: Level entry, Other (comment) Home Layout: Multi-level Alternate Level Stairs-Number of Steps: 5 + 6 to get to main level Alternate Level Stairs-Rails: Left Bathroom Shower/Tub: Engineer, manufacturing systems: Handicapped height Bathroom Accessibility: Yes  Lives With: Spouse, Daughter IADL History Homemaking Responsibilities: Yes Meal Prep Responsibility: Careers adviser Responsibility: Primary Cleaning Responsibility: Primary Current License: Yes Occupation: Full time employment Type of Occupation: Runner, broadcasting/film/video Leisure and Hobbies: Working with kides Prior Function Level of Independence: Independent with gait, Independent with transfers  Able to Take Stairs?: Yes Driving: Yes Vision Baseline Vision/History: 1 Wears glasses Ability to See in Adequate Light: 0 Adequate Patient Visual Report: No change from baseline Vision Assessment?: No apparent visual deficits Perception  Perception: Within Functional Limits (some reported dizziness on occassion) Praxis   Cognition Cognition Overall Cognitive Status: Within Functional Limits for tasks assessed Arousal/Alertness: Awake/alert Orientation Level: Person;Situation;Place Person: Oriented Place: Oriented Situation: Oriented Memory: Appears intact Awareness: Appears intact Problem Solving: Appears intact Safety/Judgment: Appears intact Brief Interview for Mental Status (BIMS) Repetition of Three Words (First Attempt): 3 Temporal Orientation: Year: Correct Temporal Orientation: Month: Accurate within 5 days Temporal Orientation: Day:  Correct Recall: Sock: Yes, no cue required Recall: Blue: Yes, no cue required Recall: Bed: Yes, no cue required BIMS Summary Score: 15 Sensation Sensation Light Touch: Appears Intact Coordination Gross Motor Movements are Fluid and Coordinated: Yes Fine Motor Movements are Fluid and Coordinated: Yes Motor  Motor Motor: Within Functional Limits  Trunk/Postural Assessment  Cervical Assessment Cervical Assessment: Within Functional Limits Postural Control Postural Control: Within Functional Limits  Balance Dynamic Sitting Balance Dynamic Sitting - Balance Support: No upper extremity supported Dynamic Sitting - Level of Assistance: 5: Stand by assistance Dynamic Sitting - Balance Activities: Reaching for objects Sitting balance - Comments: Able to support self with UE sitting EOB Static Standing Balance Static Standing - Level of Assistance: 4: Min assist (using RW) Extremity/Trunk Assessment RUE Assessment Passive Range of Motion (PROM) Comments: WFL Active Range of Motion (AROM) Comments: WFL General Strength Comments: 3+/5 MMT LUE Assessment Passive Range of Motion (PROM) Comments: WFL Active Range of Motion (AROM) Comments: WFL General Strength Comments: 3+/5 MMT  Care Tool Care Tool Self Care Eating   Eating Assist Level: Set up assist    Oral Care    Oral Care Assist Level: Set up assist    Bathing   Body parts bathed by patient: Right arm;Left arm;Chest;Abdomen;Front perineal area;Buttocks;Right upper leg;Left upper leg;Face   Body parts n/a: Right lower leg;Left lower leg Assist Level: Minimal Assistance - Patient > 75%    Upper Body Dressing(including orthotics)   What is the patient wearing?: Pull over shirt   Assist Level: Set up assist    Lower Body Dressing (excluding footwear)   What is the patient wearing?: Pants;Underwear/pull up Assist for lower body dressing: Minimal Assistance - Patient > 75% (Min to Mod A for threading clothing item over  her feet, hips and bottom.)    Putting on/Taking off footwear   What is the patient wearing?: Non-skid slipper socks Assist for footwear: Moderate Assistance - Patient 50 - 74%       Care Tool Toileting Toileting activity   Assist for toileting: Minimal Assistance - Patient > 75% (CGA to MinA)     Care Tool Bed Mobility Roll left and right activity   Roll left and right assist level: Contact Guard/Touching assist    Sit to lying activity   Sit to lying assist level: Minimal Assistance - Patient > 75%    Lying to sitting on side of bed activity   Lying to sitting on side of bed assist level: the ability to move from lying on the back to sitting on the side of the bed with no back support.: Minimal Assistance - Patient > 75%     Care Tool Transfers Sit to stand transfer   Sit to stand assist level: Minimal Assistance - Patient > 75%    Chair/bed transfer   Chair/bed transfer assist level: Minimal Assistance - Patient > 75% (Mod to MinA)     Toilet transfer   Assist Level: Moderate Assistance - Patient 50 - 74% (based on functional performance with bathing at sink LOF)     Care Tool Cognition  Expression of Ideas and Wants Expression of Ideas and Wants: 4. Without difficulty (complex and basic) - expresses complex messages without difficulty and with speech that is clear and easy to understand  Understanding Verbal and Non-Verbal Content Understanding Verbal and Non-Verbal Content: 4. Understands (complex and basic) - clear comprehension without cues or repetitions   Memory/Recall Ability Memory/Recall Ability : Current season;Location of own room;Staff names and faces   Refer to Care Plan for Long Term Goals  SHORT TERM GOAL WEEK 1 OT Short Term Goal 1 (Week 1): The pt safely transfer to all surface with MinA using AE as needed. OT Short Term Goal 2 (Week 1): The pt will  bathe/dress LB safely with MinA using AE as needed.. OT Short Term Goal 3 (Week 1): The pt will safely  participate in > 30 minutes of Occupational Therapy  activity with minimal rest breaks following demonstration and initial cues. OT Short Term Goal 4 (Week 1): The pt will safely demonstrate a simple homemaking task at [facility] after demonstration and initial cues.  Recommendations for other services: None    Skilled Therapeutic Intervention  Patient seen this day for skilled Occupational Therapy evaluation to address residual deficits impacting her safety and functional independence. The pt was in good spirits at the time of the evaluation with a willingness to participant with a pain response of 5 on a 0-10 that appeared to elevate with activity. The pt agreed to complete a BADL simulating  task in bathing UB/LB  EOB with  UB task performance at s/uA and LB performance at [facility] transition to MinA using the bed rail for repositioning The pt was able to donn her over head shirt with s/uA, she was Mod A for threading her legs through the opening of the pants and MinA for bring them over her hips and bottom.  The pt was MaxA for donn her non-skid socks. The pt presented with increase pain of a 8 on a 0-10 for abdominal pain, nursing was made aware. The pt was able to complete grooming task with s/uA.  The pt wasn/t able to ambulate secondary to her pain response,  however she was able to ambulate to the top of the bed for placement EOB using the RW at [facility]. The pt went on to transfer from standing to sitting EOB with CGA, she was able to place BLE onto the bed with ModA.  Based on the pt's current functional status, I am recommending 12-14 day of Occupational Therapy to improve upon her functional independence to reduce the burden of care for the care person with the current POC in place. Modifications to be made in the plan as needed.  ADL ADL Equipment Provided: Reacher Eating: Other (comment) (feeding tube) Where Assessed-Eating: Bed level Grooming: Setup Where Assessed-Grooming: Sitting at sink Upper Body  Bathing: Setup Where Assessed-Upper Body Bathing: Sitting at sink Lower Body Bathing: Moderate assistance;Minimal assistance Where Assessed-Lower Body Bathing: Edge of bed Upper Body Dressing: Setup Where Assessed-Upper Body Dressing: Edge of bed Lower Body Dressing: Moderate assistance Where Assessed-Lower Body Dressing: Edge of bed Toileting: Minimal assistance;Moderate assistance (based on performance with bathing at sink LOF) Where Assessed-Toileting: Other (Comment) Toilet Transfer: Minimal assistance;Moderate assistance Toilet Transfer Method: Proofreader: Extra wide bedside commode;Grab bars (based on coming from sit to stand at bed and sink LOF) Tub/Shower Transfer: Minimal assistance;Moderate assistance Tub/Shower Transfer Method: Ambulating Tub/Shower Equipment: Shower seat with back;Walk in shower Walk-In Shower Transfer: Minimal assistance;Moderate assistance Film/video editor Method:  (based on patient's ability to come from sit to stand and ambulate along side the bed) Astronomer: Shower seat with back Mobility  Bed Mobility Bed Mobility: Supine to Sit Supine to Sit: Minimal Assistance - Patient > 75%;Contact Guard/Touching assist Transfers Sit to Stand: Minimal Assistance - Patient > 75%;Moderate Assistance - Patient 50-74% Stand to Sit: Minimal Assistance - Patient > 75%;Moderate Assistance - Patient 50-74%   Discharge Criteria: Patient will be discharged from OT if patient refuses treatment 3 consecutive times without medical reason, if treatment goals not met, if there is a change in medical status, if patient makes no progress towards goals or if patient is discharged  from hospital.  The above assessment, treatment plan, treatment alternatives and goals were discussed and mutually agreed upon: by patient and by family  Lauren Gray 08/16/2024, 5:58 PM

## 2024-08-16 NOTE — Progress Notes (Signed)
   Subjective/Chief Complaint: Complains of more pain around left sided tube   Objective: Vital signs in last 24 hours: Temp:  [97.9 F (36.6 C)-99.5 F (37.5 C)] 98.3 F (36.8 C) (10/04 0454) Pulse Rate:  [83-94] 94 (10/04 0454) Resp:  [16-17] 17 (10/04 0454) BP: (138-157)/(53-58) 138/53 (10/04 0454) SpO2:  [95 %-98 %] 95 % (10/04 0454) Weight:  [110.1 kg-110.2 kg] 110.1 kg (10/04 0309) Last BM Date : 08/14/24  Intake/Output from previous day: 10/03 0701 - 10/04 0700 In: 200 [NG/GT:200] Out: 385 [Drains:385] Intake/Output this shift: No intake/output data recorded.  General appearance: alert and cooperative Resp: clear to auscultation bilaterally Cardio: regular rate and rhythm GI: soft, mild tenderness around left sided feeding tube  Lab Results:  Recent Labs    08/14/24 0347 08/16/24 0338  WBC 10.0 7.7  HGB 9.4* 8.5*  HCT 31.1* 27.7*  PLT 448* 345   BMET Recent Labs    08/14/24 0347 08/16/24 0338  NA 140 141  K 4.4 4.2  CL 109 109  CO2 21* 22  GLUCOSE 259* 288*  BUN 20 20  CREATININE 0.99 1.13*  CALCIUM  8.2* 8.1*   PT/INR No results for input(s): LABPROT, INR in the last 72 hours. ABG No results for input(s): PHART, HCO3 in the last 72 hours.  Invalid input(s): PCO2, PO2  Studies/Results: DG Abd 1 View Result Date: 08/15/2024 CLINICAL DATA:  10323 Abdominal distension 10323 EXAM: ABDOMEN - 1 VIEW COMPARISON:  07/27/2024, 07/30/2024 FINDINGS: Interval removal of the esophagogastric tube. Nonobstructive bowel gas pattern.Postsurgical drains in the epigastrium and left mid abdomen.No pneumoperitoneum. No organomegaly or radiopaque calculi. No acute fracture or destructive lesion. Multilevel degenerative disc disease of the spine. IMPRESSION: Nonobstructive bowel gas pattern. Electronically Signed   By: Rogelia Myers M.D.   On: 08/15/2024 21:14    Anti-infectives: Anti-infectives (From admission, onward)    None        Assessment/Plan: s/p * No surgery found * Continue soft diet. Albumin  low Continue drains Off abx. Wbc normal If pain persists will consider repeating CT. Abd xray last night shows normal bowel gas pattern Continue PT/OT  LOS: 1 day    Deward Null III 08/16/2024

## 2024-08-16 NOTE — Progress Notes (Signed)
 Emeline Joesph BROCKS, DO  Physician Physical Medicine and Rehabilitation   PMR Pre-admission     Signed   Date of Service: 08/14/2024  2:53 PM  Related encounter: ED to Hosp-Admission (Discharged) from 07/04/2024 in [facility] 6 [facility]  SURGICAL   Signed     Expand All Collapse All  Show:Clear all [x] Written[x] Templated[] Copied  Added by: [x] Dereona Kolodny, Heron MATSU, RN[x] Emeline Joesph BROCKS, DO  [] Hover for details PMR Admission Coordinator Pre-Admission Assessment   Patient: Lauren Gray is an 73 y.o., female MRN: [id number] DOB: [birth year] Height: 5' 8 (172.7 cm) Weight: 114.8 kg   Insurance Information HMO:     PPO:      PCP:      IPA:      80/20:      OTHER: POS II PRIMARY: [employer]      Policy#: [id number]      Subscriber: pt CM Name: Autumn      Phone#: [phone]     Fax#: [phone] Pre-Cert#: [id number]  auth for CIR from Autumn with [facility] for admit 08/15/24 with next review date 08/21/24.  Updates due to Autumn at fax listed above.        Employer:  Benefits:  Phone #: [phone]     Name: 10/1 Eff. Date: 11/14/23     Deduct: $1250 (met)      Out of Pocket Max: [phone] ( met)      Life Max: none CIR: $300 co pay per admit then covered at 80%      SNF: 80% Outpatient: $0 to $52 per visit pending provider type     Co-Pay:  Home Health: 80%      Co-Pay: 20% DME: 80%     Co-Pay: 20% Providers: in network  SECONDARY: Medicare Part A and B      Policy#: [id number]     Phone#: verified passport one source online 08/05/24   Financial Counselor:       Phone#:    The "Data Collection Information Summary" for patients in Inpatient Rehabilitation Facilities with attached "Privacy Act Statement-Health Care Records" was provided and verbally reviewed with: Patient and Family   Emergency Contact Information Contact Information       Name Relation Home Work Mobile    [place] Spouse [phone]             Other Contacts        Name Relation Home Work Mobile    [place] Daughter     [phone]         Current Medical History  Patient Admitting Diagnosis: Debility   History of Present Illness: 73 yo female with history of HLD, prediabetes, CKD II does not take meds on a regular basis, past surgical history of total hysterectomy and BSO who presented on 07/04/24 with vomiting for 1 month, only holding down crackers and yogurt, has not had a full BM in a few weeks, and weight loss of 10 lbs.    Found to have gastric outlet obstruction. 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 Underwent a Whipple procedure on 07/22/24 along with Jejunostomy. SABRA Postoperative with TNA. Postoperative pain management and mobilization. CT chest showed evidence of bilateral pleural effusion likely third spacing in the setting of hypoalbuminemia. Repeat CXR showed persistent PNA. Underwent thoracocentesis with 250 ml fluid. Cytology negative for any malignant cells. 1 PRBC transfusion on 9/19 followed by 2 more PRBCs transfusions for postop anemia. TNA discontinued and tube feeds ongoing and  at goal rate. Diet upgrades transitioning to soft diet.   Patient's medical record from [facility] has been reviewed by the rehabilitation admission coordinator and physician.   Past Medical History      Past Medical History:  Diagnosis Date   Complication of anesthesia     Obesity     Scoliosis          Has the patient had major surgery during 100 days prior to admission? Yes   Family History   family history includes Aneurysm in her sister; Breast cancer (age of onset: 62) in her sister; Cancer in her mother; Diabetes in her father and paternal grandmother; Heart disease in her father.   Current Medications  Current Medications    Current Facility-Administered Medications:    amLODipine  (NORVASC ) tablet 10 mg, 10 mg, Oral, Daily, Danton Purchase T, MD, 10 mg at 08/15/24 0857   carvedilol (COREG) tablet 6.25 mg,  6.25 mg, Oral, BID WC, Danton Purchase DASEN, MD, 6.25 mg at 08/15/24 0857   Chlorhexidine  Gluconate Cloth 2 % PADS 6 each, 6 each, Topical, Q0600, Byerly, Faera, MD, 6 each at 08/15/24 0458   cloNIDine  (CATAPRES ) tablet 0.1 mg, 0.1 mg, Oral, BID, Danton Purchase T, MD, 0.1 mg at 08/15/24 0857   feeding supplement (OSMOLITE 1.5 CAL) liquid 1,260 mL, 1,260 mL, Per Tube, Q24H, Danton Purchase DASEN, MD, 1,260 mL at 08/15/24 1525   feeding supplement (PROSource TF20) liquid 60 mL, 60 mL, Per Tube, Daily, Hogan, Kendra Paige, PA-C, 60 mL at 08/15/24 0856   fiber supplement (BANATROL TF) liquid 60 mL, 60 mL, Per Tube, BID, Simaan, Elizabeth S, PA-C, 60 mL at 08/15/24 0856   fluticasone  (FLONASE ) 50 MCG/ACT nasal spray 2 spray, 2 spray, Each Nare, Daily, Byerly, Faera, MD, 2 spray at 08/14/24 0852   free water  100 mL, 100 mL, Per Tube, Q8H, Danton Purchase DASEN, MD, 100 mL at 08/15/24 1528   gabapentin  (NEURONTIN ) 250 MG/5ML solution 100 mg, 100 mg, Per Tube, Q8H, Tobie Yetta HERO, MD, 100 mg at 08/15/24 1333   Glycerin  (Adult) 2 g suppository 1 suppository, 1 suppository, Rectal, PRN, Aron Shoulders, MD   heparin  injection 5,000 Units, 5,000 Units, Subcutaneous, Q8H, Patel, Pranav M, MD, 5,000 Units at 08/15/24 1332   HYDROcodone-acetaminophen  (NORCO/VICODIN) 5-325 MG per tablet 1-2 tablet, 1-2 tablet, Oral, Q6H PRN, Laron Agent, RPH   insulin  aspart (novoLOG ) injection 0-15 Units, 0-15 Units, Subcutaneous, TID WC, Danton Purchase DASEN, MD, 3 Units at 08/15/24 1333   insulin  aspart (novoLOG ) injection 0-5 Units, 0-5 Units, Subcutaneous, QHS, Danton Purchase DASEN, MD, 2 Units at 08/12/24 2237   insulin  aspart (novoLOG ) injection 6 Units, 6 Units, Subcutaneous, TID WC, Danton Purchase DASEN, MD, 6 Units at 08/15/24 1334   insulin  glargine (LANTUS ) injection 10 Units, 10 Units, Subcutaneous, BID, Danton Purchase DASEN, MD, 10 Units at 08/15/24 9145   lidocaine  (XYLOCAINE ) 2 % viscous mouth solution 15 mL, 15 mL,  Mouth/Throat, Q4H PRN, Aron Shoulders, MD   lipase/protease/amylase (CREON ) capsule 24,000 Units, 24,000 Units, Oral, TID AC, Byerly, Faera, MD, 24,000 Units at 08/15/24 1332   loperamide  (IMODIUM ) capsule 2 mg, 2 mg, Oral, TID, Byerly, Faera, MD, 2 mg at 08/15/24 0857   menthol -cetylpyridinium (CEPACOL) lozenge 3 mg, 1 lozenge, Oral, PRN, Aron Shoulders, MD   methocarbamol  (ROBAXIN ) injection 500 mg, 500 mg, Intravenous, Q8H, Byerly, Faera, MD, 500 mg at 08/15/24 1332   morphine  (PF) 2 MG/ML injection 2 mg, 2 mg, Intravenous, Q2H PRN, Danton Purchase  T, MD   ondansetron  (ZOFRAN ) injection 4 mg, 4 mg, Intravenous, Q4H PRN, Byerly, Faera, MD, 4 mg at 08/14/24 0739   Oral care mouth rinse, 15 mL, Mouth Rinse, PRN, Aron Shoulders, MD   pantoprazole  (PROTONIX ) EC tablet 40 mg, 40 mg, Oral, BID, Danton Reyes DASEN, MD, 40 mg at 08/15/24 0857   phenol (CHLORASEPTIC) mouth spray 1 spray, 1 spray, Mouth/Throat, PRN, Byerly, Faera, MD, 1 spray at 07/14/24 1957   promethazine  (PHENERGAN ) 12.5 mg in sodium chloride  0.9 % 50 mL IVPB, 12.5 mg, Intravenous, Q6H PRN, Danton Reyes DASEN, MD, Last Rate: 150 mL/hr at 08/10/24 1334, 12.5 mg at 08/10/24 1334   sodium chloride  flush (NS) 0.9 % injection 10-40 mL, 10-40 mL, Intracatheter, PRN, Byerly, Faera, MD   traMADol  (ULTRAM ) tablet 50 mg, 50 mg, Per Tube, Q12H PRN, Laron Agent, RPH   traZODone  (DESYREL ) tablet 50 mg, 50 mg, Per Tube, QHS, Patel, Pranav M, MD, 50 mg at 08/14/24 2207     Patients Current Diet:  Diet Order                  DIET SOFT Room service appropriate? Yes; Fluid consistency: Thin  Diet effective now                       Precautions / Restrictions Precautions Precautions: Fall, Other (comment) Precaution/Restrictions Comments: JP drainx2 (L), PEG tube Restrictions Weight Bearing Restrictions Per Provider Order: No    Has the patient had 2 or more falls or a fall with injury in the past year? No   Prior Activity  Level Community (5-7x/wk): Independent without AD, drives, works remote for [employer]   Prior Functional Level Self Care: Did the patient need help bathing, dressing, using the toilet or eating? Independent   Indoor Mobility: Did the patient need assistance with walking from room to room (with or without device)? Independent   Stairs: Did the patient need assistance with internal or external stairs (with or without device)? Independent   Functional Cognition: Did the patient need help planning regular tasks such as shopping or remembering to take medications? Independent   Patient Information Are you of Hispanic, Latino/a,or Spanish origin?: A. No, not of Hispanic, Latino/a, or Spanish origin What is your race?: B. Black or African American Do you need or want an interpreter to communicate with a doctor or health care staff?: 0. No   Patient's Response To:  Health Literacy and Transportation Is the patient able to respond to health literacy and transportation needs?: Yes Health Literacy - How often do you need to have someone help you when you read instructions, pamphlets, or other written material from your doctor or pharmacy?: Never In the past 12 months, has lack of transportation kept you from medical appointments or from getting medications?: No In the past 12 months, has lack of transportation kept you from meetings, work, or from getting things needed for daily living?: No   Home Assistive Devices / Equipment Home Equipment: Agricultural consultant (2 wheels), [employer] - single point, Hand held shower head, Grab bars - tub/shower   Prior Device Use: Indicate devices/aids used by the patient prior to current illness, exacerbation or injury? None of the above   Current Functional Level Cognition   Orientation Level: Oriented X4    Extremity Assessment (includes Sensation/Coordination)   Upper Extremity Assessment: Generalized weakness  Lower Extremity Assessment: Defer to PT  evaluation     ADLs   Overall  ADL's : Needs assistance/impaired Eating/Feeding: NPO Grooming: Set up, Sitting Upper Body Bathing: Moderate assistance Lower Body Bathing: Moderate assistance Upper Body Dressing : Moderate assistance Lower Body Dressing: Minimal assistance Lower Body Dressing Details (indicate cue type and reason): for R sock Toilet Transfer: Minimal assistance, Ambulation, Rolling walker (2 wheels) Toilet Transfer Details (indicate cue type and reason): Short distance simulated in room Toileting- Clothing Manipulation and Hygiene: Moderate assistance Functional mobility during ADLs: Contact guard assist, Minimal assistance, Rolling walker (2 wheels) General ADL Comments: Pt limited by fear and poor balance     Mobility   Overal bed mobility: Needs Assistance Bed Mobility: Rolling, Sit to Sidelying Rolling: Contact guard assist Sidelying to sit: Contact guard assist, Used rails Supine to sit: Min assist Sit to supine: Mod assist Sit to sidelying: Min assist, Used rails General bed mobility comments: OOB in chair     Transfers   Overall transfer level: Needs assistance Equipment used: Rolling walker (2 wheels) Transfers: Sit to/from Stand Sit to Stand: Min assist Bed to/from chair/wheelchair/BSC transfer type:: Step pivot Step pivot transfers: +2 safety/equipment, Contact guard assist General transfer comment: Cues for rocking to gain momentum, minA to power up to stand     Ambulation / Gait / Stairs / Wheelchair Mobility   Ambulation/Gait Ambulation/Gait assistance: Contact guard assist Gait Distance (Feet): 15 Feet (+ 25) Assistive device: Rolling walker (2 wheels) Gait Pattern/deviations: Step-through pattern, Decreased stride length, Trunk flexed General Gait Details: Slow, but overall steady pace, cues for closer proximity to RW especially with turning, pt with some c/o dizziness in standing, discused and educated on gaze stabilization techniques when up  ambulating Gait velocity: Decreased Gait velocity interpretation: <1.8 ft/sec, indicate of risk for recurrent falls Pre-gait activities: pt taking steps from EOB to recliner for ~ 4 ft with reciprocal gait pattern with short step length and mid foot initial contact. Moderate UE support on RW and CGA for safety     Posture / Balance Dynamic Sitting Balance Sitting balance - Comments: Able to support self with UE sitting EOB Balance Overall balance assessment: Needs assistance Sitting-balance support: Feet supported, Single extremity supported Sitting balance-Leahy Scale: Fair Sitting balance - Comments: Able to support self with UE sitting EOB Standing balance support: During functional activity, Bilateral upper extremity supported, Reliant on assistive device for balance Standing balance-Leahy Scale: Poor Standing balance comment: Reliant on RW     Special considerations/life events  Jejunostomy 9/9 18 fr LUQ    Previous Home Environment  Living Arrangements: Spouse/significant other (daughter Cassius)  Lives With: Spouse, Daughter Available Help at Discharge: Family, Available 24 hours/day Type of Home: House Home Layout: Multi-level Alternate Level Stairs-Rails: Can reach both Alternate Level Stairs-Number of Steps: 5 + 6 to get to main level Home Access: Level entry Bathroom Shower/Tub: Engineer, manufacturing systems: Handicapped height Bathroom Accessibility: Yes Home Care Services: No   Discharge Living Setting Plans for Discharge Living Setting: Patient's home, Lives with (comment) (spouse and daughter, Cassius) Type of Home at Discharge: House Discharge Home Layout: Multi-level Alternate Level Stairs-Rails: Can reach both, Right, Left Alternate Level Stairs-Number of Steps: 5 to 6 to get to main level Discharge Home Access: Level entry Discharge Bathroom Shower/Tub: Tub/shower unit Discharge Bathroom Toilet: Handicapped height Discharge Bathroom Accessibility: Yes How  Accessible: Accessible via walker Does the patient have any problems obtaining your medications?: No   Social/Family/Support Systems Patient Roles: Spouse, Parent (employee) Contact Information: spouse and daughter Anticipated Caregiver: spouse and daughter Anticipated Industrial/product designer  Information: see contacts Ability/Limitations of Caregiver: spouse uses cane, daughter works, has sister Heron also Caregiver Availability: 24/7 Discharge Plan Discussed with Primary Caregiver: Yes Is Caregiver In Agreement with Plan?: Yes Does Caregiver/Family have Issues with Lodging/Transportation while Pt is in Rehab?: No   Goals Patient/Family Goal for Rehab: supervision PT, supervision to min OT Expected length of stay: ELOS 10 to 14 days Pt/Family Agrees to Admission and willing to participate: Yes Program Orientation Provided & Reviewed with Pt/Caregiver Including Roles  & Responsibilities: Yes   Decrease burden of Care through IP rehab admission: n/a   Possible need for SNF placement upon discharge: not anticipated   Patient Condition: I have reviewed medical records from [facility], spoken with  patient, spouse, daughter, and family member. I met with patient at the bedside and discussed via phone for inpatient rehabilitation assessment.  Patient will benefit from ongoing PT and OT, can actively participate in 3 hours of therapy a day 5 days of the week, and can make measurable gains during the admission.  Patient will also benefit from the coordinated team approach during an Inpatient Acute Rehabilitation admission.  The patient will receive intensive therapy as well as Rehabilitation physician, nursing, social worker, and care management interventions.  Due to bladder management, bowel management, safety, skin/wound care, disease management, medication administration, pain management, and patient education the patient requires 24 hour a day rehabilitation nursing.  The patient is  currently overall min assist with mobility and basic ADLs.  Discharge setting and therapy post discharge at home with home health is anticipated.  Patient has agreed to participate in the Acute Inpatient Rehabilitation Program and will admit today.   Preadmission Screen Completed By:  Alison Heron Lot, RN MSN 08/15/2024 3:59 PM ______________________________________________________________________   Discussed status with Dr. Emeline on 08/15/24 at 1600 and received approval for admission today.   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 Yes Co-Morbidities requiring supervision/potential complications: Protein-calorie malnutrition, plural effusions, ABLA, hyperglycemia, hypertension, AKI on CKD stage 2, urinary incontinence, and post-op wound care Due to bladder management, bowel management, safety, skin/wound care, disease management, medication administration, pain management, and patient education, does the patient require 24 hr/day rehab nursing? Yes Does the patient require coordinated care of a physician, rehab nurse, PT, OT to address physical and functional deficits in the context of the above medical diagnosis(es)? Yes Addressing deficits in the following areas: balance, endurance, locomotion, strength, transferring, bowel/bladder control, bathing, dressing, grooming, and toileting Can the patient actively participate in an intensive therapy program of at least 3 hrs of therapy 5 days a week? Yes The potential for patient to make measurable gains while on inpatient rehab is excellent Anticipated functional outcomes upon discharge from inpatient rehab:  supervision PT, supervision OT Estimated rehab length of stay to reach the above functional goals is: 10-14 days Anticipated discharge destination: Home 10. Overall Rehab/Functional Prognosis: good     MD Signature:   Joesph JAYSON Emeline, DO 08/15/2024          Revision History

## 2024-08-16 NOTE — Evaluation (Signed)
 Physical Therapy Assessment and Plan  Patient Details  Name: Lauren Gray MRN: [id number] Date of Birth: Mar 27, 1951  PT Diagnosis: Difficulty walking Rehab Potential: Good ELOS: 10-14 days   Today's Date: 08/16/2024 PT Individual Time: 0900-1015 + 1100-1120 PT Individual Time Calculation (min): 75 min  + 30 mins Missed Time: 40 mins  Hospital Problem: Principal Problem:   Debility   Past Medical History:  Past Medical History:  Diagnosis Date   Complication of anesthesia    Obesity    Scoliosis    Past Surgical History:  Past Surgical History:  Procedure Laterality Date   ABDOMINAL HYSTERECTOMY     TVH   BONE BIOPSY  07/06/2024   Procedure: BIOPSY, GI;  Surgeon: Elicia Claw, MD;  Location: [facility];  Service: Gastroenterology;;   ENTEROSCOPY N/A 07/06/2024   Procedure: ENTEROSCOPY;  Surgeon: Elicia Claw, MD;  Location: [facility];  Service: Gastroenterology;  Laterality: N/A;   JEJUNOSTOMY N/A 07/22/2024   Procedure: CREATION, JEJUNOSTOMY;  Surgeon: Aron Shoulders, MD;  Location: [facility] OR;  Service: General;  Laterality: N/A;   PELVIC LAPAROSCOPY  04/06/2006   BSO   WHIPPLE PROCEDURE N/A 07/22/2024   Procedure: WHIPPLE PROCEDURE;  Surgeon: Aron Shoulders, MD;  Location: [facility] OR;  Service: General;  Laterality: N/A;    Assessment & Plan Clinical Impression: Patient  is a 73 year old female with history of obesity, pre-diabetes, intermittent N/V for a month and was admitted on 07/04/24 for work up. She reported constipation as well as 10 lbs wt loss in recent months. She had been seen by Dr Rosalie in office a week PTA with plans for endoscopy. 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 She was started on TPN as well as clear liquids diet pending surgery. She continued to have issues with N/V and  NGT placed 08/29 with improvement in symptoms. Elevated BP  treated with IV meds and insulin  added for hyperglycemia.    Sh underwent Whipple procedure with placement of pancreatic duct stent and jejunostomy tube on 09/09 by Dr. Aron. Post op course significant for fluid overload with rise in LFTs TB, pain control requiring dilaudid  PCA, recurrent N/V 09/14 requiring replacement of NG as well as   issues with confusion due to narcotics. Broad spectrum antibiotics added due to tachypnea with leucocytosis 09/18 due to pancreatic leak and PNA. CT A/P/chest negative for PE, showed small to moderate bilateral pleural effusions and large amount of inflammatory stranding and fluid seen in region of pancreatic head/body on imaging. .    She pulled out NGT on 09/17 and started on clears. Left pleural effusion tapped for 250 cc on 09/20. Has had murky/pruluent drainage from right [place] drain.    Diarrhea managed with addition of fiber and pancreatic enzymes. ABLA treated with 5 units total PRBC. Has completed 7 day course of Vanc, Zosyn  and Diflucan  on 09/27. Issues with blood pressure felt to be related to pain and anxiety and amlodipine  added for BP control. Acute on chronic renal failure resolved. Has been transitioned to tube feeds. PT/OT has been working with patient who is showing improvement in activity tolerance but limited by fatigue and weakness. She has  has issues with dizziness, working on gaze stabilization techniques and requires min to CGA with mobility and min assist with ADLs.  She was independent PTA and CIR recommended due to functional decline.  Patient transferred to CIR on 08/15/2024 .  Patient currently requires min with mobility secondary to decreased activity tolerance and decreased standing balance.  Prior to hospitalization, patient was independent  with mobility and lived with Spouse, Daughter in a House home.  Home access is  Level entry, Other (comment) (has 5 + 6 steps with landing between to access main level of house).  Patient will benefit  from skilled PT intervention to maximize safe functional mobility, minimize fall risk, and decrease caregiver burden for planned discharge home with 24 hour supervision.  Anticipate patient will benefit from follow up OP or HHPT at discharge.    PT - End of Session   PT - End of Session Activity Tolerance: Tolerates 30+ min activity with multiple rests Endurance Deficit: Yes Endurance Deficit Description: high fatigue during transfers, limits pt from amb    PT - Assessment   PT Assessment Rehab Potential (ACUTE/IP ONLY): Good PT Barriers to Discharge: Wound Care PT Patient demonstrates impairments in the following area(s): Balance PT Transfers Functional Problem(s): Bed Mobility;Bed to Chair;Car PT Locomotion Functional Problem(s): Ambulation;Stairs   PT Plan   PT Plan PT Intensity: Minimum of 1-2 x/day ,45 to 90 minutes PT Frequency: 5 out of 7 days PT Duration Estimated Length of Stay: 10-14 days PT Treatment/Interventions: Ambulation/gait training;Balance/vestibular training;Community reintegration;Discharge planning;Disease management/prevention;Cognitive remediation/compensation;DME/adaptive equipment instruction;Functional mobility training;Neuromuscular re-education;Pain management;Patient/family education;Psychosocial support;Skin care/wound management;Splinting/orthotics;Stair training;Therapeutic Activities;Therapeutic Exercise;UE/LE Strength taining/ROM;UE/LE Coordination activities;Wheelchair propulsion/positioning PT Transfers Anticipated Outcome(s): Supervision PT Locomotion Anticipated Outcome(s): Supervision + LRAD   PT Recommendation   PT Recommendation Recommendations for Other Services: Vestibular eval Follow Up Recommendations: Outpatient PT;Home health PT (TBD home health vs outpatient PT) Patient destination: Home Equipment Recommended: To be determined;Rolling walker with 5 wheels Equipment Details: RW at home is tall version for husband, needs RW       PT Evaluation Precautions/Restrictions   Precautions Precautions: Fall;Other (comment) Precaution/Restrictions Comments: JP drainx2 (L), PEG tube Restrictions Weight Bearing Restrictions Per Provider Order: No    General   Chart Reviewed: Yes Family/Caregiver Present: Yes (sister)  Vital Signs    Pain   Pain Assessment Pain Scale: 0-10 Pain Score: 4  Pain Type: Acute pain;Surgical pain Pain Location: Abdomen Pain Orientation: Mid;Left Pain Descriptors / Indicators: Sharp Pain Onset: Unable to tell Patients Stated Pain Goal: 0 Pain Intervention(s): Repositioned   Pain Interference   Pain Interference Pain Effect on Sleep: 2. Occasionally Pain Interference with Therapy Activities: 0. Does not apply - I have not received rehabilitationtherapy in the past 5 days Pain Interference with Day-to-Day Activities: 2. Occasionally  Home Living/Prior Functioning   Home Living Available Help at Discharge: Family;Available 24 hours/day Type of Home: House Home Access: Level entry;Other (comment) (has 5 + 6 steps with landing between to access main level of house) Home Layout: Multi-level Alternate Level Stairs-Number of Steps: 5 + 6 to get to main level Alternate Level Stairs-Rails: Left (prefered entrance has 1 rail) Bathroom Shower/Tub: Engineer, manufacturing systems: Handicapped height Bathroom Accessibility: Yes  Lives With: Spouse;Daughter Prior Function Level of Independence: Independent with gait;Independent with transfers  Able to Take Stairs?: Yes    Vision/Perception    Vision - History Ability to See in Adequate Light: 0 Adequate   Cognition   Overall Cognitive Status: Within Functional Limits for tasks assessed Arousal/Alertness: Awake/alert Orientation Level: Oriented X4  Sensation   Sensation Light Touch: Appears Intact Coordination Gross Motor Movements are Fluid and Coordinated: Yes  Motor        Trunk/Postural Assessment     Cervical  Assessment Cervical Assessment: Within Functional Limits Lumbar Assessment Lumbar Assessment: Within Functional Limits Postural Control Postural Control: Within Functional Limits   Balance   Balance Balance Assessed: Yes Dynamic Sitting Balance Dynamic Sitting - Balance Support: No upper extremity supported Dynamic Sitting - Level of Assistance: 5: Stand by assistance Dynamic Sitting - Balance Activities: Reaching for objects Static Standing Balance Static Standing - Balance Support: Bilateral upper extremity supported Static Standing - Level of Assistance: 4: Min assist  Extremity Assessment        RLE Assessment RLE Assessment: Within Functional Limits LLE Assessment LLE Assessment: Within Functional Limits    Care Tool Care Tool Bed Mobility Roll left and right activity   Roll left and right assist level: Contact Guard/Touching assist    Sit to lying activity   Sit to lying assist level: Minimal Assistance - Patient > 75%    Lying to sitting on side of bed activity   Lying to sitting on side of bed assist level: the ability to move from lying on the back to sitting on the side of the bed with no back support.: Minimal Assistance - Patient > 75%     Care Tool Transfers Sit to stand transfer   Sit to stand assist level: Minimal Assistance - Patient > 75%    Chair/bed transfer Chair/bed transfer activity did not occur: Safety/medical concerns (2/2 high fatigue)      Car transfer Car transfer activity did not occur: Safety/medical concerns (2/2 high fatigue)        Care Tool Locomotion Ambulation Ambulation activity did not occur: Safety/medical concerns (2/2 high fatigue)        Walk 10 feet activity Walk 10 feet activity did not occur: Safety/medical concerns (2/2 high fatigue)       Walk 50 feet with 2 turns activity Walk 50 feet with 2 turns activity did not occur: Safety/medical concerns (2/2 high fatigue)      Walk 150 feet activity Walk 150  feet activity did not occur: Safety/medical concerns      Walk 10 feet on uneven surfaces activity Walk 10 feet on uneven surfaces activity did not occur: Safety/medical concerns (2/2 high fatigue)      Stairs Stair activity did not occur: Safety/medical concerns (2/2 high fatigue)        Walk up/down 1 step activity Walk up/down 1 step or curb (drop down) activity did not occur: Safety/medical concerns (2/2 high fatigue)      Walk up/down 4 steps activity Walk up/down 4 steps activity did not occur: Safety/medical concerns (2/2 high fatigue)      Walk up/down 12 steps activity Walk up/down 12 steps activity did not occur: Safety/medical concerns (2/2 high fatigue)      Pick up small objects from floor Pick up small object from the floor (from standing position) activity did not occur: Safety/medical concerns (2/2 high fatigue)      Wheelchair Is the patient using a wheelchair?: Yes Type of Wheelchair: Manual Wheelchair activity did not occur: Safety/medical concerns (2/2 high fatigue)      Wheel 50 feet with 2 turns activity Wheelchair 50 feet with 2 turns activity did not occur: Safety/medical concerns (2/2 high fatigue)    Wheel 150 feet activity Wheelchair 150 feet activity did not occur: Safety/medical concerns (2/2 high fatigue)      Refer to Care Plan for Long Term Goals  SHORT TERM GOAL WEEK 1 PT Short Term Goal 1 (Week 1): Pt will complete bed mobility using  CGA PT Short Term Goal 2 (Week 1): Pt will complete SPT to chair/WC using CGA + LRAD PT Short Term Goal 3 (Week 1): Pt will amb 68ft using CGA + LRAD PT Short Term Goal 4 (Week 1): Pt will initiate stair training  Recommendations for other services: None   Skilled Therapeutic Intervention Mobility  Bed Mobility Bed Mobility: Supine to Sit Supine to Sit: Contact Guard/Touching assist Transfers Transfers: Stand to Sit;Sit to Stand Sit to Stand: Minimal Assistance - Patient > 75% Stand to Sit: Minimal  Assistance - Patient > 75% Transfer (Assistive device): Rolling walker  Locomotion  Gait Ambulation: No Gait Gait: No Stairs / Additional Locomotion Stairs: No Wheelchair Mobility Wheelchair Mobility: No    Session 1: Chart reviewed and pt agreeable to therapy. Pt received semi-reclined in bed with pain as described above. Also of note, pt explained situation of being transferred to CIR floor late in the night, and pt now with high fatigue. Session focused on evaluation to establish therapy goals. Pt initiated session with evaluation as stated above. Pt noted to have very high fatigue during stand and voiced inability to amb at this time.  Pt then completed bed mobility with minA. Session education emphasized therapy goals, role of PT, care continuum, and CIR fall prevention. At end of session, pt was left semi-reclined in bed with alarm engaged, nurse call bell and all needs in reach.  Session 2: Chart reviewed and pt agreeable to therapy. Pt received semi-reclined in bed with same pain noted as prior session. Also of note, pt still highly fatigued. Session focused on continued bed mobility and education on therapy procedures. Pt required modA to reposition in bed. Pt and PT then clarified therapy goals and role of PT. At end of session, pt was left semi-reclined in bed with alarm engaged, nurse call bell and all needs in reach. Pt missed 40 mins of therapy 2/2 high fatigue after overnight transfer to CIR floor.    Discharge Criteria: Patient will be discharged from PT if patient refuses treatment 3 consecutive times without medical reason, if treatment goals not met, if there is a change in medical status, if patient makes no progress towards goals or if patient is discharged from hospital.  The above assessment, treatment plan, treatment alternatives and goals were discussed and mutually agreed upon: by patient and by family  Gabryela Kimbrell G Chaun Uemura 08/16/2024, 11:45 AM

## 2024-08-16 NOTE — Progress Notes (Signed)
 PROGRESS NOTE   Subjective/Complaints:  Pt reports LBM 2 days ago or so. Hasn't gone yet-  Pain not much- mild on L side of abdomen.   Alb 1.5!!! Hb 8.5 down from 9.4 2 days ago.   KUB showed nonobstructive gas pattern-  Independent review doesn't show a lot of stool burden.   ROS:  Pt denies SOB, (+ mild abd pain, CP, N/V/ (+) mild C/D, and vision changes   Objective:   DG Abd 1 View Result Date: 08/15/2024 CLINICAL DATA:  10323 Abdominal distension 10323 EXAM: ABDOMEN - 1 VIEW COMPARISON:  07/27/2024, 07/30/2024 FINDINGS: Interval removal of the esophagogastric tube. Nonobstructive bowel gas pattern.Postsurgical drains in the epigastrium and left mid abdomen.No pneumoperitoneum. No organomegaly or radiopaque calculi. No acute fracture or destructive lesion. Multilevel degenerative disc disease of the spine. IMPRESSION: Nonobstructive bowel gas pattern. Electronically Signed   By: Rogelia Myers M.D.   On: 08/15/2024 21:14   Recent Labs    08/14/24 0347 08/16/24 0338  WBC 10.0 7.7  HGB 9.4* 8.5*  HCT 31.1* 27.7*  PLT 448* 345   Recent Labs    08/14/24 0347 08/16/24 0338  NA 140 141  K 4.4 4.2  CL 109 109  CO2 21* 22  GLUCOSE 259* 288*  BUN 20 20  CREATININE 0.99 1.13*  CALCIUM  8.2* 8.1*    Intake/Output Summary (Last 24 hours) at 08/16/2024 1724 Last data filed at 08/16/2024 1418 Gross per 24 hour  Intake 636 ml  Output 385 ml  Net 251 ml        Physical Exam: Vital Signs Blood pressure (!) 146/58, pulse 81, temperature 98.8 F (37.1 C), temperature source Oral, resp. rate 19, height 5' 8 (1.727 m), weight 110.1 kg, SpO2 97%.    General: awake, alert, appropriate, sitting up EOB with therapy; about to stand; NAD HENT: conjugate gaze; oropharynx a little dry;  CV: regular rate and rhythm; no JVD Pulmonary: CTA B/L; no W/R/R- good air movement GI: soft, protuberant vs moderate distension;  Mildly TTP throughout- no rebound; hypoactive BS- somewhat quiet Psychiatric: appropriate- interactive Neurological: Ox3 Skin:  Midline incision covered in clean dressing Drain sites c/d/i         Neuro: AAOx4. No apparent cognitive deficits   DTRs: Reflexes were 2+ in bilateral achilles, patella, biceps, BR and triceps. Babinsky: flexor responses b/l.   Hoffmans: negative b/l Sensory exam: revealed normal sensation in all dermatomal regions in bilateral upper extremities and bilateral lower extremities Motor exam: strength 5-/5 throughout bilateral upper extremities and bilateral lower extremities Coordination: Fine motor coordination was normal.        Assessment/Plan: 1. Functional deficits which require 3+ hours per day of interdisciplinary therapy in a comprehensive inpatient rehab setting. Physiatrist is providing close team supervision and 24 hour management of active medical problems listed below. Physiatrist and rehab team continue to assess barriers to discharge/monitor patient progress toward functional and medical goals  Care Tool:  Bathing              Bathing assist       Upper Body Dressing/Undressing Upper body dressing        Upper body assist  Lower Body Dressing/Undressing Lower body dressing            Lower body assist       Toileting Toileting    Toileting assist       Transfers Chair/bed transfer  Transfers assist  Chair/bed transfer activity did not occur: Safety/medical concerns (2/2 high fatigue)        Locomotion Ambulation   Ambulation assist   Ambulation activity did not occur: Safety/medical concerns (2/2 high fatigue)          Walk 10 feet activity   Assist  Walk 10 feet activity did not occur: Safety/medical concerns (2/2 high fatigue)        Walk 50 feet activity   Assist Walk 50 feet with 2 turns activity did not occur: Safety/medical concerns (2/2 high fatigue)         Walk 150  feet activity   Assist Walk 150 feet activity did not occur: Safety/medical concerns         Walk 10 feet on uneven surface  activity   Assist Walk 10 feet on uneven surfaces activity did not occur: Safety/medical concerns (2/2 high fatigue)         Wheelchair     Assist Is the patient using a wheelchair?: Yes Type of Wheelchair: Manual Wheelchair activity did not occur: Safety/medical concerns (2/2 high fatigue)         Wheelchair 50 feet with 2 turns activity    Assist    Wheelchair 50 feet with 2 turns activity did not occur: Safety/medical concerns (2/2 high fatigue)       Wheelchair 150 feet activity     Assist  Wheelchair 150 feet activity did not occur: Safety/medical concerns (2/2 high fatigue)       Blood pressure (!) 146/58, pulse 81, temperature 98.8 F (37.1 C), temperature source Oral, resp. rate 19, height 5' 8 (1.727 m), weight 110.1 kg, SpO2 97%.   Medical Problem List and Plan: 1. 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  Antithrombotics: -DVT/anticoagulation:  Pharmaceutical: Lovenox              -antiplatelet therapy: N/A 3. Pain Management: Tylenol  prn. Oxycodone  d/c 9/28. Hydrocodone prn.    10/4- Pain mild 4/10 with therapy- - mainly abd discomfort- con't regimen 4. Mood/Behavior/Sleep: LCSW to follow for evaluation and support.              -antipsychotic agents: N/A 5. Neuropsych/cognition: This patient is capable of making decisions on her own behalf. 6. Skin/Wound Care: Monitor wound for healing.  7. Fluids/Electrolytes/Nutrition: Monitor I/O. Continue tube feeds 8. Duodenal adenocarcinoma  w/gastric outlet obst:  continues on tube feed for nutritional support.  --Advanced to soft diet 10/01 and nausea since yesterday.              --completed 10-13 day course antibiotics for multifocal PNA/pancreatic leak.    9. Abdominal distension:  No BM today and reports discomfort getting worse since lunch.               --  KUB ordered - nonobstructive bowel gas -- Will change imodium  to prn as diarrhea has resolved--family feels it's not needed.              --Simethicone added.    10/4- NO BM so far today- KUB shows nonobstructive bowel gas pattern- no major stool- if pain persists or gets worse, surgery agrees with me to get CT of abd/pelvis f/u.  10. Fluid overload/Pleural effusions: In part due to third spacing. S/p thoracocentesis.  --Weight 241 at admission-->271-->253 today. Continue daily wts.  --Encourage pulmonary hygiene   11. HTN: Monitor BP TID--on amlodipine , catapres  and coreg              - Add hydralazine  25 mg Q8H PRN for HTN >170 SBP or >100 DBP, given recent highs   10/4- BP running low 140s- con't regimen 12.  Prediabetes: Hgb A1c- 6.1. Elevated BS due to tube feeds             --continue Lantus  with meal coverage and SSI             --monitor BS ac/hs and use SSI for elevated BS   10/4- BG's running 143 to 304- (283 and 304 early AM)- not sure why- will monitor clinically for 24 hours and then make changes 13. Acute on chronic kidney disease: SCr 1.3 at admission and now WNL 14.  Obesity  Class 2: BMI 38.47 15.  ABLA: Recheck CBC in am. Hgb 8-9 range.   10/4- Hb 8.5- was 9.4 but has been 8.7 in last 10 days- will monitor and recheck in AM 16. Severe protein malnutrition- Albumin  of 1.5- is declining- will start Glucerna if doesn't have already.   I spent a total of  51  minutes on total care today- >50% coordination of care- due to  D/w pt, PT< nursing and review of chart including surgery, therapy notes, labs, ind read of KUB and ready by rads; and B/B charting as  well as vitals    LOS: 1 days A FACE TO FACE EVALUATION WAS PERFORMED  Lianette Broussard 08/16/2024, 5:24 PM

## 2024-08-17 ENCOUNTER — Other Ambulatory Visit: Payer: Self-pay

## 2024-08-17 DIAGNOSIS — R5381 Other malaise: Secondary | ICD-10-CM | POA: Diagnosis not present

## 2024-08-17 LAB — BASIC METABOLIC PANEL WITH GFR
Anion gap: 11 (ref 5–15)
BUN: 23 mg/dL (ref 8–23)
CO2: 21 mmol/L — ABNORMAL LOW (ref 22–32)
Calcium: 8.2 mg/dL — ABNORMAL LOW (ref 8.9–10.3)
Chloride: 106 mmol/L (ref 98–111)
Creatinine, Ser: 1.07 mg/dL — ABNORMAL HIGH (ref 0.44–1.00)
GFR, Estimated: 55 mL/min — ABNORMAL LOW (ref >60)
Glucose, Bld: 287 mg/dL — ABNORMAL HIGH (ref 70–99)
Potassium: 4.2 mmol/L (ref 3.5–5.1)
Sodium: 138 mmol/L (ref 135–145)

## 2024-08-17 LAB — GLUCOSE, CAPILLARY
Glucose-Capillary: 271 mg/dL — ABNORMAL HIGH (ref 70–99)
Glucose-Capillary: 204 mg/dL — ABNORMAL HIGH (ref 70–99)
Glucose-Capillary: 208 mg/dL — ABNORMAL HIGH (ref 70–99)
Glucose-Capillary: 245 mg/dL — ABNORMAL HIGH (ref 70–99)

## 2024-08-17 LAB — CBC WITH DIFFERENTIAL/PLATELET
Abs Immature Granulocytes: 0.02 K/uL (ref 0.00–0.07)
Basophils Absolute: 0 K/uL (ref 0.0–0.1)
Basophils Relative: 0 %
Eosinophils Absolute: 0.1 K/uL (ref 0.0–0.5)
Eosinophils Relative: 1 %
HCT: 27.2 % — ABNORMAL LOW (ref 36.0–46.0)
Hemoglobin: 8.4 g/dL — ABNORMAL LOW (ref 12.0–15.0)
Immature Granulocytes: 0 %
Lymphocytes Relative: 17 %
Lymphs Abs: 1.1 K/uL (ref 0.7–4.0)
MCH: 28.4 pg (ref 26.0–34.0)
MCHC: 30.9 g/dL (ref 30.0–36.0)
MCV: 91.9 fL (ref 80.0–100.0)
Monocytes Absolute: 0.4 K/uL (ref 0.1–1.0)
Monocytes Relative: 6 %
Neutro Abs: 4.8 K/uL (ref 1.7–7.7)
Neutrophils Relative %: 76 %
Platelets: 303 K/uL (ref 150–400)
RBC: 2.96 MIL/uL — ABNORMAL LOW (ref 3.87–5.11)
RDW: 15.3 % (ref 11.5–15.5)
Smear Review: NORMAL
WBC: 6.4 K/uL (ref 4.0–10.5)
nRBC: 0 % (ref 0.0–0.2)

## 2024-08-17 MED ORDER — FLUCONAZOLE 100 MG PO TABS
400.0000 mg | ORAL_TABLET | Freq: Once | ORAL | Status: AC
  Administered 2024-08-17: 400 mg via ORAL
  Filled 2024-08-17: qty 4

## 2024-08-17 MED ORDER — NYSTATIN 100000 UNIT/ML MT SUSP
5.0000 mL | Freq: Four times a day (QID) | OROMUCOSAL | Status: DC
  Administered 2024-08-17 – 2024-08-26 (×31): 500000 [IU] via ORAL
  Filled 2024-08-17 (×34): qty 5

## 2024-08-17 MED ORDER — SODIUM CHLORIDE 0.9% FLUSH: 10.0000 mL | INTRAVENOUS | Status: DC | PRN

## 2024-08-17 MED ORDER — FLUCONAZOLE 100 MG PO TABS
200.0000 mg | ORAL_TABLET | Freq: Every day | ORAL | Status: DC
  Administered 2024-08-18 – 2024-08-25 (×8): 200 mg via ORAL
  Filled 2024-08-17 (×8): qty 2

## 2024-08-17 MED ORDER — POLYETHYLENE GLYCOL 3350 17 G PO PACK: 17.0000 g | PACK | Freq: Every day | ORAL | Status: DC | PRN

## 2024-08-17 MED ORDER — SODIUM CHLORIDE 0.9% FLUSH
10.0000 mL | Freq: Two times a day (BID) | INTRAVENOUS | Status: DC
  Administered 2024-08-17 – 2024-08-22 (×9): 10 mL

## 2024-08-17 NOTE — Progress Notes (Signed)
+/-   sleep. Dressing CD&I to distal end of incision, dehisced area. Sutures to peg tube. JP drains with purulent drainage and foul odor. JP drain 2 with 100 plus output every 4 hours. Emptied and recharged every 4 hours. Osmolite 1.5 at 70cc/hr. Able to swallow protonix  with applesauce, but refused chewable mylicon, all other meds given via PEG. LBM 10/2. Was on scheduled imodium  tid, last dose 10/03 @ 0900. Patient concerned about LBM, but more concerned about taking a laxative and not making it to BR. Continent & incontinent of urine. Up to [facility] at 0140. PRN ultram  given at 0139, for abdominal pain. It only hurts when I have to move. Tongue with white coating. Husband at bedside.Jaunita Mikels A

## 2024-08-17 NOTE — Plan of Care (Signed)
  Problem: Consults Goal: RH GENERAL PATIENT EDUCATION Description: See Patient Education module for education specifics. Outcome: Progressing Goal: Nutrition Consult-if indicated Outcome: Progressing Goal: Diabetes Guidelines if Diabetic/Glucose > 140 Description: If diabetic or lab glucose is > 140 mg/dl - Initiate Diabetes/Hyperglycemia Guidelines & Document Interventions  Outcome: Progressing   Problem: RH BOWEL ELIMINATION Goal: RH STG MANAGE BOWEL WITH ASSISTANCE Description: STG Manage Bowel with mod I Assistance. Outcome: Progressing   Problem: RH SAFETY Goal: RH STG ADHERE TO SAFETY PRECAUTIONS W/ASSISTANCE/DEVICE Description: STG Adhere to Safety Precautions With cues Assistance/Device. Outcome: Progressing   Problem: RH PAIN MANAGEMENT Goal: RH STG PAIN MANAGED AT OR BELOW PT'S PAIN GOAL Description: Pain < 4 with prns Outcome: Progressing   Problem: RH KNOWLEDGE DEFICIT GENERAL Goal: RH STG INCREASE KNOWLEDGE OF SELF CARE AFTER HOSPITALIZATION Description: Patient and spouse will be able to manage care at discharge using educational resources independently Outcome: Progressing

## 2024-08-17 NOTE — Progress Notes (Signed)
   Subjective/Chief Complaint: Still has some mild left sided abd pain. Tolerating tube feeds   Objective: Vital signs in last 24 hours: Temp:  [98.6 F (37 C)-98.8 F (37.1 C)] 98.7 F (37.1 C) (10/05 0516) Pulse Rate:  [81-86] 85 (10/05 0516) Resp:  [16-20] 20 (10/05 0516) BP: (129-146)/(56-61) 129/56 (10/05 0516) SpO2:  [96 %-99 %] 99 % (10/05 0516) Weight:  [109.4 kg] 109.4 kg (10/05 0516) Last BM Date : 08/14/24  Intake/Output from previous day: 10/04 0701 - 10/05 0700 In: 2177.5 [P.O.:752; NG/GT:1425.5] Out: 655 [Drains:655] Intake/Output this shift: No intake/output data recorded.  General appearance: alert and cooperative Resp: clear to auscultation bilaterally Cardio: regular rate and rhythm GI: soft, mild tenderness  Lab Results:  Recent Labs    08/16/24 0338 08/17/24 0522  WBC 7.7 6.4  HGB 8.5* 8.4*  HCT 27.7* 27.2*  PLT 345 303   BMET Recent Labs    08/16/24 0338 08/17/24 0522  NA 141 138  K 4.2 4.2  CL 109 106  CO2 22 21*  GLUCOSE 288* 287*  BUN 20 23  CREATININE 1.13* 1.07*  CALCIUM  8.1* 8.2*   PT/INR No results for input(s): LABPROT, INR in the last 72 hours. ABG No results for input(s): PHART, HCO3 in the last 72 hours.  Invalid input(s): PCO2, PO2  Studies/Results: DG Abd 1 View Result Date: 08/15/2024 CLINICAL DATA:  10323 Abdominal distension 10323 EXAM: ABDOMEN - 1 VIEW COMPARISON:  07/27/2024, 07/30/2024 FINDINGS: Interval removal of the esophagogastric tube. Nonobstructive bowel gas pattern.Postsurgical drains in the epigastrium and left mid abdomen.No pneumoperitoneum. No organomegaly or radiopaque calculi. No acute fracture or destructive lesion. Multilevel degenerative disc disease of the spine. IMPRESSION: Nonobstructive bowel gas pattern. Electronically Signed   By: Rogelia Myers M.D.   On: 08/15/2024 21:14    Anti-infectives: Anti-infectives (From admission, onward)    Start     Dose/Rate Route Frequency  Ordered Stop   08/18/24 0800  fluconazole  (DIFLUCAN ) tablet 200 mg        200 mg Oral Daily 08/17/24 0819     08/17/24 0915  fluconazole  (DIFLUCAN ) tablet 400 mg        400 mg Oral  Once 08/17/24 9180         Assessment/Plan: s/p * No surgery found * Advance diet. Continue tube feeds and encourage po's Continue drains.  Wbc normal. Off abx PT/OT  LOS: 2 days    Lauren Gray 08/17/2024

## 2024-08-17 NOTE — Progress Notes (Signed)
 PROGRESS NOTE   Subjective/Complaints:  Pt reports still has L abd pain- around PEG but about the same.   Getting TF's from 2pm t8am and doesn't feel hungry after 1-2 bites - also food tastes bad as well- TF's fill her up   Per nurse, pt has thrush as wlel.  Pt feels like will have BM today- doesn't want oral bowel meds- scared of blow out.    ROS:    Pt denies SOB,  (+) mild-stable-abd pain, CP, N/V/ (+)C/D, and vision changes    Objective:   DG Abd 1 View Result Date: 08/15/2024 CLINICAL DATA:  10323 Abdominal distension 10323 EXAM: ABDOMEN - 1 VIEW COMPARISON:  07/27/2024, 07/30/2024 FINDINGS: Interval removal of the esophagogastric tube. Nonobstructive bowel gas pattern.Postsurgical drains in the epigastrium and left mid abdomen.No pneumoperitoneum. No organomegaly or radiopaque calculi. No acute fracture or destructive lesion. Multilevel degenerative disc disease of the spine. IMPRESSION: Nonobstructive bowel gas pattern. Electronically Signed   By: Rogelia Myers M.D.   On: 08/15/2024 21:14   Recent Labs    08/16/24 0338 08/17/24 0522  WBC 7.7 6.4  HGB 8.5* 8.4*  HCT 27.7* 27.2*  PLT 345 303   Recent Labs    08/16/24 0338 08/17/24 0522  NA 141 138  K 4.2 4.2  CL 109 106  CO2 22 21*  GLUCOSE 288* 287*  BUN 20 23  CREATININE 1.13* 1.07*  CALCIUM  8.1* 8.2*    Intake/Output Summary (Last 24 hours) at 08/17/2024 0926 Last data filed at 08/17/2024 0700 Gross per 24 hour  Intake 2077.5 ml  Output 655 ml  Net 1422.5 ml        Physical Exam: Vital Signs Blood pressure (!) 129/56, pulse 85, temperature 98.7 F (37.1 C), temperature source Oral, resp. rate 20, height 5' 8 (1.727 m), weight 109.4 kg, SpO2 99%.     General: awake, alert, appropriate, sitting up in bed; husband at bedside;  NAD HENT: conjugate gaze; oropharynx very dry- tongue coated with thrush- but also yellowish coating CV:  regular rate and rhythm; no JVD Pulmonary: CTA B/L; no W/R/R- good air movement GI: soft, NT, ND, (+)BS- protuberant; slightly hypoactive Psychiatric: appropriate- slightly flat- and mildly defensive about diet Neurological: Ox3  Skin:  Midline incision covered in clean dressing Drain sites c/d/i         Neuro: AAOx4. No apparent cognitive deficits   DTRs: Reflexes were 2+ in bilateral achilles, patella, biceps, BR and triceps. Babinsky: flexor responses b/l.   Hoffmans: negative b/l Sensory exam: revealed normal sensation in all dermatomal regions in bilateral upper extremities and bilateral lower extremities Motor exam: strength 5-/5 throughout bilateral upper extremities and bilateral lower extremities Coordination: Fine motor coordination was normal.        Assessment/Plan: 1. Functional deficits which require 3+ hours per day of interdisciplinary therapy in a comprehensive inpatient rehab setting. Physiatrist is providing close team supervision and 24 hour management of active medical problems listed below. Physiatrist and rehab team continue to assess barriers to discharge/monitor patient progress toward functional and medical goals  Care Tool:  Bathing    Body parts bathed by patient: Right arm, Left arm, Chest, Abdomen,  Front perineal area, Buttocks, Right upper leg, Left upper leg, Face     Body parts n/a: Right lower leg, Left lower leg   Bathing assist Assist Level: Minimal Assistance - Patient > 75%     Upper Body Dressing/Undressing Upper body dressing   What is the patient wearing?: Pull over shirt    Upper body assist Assist Level: Set up assist    Lower Body Dressing/Undressing Lower body dressing      What is the patient wearing?: Pants, Underwear/pull up     Lower body assist Assist for lower body dressing: Minimal Assistance - Patient > 75% (Min to Mod A for threading clothing item over her feet, hips and bottom.)     Toileting Toileting     Toileting assist Assist for toileting: Minimal Assistance - Patient > 75% (CGA to MinA)     Transfers Chair/bed transfer  Transfers assist  Chair/bed transfer activity did not occur: Safety/medical concerns (2/2 high fatigue)  Chair/bed transfer assist level: Minimal Assistance - Patient > 75% (Mod to MinA)     Locomotion Ambulation   Ambulation assist   Ambulation activity did not occur: Safety/medical concerns (2/2 high fatigue)          Walk 10 feet activity   Assist  Walk 10 feet activity did not occur: Safety/medical concerns (2/2 high fatigue)        Walk 50 feet activity   Assist Walk 50 feet with 2 turns activity did not occur: Safety/medical concerns (2/2 high fatigue)         Walk 150 feet activity   Assist Walk 150 feet activity did not occur: Safety/medical concerns         Walk 10 feet on uneven surface  activity   Assist Walk 10 feet on uneven surfaces activity did not occur: Safety/medical concerns (2/2 high fatigue)         Wheelchair     Assist Is the patient using a wheelchair?: Yes Type of Wheelchair: Manual Wheelchair activity did not occur: Safety/medical concerns (2/2 high fatigue)         Wheelchair 50 feet with 2 turns activity    Assist    Wheelchair 50 feet with 2 turns activity did not occur: Safety/medical concerns (2/2 high fatigue)       Wheelchair 150 feet activity     Assist  Wheelchair 150 feet activity did not occur: Safety/medical concerns (2/2 high fatigue)       Blood pressure (!) 129/56, pulse 85, temperature 98.7 F (37.1 C), temperature source Oral, resp. rate 20, height 5' 8 (1.727 m), weight 109.4 kg, SpO2 99%.   Medical Problem List and Plan: 1. 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  Antithrombotics: -DVT/anticoagulation:  Pharmaceutical: Lovenox              -antiplatelet therapy: N/A 3. Pain Management: Tylenol  prn. Oxycodone  d/c 9/28. Hydrocodone prn.    10/4-10/5 Pain mild 4/10 with therapy- - mainly abd discomfort- con't regimen 4. Mood/Behavior/Sleep: LCSW to follow for evaluation and support.              -  antipsychotic agents: N/A 5. Neuropsych/cognition: This patient is capable of making decisions on her own behalf. 6. Skin/Wound Care: Monitor wound for healing.  7. Fluids/Electrolytes/Nutrition: Monitor I/O. Continue tube feeds 8. Duodenal adenocarcinoma w/gastric outlet obst:  continues on tube feed for nutritional support.  --Advanced to soft diet 10/01 and nausea since yesterday.              --completed 10-13 day course antibiotics for multifocal PNA/pancreatic leak.    9. Abdominal distension:  No BM today and reports discomfort getting worse since lunch.               --KUB ordered - nonobstructive bowel gas -- Will change imodium  to prn as diarrhea has resolved--family feels it's not needed.              --Simethicone added.    10/4- NO BM so far today- KUB shows nonobstructive bowel gas pattern- no major stool- if pain persists or gets worse, surgery agrees with me to get CT of abd/pelvis f/u.   10/5- Surgery doesn't feel like needs CT right now- pt feels will have BM today- refuses PO bowel meds- doesn't want a blow out 10. Fluid overload/Pleural effusions: In part due to third spacing. S/p thoracocentesis.  --Weight 241 at admission-->271-->253 today. Continue daily wts.  --Encourage pulmonary hygiene   11. HTN: Monitor BP TID--on amlodipine , catapres  and coreg              - Add hydralazine  25 mg Q8H PRN for HTN >170 SBP or >100 DBP, given recent highs   10/4-10/5 BP running low 140s- con't regimen 12.  Prediabetes: Hgb A1c- 6.1. Elevated BS due to tube feeds              --continue Lantus  with meal coverage and SSI             --monitor BS ac/hs and use SSI for elevated BS   10/4- BG's running 143 to 304- (283 and 304 early AM)- not sure why- will monitor clinically for 24 hours and then make changes  10/5- Changed Diet to Carb modified soft diet-  also on osmolite- might do better on Glucerna, etc?? 13. Acute on chronic kidney disease: SCr 1.3 at admission and now WNL 14.  Obesity  Class 2: BMI 38.47 15.  ABLA: Recheck CBC in am. Hgb 8-9 range.   10/4- Hb 8.5- was 9.4 but has been 8.7 in last 10 days- will monitor and recheck in AM  10/5- Hb 8.4- not major drop, but will recheck on scheduled days 16. Severe protein malnutrition- Albumin  of 1.5- is declining-   10/5- On TF's changed to 2pm to 6am so can feel hungry- but might need higher dose/faster TF's to compensate during the time she's on; 17. Thrush  10/5- added Nystatin  swish and swallow QID as well as Diflucan  400 mg x1 and 200 mg daily x 6 days- per pt, things taste wrong so I feel this is causing that.    I spent a total of  51  minutes on total care today- >50% coordination of care- due to  D/w pt, PT< nursing and review of chart including surgery, therapy notes, labs, ind read of KUB and ready by rads; and B/B charting as well as vitals    LOS: 2 days A FACE TO FACE EVALUATION WAS PERFORMED  Kayd Launer 08/17/2024, 9:26 AM

## 2024-08-18 DIAGNOSIS — E8809 Other disorders of plasma-protein metabolism, not elsewhere classified: Secondary | ICD-10-CM

## 2024-08-18 DIAGNOSIS — C170 Malignant neoplasm of duodenum: Secondary | ICD-10-CM | POA: Diagnosis not present

## 2024-08-18 DIAGNOSIS — E46 Unspecified protein-calorie malnutrition: Secondary | ICD-10-CM

## 2024-08-18 DIAGNOSIS — R5381 Other malaise: Secondary | ICD-10-CM | POA: Diagnosis not present

## 2024-08-18 DIAGNOSIS — D62 Acute posthemorrhagic anemia: Secondary | ICD-10-CM | POA: Diagnosis not present

## 2024-08-18 LAB — CBC
HCT: 26.9 % — ABNORMAL LOW (ref 36.0–46.0)
Hemoglobin: 8.4 g/dL — ABNORMAL LOW (ref 12.0–15.0)
MCH: 28.6 pg (ref 26.0–34.0)
MCHC: 31.2 g/dL (ref 30.0–36.0)
MCV: 91.5 fL (ref 80.0–100.0)
Platelets: 301 K/uL (ref 150–400)
RBC: 2.94 MIL/uL — ABNORMAL LOW (ref 3.87–5.11)
RDW: 15.1 % (ref 11.5–15.5)
WBC: 6.1 K/uL (ref 4.0–10.5)
nRBC: 0 % (ref 0.0–0.2)

## 2024-08-18 LAB — BASIC METABOLIC PANEL WITH GFR
Anion gap: 12 (ref 5–15)
BUN: 22 mg/dL (ref 8–23)
CO2: 20 mmol/L — ABNORMAL LOW (ref 22–32)
Calcium: 7.9 mg/dL — ABNORMAL LOW (ref 8.9–10.3)
Chloride: 105 mmol/L (ref 98–111)
Creatinine, Ser: 1.02 mg/dL — ABNORMAL HIGH (ref 0.44–1.00)
GFR, Estimated: 58 mL/min — ABNORMAL LOW (ref >60)
Glucose, Bld: 286 mg/dL — ABNORMAL HIGH (ref 70–99)
Potassium: 4 mmol/L (ref 3.5–5.1)
Sodium: 137 mmol/L (ref 135–145)

## 2024-08-18 LAB — GLUCOSE, CAPILLARY
Glucose-Capillary: 280 mg/dL — ABNORMAL HIGH (ref 70–99)
Glucose-Capillary: 140 mg/dL — ABNORMAL HIGH (ref 70–99)
Glucose-Capillary: 226 mg/dL — ABNORMAL HIGH (ref 70–99)
Glucose-Capillary: 264 mg/dL — ABNORMAL HIGH (ref 70–99)

## 2024-08-18 MED ORDER — INFLUENZA VAC SPLIT HIGH-DOSE 0.5 ML IM SUSY
0.5000 mL | PREFILLED_SYRINGE | INTRAMUSCULAR | Status: DC
  Filled 2024-08-18: qty 0.5

## 2024-08-18 MED ORDER — OSMOLITE 1.5 CAL PO LIQD
1260.0000 mL | ORAL | Status: DC
  Administered 2024-08-18 – 2024-08-20 (×3): 1260 mL

## 2024-08-18 NOTE — IPOC Note (Signed)
 Overall Plan of Care [facility]) Patient Details Name: Lauren Gray MRN: [id number] DOB: 07-07-1951  Admitting Diagnosis: Debility  Hospital Problems: Principal Problem:   Debility     Functional Problem List: Nursing Bowel, Safety, Endurance, Medication Management, Pain, Nutrition, Behavior  PT Balance  OT Balance, Endurance, Pain  SLP    TR         Basic ADL's: OT Bathing, Dressing, Toileting     Advanced  ADL's: OT Simple Meal Preparation, Light Housekeeping     Transfers: PT Bed Mobility, Bed to Chair, Car  OT Tub/Shower     Locomotion: PT Ambulation, Stairs     Additional Impairments: OT    SLP        TR      Anticipated Outcomes Item Anticipated Outcome  Self Feeding ModI  Swallowing      Basic self-care  Modi  Toileting  ModI   Bathroom Transfers ModI  Bowel/Bladder  manage bowel w mod I assist  Transfers  Supervision  Locomotion  Supervision + LRAD  Communication     Cognition     Pain  Pain < 4 with prns  Safety/Judgment  manage safety w cues   Therapy Plan: PT Intensity: Minimum of 1-2 x/day ,45 to 90 minutes PT Frequency: 5 out of 7 days PT Duration Estimated Length of Stay: 10-14 days OT Intensity: Minimum of 1-2 x/day, 45 to 90 minutes OT Frequency: 5 out of 7 days OT Duration/Estimated Length of Stay: 12-14 days     Team Interventions: Nursing Interventions Medication Management, Discharge Planning, Bowel Management, Disease Management/Prevention, Pain Management, Patient/Family Education  PT interventions Ambulation/gait training, Warden/ranger, Community reintegration, Discharge planning, Disease management/prevention, Cognitive remediation/compensation, DME/adaptive equipment instruction, Functional mobility training, Neuromuscular re-education, Pain management, Patient/family education, Psychosocial support, Skin care/wound management, Splinting/orthotics, Stair training, Therapeutic Activities, Therapeutic  Exercise, UE/LE Strength taining/ROM, UE/LE Coordination activities, Wheelchair propulsion/positioning  OT Interventions Warden/ranger, Self Care/advanced ADL retraining, Therapeutic Exercise, UE/LE Strength taining/ROM, Patient/family education, DME/adaptive equipment instruction, UE/LE Coordination activities, Functional mobility training, Therapeutic Activities  SLP Interventions    TR Interventions    SW/CM Interventions Discharge Planning, Psychosocial Support, Patient/Family Education   Barriers to Discharge MD  Medical stability and Nutritional means  Nursing Decreased caregiver support, Other (comments) (medical drain management) multi level 5 ste bil rails w spouse; Independent without AD, drives, works remote for [employer]  PT Wound Care    OT      SLP      SW Decreased caregiver support     Team Discharge Planning: Destination: PT-Home ,OT- Home , SLP-  Projected Follow-up: PT-Outpatient PT, Home health PT (TBD home health vs outpatient PT), OT-  Other (comment) (TBD), SLP-  Projected Equipment Needs: PT-To be determined, Rolling walker with 5 wheels, OT- To be determined, SLP-  Equipment Details: PT-RW at home is tall version for husband, needs RW, OT-  Patient/family involved in discharge planning: PT- Family member/caregiver, Patient,  OT-Patient, Family member/caregiver, SLP-   MD ELOS: 10-14d Medical Rehab Prognosis:  Good Assessment: The patient has been admitted for CIR therapies with the diagnosis of debility. The team will be addressing functional mobility, strength, stamina, balance, safety, adaptive techniques and equipment, self-care, bowel and bladder mgt, patient and caregiver education, tube feeding management , nutrition, transition to po . Goals have been set at Mod I. Anticipated discharge destination is Home .     See Team Conference Notes for weekly updates to the plan of care

## 2024-08-18 NOTE — Progress Notes (Signed)
 Occupational Therapy Session Note  Patient Details  Name: Lauren Gray MRN: [id number] Date of Birth: [birth year]  Today's Date: 08/18/2024 OT Individual Time: 1347-1500 OT Individual Time Calculation (min): 73 min    Short Term Goals: Week 1:  OT Short Term Goal 1 (Week 1): The pt safely transfer to all surface with MinA using AE as needed. OT Short Term Goal 2 (Week 1): The pt will bathe/dress LB safely with MinA using AE as needed.. OT Short Term Goal 3 (Week 1): The pt will safely participate in > 30 minutes of Occupational Therapy  activity with minimal rest breaks following demonstration and initial cues. OT Short Term Goal 4 (Week 1): The pt will safely demonstrate a simple homemaking task at [facility] after demonstration and initial cues.  Skilled Therapeutic Interventions/Progress Updates:     Pt received resting in bed with DTR present in room. Pt presenting to be tired and hesitant to participate in therapy session d/t fatigue. Provided maximal therapeutic support with Pt then receptive to skilled OT session reporting 3/10 pain in stomach- OT offering intermittent rest breaks, repositioning, and therapeutic support to optimize participation in therapy session. Pain did increase to 5/10 with bed mobility. Pt transitioned to EOB with HOB partially elevated with MIN A to lift trunk and bring B LEs fully to EOB +increased time. RN in/out to empty drains at beginning of session. Re-educated Pt on utilizing reacher to weave B LEs into pants, however Pt declining to practice during session reporting I want to learn to do it on my own . Pt weaved B LEs into pants with MOD A d/t abdominal pain and stood to RW to bring pants to waist- heavy MIN A provided for power-up and standing balance. Stand pivot to wc using RW MIN A for balance and verbal cues for encouragement. Pt extremely fatigued following with prolonged seated rest break provided. Transported Pt to outdoor location to support improved  moral, psychosocial reintegration, and to allow for change in environment. While outdoors, Pt mildly emotional expressing psychosocial impact of prolonged hospitalization, not being able to participate in her job, and impact of being less independent at this time. Utilized therapeutic use of self and motivational interviewing to encourage Pt, facilitating positive discussion focusing on Pt's strengths and progress. Improvement in moral noted following. Pt transported back to room total A in wc for time management and energy conservation. Stand pivot wc > EOB using WC MIN A using RW +verbal cues for techniques. Doffed pants from waist with MIN A for standing balance and MAX A required to unweave feet d/t fatigue. Doffed [place] shirt SUP and donned hospital gown MIN A. EOB > supine light MOD A to bring B LEs into bed. Pt was left resting in air bed with call bell in reach, bed alarm on, and all needs met.    Therapy Documentation Precautions:  Precautions Precautions: Fall, Other (comment) Precaution/Restrictions Comments: JP drainx2 (L), PEG tube Restrictions Weight Bearing Restrictions Per Provider Order: No   Therapy/Group: Individual Therapy  Lauren Gray 08/18/2024, 3:08 PM

## 2024-08-18 NOTE — Progress Notes (Signed)
 Occupational Therapy Session Note  Patient Details  Name: Lauren Gray MRN: [id number] Date of Birth: 09-14-1951  Today's Date: 08/18/2024 OT Individual Time: 9057-8952 OT Individual Time Calculation (min): 65 min    Short Term Goals: Week 1:  OT Short Term Goal 1 (Week 1): The pt safely transfer to all surface with MinA using AE as needed. OT Short Term Goal 2 (Week 1): The pt will bathe/dress LB safely with MinA using AE as needed.. OT Short Term Goal 3 (Week 1): The pt will safely participate in > 30 minutes of Occupational Therapy  activity with minimal rest breaks following demonstration and initial cues. OT Short Term Goal 4 (Week 1): The pt will safely demonstrate a simple homemaking task at [facility] after demonstration and initial cues.  Skilled Therapeutic Interventions/Progress Updates:    Pt received resting in bed presenting to be in good spirits receptive to skilled OT session reporting some pain with no numerical value- OT offering intermittent rest breaks, repositioning, and therapeutic support to optimize participation in therapy session. Supine>EOB with MOD A to bring trunk up and MOD cues for hand placement to prevent pain with bed mobility and transfer. Completed U/LB dressing with MIN A for weaving feet into his pants and bra clasp management and extended time. Stand pivot EOB>WC with MIN A for balance and safety. Transported Pt to rehab gym via [facility] [place] d/t time management. Pt completed visual scanning activity on BITS with 97.83 % accuracy, duration of 2 mins, 2.86 seconds reaction time, and 45 hits. Pt reported no dizziness during activity. Pt completed horizontal visual pursuit activity by tapping a square on the right side of the screen, fixating on circle, then tapping it again on the L side before exiting the screen. Completed visual activity to gain better understanding of dizziness with visual stimuli. Graded up activity to visual pursuits spinning in circular motion with order  from 1-30. Pt completed 15 with MIN questioning cues for recall of sequence and Pt reported headache, stopped activity and headache subsided. Will continue to assess dizziness with visual stimuli. Transported Pt back to room via WC d/t time management. Pt was left resting in WC with call bell in reach, husband present, and all needs met.    Therapy Documentation Precautions:  Precautions Precautions: Fall, Other (comment) Precaution/Restrictions Comments: JP drainx2 (L), PEG tube Restrictions Weight Bearing Restrictions Per Provider Order: No   Therapy/Group: Individual Therapy  [name] 08/18/2024, 12:41 PM

## 2024-08-18 NOTE — Progress Notes (Signed)
 Inpatient Rehabilitation Care Coordinator Assessment and Plan Patient Details  Name: Lauren Gray MRN: [id number] Date of Birth: 01/28/1951  Today's Date: 08/18/2024  Hospital Problems: Principal Problem:   Debility  Past Medical History:  Past Medical History:  Diagnosis Date   Complication of anesthesia    Obesity    Scoliosis    Past Surgical History:  Past Surgical History:  Procedure Laterality Date   ABDOMINAL HYSTERECTOMY     TVH   BONE BIOPSY  07/06/2024   Procedure: BIOPSY, GI;  Surgeon: Elicia Claw, MD;  Location: [facility];  Service: Gastroenterology;;   ENTEROSCOPY N/A 07/06/2024   Procedure: ENTEROSCOPY;  Surgeon: Elicia Claw, MD;  Location: [facility];  Service: Gastroenterology;  Laterality: N/A;   JEJUNOSTOMY N/A 07/22/2024   Procedure: CREATION, JEJUNOSTOMY;  Surgeon: Aron Shoulders, MD;  Location: [facility] OR;  Service: General;  Laterality: N/A;   PELVIC LAPAROSCOPY  04/06/2006   BSO   WHIPPLE PROCEDURE N/A 07/22/2024   Procedure: WHIPPLE PROCEDURE;  Surgeon: Aron Shoulders, MD;  Location: [facility] OR;  Service: General;  Laterality: N/A;   Social History:  reports that she has never smoked. She has never used smokeless tobacco. She reports that she does not currently use alcohol. She reports that she does not use drugs.  Family / Support Systems Marital Status: Married Patient Roles: Spouse, Parent, Other (Comment) (employee) Spouse/Significant Other: Teacher, English as a foreign language [phone] Children: Katina-daughtrer [phone] also has a 37 yo grandson Other Supports: Friends and sister who is supportive Anticipated Caregiver: Husband and daughter Ability/Limitations of Caregiver: Husband is retired but also disabled and daughter works. Pt hopes to be mod/i level before she goes home Caregiver Availability: 24/7 (between both of them) Family Dynamics: Close knit with family and friends who will check on her and make sure her needs are met.  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 Husband is present and provieds support   Abuse/Neglect Abuse/Neglect Assessment Can Be Completed: Yes Physical Abuse: Denies Verbal Abuse: Denies Sexual Abuse: Denies Exploitation of patient/patient's resources: Denies Self-Neglect: Denies  Patient response to: Social Isolation - How often do you feel lonely or isolated from those around you?: Never  Emotional Status Pt's affect, behavior and adjustment status: Pt is motivated to do well here and recover she is very weak and realizes it will take time to recover her strength. She is a very independent person and does not like to ask for assist from others. She is motivated to do well and do as much as she can for herself before she leaves. Recent Psychosocial Issues: other health issues has been dealing with her N & V issues for the past month. Glad the MD have done something about it Psychiatric History: hx-anxiety has been worse since being in the hospital and not as in control as she would like to be. Will place on neuro-psych list to be seen while here Substance Abuse History: NA  Patient / Family Perceptions, Expectations & Goals Pt/Family understanding of illness & functional limitations: Pt and  husband can explain her surgery and issues for being in the hospital. Both are hopeful she will do well and get stronger for any other treatments she may need. Premorbid pt/family  roles/activities: wife,mom, grandmother, employee, siblng, church member, etc Anticipated changes in roles/activities/participation: resume Pt/family expectations/goals: Pt states:  I hope to do well and regain my strength I want to be independent again. Husband states;  She is very independent and an active person.  Community [employer] Agencies: None Premorbid Home Care/DME Agencies: Other (Comment) (rw, cane grab bars) Transportation available at discharge: self and husband Is the patient able to respond to transportation needs?: Yes In the past 12 months, has lack of transportation kept you from medical appointments or from getting medications?: No In the past 12 months, has lack of transportation kept you from meetings, work, or from getting things needed for daily living?: No Resource referrals recommended: Neuropsychology  Discharge Planning Living Arrangements: Spouse/significant other, Children Support Systems: Spouse/significant other, Children, Other relatives, Friends/neighbors, Church/faith community Type of Residence: Private residence Insurance Resources: Media planner (specify) Haematologist Health) Financial Resources: Employment, Family Support Financial Screen Referred: No Living Expenses: Own Money Management: Spouse, Patient Does the patient have any problems obtaining your medications?: No Home Management: both husband and pt Patient/Family Preliminary Plans: Return home with husband who is retired but also disabled. he can assist some and their daughter lives with them also but does work during the day. Looks like goals are supervision-mod/i level. Will update once team conference on Wed Care Coordinator Barriers to Discharge: Decreased caregiver support Care Coordinator Anticipated Follow Up Needs: HH/OP  Clinical Impression Pleasant female who is motivated and ready to do reach but struggles at times due to how weak she is. Her husband is present  and supportive. Aware team conference on Wed will update and work on discharge needs.  Raymonde Asberry MATSU 08/18/2024, 10:05 AM

## 2024-08-18 NOTE — Progress Notes (Signed)
 Inpatient Rehabilitation  Patient information reviewed and entered into eRehab system by Burnard Mealing, OTR/L, Rehab Quality Coordinator.   Information including medical coding, functional ability and quality indicators will be reviewed and updated through discharge.

## 2024-08-18 NOTE — Progress Notes (Signed)
 Inpatient Rehabilitation Center Individual Statement of Services  Patient Name:  Lauren Gray  Date:  08/18/2024  Welcome to the Inpatient Rehabilitation Center.  Our goal is to provide you with an individualized program based on your diagnosis and situation, designed to meet your specific needs.  With this comprehensive rehabilitation program, you will be expected to participate in at least 3 hours of rehabilitation therapies Monday-Friday, with modified therapy programming on the weekends.  Your rehabilitation program will include the following services:  Physical Therapy (PT), Occupational Therapy (OT), 24 hour per day rehabilitation nursing, Therapeutic Recreaction (TR), Neuropsychology, Care Coordinator, Rehabilitation Medicine, Nutrition Services, and Pharmacy Services  Weekly team conferences will be held on Wednesday to discuss your progress.  Your Inpatient Rehabilitation Care Coordinator will talk with you frequently to get your input and to update you on team discussions.  Team conferences with you and your family in attendance may also be held.  Expected length of stay: 12-14 days  Overall anticipated outcome: supervision-mod/I level  Depending on your progress and recovery, your program may change. Your Inpatient Rehabilitation Care Coordinator will coordinate services and will keep you informed of any changes. Your Inpatient Rehabilitation Care Coordinator's name and contact numbers are listed  below.  The following services may also be recommended but are not provided by the Inpatient Rehabilitation Center:  Driving Evaluations Home Health Rehabiltiation Services Outpatient Rehabilitation Services Vocational Rehabilitation   Arrangements will be made to provide these services after discharge if needed.  Arrangements include referral to agencies that provide these services.  Your insurance has been verified to be:  [facility] Your primary doctor is:  Lauren  Gray  Pertinent information will be shared with your doctor and your insurance company.  Inpatient Rehabilitation Care Coordinator:  Rhoda Clement, KEN [phone] or Lauren Gray  Information discussed with and copy given to patient by: Clement Asberry MATSU, 08/18/2024, 9:40 AM

## 2024-08-18 NOTE — Progress Notes (Signed)
 PROGRESS NOTE   Subjective/Complaints:     ROS:    Pt denies SOB,  (+) mild-stable-abd pain, CP, N/V/ (+)C/D, and vision changes    Objective:   No results found.  Recent Labs    08/17/24 0522 08/18/24 0430  WBC 6.4 6.1  HGB 8.4* 8.4*  HCT 27.2* 26.9*  PLT 303 301   Recent Labs    08/17/24 0522 08/18/24 0430  NA 138 137  K 4.2 4.0  CL 106 105  CO2 21* 20*  GLUCOSE 287* 286*  BUN 23 22  CREATININE 1.07* 1.02*  CALCIUM  8.2* 7.9*    Intake/Output Summary (Last 24 hours) at 08/18/2024 0855 Last data filed at 08/18/2024 0724 Gross per 24 hour  Intake 1765.17 ml  Output 1070 ml  Net 695.17 ml        Physical Exam: Vital Signs Blood pressure (!) 146/62, pulse 88, temperature 98.8 F (37.1 C), temperature source Oral, resp. rate 19, height 5' 8 (1.727 m), weight 113 kg, SpO2 96%.  General: No acute distress Mood and affect are appropriate Heart: Regular rate and rhythm no rubs murmurs or extra sounds Lungs: Clear to auscultation, breathing unlabored, no rales or wheezes Abdomen: Positive bowel sounds, soft nontender to palpation, nondistended RLQ drains, LLQ J tube Extremities: No clubbing, cyanosis, or edema    Skin:  Midline incision covered in clean dressing Drain sites c/d/I- unchanged 10/6         Neuro: AAOx4. No apparent cognitive deficits    Sensory exam: revealed normal sensation in all dermatomal regions in bilateral upper extremities and bilateral lower extremities Motor exam: strength 5-/5 throughout bilateral upper extremities and bilateral lower extremities Coordination: Fine motor coordination was normal.        Assessment/Plan: 1. Functional deficits which require 3+ hours per day of interdisciplinary therapy in a comprehensive inpatient rehab setting. Physiatrist is providing close team supervision and 24 hour management of active medical problems listed  below. Physiatrist and rehab team continue to assess barriers to discharge/monitor patient progress toward functional and medical goals  Care Tool:  Bathing    Body parts bathed by patient: Right arm, Left arm, Chest, Abdomen, Front perineal area, Buttocks, Right upper leg, Left upper leg, Face     Body parts n/a: Right lower leg, Left lower leg   Bathing assist Assist Level: Minimal Assistance - Patient > 75%     Upper Body Dressing/Undressing Upper body dressing   What is the patient wearing?: Pull over shirt    Upper body assist Assist Level: Set up assist    Lower Body Dressing/Undressing Lower body dressing      What is the patient wearing?: Pants, Underwear/pull up     Lower body assist Assist for lower body dressing: Minimal Assistance - Patient > 75% (Min to Mod A for threading clothing item over her feet, hips and bottom.)     Toileting Toileting    Toileting assist Assist for toileting: Minimal Assistance - Patient > 75% (CGA to MinA)     Transfers Chair/bed transfer  Transfers assist  Chair/bed transfer activity did not occur: Safety/medical concerns (2/2 high fatigue)  Chair/bed transfer assist level: Minimal  Assistance - Patient > 75% (Mod to MinA)     Locomotion Ambulation   Ambulation assist   Ambulation activity did not occur: Safety/medical concerns (2/2 high fatigue)          Walk 10 feet activity   Assist  Walk 10 feet activity did not occur: Safety/medical concerns (2/2 high fatigue)        Walk 50 feet activity   Assist Walk 50 feet with 2 turns activity did not occur: Safety/medical concerns (2/2 high fatigue)         Walk 150 feet activity   Assist Walk 150 feet activity did not occur: Safety/medical concerns         Walk 10 feet on uneven surface  activity   Assist Walk 10 feet on uneven surfaces activity did not occur: Safety/medical concerns (2/2 high fatigue)         Wheelchair     Assist Is  the patient using a wheelchair?: Yes Type of Wheelchair: Manual Wheelchair activity did not occur: Safety/medical concerns (2/2 high fatigue)         Wheelchair 50 feet with 2 turns activity    Assist    Wheelchair 50 feet with 2 turns activity did not occur: Safety/medical concerns (2/2 high fatigue)       Wheelchair 150 feet activity     Assist  Wheelchair 150 feet activity did not occur: Safety/medical concerns (2/2 high fatigue)       Blood pressure (!) 146/62, pulse 88, temperature 98.8 F (37.1 C), temperature source Oral, resp. rate 19, height 5' 8 (1.727 m), weight 113 kg, SpO2 96%.   Medical Problem List and Plan: 1. 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  Antithrombotics: -DVT/anticoagulation:  Pharmaceutical: Lovenox              -antiplatelet therapy: N/A 3. Pain Management: Tylenol  prn. Oxycodone  d/c 9/28. Hydrocodone prn.    10/4-10/5 Pain mild 4/10 with therapy- - mainly abd discomfort- con't regimen 4. Mood/Behavior/Sleep: LCSW to follow for evaluation and support.              -antipsychotic agents: N/A 5. Neuropsych/cognition: This patient is capable of making decisions on her own behalf. 6. Skin/Wound Care: Monitor wound for healing.  7. Fluids/Electrolytes/Nutrition: Monitor I/O. Continue tube feeds 8. Duodenal adenocarcinoma w/gastric outlet obst:  continues on tube feed for nutritional support.  --Advanced to soft diet 10/01 and nausea since yesterday.              --completed 10-13 day course antibiotics for multifocal PNA/pancreatic leak.    9. Abdominal distension:  No BM today and reports discomfort getting worse since lunch.               --KUB ordered - nonobstructive bowel gas --  Will change imodium  to prn as diarrhea has resolved--family feels it's not needed.              --Simethicone added.      10/5- Surgery doesn't feel like needs CT right now- pt feels will have BM today- refuses  PO bowel meds- doesn't want a blow out Large cont type 5 stool today  10. Fluid overload/Pleural effusions: In part due to third spacing. S/p thoracocentesis.  --Weight 241 at admission-->271-->253 today. Continue daily wts.  --Encourage pulmonary hygiene   11. HTN: Monitor BP TID--on amlodipine , catapres  and coreg              - Add hydralazine  25 mg Q8H PRN for HTN >170 SBP or >100 DBP, given recent highs   10/4-10/5 BP running low 140s- con't regimen Vitals:   08/17/24 2022 08/18/24 0531  BP: (!) 149/56 (!) 146/62  Pulse: 85 88  Resp: 18 19  Temp: 99.1 F (37.3 C) 98.8 F (37.1 C)  SpO2: 94% 96%   Systolic HTN, fair control , may need to increase coreg or catapres  12.  Prediabetes: Hgb A1c- 6.1. Elevated BS due to tube feeds             --continue Lantus  with meal coverage and SSI             --monitor BS ac/hs and use SSI for elevated BS   10/4- BG's running 143 to 304- (283 and 304 early AM)- not sure why- will monitor clinically for 24 hours and then make changes  10/5- Changed Diet to Carb modified soft diet-  also on osmolite- might do better on Glucerna, etc?? 13. Acute on chronic kidney disease: SCr 1.3 at admission and now WNL 14.  Obesity  Class 2: BMI 38.47 15.  ABLA: Recheck CBC in am. Hgb 8-9 range.   10/4- Hb 8.5- was 9.4 but has been 8.7 in last 10 days- will monitor and recheck in AM  10/5- Hb 8.4- not major drop, but will recheck on scheduled days 16. Severe protein malnutrition- J tube placed 9/9 Albumin  of 1.5- is declining-   10/5- On TF's changed to 2pm to 6am so can feel hungry- but might need higher dose/faster TF's to compensate during the time she's on; 17. Thrush  10/5- added Nystatin  swish and swallow QID as well as Diflucan  400 mg x1 and 200  mg daily x 6 days- per pt, things taste wrong so I feel this is causing that.       LOS: 3 days A FACE TO FACE EVALUATION WAS PERFORMED  Prentice FORBES Compton 08/18/2024, 8:55 AM

## 2024-08-18 NOTE — Progress Notes (Signed)
 N&V this morning when trying to take chewable mylicon tab. Unable to tolerate the chalky texture. Lauren Gray

## 2024-08-18 NOTE — Progress Notes (Signed)
 Physical Therapy Session Note  Patient Details  Name: Lauren Gray MRN: [id number] Date of Birth: 11-Aug-1951  Today's Date: 08/18/2024 PT Individual Time: 1119-1203 PT Individual Time Calculation (min): 44 min   Short Term Goals: Week 1:  PT Short Term Goal 1 (Week 1): Pt will complete bed mobility using CGA PT Short Term Goal 2 (Week 1): Pt will complete SPT to chair/WC using CGA + LRAD PT Short Term Goal 3 (Week 1): Pt will amb 47ft using CGA + LRAD PT Short Term Goal 4 (Week 1): Pt will initiate stair training Week 2:     Skilled Therapeutic Interventions/Progress Updates:  Patient *** on entrance to room. Patient alert and agreeable to PT session.   Patient with no pain complaint at start of session.  Therapeutic Activity: Bed Mobility: Pt performed supine <> sit with ***. VC/ tc required for ***. Transfers: Pt performed sit<>stand and stand pivot transfers throughout session with ***. Provided vc/ tc for***.  Gait Training:  Pt ambulated *** ft using *** with ***. Demonstrated ***. Provided vc/ tc for ***.  Wheelchair Mobility:  Pt propelled wheelchair *** feet with ***. Provided vc/ tc for ***.  Neuromuscular Re-ed: NMR facilitated during session with focus on ***. Pt guided in ***. NMR performed for improvements in motor control and coordination, balance, sequencing, judgement, and self confidence/ efficacy in performing all aspects of mobility at highest level of independence.   Therapeutic Exercise: Pt performed the following exercises with vc/ tc for proper technique. ***  Patient *** at end of session with brakes locked, *** alarm set, and all needs within reach.   Therapy Documentation Precautions:  Precautions Precautions: Fall, Other (comment) Precaution/Restrictions Comments: JP drainx2 (L), PEG tube Restrictions Weight Bearing Restrictions Per Provider Order: No  Pain:      Therapy/Group: Individual Therapy  Mliss DELENA Milliner PT, DPT,  CSRS 08/18/2024, 6:27 PM

## 2024-08-18 NOTE — Progress Notes (Signed)
   Subjective/Chief Complaint: Tolerating a bit more PO intake.     Objective: Vital signs in last 24 hours: Temp:  [98.8 F (37.1 C)-99.1 F (37.3 C)] 98.8 F (37.1 C) (10/06 0531) Pulse Rate:  [84-88] 88 (10/06 0531) Resp:  [17-19] 19 (10/06 0531) BP: (146-149)/(54-62) 146/62 (10/06 0531) SpO2:  [94 %-98 %] 96 % (10/06 0531) Weight:  [886 kg] 113 kg (10/06 0500) Last BM Date : 08/14/24  Intake/Output from previous day: 10/05 0701 - 10/06 0700 In: 1647.2 [P.O.:240; NG/GT:1407.2] Out: 1070 [Urine:350; Drains:720] Intake/Output this shift: Total I/O In: 118 [P.O.:118] Out: -   General:  alert and oriented. CV regular Pulm:  breathing comfortably Abd:  sl distended.   Right sided drains purulent c/w panc fluid.   Ext:  warm, well perfused.   Lab Results:  Recent Labs    08/17/24 0522 08/18/24 0430  WBC 6.4 6.1  HGB 8.4* 8.4*  HCT 27.2* 26.9*  PLT 303 301   BMET Recent Labs    08/17/24 0522 08/18/24 0430  NA 138 137  K 4.2 4.0  CL 106 105  CO2 21* 20*  GLUCOSE 287* 286*  BUN 23 22  CREATININE 1.07* 1.02*  CALCIUM  8.2* 7.9*   PT/INR No results for input(s): LABPROT, INR in the last 72 hours. ABG No results for input(s): PHART, HCO3 in the last 72 hours.  Invalid input(s): PCO2, PO2  Studies/Results: No results found.  Anti-infectives: Anti-infectives (From admission, onward)    Start     Dose/Rate Route Frequency Ordered Stop   08/18/24 0800  fluconazole  (DIFLUCAN ) tablet 200 mg        200 mg Oral Daily 08/17/24 0819     08/17/24 1000  fluconazole  (DIFLUCAN ) tablet 400 mg        400 mg Oral  Once 08/17/24 0819 08/17/24 1002       Assessment/Plan: s/p Whipple,  jejunostomy tube Dr. 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  18 h tube feed cycle for full calorie intake.  Go down a bit on amount to increase hunger.   Calorie  counts.   - Completed abx 9/27. No current indication for abx. Monitor. Afebrile with nl WBC - On fiber. Prn loperamide .  Continue and monitor BM's - Continue pain regimen. - Continue PT/OT. - Cont drains. Drain output increased.      FEN: Soft diet as tolerated.  VTE: SCDs, SQH ID: Vanc/zosyn /diflucan  discontinued 9/27. WBCs wnl on last check. Afebrile.       LOS: 3 days    Jina LITTIE Aron, MD, FACS, FSSO Surgical Oncology, General Surgery, Trauma and Critical [facility], [place] [phone] for weekday/non holidays Check [email or web address] for coverage night/weekend/holidays

## 2024-08-18 NOTE — Progress Notes (Signed)
 Physical Therapy Session Note  Patient Details  Name: Lauren Gray MRN: [id number] Date of Birth: [birth year]  Today's Date: 08/18/2024 PT Missed Time: 30 Minutes Missed Time Reason: Patient fatigue;Pain  Short Term Goals: Week 1:  PT Short Term Goal 1 (Week 1): Pt will complete bed mobility using CGA PT Short Term Goal 2 (Week 1): Pt will complete SPT to chair/WC using CGA + LRAD PT Short Term Goal 3 (Week 1): Pt will amb 35ft using CGA + LRAD PT Short Term Goal 4 (Week 1): Pt will initiate stair training Week 2:     Skilled Therapeutic Interventions/Progress Updates:      Therapy Documentation Precautions:  Precautions Precautions: Fall, Other (comment) Precaution/Restrictions Comments: JP drainx2 (L), PEG tube Restrictions Weight Bearing Restrictions Per Provider Order: No General: PT Amount of Missed Time (min): 30 Minutes PT Missed Treatment Reason: Patient fatigue;Pain Vital Signs:   Pain:   Mobility:   Locomotion :    Trunk/Postural Assessment :    Balance:   Exercises:   Other Treatments:      Therapy/Group: Individual Therapy  Mliss DELENA Milliner PT, DPT, CSRS 08/18/2024, 6:29 PM

## 2024-08-18 NOTE — Progress Notes (Signed)
 Restless night. Reports throwing up at 1800. PRN compazine  10mg 's given at 0057 for complaint of nausea. At 2400, felt like she was incontinent of stool, nothing in bed or brief. At 0330, requested bedpan, Large soft formed BM. JP drain #2, drains approximately 100cc's every 3 hours, more milky looking. JP drain #1 with minimal drainage. Bilateral heels elevated off bed with pillows. Dressing in place at distal end of abdominal incision, dehisced area. SCD's applied at beditme. Osmolite at 70cc/hr. Husband at bedside. Denia Mcvicar A

## 2024-08-19 ENCOUNTER — Inpatient Hospital Stay (HOSPITAL_COMMUNITY)

## 2024-08-19 LAB — GLUCOSE, CAPILLARY
Glucose-Capillary: 343 mg/dL — ABNORMAL HIGH (ref 70–99)
Glucose-Capillary: 123 mg/dL — ABNORMAL HIGH (ref 70–99)
Glucose-Capillary: 208 mg/dL — ABNORMAL HIGH (ref 70–99)
Glucose-Capillary: 257 mg/dL — ABNORMAL HIGH (ref 70–99)

## 2024-08-19 MED ORDER — MEGESTROL ACETATE 400 MG/10ML PO SUSP
400.0000 mg | Freq: Every day | ORAL | Status: DC
  Administered 2024-08-19 – 2024-09-04 (×17): 400 mg via ORAL
  Filled 2024-08-19 (×17): qty 10

## 2024-08-19 NOTE — Progress Notes (Signed)
 Physical Therapy Session Note  Patient Details  Name: Lauren Gray MRN: [id number] Date of Birth: 1951/06/17  Today's Date: 08/19/2024 PT Individual Time: 9195-9084 PT Individual Time Calculation (min): 71 min   Short Term Goals: Week 1:  PT Short Term Goal 1 (Week 1): Pt will complete bed mobility using CGA PT Short Term Goal 2 (Week 1): Pt will complete SPT to chair/WC using CGA + LRAD PT Short Term Goal 3 (Week 1): Pt will amb 67ft using CGA + LRAD PT Short Term Goal 4 (Week 1): Pt will initiate stair training  Skilled Therapeutic Interventions/Progress Updates:  Patient supine in bed on entrance to room. Patient alert and agreeable to PT session. Husband present but leaves at start of therapy session.   Patient with unrated pain complaint in abdomen at start of session. RN present to provide morning meds. Pt had episode of emesis earlier in morning following oral meds. Current meds provided through PEG tube. During med administration, discussion with pt re: schedule for day (which is kept in binder far from patient), primary therapy team taking over care with majority of session with Dom PTA and Julia OT. Determined plan for attempting to perform bed mobility with this therapist, ambulation to bathroom with Dom in following session and dressing with Recardo during OT session in sequence this morning.   Following medication admin, MD arrives for morning rounding. Pt complains of continuing pain in abdomen. On exam, MD determines air present in abdomen and will order imaging.   Therapeutic Activity: Bed Mobility: Pt performed supine > sit with increased effort and pain in abdomen but is able to complete with SBA with increased time to setup and perform. VC/ tc required for intermittent technique. Uses partial roll to side and then is able to use BUE to assist with bringing UB up to seated position on EOB. Once seated upright, pt relates need to toilet now.  Transfers: Pt performed sit<>stand  and stand pivot, and toilet transfers this session with MinAfor power up as well as for controlling descent to sit. Provided vc/ tc for positioning. While seated on toilet, pt continent of bowel and then has loose stool. Requires extra time to complete toileting. Requires Max/ TotA for pericare.   While in bathroom, transport tech arrives to take pt to imaging. Pt then transported back to w/c from toilet with minA, then back to bed with MinA. And returns to supine with slow movements and MinA for BLE. Requires time and overall ModA for positioning to comfort. Readied for transport.   Patient supine in bed at end of session and care passed to transport tech.    Therapy Documentation Precautions:  Precautions Precautions: Fall, Other (comment) Precaution/Restrictions Comments: JP drainx2 (L), PEG tube Restrictions Weight Bearing Restrictions Per Provider Order: No  Pain:  Significant pain in abdomen this morning. Educated on need to continue with therapy at scheduled times to improve overall strength and progress toward d/c home and improved independence.    Therapy/Group: Individual Therapy  Mliss DELENA Milliner PT, DPT, CSRS 08/19/2024, 6:34 PM

## 2024-08-19 NOTE — Progress Notes (Signed)
 Physical Therapy Session Note  Patient Details  Name: Lauren Gray MRN: [id number] Date of Birth: 1951/11/07  Today's Date: 08/19/2024 PT Individual Time: 1310-1405 PT Individual Time Calculation (min): 55 min   Short Term Goals: Week 1:  PT Short Term Goal 1 (Week 1): Pt will complete bed mobility using CGA PT Short Term Goal 2 (Week 1): Pt will complete SPT to chair/WC using CGA + LRAD PT Short Term Goal 3 (Week 1): Pt will amb 63ft using CGA + LRAD PT Short Term Goal 4 (Week 1): Pt will initiate stair training  Pt missed 45 min of skilled therapy due to being off unit for X-ray. Will re-attempt as schedule and pt availability permits.  SESSION 2 Skilled Therapeutic Interventions/Progress Updates: Patient sitting in [facility] with husband present on entrance to room. Patient alert and agreeable to PT session.   Patient reported unrated pain in lower abdomen around surgical site (premedicated). Pt required increased encouragement throughout session due to feelings of low confidence and pain and global weakness. PTA stated to pt that in order to progress to get home safely with as little of assistance as possible pt would have to begin ambulating further and pushing self to fully participate in therapy (along with letting medical team know if medications aren't assisting with pain). Pt transported outside to increase pt morale and to ambulate on noncompliant surface. Pt in RW and requested quick rest to get self ready to walk. PTA continued with building pt and pt family rapport about LOA pt's husband can provide. Pt husband stated he has had 3 back surgeries and has to walk with a cane. PTA discussed biggest challenge as of now is pushing through to increase endurance as pt home set up has at least 5 steps to enter home, and another set of stairs 5/6 that has a landing to get to 2nd floor. Pt stood to RW x 2 with minA and ambulated roughly 10' x 1 and 18' x 1 with CGA and WC follow for safety. Pt  required seated rest breaks due to fatigue and max cuing to maintain safe RW proximity. Pt transported back to day room gym and cued to perform VOR habituation exercise with pt requiring max multimodal cuing to increase ROM of cervical rotation with pt stating I can't move my head more it makes me dizzy. PTA further emphasized rationale originally given that these exercises will trigger dizziness, but will allow pt to habituate to sudden movements that trigger dizziness. Pt continued with cue to only perform cervical rotation to understand sequence, followed by adding visual track of target. Pt reported 5-6/10 dizziness with PTA stated to keep going until 7. Pt continued to have difficulty performing (difficult to assess if pt was challenged by movement, or mentally not wanting to participate). Pt then cued to perform VOR cancellation with cue for sequence but pt continued to report the same despite encouragement. Pt transported back to room in [facility].   Patient sitting in WC at end of session with brakes locked, husband present, and all needs within reach.      Therapy Documentation Precautions:  Precautions Precautions: Fall, Other (comment) Precaution/Restrictions Comments: JP drainx2 (L), PEG tube Restrictions Weight Bearing Restrictions Per Provider Order: No  Therapy/Group: Individual Therapy  Tien Spooner PTA  08/19/2024, 4:00 PM

## 2024-08-19 NOTE — Progress Notes (Signed)
 Subjective/Chief Complaint: Had some nausea and vomiting, but only threw up a little.  Was also having some LUQ pain.  Xray did not show any gastric air but did show a bit of ileus.  Patient passing a lot of gas and having formed bowel movements.    Was outside today.     Objective: Vital signs in last 24 hours: Temp:  [98.6 F (37 C)-98.9 F (37.2 C)] 98.9 F (37.2 C) (10/07 1421) Pulse Rate:  [81-85] 81 (10/07 1421) Resp:  [18-19] 18 (10/07 1421) BP: (133-150)/(57-62) 133/58 (10/07 1421) SpO2:  [90 %-100 %] 98 % (10/07 1421) Weight:  [113.9 kg] 113.9 kg (10/07 0602) Last BM Date : 08/18/24  Intake/Output from previous day: 10/06 0701 - 10/07 0700 In: 858 [P.O.:558; NG/GT:300] Out: 435 [Drains:435] Intake/Output this shift: Total I/O In: 190 [P.O.:90; NG/GT:100] Out: 60 [Drains:60]  General:  alert and oriented. CV regular Pulm:  breathing comfortably Abd:  sl distended.   Right sided drains purulent c/w panc fluid.   Ext:  warm, well perfused.   Lab Results:  Recent Labs    08/17/24 0522 08/18/24 0430  WBC 6.4 6.1  HGB 8.4* 8.4*  HCT 27.2* 26.9*  PLT 303 301   BMET Recent Labs    08/17/24 0522 08/18/24 0430  NA 138 137  K 4.2 4.0  CL 106 105  CO2 21* 20*  GLUCOSE 287* 286*  BUN 23 22  CREATININE 1.07* 1.02*  CALCIUM  8.2* 7.9*   PT/INR No results for input(s): LABPROT, INR in the last 72 hours. ABG No results for input(s): PHART, HCO3 in the last 72 hours.  Invalid input(s): PCO2, PO2  Studies/Results: DG Abd 1 View Result Date: 08/19/2024 CLINICAL DATA:  Epigastric abdominal discomfort.  Postop Whipple. EXAM: ABDOMEN - 1 VIEW COMPARISON:  Radiograph 08/15/2024 FINDINGS: Multiple drains project over the right and left abdomen. Scattered air within prominent small bowel, measuring up to 3.2 cm in the left abdomen. Minimal formed stool in the colon. Surgical clips in the upper abdomen. IMPRESSION: Scattered air within prominent  small bowel, measuring up to 3.2 cm in the left abdomen, favor postoperative ileus. Multiple drains remain in place. Electronically Signed   By: Andrea Gasman M.D.   On: 08/19/2024 14:58    Anti-infectives: Anti-infectives (From admission, onward)    Start     Dose/Rate Route Frequency Ordered Stop   08/18/24 0800  fluconazole  (DIFLUCAN ) tablet 200 mg        200 mg Oral Daily 08/17/24 0819     08/17/24 1000  fluconazole  (DIFLUCAN ) tablet 400 mg        400 mg Oral  Once 08/17/24 0819 08/17/24 1002       Assessment/Plan: s/p Whipple,  jejunostomy tube Dr. 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  18 h tube feed cycle for full calorie intake.  Go down a bit on amount to increase hunger.   Calorie counts.  Add megace for appetite stimulant.   - Completed abx 9/27. No current indication for abx. Monitor. Afebrile with nl WBC. Recheck WBCs tomorrow.  - On fiber. Prn loperamide .  Continue and monitor BM's - Continue pain regimen. - Continue PT/OT. - Cont drains. Drain output increased.  Continue to follow.      FEN: Soft diet as tolerated.  VTE: SCDs, SQH ID: Vanc/zosyn /diflucan  discontinued 9/27. WBCs wnl on last check. Afebrile.     If patient  develops fever, has trend of increasing white count, or develops continued n/v, will get CT scan.    Dispo - main concerns are nutrition and therapy tolerance.      LOS: 4 days    Jina LITTIE Nephew, MD, FACS, FSSO Surgical Oncology, General Surgery, Trauma and [facility], [place] [phone] for weekday/non holidays Check [email or web address] for coverage night/weekend/holidays

## 2024-08-19 NOTE — Progress Notes (Signed)
 Occupational Therapy Session Note  Patient Details  Name: Lauren Gray MRN: [id number] Date of Birth: Sep 23, 1951  Today's Date: 08/19/2024 OT Individual Time: 1020-1115 OT Individual Time Calculation (min): 55 min    Short Term Goals: Week 1:  OT Short Term Goal 1 (Week 1): The pt safely transfer to all surface with MinA using AE as needed. OT Short Term Goal 2 (Week 1): The pt will bathe/dress LB safely with MinA using AE as needed.. OT Short Term Goal 3 (Week 1): The pt will safely participate in > 30 minutes of Occupational Therapy  activity with minimal rest breaks following demonstration and initial cues. OT Short Term Goal 4 (Week 1): The pt will safely demonstrate a simple homemaking task at [facility] after demonstration and initial cues.  Skilled Therapeutic Interventions/Progress Updates:    Pt received in bed with spouse in the room.  Pt initially difficult to get started as she was very focused on me being a new therapist and she wanted to keep working with the other therapist and her student as the were so young and gave her energy.  I was finally able to get her to focus and sit to EOB.  I explained that I wanted to see how she was doing with her self care as she was wearing a gown.  Pt seemed irritated that I did not know that she could dress and bathe herself. I explained that is not what her OT notes had been documenting.  The documentation reflected min -mod A with self care and mobility.   Pt did transfer to w/c with supervision.  Pt c/o of back of w/c being uncomfortable but the pillows kept falling out due to low chair back.  Obtained a taller chair back which she found more comfortable.   Pt declined need to toilet or bathe as nursing had assisted with that earlier.   She was able to don shirt without A, but did need some A managing pants over feet and donning socks. She was then able to slip on her shoes, stand and pull her pants up with S without using the RW.    Due to  improved mobility, had the rehab tech come to the room to have pt practice ambulation.  She took 6 steps with RW and then became fatigued.    Pt taken to gym but felt dizzy so she was cued to use visual fixation to focus her eyes.  In gym, pt using 2 lb dowel bar for shoulder and chest presses and rows. She fatigued after 8-9 reps  at a time and needed strong encouragement to do 10 reps.  Pt stated well what I want is for someone to do resistance training with me so I can get stronger.  I asked pt what she was doing right now and she said 'resistance training.  I clarified that is what she is doing.  If she is getting fatigued with 2 lbs that is enough weight for now.  Pt returned to room and opted to stay in wc.  Pt in room with spouse and all needs met.   Therapy Documentation Precautions:  Precautions Precautions: Fall, Other (comment) Precaution/Restrictions Comments: JP drainx2 (L), PEG tube Restrictions Weight Bearing Restrictions Per Provider Order: No   Pain:  C/o abdominal soreness, nursing aware ADL: ADL Equipment Provided: Reacher Eating: Other (comment) (feeding tube) Where Assessed-Eating: Bed level Grooming: Setup Where Assessed-Grooming: Sitting at sink Upper Body Bathing: Setup Where Assessed-Upper Body Bathing: Sitting at  sink Lower Body Bathing: Moderate assistance, Minimal assistance Where Assessed-Lower Body Bathing: Edge of bed Upper Body Dressing: Setup Where Assessed-Upper Body Dressing: Edge of bed Lower Body Dressing: Moderate assistance Where Assessed-Lower Body Dressing: Edge of bed Toileting: Minimal assistance, Moderate assistance (based on performance with bathing at sink LOF) Where Assessed-Toileting: Other (Comment) Toilet Transfer: Minimal assistance, Moderate assistance Toilet Transfer Method: Ambulating Toilet Transfer Equipment: Extra wide bedside commode, Grab bars (based on coming from sit to stand at bed and sink LOF) Tub/Shower Transfer:  Minimal assistance, Moderate assistance Tub/Shower Transfer Method: Ship broker: Shower seat with back, Walk in Music therapist: Minimal assistance, Moderate assistance Film/video editor Method:  (based on patient's ability to come from sit to stand and ambulate along side the bed) [employer]: Shower seat with back      Therapy/Group: Individual Therapy  Valton Schwartz 08/19/2024, 12:37 PM

## 2024-08-19 NOTE — Progress Notes (Signed)
 PROGRESS NOTE   Subjective/Complaints:  LUQ abd pain has had for ~2wks  Last KUB 10/3 large amt of gastric air No vomiting, + freq burping also with flatus  Had large cont BM yesterday  ROS:  Pt denies SOB,  (+) mild-stable-abd pain, CP, N/V/ (+)C/D, and vision changes    Objective:   No results found.  Recent Labs    08/17/24 0522 08/18/24 0430  WBC 6.4 6.1  HGB 8.4* 8.4*  HCT 27.2* 26.9*  PLT 303 301   Recent Labs    08/17/24 0522 08/18/24 0430  NA 138 137  K 4.2 4.0  CL 106 105  CO2 21* 20*  GLUCOSE 287* 286*  BUN 23 22  CREATININE 1.07* 1.02*  CALCIUM  8.2* 7.9*    Intake/Output Summary (Last 24 hours) at 08/19/2024 0828 Last data filed at 08/19/2024 0602 Gross per 24 hour  Intake 740 ml  Output 435 ml  Net 305 ml        Physical Exam: Vital Signs Blood pressure (!) 150/57, pulse 84, temperature 98.9 F (37.2 C), temperature source Oral, resp. rate 19, height 5' 8 (1.727 m), weight 113.9 kg, SpO2 90%.  General: No acute distress Mood and affect are appropriate Heart: Regular rate and rhythm no rubs murmurs or extra sounds Lungs: Clear to auscultation, breathing unlabored, no rales or wheezes Abdomen: Positive bowel sounds, soft nontender to palpation, mild LUQ distention with tenderness no tenderness or drainage around tubes , midline granulating incision site unchanged  RLQ drains, LLQ J tube Extremities: No clubbing, cyanosis, or edema    Skin:  Midline incision covered in clean dressing Drain sites c/d/I- unchanged 10/6         Neuro: AAOx4. No apparent cognitive deficits    Sensory exam: revealed normal sensation in all dermatomal regions in bilateral upper extremities and bilateral lower extremities Motor exam: strength 5-/5 throughout bilateral upper extremities and bilateral lower extremities Coordination: Fine motor coordination was normal.        Assessment/Plan: 1.  Functional deficits which require 3+ hours per day of interdisciplinary therapy in a comprehensive inpatient rehab setting. Physiatrist is providing close team supervision and 24 hour management of active medical problems listed below. Physiatrist and rehab team continue to assess barriers to discharge/monitor patient progress toward functional and medical goals  Care Tool:  Bathing    Body parts bathed by patient: Right arm, Left arm, Chest, Abdomen, Front perineal area, Buttocks, Right upper leg, Left upper leg, Face     Body parts n/a: Right lower leg, Left lower leg   Bathing assist Assist Level: Minimal Assistance - Patient > 75%     Upper Body Dressing/Undressing Upper body dressing   What is the patient wearing?: Pull over shirt    Upper body assist Assist Level: Set up assist    Lower Body Dressing/Undressing Lower body dressing      What is the patient wearing?: Pants, Underwear/pull up     Lower body assist Assist for lower body dressing: Minimal Assistance - Patient > 75% (Min to Mod A for threading clothing item over her feet, hips and bottom.)     Toileting Toileting  Toileting assist Assist for toileting: Minimal Assistance - Patient > 75% (CGA to MinA)     Transfers Chair/bed transfer  Transfers assist  Chair/bed transfer activity did not occur: Safety/medical concerns (2/2 high fatigue)  Chair/bed transfer assist level: Minimal Assistance - Patient > 75% (Mod to MinA)     Locomotion Ambulation   Ambulation assist   Ambulation activity did not occur: Safety/medical concerns (2/2 high fatigue)          Walk 10 feet activity   Assist  Walk 10 feet activity did not occur: Safety/medical concerns (2/2 high fatigue)        Walk 50 feet activity   Assist Walk 50 feet with 2 turns activity did not occur: Safety/medical concerns (2/2 high fatigue)         Walk 150 feet activity   Assist Walk 150 feet activity did not occur:  Safety/medical concerns         Walk 10 feet on uneven surface  activity   Assist Walk 10 feet on uneven surfaces activity did not occur: Safety/medical concerns (2/2 high fatigue)         Wheelchair     Assist Is the patient using a wheelchair?: Yes Type of Wheelchair: Manual Wheelchair activity did not occur: Safety/medical concerns (2/2 high fatigue)         Wheelchair 50 feet with 2 turns activity    Assist    Wheelchair 50 feet with 2 turns activity did not occur: Safety/medical concerns (2/2 high fatigue)       Wheelchair 150 feet activity     Assist  Wheelchair 150 feet activity did not occur: Safety/medical concerns (2/2 high fatigue)       Blood pressure (!) 150/57, pulse 84, temperature 98.9 F (37.2 C), temperature source Oral, resp. rate 19, height 5' 8 (1.727 m), weight 113.9 kg, SpO2 90%.   Medical Problem List and Plan: 1. 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  Antithrombotics: -DVT/anticoagulation:  Pharmaceutical: Lovenox              -antiplatelet therapy: N/A 3. Pain Management: Tylenol  prn. Oxycodone  d/c 9/28. Hydrocodone prn.    10/4-10/5 Pain mild 4/10 with therapy- - mainly abd discomfort- con't regimen 4. Mood/Behavior/Sleep: LCSW to follow for evaluation and support.              -antipsychotic agents: N/A 5. Neuropsych/cognition: This patient is capable of making decisions on her own behalf. 6. Skin/Wound Care: Monitor wound for healing.  7. Fluids/Electrolytes/Nutrition: Monitor I/O. Continue tube feeds 8. Duodenal adenocarcinoma w/gastric outlet obst:  continues on tube feed for nutritional support.  --Advanced to soft diet 10/01 and nausea since yesterday.              --completed 10-13 day course  antibiotics for multifocal PNA/pancreatic leak.    9. Abdominal distension:  No BM today and reports discomfort getting worse since lunch.               --KUB ordered - nonobstructive bowel gas -- Will change imodium  to prn as diarrhea has resolved--family feels it's not needed.              --  Simethicone added.      10/5- Surgery doesn't feel like needs CT right now- pt feels will have BM today- refuses PO bowel meds- doesn't want a blow out Large cont type 5 stool today  10. Fluid overload/Pleural effusions: In part due to third spacing. S/p thoracocentesis.  --Weight 241 at admission-->271-->253 today. Continue daily wts.  --Encourage pulmonary hygiene   11. HTN: Monitor BP TID--on amlodipine , catapres  and coreg              - Add hydralazine  25 mg Q8H PRN for HTN >170 SBP or >100 DBP, given recent highs   10/4-10/5 BP running low 140s- con't regimen Vitals:   08/18/24 2053 08/19/24 0602  BP: (!) 145/62 (!) 150/57  Pulse: 85 84  Resp: 18 19  Temp: 98.6 F (37 C) 98.9 F (37.2 C)  SpO2: 100% 90%   Systolic HTN, fair control , may need to increase coreg or catapres  12.  Prediabetes: Hgb A1c- 6.1. Elevated BS due to tube feeds             --continue Lantus  with meal coverage and SSI             --monitor BS ac/hs and use SSI for elevated BS   10/4- BG's running 143 to 304- (283 and 304 early AM)- not sure why- will monitor clinically for 24 hours and then make changes  10/5- Changed Diet to Carb modified soft diet-  also on osmolite- might do better on Glucerna, etc?? 13. Acute on chronic kidney disease: SCr 1.3 at admission and now WNL 14.  Obesity  Class 2: BMI 38.47 15.  ABLA: Recheck CBC in am. Hgb 8-9 range.   stable    Latest Ref Rng & Units 08/18/2024    4:30 AM 08/17/2024    5:22 AM 08/16/2024    3:38 AM  CBC  WBC 4.0 - 10.5 K/uL 6.1  6.4  7.7   Hemoglobin 12.0 - 15.0 g/dL 8.4  8.4  8.5   Hematocrit 36.0 - 46.0 % 26.9  27.2  27.7   Platelets 150 - 400 K/uL 301  303   345     16. Severe protein malnutrition- J tube placed 9/9 Albumin  of 1.5- is declining-   10/5- On TF's changed to 2pm to 6am so can feel hungry- but might need higher dose/faster TF's to compensate during the time she's on; 17. Thrush  10/5- added Nystatin  swish and swallow QID as well as Diflucan  400 mg x1 and 200 mg daily x 6 days- per pt, things taste wrong so I feel this is causing that.  Ask dietary to follow oral vs enteral intake      LOS: 4 days A FACE TO FACE EVALUATION WAS PERFORMED  Lauren Gray 08/19/2024, 8:28 AM

## 2024-08-20 DIAGNOSIS — F4321 Adjustment disorder with depressed mood: Secondary | ICD-10-CM

## 2024-08-20 LAB — CBC
HCT: 29 % — ABNORMAL LOW (ref 36.0–46.0)
Hemoglobin: 8.9 g/dL — ABNORMAL LOW (ref 12.0–15.0)
MCH: 27.6 pg (ref 26.0–34.0)
MCHC: 30.7 g/dL (ref 30.0–36.0)
MCV: 90.1 fL (ref 80.0–100.0)
Platelets: 329 K/uL (ref 150–400)
RBC: 3.22 MIL/uL — ABNORMAL LOW (ref 3.87–5.11)
RDW: 15.3 % (ref 11.5–15.5)
WBC: 6.7 K/uL (ref 4.0–10.5)
nRBC: 0 % (ref 0.0–0.2)

## 2024-08-20 LAB — GLUCOSE, CAPILLARY
Glucose-Capillary: 287 mg/dL — ABNORMAL HIGH (ref 70–99)
Glucose-Capillary: 189 mg/dL — ABNORMAL HIGH (ref 70–99)
Glucose-Capillary: 224 mg/dL — ABNORMAL HIGH (ref 70–99)
Glucose-Capillary: 277 mg/dL — ABNORMAL HIGH (ref 70–99)

## 2024-08-20 MED ORDER — INSULIN GLARGINE 100 UNIT/ML ~~LOC~~ SOLN
12.0000 [IU] | Freq: Two times a day (BID) | SUBCUTANEOUS | Status: DC
  Administered 2024-08-20: 12 [IU] via SUBCUTANEOUS
  Filled 2024-08-20 (×3): qty 0.12

## 2024-08-20 NOTE — Progress Notes (Signed)
 Physical Therapy Session Note  Patient Details  Name: Lauren Gray MRN: [id number] Date of Birth: [birth year]  Today's Date: 08/20/2024 PT Individual Time: 1310-1355 PT Individual Time Calculation (min): 45 min   Short Term Goals: Week 1:  PT Short Term Goal 1 (Week 1): Pt will complete bed mobility using CGA PT Short Term Goal 2 (Week 1): Pt will complete SPT to chair/WC using CGA + LRAD PT Short Term Goal 3 (Week 1): Pt will amb 22ft using CGA + LRAD PT Short Term Goal 4 (Week 1): Pt will initiate stair training  Skilled Therapeutic Interventions/Progress Updates: Patient sitting in [facility] following personal care with nsg on entrance to room. Patient alert and agreeable to PT session.   Patient reported unrated pain (ongoing) and premedicated. Pt adamant on not getting out of room due to increase in diarrhea but willing to do therapy in room close to bathroom. Pt performed 3 x close to fatigue of B LAQ with 5lb ankle weights donned, and then 3 x close to fatigue seated hip flexion with target cue to touch PTA hand with knee to improve ROM. B hip abduction seated with yellow theraband and cues to increase ROM and control eccentric (3 x close to fatigue). B knee flexion with yellow theraband with cues to also increase hip flexion to clear heel from hitting floor with yellow theraband (3 x close to fatigue).  Patient sitting in WC at end of session with brakes locked, hand off to neuropsych and all needs within reach.      Therapy Documentation Precautions:  Precautions Precautions: Fall, Other (comment) Precaution/Restrictions Comments: JP drainx2 (L), PEG tube Restrictions Weight Bearing Restrictions Per Provider Order: No  Therapy/Group: Individual Therapy  Breezie Micucci PTA 08/20/2024, 2:16 PM

## 2024-08-20 NOTE — Progress Notes (Signed)
 PROGRESS NOTE   Subjective/Complaints:  Appreciate Gen Surg note  ROS:  Pt denies SOB,  (+) mild-stable-abd pain, CP, N/V/ (+)C/D, and vision changes    Objective:   DG Abd 1 View Result Date: 08/19/2024 CLINICAL DATA:  Epigastric abdominal discomfort.  Postop Whipple. EXAM: ABDOMEN - 1 VIEW COMPARISON:  Radiograph 08/15/2024 FINDINGS: Multiple drains project over the right and left abdomen. Scattered air within prominent small bowel, measuring up to 3.2 cm in the left abdomen. Minimal formed stool in the colon. Surgical clips in the upper abdomen. IMPRESSION: Scattered air within prominent small bowel, measuring up to 3.2 cm in the left abdomen, favor postoperative ileus. Multiple drains remain in place. Electronically Signed   By: Andrea Gasman M.D.   On: 08/19/2024 14:58    Recent Labs    08/18/24 0430 08/20/24 0426  WBC 6.1 6.7  HGB 8.4* 8.9*  HCT 26.9* 29.0*  PLT 301 329   Recent Labs    08/18/24 0430  NA 137  K 4.0  CL 105  CO2 20*  GLUCOSE 286*  BUN 22  CREATININE 1.02*  CALCIUM  7.9*    Intake/Output Summary (Last 24 hours) at 08/20/2024 0824 Last data filed at 08/20/2024 0650 Gross per 24 hour  Intake 310 ml  Output 510 ml  Net -200 ml        Physical Exam: Vital Signs Blood pressure (!) 128/53, pulse 90, temperature 98.5 F (36.9 C), temperature source Oral, resp. rate 18, height 5' 8 (1.727 m), weight 113.9 kg, SpO2 100%.  General: No acute distress Mood and affect are appropriate Heart: Regular rate and rhythm no rubs murmurs or extra sounds Lungs: Clear to auscultation, breathing unlabored, no rales or wheezes Abdomen: Positive bowel sounds, soft nontender to palpation, mild LUQ distention with tenderness no tenderness or drainage around tubes , midline granulating incision site unchanged  RLQ drains, LLQ J tube Extremities: No clubbing, cyanosis, or edema    Skin:  Midline  incision covered in clean dressing Drain sites c/d/I- unchanged 10/6         Neuro: AAOx4. No apparent cognitive deficits    Sensory exam: revealed normal sensation in all dermatomal regions in bilateral upper extremities and bilateral lower extremities Motor exam: strength 5-/5 throughout bilateral upper extremities and bilateral lower extremities Coordination: Fine motor coordination was normal.        Assessment/Plan: 1. Functional deficits which require 3+ hours per day of interdisciplinary therapy in a comprehensive inpatient rehab setting. Physiatrist is providing close team supervision and 24 hour management of active medical problems listed below. Physiatrist and rehab team continue to assess barriers to discharge/monitor patient progress toward functional and medical goals  Care Tool:  Bathing    Body parts bathed by patient: Right arm, Left arm, Chest, Abdomen, Front perineal area, Buttocks, Right upper leg, Left upper leg, Face     Body parts n/a: Right lower leg, Left lower leg   Bathing assist Assist Level: Minimal Assistance - Patient > 75%     Upper Body Dressing/Undressing Upper body dressing   What is the patient wearing?: Pull over shirt    Upper body assist Assist Level: Set up  assist    Lower Body Dressing/Undressing Lower body dressing      What is the patient wearing?: Pants, Underwear/pull up     Lower body assist Assist for lower body dressing: Moderate Assistance - Patient 50 - 74%     Toileting Toileting    Toileting assist Assist for toileting: Maximal Assistance - Patient 25 - 49%     Transfers Chair/bed transfer  Transfers assist  Chair/bed transfer activity did not occur: Safety/medical concerns (2/2 high fatigue)  Chair/bed transfer assist level: Supervision/Verbal cueing     Locomotion Ambulation   Ambulation assist   Ambulation activity did not occur: Safety/medical concerns (2/2 high fatigue)          Walk 10  feet activity   Assist  Walk 10 feet activity did not occur: Safety/medical concerns (2/2 high fatigue)        Walk 50 feet activity   Assist Walk 50 feet with 2 turns activity did not occur: Safety/medical concerns (2/2 high fatigue)         Walk 150 feet activity   Assist Walk 150 feet activity did not occur: Safety/medical concerns         Walk 10 feet on uneven surface  activity   Assist Walk 10 feet on uneven surfaces activity did not occur: Safety/medical concerns (2/2 high fatigue)         Wheelchair     Assist Is the patient using a wheelchair?: Yes Type of Wheelchair: Manual Wheelchair activity did not occur: Safety/medical concerns (2/2 high fatigue)         Wheelchair 50 feet with 2 turns activity    Assist    Wheelchair 50 feet with 2 turns activity did not occur: Safety/medical concerns (2/2 high fatigue)       Wheelchair 150 feet activity     Assist  Wheelchair 150 feet activity did not occur: Safety/medical concerns (2/2 high fatigue)       Blood pressure (!) 128/53, pulse 90, temperature 98.5 F (36.9 C), temperature source Oral, resp. rate 18, height 5' 8 (1.727 m), weight 113.9 kg, SpO2 100%.   Medical Problem List and Plan: 1. 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 Formulized individual treatment plan based on medical history, underlying problem and comorbidities.   Con't CIR PT and OT 2.  Antithrombotics: -DVT/anticoagulation:  Pharmaceutical: Lovenox              -antiplatelet therapy: N/A 3. Pain Management: Tylenol  prn. Oxycodone  d/c 9/28. Hydrocodone prn.    10/4-10/5 Pain mild 4/10 with therapy- -  mainly abd discomfort- con't regimen 4. Mood/Behavior/Sleep: LCSW to follow for evaluation and support.              -antipsychotic agents: N/A 5. Neuropsych/cognition: This patient is capable of making decisions on her own behalf. 6. Skin/Wound Care: Monitor wound for healing.  7. Fluids/Electrolytes/Nutrition: Monitor I/O. Continue tube feeds 8. Duodenal adenocarcinoma w/gastric outlet obst:  continues on tube feed for nutritional support.  Improved motility passing gas but may have mild ileus per Gen surg, tolerated po vegetables and potatoes yesterday   9. Abdominal distension:  improve motility lass stomach gas, some mild ileus noted per Gen surg Not using hydrocodone and only 50mg  tramadol  per day     Large cont type 5 stool today  10. Fluid overload/Pleural effusions: In part due to third spacing. S/p thoracocentesis.  --Weight 241 at admission-->271-->253 today. Continue daily wts.  --Encourage pulmonary hygiene   11. HTN: Monitor BP TID--on amlodipine , catapres  and coreg              - Add hydralazine  25 mg Q8H PRN for HTN >170 SBP or >100 DBP, given recent highs   10/4-10/5 BP running low 140s- con't regimen Vitals:   08/19/24 2110 08/20/24 0450  BP: (!) 124/52 (!) 128/53  Pulse:  90  Resp:  18  Temp:  98.5 F (36.9 C)  SpO2:  100%   Systolic HTN, fair control , may need to increase coreg or catapres  12.  Prediabetes: Hgb A1c- 6.1. Elevated BS due to tube feeds             --continue Lantus  with meal coverage and SSI             --monitor BS ac/hs and use SSI for elevated BS   CBG (last 3)  Recent Labs    08/19/24 1745 08/19/24 2127 08/20/24 0640  GLUCAP 208* 257* 287*  Increase lantus  10/8  13. Acute on chronic kidney disease: SCr 1.3 at admission and now WNL 14.  Obesity  Class 2: BMI 38.47 15.  ABLA: Recheck CBC in am. Hgb 8-9 range.   stable    Latest Ref Rng & Units 08/20/2024    4:26 AM 08/18/2024    4:30 AM 08/17/2024    5:22 AM  CBC  WBC 4.0 - 10.5  K/uL 6.7  6.1  6.4   Hemoglobin 12.0 - 15.0 g/dL 8.9  8.4  8.4   Hematocrit 36.0 - 46.0 % 29.0  26.9  27.2   Platelets 150 - 400 K/uL 329  301  303     16. Severe protein malnutrition- J tube placed 9/9 Albumin  of 1.5- is declining-   10/5- On TF's changed to 2pm to 6am so can feel hungry- but might need higher dose/faster TF's to compensate during the time she's on; 17. Thrush  10/5- added Nystatin  swish and swallow QID as well as Diflucan  400 mg x1 and 200 mg daily x 6 days- per pt, things taste wrong so I feel this is causing that.  Ask dietary to follow oral vs enteral intake      LOS: 5 days A FACE TO FACE EVALUATION WAS PERFORMED  Prentice FORBES Compton 08/20/2024, 8:24 AM

## 2024-08-20 NOTE — Group Note (Signed)
 Patient Details Name: Lauren Gray MRN: [id number] DOB: 02/04/1951 Today's Date: 08/20/2024  Time Calculation:   PT Group Time Calculation PT Group Start Time: 0935 PT Group Stop Time: 1030 PT Group Time Calculation (min): 55 min    Group Description: BUE Therex Group: Pt participated in group session with a focus on BUE strength and endurance to facilitate improved activity tolerance and strength for higher level BADLs and functional mobility tasks.   Individual level documentation: Pt completed seated bicep curls, shoulder flexion/extension, forward punches, and upright rows with 1lb and AROM. Patient able to complete to tolerance up to 15 reps.  During rest breaks discussed energy conservation and importance of gaining endurance tolerance through movement Pt transported back to room with dependent. Pt left in w/c with all needs within reach and bed alarm/chair alarm activated.   Pain:  Pt denies pain throughout session. Endorses increased fatigue during session  Precautions:  None   Harl Wiechmann 08/20/2024, 3:10 PM

## 2024-08-20 NOTE — Consult Note (Signed)
 Neuropsychological Consultation Comprehensive Inpatient Rehab   Patient:   Lauren Gray   DOB:   1951-07-07  MR Number:  [id number]  Location:  [facility] [address] [facility] [address] [place] [address] Dept: [phone] Loc: [phone]           Date of Service:   08/20/2024  Start Time:   2 PM End Time:   3 PM  Provider/Observer:  Norleen Asa, Psy.D.       Clinical Neuropsychologist       Billing Code/Service: [phone]  Reason for Service:    Lauren Gray is a 73 year old female referred for neuropsychological consultation during admission to the comprehensive inpatient rehabilitation unit for supportive intervention and assessment of coping.  History of Present Illness: Admitted 07/04/2024 for workup of intermittent nausea, vomiting, constipation, and a 10-pound weight loss. CT of abdomen and pelvis revealed a small bowel obstruction, likely from an underlying duodenal mass. Small bowel endoscopy with biopsy was positive for adenocarcinoma. A Whipple procedure with placement of a pancreatic duct stent and jejunostomy tube was performed on 07/22/2024. Post-operatively, has experienced significant functional decline, fatigue, weakness, and dizziness, leading to a recommendation for comprehensive inpatient rehabilitation.  Mental Status / Behavioral Observations: Appears to be coping with the significant medical changes and prolonged hospitalization. Reports difficulty with focus and concentration, which is attributed to fatigue. Mood appears appropriate to the situation. Acknowledges the difficulty of the situation but is trying to be more future-oriented although the extended time she has been in a bed/medically ill make it hard for her to anticipate how much recovery she could make to return to more active participation in day-to-day activities.  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   Hypokalemia 07/04/2024   Constipation 07/04/2024   Elevated lipase 07/04/2024   Prediabetes 06/18/2024   Acid indigestion 06/18/2024   Hyperlipidemia 06/18/2024   Annual physical exam 02/09/2023   Obesity 02/09/2023   Chronic pain of right knee 02/09/2023   Weight gain 05/25/2017   Family history of uterine cancer 05/04/2017   Groin pain 06/11/2015   Family history of breast cancer in female 02/17/2013   H/O vitamin D  deficiency 02/17/2013   Family history of diabetes mellitus 02/17/2013    Behavioral Observation/Mental Status:   Lauren Gray  presents as a 73 y.o.-year-old Right handed African American Female who appeared her stated age. her dress was Appropriate and she was Well Groomed and her manners were Appropriate to the situation.  her participation was indicative of Appropriate behaviors.  There were physical disabilities noted.  she displayed an appropriate level of cooperation and motivation.    Interactions:    Active Appropriate and Drowsy  Attention:   abnormal and attention span appeared shorter than expected for age  Memory:   abnormal; remote memory intact, recent memory impaired  Visuo-spatial:   not examined  Speech (Volume):  low  Speech:   normal; normal  Thought Process:  Coherent and Relevant  Coherent and Tangential  Though Content:  WNL; not suicidal and not homicidal  Orientation:   person,  place, and situation  Judgment:   Fair  Planning:   Fair  Affect:    Blunted, Flat, and Lethargic  Mood:    Dysphoric  Insight:   Fair  Intelligence:   normal  Impression/DX:   Patient is experiencing an adjustment reaction related to a new cancer diagnosis, major  surgery, and significant deconditioning. Difficulties with attention and concentration appear secondary to fatigue and medical acuity rather than a primary neurocognitive disorder. Patient is a good candidate for inpatient rehabilitation and is expected to benefit from the services. Prognosis for functional recovery is positive, as evidenced by her acceptance to the rehabilitation unit.  Disposition/Plan:  1.  Provided psychoeducation regarding the common cognitive and emotional responses to significant medical stressors, major surgery, and prolonged hospitalization. 2.  Discussed the role of the rehabilitation unit and clarified that her presence on the unit indicates a positive prognosis for recovery and an expectation of improvement. 3.  Reinforced the concept that the current estimated discharge date is based on the expected time needed for functional improvement and is a positive indicator. 4.  Educated on the link between physical fatigue and cognitive difficulties, such as attention and focus. Advised focusing on physical recovery with PT/OT as the primary means to improve cognitive stamina. 5.  Encouraged active participation in discharge planning, including expressing preferences for outpatient therapy locations. 6.  Validated the emotional difficulty of the situation while reframing it within a context of active recovery and expected improvement. 7.  Advised to continue asking questions of the care team. 8.  Will continue to monitor progress as part of the multidisciplinary team.           Electronically Signed   _______________________ Norleen Asa, Psy.D. Clinical Neuropsychologist

## 2024-08-20 NOTE — Progress Notes (Signed)
 Occupational Therapy Session Note  Patient Details  Name: Lauren Gray MRN: [id number] Date of Birth: [birth year]  Today's Date: 08/20/2024 OT Individual Time: 9260-9147 OT Individual Time Calculation (min): 73 min    Short Term Goals: Week 1:  OT Short Term Goal 1 (Week 1): The pt safely transfer to all surface with MinA using AE as needed. OT Short Term Goal 2 (Week 1): The pt will bathe/dress LB safely with MinA using AE as needed.. OT Short Term Goal 3 (Week 1): The pt will safely participate in > 30 minutes of Occupational Therapy  activity with minimal rest breaks following demonstration and initial cues. OT Short Term Goal 4 (Week 1): The pt will safely demonstrate a simple homemaking task at [facility] after demonstration and initial cues.  Skilled Therapeutic Interventions/Progress Updates:   Pt greeted supine in bed, pt agreeable to OT intervention.      Transfers/bed mobility/functional mobility:  Pt completed bed mobility with CGA. Pt completed sit>stands with RW and CGA, increased time an effort noted. Stand pivot transfer to w/c with RW and CGA.   Therapeutic activity:  Pt completed functional endurance task with an emphasis on improving tolerance for sit>stands. Pt able to stand with CGA x4 reps before fatiguing. Pt continues to report that she needs to work on her hand strength, continued to recommend a combination of working on hand strength but also endurance to tolerate ADL/ functional mobility.   ADLs:  UB dressing:donned shirt from EOB with set- up assist.  LB dressing: donned pants with MODA , pt needed assist to thread RLE and pull pants to waist line on L side.  Footwear: donned shoes/socks with total A.      Assessments:  The Dynamometer Grip Strength Test is a quantitative and objective measure of isometric muscular strength of the hand and forearm.  -Instructions The patient was asked to sit with their back, pelvis, and knees at 90 degrees. The shoulder was  adducted and neutrally rotated with The elbow flexed to 90 degrees and forearm in neutral. The arm was not supported.  -Results The score was determined by calculating the average of 3 trials. The pt's average score was 11 lbs in the R hand and 5 lbs in the L hand.    Female Average in lbs 55-59 R 57.3 L 47.3 60-64 R 55.1 L 45.7 65-69 R 49.6 L 41.0 70-74 R 49.6 L 41.5 75+ R 42.6 L 37.6  Exercises: pt requested to work on on hand strength, issued pt level 1 theraputty, pt completed below HEP:   HOME EXERCISE PROGRAM  Theraputty Exercises  **Do the following exercises using your affected hand.  1. Roll putty into a ball.   2. Make into a pancake.   3. Roll into a log.   4. Pinch along log with first finger and thumb.   5. Make into a ball.   6. Roll it back into a log.   7. Pinch using thumb and side of first finger.   8. Roll it into a ball, then flatten into a pancake.   9. Using your fingers, make putty into a mountain.   10. Hide several coins or marbles in putty, then pick them out one by one.                  Ended session with pt seated in w/c with all needs within reach.  Therapy Documentation Precautions:  Precautions Precautions: Fall, Other (comment) Precaution/Restrictions Comments: JP drainx2 (L),  PEG tube Restrictions Weight Bearing Restrictions Per Provider Order: No  Pain: Unrated generalized pain reported during session, rest breaks provided as needed.    Therapy/Group: Individual Therapy  Ronal Gift [facility] 08/20/2024, 12:03 PM

## 2024-08-20 NOTE — Progress Notes (Signed)
 Subjective/Chief Complaint: Feeling improved today. Tolerated dinner last night. No n/v this AM. Still passing gas.     Objective: Vital signs in last 24 hours: Temp:  [98.5 F (36.9 C)-98.9 F (37.2 C)] 98.5 F (36.9 C) (10/08 0450) Pulse Rate:  [81-90] 90 (10/08 0450) Resp:  [17-18] 18 (10/08 0450) BP: (124-133)/(48-58) 128/53 (10/08 0450) SpO2:  [98 %-100 %] 100 % (10/08 0450) Last BM Date : 08/18/24  Intake/Output from previous day: 10/07 0701 - 10/08 0700 In: 400 [P.O.:90; I.V.:10; NG/GT:300] Out: 510 [Drains:510] Intake/Output this shift: No intake/output data recorded.  General:  alert and oriented. Smiling, working with OT.  Pulm:  breathing comfortably Abd:  non distended, non tender Right sided drains purulent c/w panc fluid.   Ext:  warm, well perfused.   Lab Results:  Recent Labs    08/18/24 0430 08/20/24 0426  WBC 6.1 6.7  HGB 8.4* 8.9*  HCT 26.9* 29.0*  PLT 301 329   BMET Recent Labs    08/18/24 0430  NA 137  K 4.0  CL 105  CO2 20*  GLUCOSE 286*  BUN 22  CREATININE 1.02*  CALCIUM  7.9*   PT/INR No results for input(s): LABPROT, INR in the last 72 hours. ABG No results for input(s): PHART, HCO3 in the last 72 hours.  Invalid input(s): PCO2, PO2  Studies/Results: DG Abd 1 View Result Date: 08/19/2024 CLINICAL DATA:  Epigastric abdominal discomfort.  Postop Whipple. EXAM: ABDOMEN - 1 VIEW COMPARISON:  Radiograph 08/15/2024 FINDINGS: Multiple drains project over the right and left abdomen. Scattered air within prominent small bowel, measuring up to 3.2 cm in the left abdomen. Minimal formed stool in the colon. Surgical clips in the upper abdomen. IMPRESSION: Scattered air within prominent small bowel, measuring up to 3.2 cm in the left abdomen, favor postoperative ileus. Multiple drains remain in place. Electronically Signed   By: Andrea Gasman M.D.   On: 08/19/2024 14:58    Anti-infectives: Anti-infectives (From  admission, onward)    Start     Dose/Rate Route Frequency Ordered Stop   08/18/24 0800  fluconazole  (DIFLUCAN ) tablet 200 mg        200 mg Oral Daily 08/17/24 0819     08/17/24 1000  fluconazole  (DIFLUCAN ) tablet 400 mg        400 mg Oral  Once 08/17/24 0819 08/17/24 1002       Assessment/Plan: s/p Whipple,  jejunostomy tube Dr. 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  18 h tube feed cycle for full calorie intake.  went down a bit on amount to increase hunger.   Calorie counts.  Added megace yesterday for appetite stimulant.  Hope to decrease tube feeds a bit more tomorrow if calorie counts indicate a reasonable amount of calories from PO intake.  - Completed abx 9/27. No current indication for abx. Monitor. Afebrile with nl WBC. Still normal today.  - On fiber. Prn loperamide .  Continue and monitor BM's - Continue pain regimen. Only needing prn tramadol /tylenol .  - Continue PT/OT. Tolerating.  - Cont drains. Drain output increased.  Continue to follow.  Panc leak.     FEN: Soft diet as tolerated.  VTE: SCDs, SQH ID: Vanc/zosyn /diflucan  discontinued 9/27. WBCs wnl on last check. Afebrile.      Dispo - per PM&R. Significantly improved this AM compared to yesterday PM.     LOS: 5 days    Jina LITTIE Aron, MD,  FACS, FSSO Surgical Oncology, General Surgery, Trauma and Critical Care [facility], [place] [phone] for weekday/non holidays Check [email or web address] for coverage night/weekend/holidays

## 2024-08-20 NOTE — Progress Notes (Addendum)
 Initial Nutrition Assessment  DOCUMENTATION CODES:   Non-severe (moderate) malnutrition in context of chronic illness, Obesity unspecified  INTERVENTION:  Continue nocturnal tube feeding via J tube: recommend decreasing run time for feeds to give pt more time disconnected and promote PO intake during day Osmolite 1.5 at 55 ml/h x 12h/day (from 6pm-6am) (660 ml per day) Prosource TF20 60 ml BID Provides 1150 kcal (meets 57% kcal needs), 81 gm protein (meets 74% protein needs), 502 ml free water  daily  Recommend increasing Creon  dosing or looking into Relizorb to promote proper absorption of continuous feeds  Recommend discussing advancement of diet from soft to regular to provide increased options for pt and promote increased PO intake during day where pt receives Creon  and proper insulin  coverage  Reach out to diabetes coordinators to help manage tube feeding coverage   Continue calorie count and monitoring pt's PO intake  NUTRITION DIAGNOSIS:   Moderate Malnutrition related to acute illness (s/p Whipple) as evidenced by mild fat depletion, mild muscle depletion.  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 Pt's hospital course included TPN and J tube placement 9/9. Pt's diet advanced to soft diet at time of discharge but still receiving 100% of needs via enteral nutrition. Pt admitted to CIR for functional decline.   MD ordered calorie count, RD monitoring intake.   Day 1: 10/6 B: 25% french toast, yogurt, juice; 0% bacon (179 kcal, 7 g protein) L: 25% grilled cheese, banana, chicken noodle soup  (91 kcal, 4 g protein) D: 40% yogurt, pears, lemonade (136 kcal, 18 g protein) Total Daily Intake: 406 kcal (meets 20% calorie needs), 7 g protein (meets 6% protein needs)  Calorie and protein intake poor. Pt's PO intake is not meeting her needs and pt reports continued poor intake due to not being able to taste any of the food she is eating. She states it makes it difficult to force herself to eat. Pt also requested further advancement for her diet so she can have increased options, specifically requested fruits like grapes but currently cannot order on a soft diet. Pt endorsed she does not like any ONS offered by hospital. Pt continue cyclic tube feeds which have been managed by surgery. Feeds currently running via J tube for 16 hr/day and are not meeting pt's needs. Pt also likely not absorbing nutrients from tube feeds given that she no longer has the ability to secrete pancreatic enzymes to break down and absorb nutrients from continuous feeds. Pt is only receiving Creon  with PO meals TID, but pt needs a product like Relizorb for continuous tube feeds in order to have proper absorption. Pt's glucose has also not been well managed with cyclic feeds, consistently having high blood sugar at night and early morning, recommend consulting diabetes care coordinators to help offer insulin  coverage for cyclic tube feeds.  Tried to reach out to surgery team to discuss feeding plan, but unable to reach team. Pt expressed frustration of continued tube feeds and lack of progress. Suggest decreasing feeding run time to 12 hr/day to allow pt more time disconnected from feeds. Would also adjust rate of tube feeds  to meet between 50-60% of pt's needs to promote increased intake during day. Would like to connect with surgery team to discuss feeding plan for discharge and see if pt can be advanced to regular diet since she is 1 month post op from Whipple. Will outline recommendations for tube feeds in interventions above.    Pt now has mild muscle and mild fat depletions since previous physical exam done 8/26. Suspect depletions related to malnutrition in the context of acute illness since pt is likely not absorbing majority of calories and protein from tube feeds and pt has heavily relied on tube feeds to meet her calorie and protein needs. Depletions will likely continue to worsen if pt's PO intake does not improve since Creon  is offered at meals. Pt endorses continued fatigue and feels like she has not made progress with therapies.   Medications: Banatrol BID Free water  100 mL TID SSI 0-15 units TID SSI 6 units TID Lantus  12 units daily Creon  TID w/ meals Megace Protonix   Labs: CBG x 24 hr: 123-343 mg/dL J8r 6.1  NUTRITION - FOCUSED PHYSICAL EXAM:  Flowsheet Row Most Recent Value  Orbital Region Mild depletion  Upper Arm Region No depletion  Thoracic and Lumbar Region No depletion  Buccal Region No depletion  Temple Region Mild depletion  Clavicle Bone Region Mild depletion  Clavicle and Acromion Bone Region Mild depletion  Scapular Bone Region Mild depletion  Dorsal Hand Mild depletion  Patellar Region No depletion  Anterior Thigh Region No depletion  Posterior Calf Region No depletion  Edema (RD Assessment) None  Hair Reviewed  Eyes Reviewed  Mouth Reviewed  Skin Reviewed  Nails Reviewed    Diet Order:   Diet Order             DIET SOFT Room service appropriate? Yes; Fluid consistency: Thin  Diet effective now                   EDUCATION NEEDS:   Education needs have been addressed  Skin:  Skin Assessment: Reviewed RN Assessment  Last BM:  10/7 bm x 3 (type 6 and 7)  Height:   Ht Readings from Last 1 Encounters:  08/15/24 5' 8 (1.727 m)    Weight:   Wt Readings from Last 1 Encounters:  08/19/24 113.9 kg    Ideal Body Weight:  63.6 kg  BMI:  Body mass index is 38.16 kg/m.  Estimated Nutritional Needs:   Kcal:  2000-2200  Protein:   110-125g  Fluid:  >/= 2L    Josette Glance, MS, RDN, LDN Clinical Dietitian I Please reach out via secure chat or call [phone]

## 2024-08-20 NOTE — Progress Notes (Signed)
 Patient ID: Lauren Gray, female   DOB: 01-Mar-1951, 73 y.o.   MRN: [id number] Met with pt and husband who is present to give them team conference update with goals of supervision level and target discharge date of 10/17. She is aware she will be going home with the JP drains and possibly the J-tube due to not eating enough to meet her nutrition needs. Daughter stays the night with her and husband is here all day to provide support. Pt is tearful and feels she is doing well but is very tired after therapies and needs to rest in between. Aware needs to be able to go up five stairs at home to get into the home. PT will begin working on this with her. Discussed home health and will await equipment needs, closer to discharge. Will await dietician input due to if pt able to eat the tube feedings will not be covered at discharge. Pt is trying to eat more. Will work on discharge needs.

## 2024-08-21 DIAGNOSIS — E440 Moderate protein-calorie malnutrition: Secondary | ICD-10-CM | POA: Insufficient documentation

## 2024-08-21 LAB — CBC
HCT: 27.3 % — ABNORMAL LOW (ref 36.0–46.0)
Hemoglobin: 8.4 g/dL — ABNORMAL LOW (ref 12.0–15.0)
MCH: 27.6 pg (ref 26.0–34.0)
MCHC: 30.8 g/dL (ref 30.0–36.0)
MCV: 89.8 fL (ref 80.0–100.0)
Platelets: 310 K/uL (ref 150–400)
RBC: 3.04 MIL/uL — ABNORMAL LOW (ref 3.87–5.11)
RDW: 15.5 % (ref 11.5–15.5)
WBC: 6.8 K/uL (ref 4.0–10.5)
nRBC: 0 % (ref 0.0–0.2)

## 2024-08-21 LAB — BASIC METABOLIC PANEL WITH GFR
Anion gap: 10 (ref 5–15)
BUN: 24 mg/dL — ABNORMAL HIGH (ref 8–23)
CO2: 20 mmol/L — ABNORMAL LOW (ref 22–32)
Calcium: 8.1 mg/dL — ABNORMAL LOW (ref 8.9–10.3)
Chloride: 105 mmol/L (ref 98–111)
Creatinine, Ser: 1.18 mg/dL — ABNORMAL HIGH (ref 0.44–1.00)
GFR, Estimated: 49 mL/min — ABNORMAL LOW (ref >60)
Glucose, Bld: 290 mg/dL — ABNORMAL HIGH (ref 70–99)
Potassium: 4.1 mmol/L (ref 3.5–5.1)
Sodium: 135 mmol/L (ref 135–145)

## 2024-08-21 LAB — GLUCOSE, CAPILLARY
Glucose-Capillary: 323 mg/dL — ABNORMAL HIGH (ref 70–99)
Glucose-Capillary: 201 mg/dL — ABNORMAL HIGH (ref 70–99)
Glucose-Capillary: 180 mg/dL — ABNORMAL HIGH (ref 70–99)
Glucose-Capillary: 154 mg/dL — ABNORMAL HIGH (ref 70–99)

## 2024-08-21 MED ORDER — INSULIN GLARGINE 100 UNIT/ML ~~LOC~~ SOLN
14.0000 [IU] | Freq: Two times a day (BID) | SUBCUTANEOUS | Status: DC
  Administered 2024-08-21 – 2024-08-22 (×3): 14 [IU] via SUBCUTANEOUS
  Filled 2024-08-21 (×4): qty 0.14

## 2024-08-21 MED ORDER — OSMOLITE 1.5 CAL PO LIQD
1260.0000 mL | ORAL | Status: DC
  Administered 2024-08-21 – 2024-08-28 (×8): 1260 mL
  Filled 2024-08-21 (×3): qty 1422

## 2024-08-21 NOTE — Progress Notes (Signed)
 Occupational Therapy Session Note  Patient Details  Name: Lauren Gray MRN: [id number] Date of Birth: 1951/10/16  Today's Date: 08/21/2024 OT Individual Time: 9064-8948 OT Individual Time Calculation (min): 76 min    Short Term Goals: Week 1:  OT Short Term Goal 1 (Week 1): The pt safely transfer to all surface with MinA using AE as needed. OT Short Term Goal 2 (Week 1): The pt will bathe/dress LB safely with MinA using AE as needed.. OT Short Term Goal 3 (Week 1): The pt will safely participate in > 30 minutes of Occupational Therapy  activity with minimal rest breaks following demonstration and initial cues. OT Short Term Goal 4 (Week 1): The pt will safely demonstrate a simple homemaking task at [facility] after demonstration and initial cues.  Skilled Therapeutic Interventions/Progress Updates:  Pt greeted supine in bed, pt agreeable to OT intervention.      Transfers/bed mobility/functional mobility:  Pt completed supine>sit with CGA. Pt completed sit>stands with CGA- MINA, more MIN A as pt fatigued. Pt completed mutliple stand pivot transfer with RW with CGA- MINA, increased assist needed as pt fatigued.    ADLs:  Grooming: pt completed seated oral care at sink with set- up assist.  UB dressing:pt donned [place] shirt with set- up assist LB dressing: pt donned pants with MIN A needing assist to thread d/t gripper socks but did assist with pulling pants to waist in standing.   Footwear: donned socks with total A for time mgmt.   Bathing: pt completed bathing at sink with overall MODA. Pt needed assist to wash lower legs and back.  Transfers: pt completed emergent transfer to [facility] d/t urgency to void. MIN A with RW and mod cues for safety and sequencing.  Toileting: pt able to cleanser periarea from standing with CGA. Incontinent urine void.    Exercises:  Pt completed seated grip strengthening task with pt instructed to pin weighted clothespins on horizontal line with a focus on improving  intrinsic strength to facilitate independence with ADLs.   Ended session with pt seated in w/c with all needs within reach and sister present.  Therapy Documentation Precautions:  Precautions Precautions: Fall, Other (comment) Precaution/Restrictions Comments: JP drainx2 (L), PEG tube Restrictions Weight Bearing Restrictions Per Provider Order: No  Pain: Unrated general pain reported, rest breaks provided as needed.   Therapy/Group: Individual Therapy  Ronal Mallie Needy 08/21/2024, 12:15 PM

## 2024-08-21 NOTE — Progress Notes (Signed)
 Occupational Therapy Session Note  Patient Details  Name: Lauren Gray MRN: [id number] Date of Birth: [birth year]  Today's Date: 08/21/2024 OT Individual Time: 1100-1148 OT Individual Time Calculation (min): 48 min (missed 12 min due to fatigue)   Short Term Goals: Week 1:  OT Short Term Goal 1 (Week 1): The pt safely transfer to all surface with MinA using AE as needed. OT Short Term Goal 2 (Week 1): The pt will bathe/dress LB safely with MinA using AE as needed.. OT Short Term Goal 3 (Week 1): The pt will safely participate in > 30 minutes of Occupational Therapy  activity with minimal rest breaks following demonstration and initial cues. OT Short Term Goal 4 (Week 1): The pt will safely demonstrate a simple homemaking task at [facility] after demonstration and initial cues.  Skilled Therapeutic Interventions/Progress Updates:    Pt received in wc with her sister in the room.   To adjust back support, had pt do a partial stand by pushing up on arm rests.     Discussed her home set up of a bathtub with grab bars.  Pt is not able to shower now, but discussed potential DME needs for once she is able to shower. Pt will most likely need a tub bench.  Offered to take pt outside as she likes being out, but pt stated it was too cool and she did not have a jacket.   Helped pt don her socks and then pt donned slip in shoes.  Pt and sister taken to gym to have pt use arm bike for conditioning and upper body strengthening.  It took several minutes to get handles situated at just the right angle to not have her feel pulling on her abdomen and to avoid hitting her legs. Pt then able to do several minutes at low intensity of 1.  Pt stated she felt tired and had enough of that exercise.   To continue to work on arm strength, pt given a 3 Lb dowel bar for elbow flexion with arm extension with hands supinated then pronated. Demonstrated exercises but pt only able to move arms in 70% of the demonstrated range of  motion.  She would only do 7-8 reps at a time before stopping, stating she needed to save her energy for PT after lunch. Pt tends to be self limiting.  Needs max cues to challenge herself.   For LE AROM,  used towels under feet for skating exercises with cues to fully flex and extend knees. Pt stated she liked the exercise but after 3 min stopped as she was tired and again was focused on saving up her energy for PT. Pt declined any other intervention.   Pt returned to room with sister in room with her.    Therapy Documentation Precautions:  Precautions Precautions: Fall, Other (comment) Precaution/Restrictions Comments: JP drainx2 (L), PEG tube Restrictions Weight Bearing Restrictions Per Provider Order: No   Pain:  pt has some abdominal pain with stretching and reaching, but no pain at rest   ADL: ADL Equipment Provided: Reacher Eating: Other (comment) (feeding tube) Where Assessed-Eating: Bed level Grooming: Setup Where Assessed-Grooming: Sitting at sink Upper Body Bathing: Setup Where Assessed-Upper Body Bathing: Sitting at sink Lower Body Bathing: Moderate assistance, Minimal assistance Where Assessed-Lower Body Bathing: Edge of bed Upper Body Dressing: Setup Where Assessed-Upper Body Dressing: Edge of bed Lower Body Dressing: Moderate assistance Where Assessed-Lower Body Dressing: Edge of bed Toileting: Minimal assistance, Moderate assistance (based on performance with bathing  at sink LOF) Where Assessed-Toileting: Other (Comment) Toilet Transfer: Minimal assistance, Moderate assistance Toilet Transfer Method: Proofreader: Extra wide bedside commode, Grab bars (based on coming from sit to stand at bed and sink LOF) Tub/Shower Transfer: Minimal assistance, Moderate assistance Tub/Shower Transfer Method: Ship broker: Shower seat with back, Walk in Music therapist: Minimal assistance, Moderate assistance Lexicographer Method:  (based on patient's ability to come from sit to stand and ambulate along side the bed) [employer]: Shower seat with back     Therapy/Group: Individual Therapy  Jamaris Biernat 08/21/2024, 9:13 AM

## 2024-08-21 NOTE — Progress Notes (Signed)
 Calorie Count Note  Ongoing calorie count ordered. Pt's intake has improved since day 1 of calorie count. Pt now meeting ~48% of calorie needs and 63% protein needs with PO intake. Pt's energy level seems improved today. Recommend continuing calorie count to monitor progress and assess if any changes in tube feeding regimen can be made as pt approaches discharge. Recommend discussion of feeding plan with surgical team.  Diet: Soft Supplements: ProSource TF 20  Estimated Nutritional Needs:  Kcal:  2000-2200 Protein:  110-125g    10/8: Breakfast: 20% pancakes, bacon, juice (124 kcal, 2 g protein) Lunch: 50% yogurt, pears, lemonade (170 kcal, 9 g protein) Dinner: 100% yogurt, pears, lemonade; 50% chicken, creamed potatoes, mixed vegetables, carrots (578 kcal, 38 g protein) Supplements: ProSource (80 kcal, 20 g protein)  Total intake: 952 kcal (48% of minimum estimated needs)  69 protein (63% of minimum estimated needs)    NUTRITION DIAGNOSIS:  Moderate Malnutrition related to acute illness (s/p Whipple) as evidenced by mild fat depletion, mild muscle depletion.   GOAL:  Patient will meet greater than or equal to 90% of their needs  INTERVENTION: Continue Calorie Count  Continue Creon  TID w/ meals to allow for proper absorption of nutrients from meals  Monitor Tube Feeding regimen  Discuss feeding plan with surgical team and care team    Josette Glance, MS, RDN, LDN Clinical Dietitian I Please reach out via secure chat

## 2024-08-21 NOTE — Progress Notes (Signed)
   Subjective/Chief Complaint: Continues to tolerate diet.  Had a small amount nausea this am but this was averted with compazine .  No emesis.     Objective: Vital signs in last 24 hours: Temp:  [97.4 F (36.3 C)-98.5 F (36.9 C)] 97.4 F (36.3 C) (10/09 0359) Pulse Rate:  [78-85] 85 (10/09 0359) Resp:  [18] 18 (10/09 0359) BP: (130-142)/(46-58) 142/58 (10/09 0359) SpO2:  [100 %] 100 % (10/09 0359) Weight:  [90 kg] 90 kg (10/09 0700) Last BM Date : 08/20/24  Intake/Output from previous day: 10/08 0701 - 10/09 0700 In: 360 [P.O.:50; I.V.:10; NG/GT:300] Out: 305 [Drains:305] Intake/Output this shift: No intake/output data recorded.  General:  alert and oriented. .  Pulm:  breathing comfortably Abd:  non distended, sore LUQ at J tube site.  Right sided drains purulent c/w panc fluid.   Ext:  warm, well perfused.   Lab Results:  Recent Labs    08/20/24 0426 08/21/24 0506  WBC 6.7 6.8  HGB 8.9* 8.4*  HCT 29.0* 27.3*  PLT 329 310   BMET Recent Labs    08/21/24 0506  NA 135  K 4.1  CL 105  CO2 20*  GLUCOSE 290*  BUN 24*  CREATININE 1.18*  CALCIUM  8.1*   PT/INR No results for input(s): LABPROT, INR in the last 72 hours. ABG No results for input(s): PHART, HCO3 in the last 72 hours.  Invalid input(s): PCO2, PO2  Studies/Results: No results found.   Anti-infectives: Anti-infectives (From admission, onward)    Start     Dose/Rate Route Frequency Ordered Stop   08/18/24 0800  fluconazole  (DIFLUCAN ) tablet 200 mg        200 mg Oral Daily 08/17/24 0819     08/17/24 1000  fluconazole  (DIFLUCAN ) tablet 400 mg        400 mg Oral  Once 08/17/24 0819 08/17/24 1002       Assessment/Plan: s/p Whipple,  jejunostomy tube Dr. 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  Margins negative.  18 h tube feed cycle for full calorie intake.   Calorie  counts.  Added megace for appetite stimulant.  Decrease tube feeds. - Completed abx 9/27. No current indication for abx. Monitor. Afebrile with nl WBC.  - On fiber. Prn loperamide .  Continue and monitor BM's - Continue pain regimen. Only needing prn tramadol /tylenol .  - Continue PT/OT. Tolerating.  - Cont drains. Drain output increased.  Continue to follow.  Panc leak.     FEN: Soft diet as tolerated.  VTE: SCDs, SQH ID: Vanc/zosyn /diflucan  discontinued 9/27. WBCs wnl on last check. Afebrile.      Dispo - per PM&R. Significantly improved this AM compared to yesterday PM.     LOS: 6 days    Jina LITTIE Aron, MD, FACS, FSSO Surgical Oncology, General Surgery, Trauma and Critical [facility], [place] [phone] for weekday/non holidays [email or web address] for coverage night/weekend/holidays

## 2024-08-21 NOTE — Patient Care Conference (Signed)
 Inpatient RehabilitationTeam Conference and Plan of Care Update Date: 08/20/2024   Time: 10:09 AM    Patient Name: Lauren Gray      Medical Record Number: [id number]  Date of Birth: Apr 09, 1951 Sex: Female         Room/Bed: 4W20C/4W20C-01 Payor Info: Payor: [employer] / Plan: [facility] / Product Type: *No Product type* /    Admit Date/Time:  08/15/2024  8:31 PM  Primary Diagnosis:  Debility  Hospital Problems: Principal Problem:   Debility Active Problems:   Adjustment disorder with depressed mood   Malnutrition of moderate degree    Expected Discharge Date: Expected Discharge Date: 08/29/24  Team Members Present: Physician leading conference: Dr. Prentice Compton Social Worker Present: Rhoda Clement, LCSW Nurse Present: Barnie Ronde, RN PT Present: Delinda Bertrand, PTA OT Present: Recardo Maxwell, OT SLP Present: Recardo Mole, SLP     Current Status/Progress Goal Weekly Team Focus  Bowel/Bladder   Pt continent vs incontinent of bowel/bladder.   Remain continent during stay.   Toilet q 4 while awake and PRN.    Swallow/Nutrition/ Hydration               ADL's   supervision UB self care,  mod A to don pants and toileting,  close S for stand pivot transfers   Mod ind goals (may change to supervision if pt requests to leave early)   ADL training, functional mobility,  Pt education, UE strengthening    Mobility   bed mobility - CGA/supervision; Ambulation CGA (up to 18') in RW, stairs (not attempted); transfers = CGA/minA to stand   Supervision  barriers - self limiting, pain. focus - endurance, ambulation, therex, pt and family ed, initiate stair/step training    Communication                Safety/Cognition/ Behavioral Observations               Pain   Pt denies any pain at this time.   Pain score to remain less than or equal to 3 on pain scale.   Assess for pain q shift and PRN.    Skin   Abdominal incision midline lower abdomen, 2 JP drains  R side of abdomen, PEG site   Skin to remain free from s/sx of infection and breakdown.  Assess skin q shift and PRN.      Discharge Planning:  Home with husband who is here daily and observing. Husband is disabled himself but can assist some. Pt is wanting to leave soon but needs to be at a level she can be managed at home. Work on discharge needs.   Team Discussion: Patient admitted with debility post gastric outlet obstruction from duodenal cancer with jejunostomy post whipple procedure. Complicated by pleural effusions, anemia and pna; continue with JP drain x 2. Functional improvement limited by eccentric and self limiting behaviors with dizziness and pain.  Patient on target to meet rehab goals: Currently mod assist for lower body self care.  Able to complete sit - stand wit supervision and navigate 6 steps with min assist using a RW.  Able to ambulate up to 27' using a RW with CGA.   *See Care Plan and progress notes for long and short-term goals.   Revisions to Treatment Plan:  Neuro psych consult   Teaching Needs: Safety, medications, skin care/drain care, nutritional means/J-tube care, transfers, toileting, etc.   Current Barriers to Discharge: Decreased caregiver support, Home enviroment access/layout, Nutritional means,  and surgical drains  Possible Resolutions to Barriers: Family education     Medical Summary Current Status: pain improving, abd distention , poor appetite, poor endurance  Barriers to Discharge: Medical stability;Pending surgery/plan   Possible Resolutions to [employer]: will go with drains, probable J tube feeds upon discharge   Continued Need for Acute Rehabilitation Level of Care: The patient requires daily medical management by a physician with specialized training in physical medicine and rehabilitation for the following reasons: Direction of a multidisciplinary physical rehabilitation program to maximize functional independence :  Yes Medical management of patient stability for increased activity during participation in an intensive rehabilitation regime.: Yes Analysis of laboratory values and/or radiology reports with any subsequent need for medication adjustment and/or medical intervention. : Yes   I attest that I was present, lead the team conference, and concur with the assessment and plan of the team.   Fredericka Sober B 08/21/2024, 10:45 AM

## 2024-08-21 NOTE — Progress Notes (Signed)
 PROGRESS NOTE   Subjective/Complaints:  Appreciate Neuropsych note  ROS:  Pt denies SOB,  (+) mild-stable-abd pain, CP, N/V/ (+)C/D, and vision changes    Objective:   DG Abd 1 View Result Date: 08/19/2024 CLINICAL DATA:  Epigastric abdominal discomfort.  Postop Whipple. EXAM: ABDOMEN - 1 VIEW COMPARISON:  Radiograph 08/15/2024 FINDINGS: Multiple drains project over the right and left abdomen. Scattered air within prominent small bowel, measuring up to 3.2 cm in the left abdomen. Minimal formed stool in the colon. Surgical clips in the upper abdomen. IMPRESSION: Scattered air within prominent small bowel, measuring up to 3.2 cm in the left abdomen, favor postoperative ileus. Multiple drains remain in place. Electronically Signed   By: Andrea Gasman M.D.   On: 08/19/2024 14:58    Recent Labs    08/20/24 0426 08/21/24 0506  WBC 6.7 6.8  HGB 8.9* 8.4*  HCT 29.0* 27.3*  PLT 329 310   Recent Labs    08/21/24 0506  NA 135  K 4.1  CL 105  CO2 20*  GLUCOSE 290*  BUN 24*  CREATININE 1.18*  CALCIUM  8.1*    Intake/Output Summary (Last 24 hours) at 08/21/2024 0855 Last data filed at 08/21/2024 0700 Gross per 24 hour  Intake 360 ml  Output 305 ml  Net 55 ml        Physical Exam: Vital Signs Blood pressure (!) 142/58, pulse 85, temperature (!) 97.4 F (36.3 C), temperature source Oral, resp. rate 18, height 5' 8 (1.727 m), weight 90 kg, SpO2 100%.  General: No acute distress Mood and affect are appropriate Heart: Regular rate and rhythm no rubs murmurs or extra sounds Lungs: Clear to auscultation, breathing unlabored, no rales or wheezes Abdomen: Positive bowel sounds, soft nontender to palpation, mild LUQ distention no tenderness or drainage around tubes , midline granulating incision site unchanged  RLQ drains, LLQ J tube mildly tender Extremities: No clubbing, cyanosis, or edema    Skin:  Midline  incision covered in clean dressing Drain sites c/d/I- unchanged/improving  10/9         Neuro: AAOx4. No apparent cognitive deficits    Sensory exam: revealed normal sensation in all dermatomal regions in bilateral upper extremities and bilateral lower extremities Motor exam: strength 5-/5 throughout bilateral upper extremities and bilateral lower extremities Coordination: Fine motor coordination was normal.        Assessment/Plan: 1. Functional deficits which require 3+ hours per day of interdisciplinary therapy in a comprehensive inpatient rehab setting. Physiatrist is providing close team supervision and 24 hour management of active medical problems listed below. Physiatrist and rehab team continue to assess barriers to discharge/monitor patient progress toward functional and medical goals  Care Tool:  Bathing    Body parts bathed by patient: Right arm, Left arm, Chest, Abdomen, Front perineal area, Buttocks, Right upper leg, Left upper leg, Face     Body parts n/a: Right lower leg, Left lower leg   Bathing assist Assist Level: Minimal Assistance - Patient > 75%     Upper Body Dressing/Undressing Upper body dressing   What is the patient wearing?: Pull over shirt    Upper body assist Assist Level: Set  up assist    Lower Body Dressing/Undressing Lower body dressing      What is the patient wearing?: Pants     Lower body assist Assist for lower body dressing: Moderate Assistance - Patient 50 - 74%     Toileting Toileting    Toileting assist Assist for toileting: Maximal Assistance - Patient 25 - 49%     Transfers Chair/bed transfer  Transfers assist  Chair/bed transfer activity did not occur: Safety/medical concerns (2/2 high fatigue)  Chair/bed transfer assist level: Contact Guard/Touching assist     Locomotion Ambulation   Ambulation assist   Ambulation activity did not occur: Safety/medical concerns (2/2 high fatigue)          Walk 10  feet activity   Assist  Walk 10 feet activity did not occur: Safety/medical concerns (2/2 high fatigue)        Walk 50 feet activity   Assist Walk 50 feet with 2 turns activity did not occur: Safety/medical concerns (2/2 high fatigue)         Walk 150 feet activity   Assist Walk 150 feet activity did not occur: Safety/medical concerns         Walk 10 feet on uneven surface  activity   Assist Walk 10 feet on uneven surfaces activity did not occur: Safety/medical concerns (2/2 high fatigue)         Wheelchair     Assist Is the patient using a wheelchair?: Yes Type of Wheelchair: Manual Wheelchair activity did not occur: Safety/medical concerns (2/2 high fatigue)         Wheelchair 50 feet with 2 turns activity    Assist    Wheelchair 50 feet with 2 turns activity did not occur: Safety/medical concerns (2/2 high fatigue)       Wheelchair 150 feet activity     Assist  Wheelchair 150 feet activity did not occur: Safety/medical concerns (2/2 high fatigue)       Blood pressure (!) 142/58, pulse 85, temperature (!) 97.4 F (36.3 C), temperature source Oral, resp. rate 18, height 5' 8 (1.727 m), weight 90 kg, SpO2 100%.   Medical Problem List and Plan: 1. 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  Antithrombotics: -DVT/anticoagulation:  Pharmaceutical: Lovenox              -antiplatelet therapy: N/A 3. Pain Management: Tylenol  prn. Oxycodone  d/c 9/28. Hydrocodone prn.    Pain improving  4. Mood/Behavior/Sleep: LCSW to follow for evaluation and support.              -antipsychotic agents: N/A 5. Neuropsych/cognition: This patient is capable of making decisions on her own behalf. 6. Skin/Wound Care: Monitor wound for healing. PICC site looking ok but  may d/c in am since no longer receiving TPN or IVF/IV meds 7. Fluids/Electrolytes/Nutrition: Monitor I/O. Continue tube feeds    Latest Ref Rng & Units 08/21/2024    5:06 AM 08/18/2024    4:30 AM 08/17/2024    5:22 AM  BMP  Glucose 70 - 99 mg/dL 709  713  712   BUN 8 - 23 mg/dL 24  22  23    Creatinine 0.44 - 1.00 mg/dL 8.81  8.97  8.92  Sodium 135 - 145 mmol/L 135  137  138   Potassium 3.5 - 5.1 mmol/L 4.1  4.0  4.2   Chloride 98 - 111 mmol/L 105  105  106   CO2 22 - 32 mmol/L 20  20  21    Calcium  8.9 - 10.3 mg/dL 8.1  7.9  8.2    Creat mildly increase , monitor  8. Duodenal adenocarcinoma w/gastric outlet obst:  continues on tube feed for nutritional support.  Improved motility passing gas but may have mild ileus per Gen surg, tolerated po vegetables and potatoes yesterday   9. Abdominal distension:  improve motility lass stomach gas, some mild ileus noted per Gen surg Not using hydrocodone and only 50mg  tramadol  per day     Large cont type 5 stool today  10. Fluid overload/Pleural effusions: In part due to third spacing. S/p thoracocentesis.  --Weight 241 at admission-->271-->253 today. Continue daily wts.  --Encourage pulmonary hygiene   11. HTN: Monitor BP TID--on amlodipine , catapres  and coreg              - Add hydralazine  25 mg Q8H PRN for HTN >170 SBP or >100 DBP, given recent highs   10/4-10/5 BP running low 140s- con't regimen Vitals:   08/20/24 2149 08/21/24 0359  BP: (!) 130/54 (!) 142/58  Pulse:  85  Resp:  18  Temp:  (!) 97.4 F (36.3 C)  SpO2:  100%   Systolic HTN, fair control 10/9 12.  Prediabetes: Hgb A1c- 6.1. Elevated BS due to tube feeds             --continue Lantus  with meal coverage and SSI             --monitor BS ac/hs and use SSI for elevated BS   CBG (last 3)  Recent Labs    08/20/24 1726 08/20/24 2051 08/21/24 0635  GLUCAP 224* 277* 323*  Increase lantus  10/8 Only ate 20% of 1 meal yesterday, no need to adjust TF   13. Acute on chronic  kidney disease: SCr 1.3 at admission and now WNL 14.  Obesity  Class 2: BMI 38.47 15.  ABLA: Recheck CBC in am. Hgb 8-9 range.   Stable 10/9    Latest Ref Rng & Units 08/21/2024    5:06 AM 08/20/2024    4:26 AM 08/18/2024    4:30 AM  CBC  WBC 4.0 - 10.5 K/uL 6.8  6.7  6.1   Hemoglobin 12.0 - 15.0 g/dL 8.4  8.9  8.4   Hematocrit 36.0 - 46.0 % 27.3  29.0  26.9   Platelets 150 - 400 K/uL 310  329  301     16. Severe protein malnutrition- J tube placed 9/9 Albumin  of 1.5- is declining-   10/5- On TF's changed to 2pm to 6am so can feel hungry- but might need higher dose/faster TF's to compensate during the time she's on; 17. Thrush  10/5- added Nystatin  swish and swallow QID as well as Diflucan  400 mg x1 and 200 mg daily x 6 days- per pt, things taste wrong so I feel this is causing that.  Ask dietary to follow oral vs enteral intake      LOS: 6 days A FACE TO FACE EVALUATION WAS PERFORMED  Prentice FORBES Compton 08/21/2024, 8:55 AM

## 2024-08-21 NOTE — Progress Notes (Signed)
 Physical Therapy Session Note  Patient Details  Name: Lauren Gray MRN: [id number] Date of Birth: [birth year]  Today's Date: 08/21/2024 PT Individual Time: 1406-1500 PT Individual Time Calculation (min): 54 min   Short Term Goals: Week 1:  PT Short Term Goal 1 (Week 1): Pt will complete bed mobility using CGA PT Short Term Goal 2 (Week 1): Pt will complete SPT to chair/WC using CGA + LRAD PT Short Term Goal 3 (Week 1): Pt will amb 53ft using CGA + LRAD PT Short Term Goal 4 (Week 1): Pt will initiate stair training  Skilled Therapeutic Interventions/Progress Updates: Patient semi-reclined in bed with family present on entrance to room. Patient alert and agreeable to PT session.   Patient reported unrated pain in lower abdomen around surgical site (premedicated).   Therapeutic Activity: Bed Mobility: Pt performed supine<sit on EOB with supervision and use of railing (HOB elevated) Transfers: Pt performed sit<>stand transfers throughout session with RW and with light minA/CGA when adhering to cue to push with B UE's on arm rest.  - Pt ambulated roughly 21' in main gym with RW and overall CGA for safety and WC follow (PTA towing). Pt encouraged one standing rest break instead of sitting. Pt required VC to maintain safe proximity to RW.  - Pt ambulated short distance in room at end of session in RW to bathroom with CGA and same VC to maintain safe proximity to RW  Therapeutic Exercise: Pt performed the following exercises with therapist providing the described cuing and facilitation for improvement. - Static standing tolerance to increase endurance to B LE's (roughly 45 seconds then pt required seated rest break). - Stepping to 6 step x 5 B LE's with B UE support with rest break required and CGA provided for safety. Pt requested return to room due to need to void bladder. Pt transported back in [facility] dependently  Patient in bathroom with NT at end of session with brakes locked.       Therapy Documentation Precautions:  Precautions Precautions: Fall, Other (comment) Precaution/Restrictions Comments: JP drainx2 (L), PEG tube Restrictions Weight Bearing Restrictions Per Provider Order: No  Therapy/Group: Individual Therapy  Riccardo Holeman PTA 08/21/2024, 4:39 PM

## 2024-08-22 LAB — GLUCOSE, CAPILLARY
Glucose-Capillary: 302 mg/dL — ABNORMAL HIGH (ref 70–99)
Glucose-Capillary: 138 mg/dL — ABNORMAL HIGH (ref 70–99)
Glucose-Capillary: 156 mg/dL — ABNORMAL HIGH (ref 70–99)
Glucose-Capillary: 276 mg/dL — ABNORMAL HIGH (ref 70–99)

## 2024-08-22 MED ORDER — INSULIN GLARGINE 100 UNIT/ML ~~LOC~~ SOLN
16.0000 [IU] | Freq: Two times a day (BID) | SUBCUTANEOUS | Status: DC
  Administered 2024-08-22 – 2024-08-24 (×4): 16 [IU] via SUBCUTANEOUS
  Filled 2024-08-22 (×5): qty 0.16

## 2024-08-22 MED ORDER — ENOXAPARIN SODIUM 60 MG/0.6ML IJ SOSY
50.0000 mg | PREFILLED_SYRINGE | INTRAMUSCULAR | Status: DC
  Administered 2024-08-22 – 2024-09-03 (×13): 50 mg via SUBCUTANEOUS
  Filled 2024-08-22 (×13): qty 0.6

## 2024-08-22 NOTE — Progress Notes (Signed)
 PICC removal order received. Discussed with Ulla, LPN. She requested removal after 3P due to PT schedule.

## 2024-08-22 NOTE — Group Note (Signed)
 Patient Details Name: Lauren Gray MRN: [id number] DOB: 24-Dec-1950 Today's Date: 08/22/2024  Time Calculation:   PT Group Time Calculation PT Group Start Time: 1030 PT Group Stop Time: 1130 PT Group Time Calculation (min): 60 min    Group Description: Stress management: Pt participated in group session with a focus on stress mgmt, education provided on healthy coping strategies, and social interaction. Focus of session on providing coping strategies to manage new diagnosis to allow for improved mental health to increase overall quality of life . Discussed how to break down stressors into "daily hassles," "major life stressors" and "life circumstances" in an effort to allow pts to chunk their stressors into groups and determine where to best put their efforts/time when dealing with stress. Provided active listening, emotional support and therapeutic use of self. Offered education on factors that protect us  against stress such as "daily uplifts," "healthy coping strategies" and "protective factors." Encouraged all group members to make an effort to actively recall one event from their day that was a daily uplift in an effort to protect their mindset from stressors as well as sharing this information with their caregivers to facilitate improved caregiver communication and decrease overall burden of care.  Issued pt handouts on healthy coping strategies to implement into routine.  Individual level documentation: Patient participated with fair collaboration during session.   Pain: Pain Assessment Pain Scale: 0-10 Pain Score: 0-No pain  Precautions:    [name] 08/22/2024, 12:31 PM

## 2024-08-22 NOTE — Progress Notes (Signed)
 Occupational Therapy Session Note  Patient Details  Name: ANTANIA HOEFLING MRN: [id number] Date of Birth: [birth year]  Today's Date: 08/22/2024 OT Individual Time: 9155-9093 OT Individual Time Calculation (min): 22 min    Short Term Goals: Week 1:  OT Short Term Goal 1 (Week 1): The pt safely transfer to all surface with MinA using AE as needed. OT Short Term Goal 1 - Progress (Week 1): Met OT Short Term Goal 2 (Week 1): The pt will bathe/dress LB safely with MinA using AE as needed.. OT Short Term Goal 2 - Progress (Week 1): Progressing toward goal OT Short Term Goal 3 (Week 1): The pt will safely participate in > 30 minutes of Occupational Therapy  activity with minimal rest breaks following demonstration and initial cues. OT Short Term Goal 3 - Progress (Week 1): Progressing toward goal OT Short Term Goal 4 (Week 1): The pt will safely demonstrate a simple homemaking task at [facility] after demonstration and initial cues. OT Short Term Goal 4 - Progress (Week 1): Progressing toward goal  Skilled Therapeutic Interventions/Progress Updates:  Pt greeted supine in bed calling out for help to get dressed, pt agreeable to OT intervention.      Transfers/bed mobility/functional mobility:  Pt completed supine>sit with CGA. Pt completed sit>stand from EOB with CGA. Ambulatory transfer to recliner with RW and CGA, however pt needed heavy MODA to stand from low recliner.    ADLs:  Grooming: pt completed seated oral care with set- up assist from her sister.  UB dressing:pt donned [place] shirt with set- up assist from recliner LB dressing: pt donned pants with light MIN A to thread and pull to waist line d/t decreased ability to reach forward, pt declined attempting to use reacher to don pants as pt reports she had tried to use AE in prior sessions.  Footwear: donned socks with total A but pt able to don slide on shoes with set- up assist.   Ended session with pt seated at sink finishing self care.    Precautions:  Precautions Precautions: Fall, Other (comment) Precaution/Restrictions Comments: JP drainx2 (L), PEG tube Restrictions Weight Bearing Restrictions Per Provider Order: No  Pain: Unrated pain reported in abdomen, rest breaks provided as needed.     Therapy/Group: Individual Therapy  Ronal Mallie Needy 08/22/2024, 12:09 PM

## 2024-08-22 NOTE — Progress Notes (Addendum)
   Subjective/Chief Complaint: Continues to tolerate diet -  had 1/2 container of pineapple, 75% of a boiled egg, over 50% of her breakfast potatoes, 1/3 piece sausage patty.  No emesis.   Her sister is at the bedside.   Objective: Vital signs in last 24 hours: Temp:  [98.1 F (36.7 C)-99.4 F (37.4 C)] 98.1 F (36.7 C) (10/10 0421) Pulse Rate:  [82-83] 83 (10/10 0421) Resp:  [17-18] 18 (10/10 0421) BP: (128-132)/(53-61) 132/53 (10/10 0421) SpO2:  [95 %-100 %] 100 % (10/10 0421) Weight:  [105 kg] 105 kg (10/10 0422) Last BM Date : 08/20/24  Intake/Output from previous day: 10/09 0701 - 10/10 0700 In: 750 [P.O.:450; NG/GT:300] Out: 243 [Drains:243] Intake/Output this shift: No intake/output data recorded.  General:  alert and oriented. .  Pulm:  breathing comfortably Abd:  non distended, sore LUQ at J tube site.  Right sided drains purulent c/w panc fluid.    Medial drain 230 mL  Lateral drain 15 mL  Ext:  warm, well perfused.   Lab Results:  Recent Labs    08/20/24 0426 08/21/24 0506  WBC 6.7 6.8  HGB 8.9* 8.4*  HCT 29.0* 27.3*  PLT 329 310   BMET Recent Labs    08/21/24 0506  NA 135  K 4.1  CL 105  CO2 20*  GLUCOSE 290*  BUN 24*  CREATININE 1.18*  CALCIUM  8.1*   PT/INR No results for input(s): LABPROT, INR in the last 72 hours. ABG No results for input(s): PHART, HCO3 in the last 72 hours.  Invalid input(s): PCO2, PO2  Studies/Results: No results found.   Anti-infectives: Anti-infectives (From admission, onward)    Start     Dose/Rate Route Frequency Ordered Stop   08/18/24 0800  fluconazole  (DIFLUCAN ) tablet 200 mg        200 mg Oral Daily 08/17/24 0819     08/17/24 1000  fluconazole  (DIFLUCAN ) tablet 400 mg        400 mg Oral  Once 08/17/24 0819 08/17/24 1002       Assessment/Plan: s/p Whipple,  jejunostomy tube Dr. 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  Margins negative.  18 h tube feed cycle for full calorie intake; currently decreased to 14 hours  Calorie counts.  Megace for appetite stimulant.  Decreased tube feeds 10/9 as above. - Completed abx 9/27. No current indication for abx. Monitor. Afebrile with nl WBC.  - On fiber. Prn loperamide .  Continue and monitor BM's - Continue pain regimen. Only needing prn tramadol /tylenol .  - Continue PT/OT. Tolerating.  - Cont drains. Drain output increased.  Continue to follow.  Panc leak.  - agree with primary team, ok to D/C PICC line from surgical standpoint   FEN: Soft diet as tolerated.  VTE: SCDs, SQH ID: Vanc/zosyn /diflucan  discontinued 9/27. WBCs wnl on last check. Afebrile.     Dispo - per PM&R.     LOS: 7 days    Almarie Pringle, [facility] Surgery Please see Amion for pager number during day hours 7:00am-4:30pm

## 2024-08-22 NOTE — Progress Notes (Signed)
 Physical Therapy Session Note  Patient Details  Name: Lauren Gray MRN: [id number] Date of Birth: Jan 11, 1951  Today's Date: 08/22/2024 PT Individual Time: 8650-8567 PT Individual Time Calculation (min): 43 min   Short Term Goals: Week 1:  PT Short Term Goal 1 (Week 1): Pt will complete bed mobility using CGA PT Short Term Goal 2 (Week 1): Pt will complete SPT to chair/WC using CGA + LRAD PT Short Term Goal 3 (Week 1): Pt will amb 64ft using CGA + LRAD PT Short Term Goal 4 (Week 1): Pt will initiate stair training  Skilled Therapeutic Interventions/Progress Updates: Patient sitting in [facility] with sister present on entrance to room. Patient alert and agreeable to PT session.   Patient reported that JP bulbs have been leaking and has been soiling clothes and bed linen. PTA alerted nsg and attending PA. Medical tape placed over top of cap for the time being until it can be further assessed by the appropriate channels. Pt reported increase in anxiety at night (pt stated she feels this increased since being around people in rehab floor after remaining solely in one room during acute stay prior to admission to inpatient rehab). Pt recalled breathing technique that OT had discussed with pt. PTA added environmental awareness strategy of find a few things pt can hear, touch, smell, taste and see with pt performing to understand sequence. Pt performed seated B marches and hip abduction with yellow theraband donned around distal thigh, and B LAQ with same band around ankles. Pt performed 3 sets of each and did so until close to fatigue with rest breaks required.  Patient sitting in WC at end of session with brakes locked, sister present, and all needs within reach.      Therapy Documentation Precautions:  Precautions Precautions: Fall, Other (comment) Precaution/Restrictions Comments: JP drainx2 (L), PEG tube Restrictions Weight Bearing Restrictions Per Provider Order: No  Therapy/Group: Individual  Therapy  Doll Frazee PTA 08/22/2024, 2:44 PM

## 2024-08-22 NOTE — Progress Notes (Signed)
 Occupational Therapy Weekly Progress Note  Patient Details  Name: Lauren Gray MRN: [id number] Date of Birth: July 09, 1951  Beginning of progress report period: August 16, 2024 End of progress report period: August 22, 2024  Today's Date: 08/22/2024 OT Individual Time: 0900-1015 and 1130-1200 OT Individual Time Calculation (min): 75 min and 30 min   Patient has met 1 of 4 short term goals.  Pt has not fully met the other STGs due to poor activity tolerance to engage in those tasks.   Patient continues to demonstrate the following deficits: muscle weakness, decreased cardiorespiratoy endurance, delayed processing, and decreased standing balance and decreased balance strategies and therefore will continue to benefit from skilled OT intervention to enhance overall performance with BADL.  Patient progressing toward long term goals..  Continue plan of care.  OT Short Term Goals Week 1:  OT Short Term Goal 1 (Week 1): The pt safely transfer to all surface with MinA using AE as needed. OT Short Term Goal 1 - Progress (Week 1): Met OT Short Term Goal 2 (Week 1): The pt will bathe/dress LB safely with MinA using AE as needed.. OT Short Term Goal 2 - Progress (Week 1): Progressing toward goal OT Short Term Goal 3 (Week 1): The pt will safely participate in > 30 minutes of Occupational Therapy  activity with minimal rest breaks following demonstration and initial cues. OT Short Term Goal 3 - Progress (Week 1): Progressing toward goal OT Short Term Goal 4 (Week 1): The pt will safely demonstrate a simple homemaking task at [facility] after demonstration and initial cues. OT Short Term Goal 4 - Progress (Week 1): Progressing toward goal Week 2:  OT Short Term Goal 1 (Week 2): Pt willl be able to stand and manage clothing over hips with min A pre and post toileting. OT Short Term Goal 2 (Week 2): Pt will be able to don pants over feet using reacher with min A. OT Short Term Goal 3 (Week 2): Pt will be able  to walk in and out of the bathroom with CGA and no symptoms of shortness of breath to demonstrate improved activity tolerance.  Skilled Therapeutic Interventions/Progress Updates:    Visit 1:  Pain: mild c/o abdominal pain   Pt received in w/ c from hand off from other therapist .  Pt receiving medications from RN and finishing brushing her teeth. Pt took extended time to take her medication.  Used this time to review the short term goals with pt and discuss her therapy plans for the day.  Pt had taken the medication but after the nurse left pt stated the pills were stuck in her throat and wanted to be suctioned.  Explained the suction does not go into the esophagus and then she would not absorb her medicines. After trying beverages, had pt nibble on a cracker to further swallow.  This also took quite a bit of time.    Asked pt to ambulate to bathroom with Rw. Pt very apprehensive but agreeable after I reassured her she could go 10 ft after walking 47 ft. Yesterday.  Pt cued to push up with B hands and not reach for RW to rise to stand. Pt able to stand up with S and then ambulated with min A to toilet.  She did need to void (documented in flow sheet).  Pt very fatigued as she was mouth breathing and expressing anxiety so she needed mod-max A with clothing management and total A with cleansing.  Pt  cued to do relaxed belly breathing. Had pt sit to rest on toilet as her HR was elevated and O2 had decreased to 88% but resumed to 97% with seated rest break.    Pt then ambulated out to w/c with min A. Pt very fatigued and wanted to sit in wc to rest.  Pt did not want to continue working at this time due to fatigue.    Pt resting in wc with all needs met.  Visit 2: Pain: no c/o pain  Pt received in wc having just finished her group.  Pt agreeable to trying the Nustep.  It took some time to have her get set up for the transfer, transfer to seat and adjust seat to her comfort level.  Nustep set at level 1  and pt cued to try to do 10 min at what ever pace she can handle.  Pt worked for 2 min at a time with short breaks for a total of 8 minutes over a 17 min period. Pt c/o sit pain from sitting on Roho cushion so switched it out for a gel cushion.  Pt then transferred back to wc with min A. Pt returned to room with all needs met.    Therapy Documentation Precautions:  Precautions Precautions: Fall, Other (comment) Precaution/Restrictions Comments: JP drainx2 (L), PEG tube Restrictions Weight Bearing Restrictions Per Provider Order: No    ADL: ADL Equipment Provided: Reacher Eating: Other (comment) (feeding tube) Where Assessed-Eating: Bed level Grooming: Setup Where Assessed-Grooming: Sitting at sink Upper Body Bathing: Setup Where Assessed-Upper Body Bathing: Sitting at sink Lower Body Bathing: Moderate assistance, Minimal assistance Where Assessed-Lower Body Bathing: Edge of bed Upper Body Dressing: Setup Where Assessed-Upper Body Dressing: Edge of bed Lower Body Dressing: Moderate assistance Where Assessed-Lower Body Dressing: Edge of bed Toileting: Minimal assistance, Moderate assistance (based on performance with bathing at sink LOF) Where Assessed-Toileting: Other (Comment) Toilet Transfer: Minimal assistance, Moderate assistance Toilet Transfer Method: Ambulating Toilet Transfer Equipment: Extra wide bedside commode, Grab bars (based on coming from sit to stand at bed and sink LOF) Tub/Shower Transfer: Minimal assistance, Moderate assistance Tub/Shower Transfer Method: Ship broker: Shower seat with back, Walk in Music therapist: Minimal assistance, Moderate assistance Film/video editor Method:  (based on patient's ability to come from sit to stand and ambulate along side the bed) [employer]: Shower seat with back   Therapy/Group: Individual Therapy  Tamsen Reist 08/22/2024, 9:02 AM

## 2024-08-22 NOTE — Progress Notes (Signed)
 Calorie Count Note  Ongoing calorie count ordered. Pt continues to work on improving intake. Pt meeting ~33% of calorie needs and 40% protein needs with PO intake. Pt's energy level seems improved today. Recommend continuing calorie count to monitor progress and assess if any changes in tube feeding regimen can be made as pt approaches discharge. Strongly encouraged PO oral supplements but pt continues to deny due to taste. Recommend discussion of feeding plan with surgical team.  Diet: Soft Supplements: ProSource TF 20  Estimated Nutritional Needs:  Kcal:  2000-2200 Protein:  110-125g    10/9: Breakfast: 50% cheerios, banana, 2% milk (140 kcal, 6 g protein) Lunch: 50% grilled cheese, yogurt (136 kcal, 7 g protein) Dinner: 50% beef stew, rice w/ gravy, peas, cobbler (308 kcal, 11 g protein) Supplements: ProSource (80 kcal, 20 g protein)  Total intake: 664 kcal (33% of minimum estimated needs)  44 protein (40% of minimum estimated needs)    NUTRITION DIAGNOSIS:  Moderate Malnutrition related to acute illness (s/p Whipple) as evidenced by mild fat depletion, mild muscle depletion.   GOAL:  Patient will meet greater than or equal to 90% of their needs  INTERVENTION: Continue Calorie Count  Continue Creon  TID w/ meals to allow for proper absorption of nutrients from meals  Monitor Tube Feeding regimen  Discuss feeding plan with surgical team and care team    Josette Glance, MS, RDN, LDN Clinical Dietitian I Please reach out via secure chat

## 2024-08-22 NOTE — Progress Notes (Signed)
 PICC removed per protocol per MD order. Manual pressure applied for 5 mins. Vaseline gauze, gauze, and Tegaderm applied over insertion site. No bleeding or swelling noted. Instructed patient to remain in bed for thirty mins. Educated patient about S/S of infection and when to call MD; no heavy lifting or pressure on right side for 24 hours; keep dressing dry and intact for 24 hours. Pt verbalized comprehension.

## 2024-08-22 NOTE — Progress Notes (Addendum)
 PROGRESS NOTE   Subjective/Complaints:  Discussed PICC with Gen Surg PA, Tolerating therapy , amb 47' yesterday , appetite very limited <1/3 meals, po fluid < 521ml/24h ROS:  Pt denies SOB,  (+) mild-stable-abd pain, CP, N/V/ (+)C/D, and vision changes    Objective:   No results found.   Recent Labs    08/20/24 0426 08/21/24 0506  WBC 6.7 6.8  HGB 8.9* 8.4*  HCT 29.0* 27.3*  PLT 329 310   Recent Labs    08/21/24 0506  NA 135  K 4.1  CL 105  CO2 20*  GLUCOSE 290*  BUN 24*  CREATININE 1.18*  CALCIUM  8.1*    Intake/Output Summary (Last 24 hours) at 08/22/2024 0906 Last data filed at 08/22/2024 0608 Gross per 24 hour  Intake 630 ml  Output 243 ml  Net 387 ml        Physical Exam: Vital Signs Blood pressure (!) 132/53, pulse 83, temperature 98.1 F (36.7 C), temperature source Oral, resp. rate 18, height 5' 8 (1.727 m), weight 105 kg, SpO2 100%.  General: No acute distress Mood and affect are appropriate Heart: Regular rate and rhythm no rubs murmurs or extra sounds Lungs: Clear to auscultation, breathing unlabored, no rales or wheezes Abdomen: Positive bowel sounds, soft nontender to palpation, mild LUQ distention no tenderness or drainage RLQ drain tubes , midline granulating incision site unchanged  RLQ drains, LLQ J tube mildly tender Extremities: No clubbing, cyanosis, or edema    Skin:  Midline incision covered in clean dressing Drain sites c/d/I- unchanged/improving  10/9         Neuro: AAOx4. No apparent cognitive deficits    Sensory exam: revealed normal sensation in all dermatomal regions in bilateral upper extremities and bilateral lower extremities Motor exam: strength 5-/5 throughout bilateral upper extremities and bilateral lower extremities Coordination: Fine motor coordination was normal.        Assessment/Plan: 1. Functional deficits which require 3+ hours per day of  interdisciplinary therapy in a comprehensive inpatient rehab setting. Physiatrist is providing close team supervision and 24 hour management of active medical problems listed below. Physiatrist and rehab team continue to assess barriers to discharge/monitor patient progress toward functional and medical goals  Care Tool:  Bathing    Body parts bathed by patient: Right arm, Left arm, Chest, Abdomen, Front perineal area, Buttocks, Right upper leg, Left upper leg, Face     Body parts n/a: Right lower leg, Left lower leg   Bathing assist Assist Level: Minimal Assistance - Patient > 75%     Upper Body Dressing/Undressing Upper body dressing   What is the patient wearing?: Pull over shirt    Upper body assist Assist Level: Set up assist    Lower Body Dressing/Undressing Lower body dressing      What is the patient wearing?: Pants     Lower body assist Assist for lower body dressing: Moderate Assistance - Patient 50 - 74%     Toileting Toileting    Toileting assist Assist for toileting: Maximal Assistance - Patient 25 - 49%     Transfers Chair/bed transfer  Transfers assist  Chair/bed transfer activity did not occur: Safety/medical  concerns (2/2 high fatigue)  Chair/bed transfer assist level: Contact Guard/Touching assist     Locomotion Ambulation   Ambulation assist   Ambulation activity did not occur: Safety/medical concerns (2/2 high fatigue)          Walk 10 feet activity   Assist  Walk 10 feet activity did not occur: Safety/medical concerns (2/2 high fatigue)        Walk 50 feet activity   Assist Walk 50 feet with 2 turns activity did not occur: Safety/medical concerns (2/2 high fatigue)         Walk 150 feet activity   Assist Walk 150 feet activity did not occur: Safety/medical concerns         Walk 10 feet on uneven surface  activity   Assist Walk 10 feet on uneven surfaces activity did not occur: Safety/medical concerns (2/2  high fatigue)         Wheelchair     Assist Is the patient using a wheelchair?: Yes Type of Wheelchair: Manual Wheelchair activity did not occur: Safety/medical concerns (2/2 high fatigue)         Wheelchair 50 feet with 2 turns activity    Assist    Wheelchair 50 feet with 2 turns activity did not occur: Safety/medical concerns (2/2 high fatigue)       Wheelchair 150 feet activity     Assist  Wheelchair 150 feet activity did not occur: Safety/medical concerns (2/2 high fatigue)       Blood pressure (!) 132/53, pulse 83, temperature 98.1 F (36.7 C), temperature source Oral, resp. rate 18, height 5' 8 (1.727 m), weight 105 kg, SpO2 100%.   Medical Problem List and Plan: 1. 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  Antithrombotics: -DVT/anticoagulation:  Pharmaceutical: Lovenox              -antiplatelet therapy: N/A 3. Pain Management: Tylenol  prn. Oxycodone  d/c 9/28. Hydrocodone prn.    Pain improving  4. Mood/Behavior/Sleep: LCSW to follow for evaluation and support.              -antipsychotic agents: N/A 5. Neuropsych/cognition: This patient is capable of making decisions on her own behalf. 6. Skin/Wound Care: Monitor wound for healing. PICC site looking ok but may d/c in am since no longer receiving TPN or IVF/IV meds 7. Fluids/Electrolytes/Nutrition: Monitor I/O. Continue tube feeds    Latest Ref Rng & Units 08/21/2024    5:06 AM 08/18/2024    4:30 AM 08/17/2024    5:22 AM  BMP  Glucose 70 - 99 mg/dL 709  713  712   BUN 8 - 23 mg/dL 24  22  23    Creatinine 0.44 - 1.00 mg/dL 8.81  8.97  8.92   Sodium 135 - 145 mmol/L 135  137  138   Potassium 3.5 - 5.1 mmol/L 4.1  4.0  4.2   Chloride 98 - 111 mmol/L 105  105  106   CO2 22 - 32 mmol/L 20  20  21     Calcium  8.9 - 10.3 mg/dL 8.1  7.9  8.2    Creat mildly increase , monitor  8. Duodenal adenocarcinoma w/gastric outlet obst:  continues on tube feed for  nutritional support.  Improved motility passing gas but may have mild ileus per Gen surg, tolerated po vegetables and potatoes yesterday   9. Abdominal distension:  improve motility lass stomach gas, some mild ileus noted per Gen surg Not using hydrocodone and only 50mg  tramadol  per day     Large cont type 5 stool today  10. Fluid overload/Pleural effusions: In part due to third spacing. S/p thoracocentesis.  --Weight 241 at admission-->271-->253 today. Continue daily wts.  --Encourage pulmonary hygiene   11. HTN: Monitor BP TID--on amlodipine , catapres  and coreg              - Add hydralazine  25 mg Q8H PRN for HTN >170 SBP or >100 DBP, given recent highs   10/4-10/5 BP running low 140s- con't regimen Vitals:   08/21/24 2005 08/22/24 0421  BP: (!) 128/54 (!) 132/53  Pulse: 82 83  Resp: 17 18  Temp: 98.3 F (36.8 C) 98.1 F (36.7 C)  SpO2: 100% 100%   Systolic HTN, fair control 10/9 12.  Prediabetes: Hgb A1c- 6.1. Elevated BS due to tube feeds             --continue Lantus  with meal coverage and SSI             --monitor BS ac/hs and use SSI for elevated BS   CBG (last 3)  Recent Labs    08/21/24 1635 08/21/24 2108 08/22/24 0606  GLUCAP 180* 154* 302*  Increase lantus  10/8 Only ate 20% of 1 meal yesterday, no need to adjust TF   13. Acute on chronic kidney disease: SCr 1.3 at admission and now WNL 14.  Obesity  Class 2: BMI 38.47 15.  ABLA: Recheck CBC in am. Hgb 8-9 range.   Stable 10/9    Latest Ref Rng & Units 08/21/2024    5:06 AM 08/20/2024    4:26 AM 08/18/2024    4:30 AM  CBC  WBC 4.0 - 10.5 K/uL 6.8  6.7  6.1   Hemoglobin 12.0 - 15.0 g/dL 8.4  8.9  8.4   Hematocrit 36.0 - 46.0 % 27.3  29.0  26.9   Platelets 150 - 400 K/uL 310  329  301     16. Severe protein malnutrition- J tube placed 9/9 Albumin  of 1.5-  is declining-   10/5- On TF's changed to 2pm to 6am so can feel hungry- but might need higher dose/faster TF's to compensate during the time she's on; 17. Thrush  10/5- added Nystatin  swish and swallow QID as well as Diflucan  400 mg x1 and 200 mg daily x 6 days- per pt, things taste wrong so I feel this is causing that.  Ask dietary to follow oral vs enteral intake      LOS: 7 days A FACE TO FACE EVALUATION WAS PERFORMED  Lauren Gray 08/22/2024, 9:06 AM

## 2024-08-23 ENCOUNTER — Inpatient Hospital Stay (HOSPITAL_COMMUNITY)

## 2024-08-23 LAB — GLUCOSE, CAPILLARY
Glucose-Capillary: 338 mg/dL — ABNORMAL HIGH (ref 70–99)
Glucose-Capillary: 180 mg/dL — ABNORMAL HIGH (ref 70–99)
Glucose-Capillary: 255 mg/dL — ABNORMAL HIGH (ref 70–99)
Glucose-Capillary: 304 mg/dL — ABNORMAL HIGH (ref 70–99)

## 2024-08-23 NOTE — Progress Notes (Signed)
 Jp drain will not create suction. Surgeon did assess patient this morning to address adjacent dislodge of other Jp drain. Unable to reach aforementioned surgeon to discuss non suction. General on call surgeon paged by charge nurse without return phone call to this nurse. Site of missing Jp drain continues with moderated serous drainage requiring x3 dressing and gown changes this shift. Scant purulent drainage present in remaining unsuctioned Jp drain.

## 2024-08-23 NOTE — Progress Notes (Signed)
 PROGRESS NOTE   Subjective/Complaints: 25% breakfast fluid  ROS:  Pt denies SOB,  (+) mild-stable-abd pain, CP, N/V/ (+)C/D, and vision changes    Objective:   DG Abd Portable 1V Result Date: 08/23/2024 CLINICAL DATA:  Surgical dehiscence EXAM: DG ABD PORTABLE 1V COMPARISON:  Abdominal radiograph dated 08/19/2024 FINDINGS: Similar position of left lower quadrant catheter. Interval removal of 1 of the right lateral approach catheters with remaining catheter extending to the left hemiabdomen. A few dilated loops of left upper quadrant bowel. No free air or pneumatosis. No abnormal radio-opaque calculi or mass effect. No acute or substantial osseous abnormality. The sacrum and coccyx are partially obscured by overlying bowel contents. IMPRESSION: 1. Interval removal of 1 of the right lateral approach catheters with remaining catheter extending to the left hemiabdomen. Similar position of left lower quadrant catheter. 2. A few dilated loops of left upper quadrant bowel, likely ileus. Electronically Signed   By: Limin  Xu M.D.   On: 08/23/2024 11:23     Recent Labs    08/21/24 0506  WBC 6.8  HGB 8.4*  HCT 27.3*  PLT 310   Recent Labs    08/21/24 0506  NA 135  K 4.1  CL 105  CO2 20*  GLUCOSE 290*  BUN 24*  CREATININE 1.18*  CALCIUM  8.1*    Intake/Output Summary (Last 24 hours) at 08/23/2024 1237 Last data filed at 08/23/2024 0755 Gross per 24 hour  Intake 420 ml  Output 303 ml  Net 117 ml        Physical Exam: Vital Signs Blood pressure (!) 122/49, pulse 81, temperature 97.8 F (36.6 C), temperature source Oral, resp. rate 16, height 5' 8 (1.727 m), weight 105.1 kg, SpO2 100%.  General: No acute distress Mood and affect are appropriate Heart: Regular rate and rhythm no rubs murmurs or extra sounds Lungs: Clear to auscultation, breathing unlabored, no rales or wheezes Abdomen: Positive bowel sounds,  soft nontender to palpation, mild LUQ distention no tenderness or drainage RLQ drain tubes , midline granulating incision site unchanged  RLQ drains, LLQ J tube mildly tender Extremities: No clubbing, cyanosis, or edema    Skin:  Midline incision covered in clean dressing Drain sites c/d/I- unchanged/improving  10/9         Neuro: AAOx4. No apparent cognitive deficits    Sensory exam: revealed normal sensation in all dermatomal regions in bilateral upper extremities and bilateral lower extremities Motor exam: strength 5-/5 throughout bilateral upper extremities and bilateral lower extremities Coordination: Fine motor coordination was normal.        Assessment/Plan: 1. Functional deficits which require 3+ hours per day of interdisciplinary therapy in a comprehensive inpatient rehab setting. Physiatrist is providing close team supervision and 24 hour management of active medical problems listed below. Physiatrist and rehab team continue to assess barriers to discharge/monitor patient progress toward functional and medical goals  Care Tool:  Bathing    Body parts bathed by patient: Right arm, Left arm, Chest, Abdomen, Front perineal area, Buttocks, Right upper leg, Left upper leg, Face     Body parts n/a: Right lower leg, Left lower leg   Bathing assist Assist Level:  Minimal Assistance - Patient > 75%     Upper Body Dressing/Undressing Upper body dressing   What is the patient wearing?: Pull over shirt    Upper body assist Assist Level: Set up assist    Lower Body Dressing/Undressing Lower body dressing      What is the patient wearing?: Pants     Lower body assist Assist for lower body dressing: Moderate Assistance - Patient 50 - 74%     Toileting Toileting    Toileting assist Assist for toileting: Total Assistance - Patient < 25%     Transfers Chair/bed transfer  Transfers assist  Chair/bed transfer activity did not occur: Safety/medical concerns (2/2  high fatigue)  Chair/bed transfer assist level: Contact Guard/Touching assist     Locomotion Ambulation   Ambulation assist   Ambulation activity did not occur: Safety/medical concerns (2/2 high fatigue)  Assist level: Contact Guard/Touching assist Assistive device: Walker-rolling Max distance: 47   Walk 10 feet activity   Assist  Walk 10 feet activity did not occur: Safety/medical concerns (2/2 high fatigue)  Assist level: Contact Guard/Touching assist Assistive device: Walker-rolling   Walk 50 feet activity   Assist Walk 50 feet with 2 turns activity did not occur: Safety/medical concerns (2/2 high fatigue)         Walk 150 feet activity   Assist Walk 150 feet activity did not occur: Safety/medical concerns         Walk 10 feet on uneven surface  activity   Assist Walk 10 feet on uneven surfaces activity did not occur: Safety/medical concerns (2/2 high fatigue)         Wheelchair     Assist Is the patient using a wheelchair?: Yes Type of Wheelchair: Manual Wheelchair activity did not occur: Safety/medical concerns (2/2 high fatigue)         Wheelchair 50 feet with 2 turns activity    Assist    Wheelchair 50 feet with 2 turns activity did not occur: Safety/medical concerns (2/2 high fatigue)       Wheelchair 150 feet activity     Assist  Wheelchair 150 feet activity did not occur: Safety/medical concerns (2/2 high fatigue)       Blood pressure (!) 122/49, pulse 81, temperature 97.8 F (36.6 C), temperature source Oral, resp. rate 16, height 5' 8 (1.727 m), weight 105.1 kg, SpO2 100%.   Medical Problem List and Plan: 1. 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  Antithrombotics: -DVT/anticoagulation:  Pharmaceutical: Lovenox               -antiplatelet therapy: N/A 3. Pain Management: Tylenol  prn. Oxycodone  d/c 9/28. Hydrocodone prn.    Pain improving  4. Mood/Behavior/Sleep: LCSW to follow for evaluation and support.              -antipsychotic agents: N/A 5. Neuropsych/cognition: This patient is capable of making decisions on her own behalf. 6. Skin/Wound Care: Monitor wound for healing. PICC site looking ok but may d/c in am since no longer receiving TPN or IVF/IV meds 7. Fluids/Electrolytes/Nutrition: Monitor I/O. Continue tube feeds    Latest Ref Rng & Units 08/21/2024    5:06 AM 08/18/2024    4:30 AM 08/17/2024  5:22 AM  BMP  Glucose 70 - 99 mg/dL 709  713  712   BUN 8 - 23 mg/dL 24  22  23    Creatinine 0.44 - 1.00 mg/dL 8.81  8.97  8.92   Sodium 135 - 145 mmol/L 135  137  138   Potassium 3.5 - 5.1 mmol/L 4.1  4.0  4.2   Chloride 98 - 111 mmol/L 105  105  106   CO2 22 - 32 mmol/L 20  20  21    Calcium  8.9 - 10.3 mg/dL 8.1  7.9  8.2    Creat mildly increase , monitor  8. Duodenal adenocarcinoma w/gastric outlet obst:  continues on tube feed for nutritional support.  Improved motility passing gas but may have mild ileus per Gen surg, tolerated po vegetables and potatoes yesterday   9. Abdominal distension:  improve motility lass stomach gas, some mild ileus noted per Gen surg Not using hydrocodone and only 50mg  tramadol  per day     Large cont type 5 stool today  10. Fluid overload/Pleural effusions: In part due to third spacing. S/p thoracocentesis.  --Weight 241 at admission-->271-->253 today. Continue daily wts.  --Encourage pulmonary hygiene   11. HTN: Monitor BP TID--on amlodipine , catapres  and coreg              - Add hydralazine  25 mg Q8H PRN for HTN >170 SBP or >100 DBP, given recent highs   10/4-10/5 BP running low 140s- con't regimen Vitals:   08/23/24 0006 08/23/24 0300  BP:  (!) 122/49  Pulse:  81  Resp:  16  Temp: 98.6 F (37 C) 97.8 F (36.6 C)  SpO2:  100%  Controlled 10/11 12.   Prediabetes: Hgb A1c- 6.1. Elevated BS due to tube feeds             --continue Lantus  with meal coverage and SSI             --monitor BS ac/hs and use SSI for elevated BS   CBG (last 3)  Recent Labs    08/22/24 2036 08/23/24 0544 08/23/24 1159  GLUCAP 276* 338* 180*  Increase lantus  10/8 Only ate 20% of 1 meal yesterday, no need to adjust TF   13. Acute on chronic kidney disease: SCr 1.3 at admission and now WNL 14.  Obesity  Class 2: BMI 38.47 15.  ABLA: Recheck CBC in am. Hgb 8-9 range.   Stable 10/9    Latest Ref Rng & Units 08/21/2024    5:06 AM 08/20/2024    4:26 AM 08/18/2024    4:30 AM  CBC  WBC 4.0 - 10.5 K/uL 6.8  6.7  6.1   Hemoglobin 12.0 - 15.0 g/dL 8.4  8.9  8.4   Hematocrit 36.0 - 46.0 % 27.3  29.0  26.9   Platelets 150 - 400 K/uL 310  329  301     16. Severe protein malnutrition- J tube placed 9/9 Albumin  of 1.5- is declining-    17. Thrush  10/5- added Nystatin  swish and swallow QID as well as Diflucan  400 mg x1 and 200 mg daily x 6 days- per pt, things taste wrong so I feel this is causing that.  Ask dietary to follow oral vs enteral intake      LOS: 8 days A FACE TO FACE EVALUATION WAS PERFORMED  Prentice FORBES Compton 08/23/2024, 12:37 PM

## 2024-08-23 NOTE — Progress Notes (Signed)
 Physical Therapy Session Note  Patient Details  Name: Lauren Gray MRN: [id number] Date of Birth: 11/26/1950  Today's Date: 08/23/2024 PT Individual Time: 0913-1000 PT Individual Time Calculation (min): 47 min  Today's Date: 08/23/2024 PT Missed Time: 13 Minutes Missed Time Reason: Nursing care  Short Term Goals: Week 1:  PT Short Term Goal 1 (Week 1): Pt will complete bed mobility using CGA PT Short Term Goal 2 (Week 1): Pt will complete SPT to chair/WC using CGA + LRAD PT Short Term Goal 3 (Week 1): Pt will amb 64ft using CGA + LRAD PT Short Term Goal 4 (Week 1): Pt will initiate stair training  Skilled Therapeutic Interventions/Progress Updates: Patient sitting EOB with nsg assissting on entrance to room. Patient alert and agreeable to PT session.   Patient unrated pain (denied want of medication). Rest breaks provided  Therapeutic Activity: Transfers: Pt performed sit<>stand transfers throughout session with CGA and VC for hand placement. Pt stood in RW with CGA for safety while pt donned personal pants to waist.  - Pt ambulated 75' in day room loop to nsg station with CGA for safety in RW and WC follow. Pt recalled cue to maintain safe proximity to RW. Pt with forward flexed posture. Pt performed in order to improve endurance to household distances. - sit<>stand from hi/low mat to RW with emphasis on increasing and maintaining upright standing posture and depressing B UE's. Pt performed x 2. Pt required CGA (use of back of knees on edge of mat). Pt could only tolerate less than 30s each time standing before needing rest break. Pt required return to room for toileting. Pt transported back to room in [facility] and ambulated short distance in room in RW with CGA and doffed personal pant. Pt JP bulb still leaking and PTA donned medical tape to cap to decrease leaking (nsg notified).   Patient sitting on toilet with nsg assisting with personal care.      Therapy Documentation Precautions:   Precautions Precautions: Fall, Other (comment) Precaution/Restrictions Comments: JP drainx2 (L), PEG tube Restrictions Weight Bearing Restrictions Per Provider Order: No  Therapy/Group: Individual Therapy  Dailyn Reith PTA 08/23/2024, 12:27 PM

## 2024-08-23 NOTE — Progress Notes (Signed)
   Subjective/Chief Complaint: Pt doing well Pt tol PO and TFs Drain #1 fell out overnight   Objective: Vital signs in last 24 hours: Temp:  [97.8 F (36.6 C)-98.6 F (37 C)] 97.8 F (36.6 C) (10/11 0300) Pulse Rate:  [81-88] 81 (10/11 0300) Resp:  [16-17] 16 (10/11 0300) BP: (122-136)/(47-55) 122/49 (10/11 0300) SpO2:  [98 %-100 %] 100 % (10/11 0300) Weight:  [105.1 kg] 105.1 kg (10/11 0559) Last BM Date : 08/21/24  Intake/Output from previous day: 10/10 0701 - 10/11 0700 In: 300 [NG/GT:300] Out: 303 [Drains:303] Intake/Output this shift: Total I/O In: 120 [P.O.:120] Out: -   PE:  Constitutional: No acute distress, conversant, appears states age. Eyes: Anicteric sclerae, moist conjunctiva, no lid lag Lungs: Clear to auscultation bilaterally, normal respiratory effort CV: regular rate and rhythm, no murmurs, no peripheral edema, pedal pulses 2+ GI: Soft, no masses or hepatosplenomegaly, non-tender to palpation, Dr #2 in place and with milky output, some clear drainage from #1 drain site Skin: No rashes, palpation reveals normal turgor Psychiatric: appropriate judgment and insight, oriented to person, place, and time   Lab Results:  Recent Labs    08/21/24 0506  WBC 6.8  HGB 8.4*  HCT 27.3*  PLT 310   BMET Recent Labs    08/21/24 0506  NA 135  K 4.1  CL 105  CO2 20*  GLUCOSE 290*  BUN 24*  CREATININE 1.18*  CALCIUM  8.1*  Anti-infectives: Anti-infectives (From admission, onward)    Start     Dose/Rate Route Frequency Ordered Stop   08/18/24 0800  fluconazole  (DIFLUCAN ) tablet 200 mg        200 mg Oral Daily 08/17/24 0819     08/17/24 1000  fluconazole  (DIFLUCAN ) tablet 400 mg        400 mg Oral  Once 08/17/24 0819 08/17/24 1002       Assessment/Plan: s/p Whipple,  jejunostomy tube Dr. 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  Margins  negative.  18 h tube feed cycle for full calorie intake; currently decreased to 14 hours  Calorie counts.  Megace for appetite stimulant.  Decreased tube feeds 10/9 as above. -Dr #2 in place and appears to be the one controlling her leak. -Will obtain KUB to be sure whole drain came out.    Dispo - per PM&R.   LOS: 8 days    Lynda Leos 08/23/2024

## 2024-08-23 NOTE — Progress Notes (Signed)
 Physical Therapy Weekly Progress Note  Patient Details  Name: Lauren Gray MRN: [id number] Date of Birth: 1951/05/24  Beginning of progress report period: August 16, 2024 End of progress report period: August 23, 2024  Patient has met 3 of 4 short term goals. Pt is making functional progress towards LTG's. Pt has initiated stair training by performing step to 6 step with CGA in RW (pt still needing to build up strength/endurance, but will attempt stair navigation next week if deemed appropriate). Pt is improving activity tolerance with standing activities, but still limited by decreased B LE strength. Pt is progressing towards bed mobility goal and difficult to accurately assess as pt currently has air mattress (has been transitioning to EOB with HOB elevated at supervision level- will further assess this next week).   Patient continues to demonstrate the following deficits muscle weakness, decreased cardiorespiratoy endurance, and decreased standing balance and decreased balance strategies and therefore will continue to benefit from skilled PT intervention to increase functional independence with mobility.  Patient progressing toward long term goals..  Continue plan of care.  PT Short Term Goals Week 1:  PT Short Term Goals - Week 1 PT Short Term Goal 1 (Week 1): Pt will complete bed mobility using CGA PT Short Term Goal 1 - Progress (Week 1): Progressing toward goal PT Short Term Goal 2 (Week 1): Pt will complete SPT to chair/WC using CGA + LRAD PT Short Term Goal 2 - Progress (Week 1): Met PT Short Term Goal 3 (Week 1): Pt will amb 63ft using CGA + LRAD PT Short Term Goal 3 - Progress (Week 1): Met PT Short Term Goal 4 (Week 1): Pt will initiate stair training PT Short Term Goal 4 - Progress (Week 1): Met PT Short Term Goals - Week 2 PT Short Term Goal 1 (Week 2): STG = LTG d/t ELOS  Skilled Therapeutic Interventions/Progress Updates:  Ambulation/gait training;Balance/vestibular  training;Community reintegration;Discharge planning;Disease management/prevention;Cognitive remediation/compensation;DME/adaptive equipment instruction;Functional mobility training;Neuromuscular re-education;Pain management;Patient/family education;Psychosocial support;Skin care/wound management;Splinting/orthotics;Stair training;Therapeutic Activities;Therapeutic Exercise;UE/LE Strength taining/ROM;UE/LE Coordination activities;Wheelchair propulsion/positioning   Therapy Documentation Precautions:  Precautions Precautions: Fall, Other (comment) Precaution/Restrictions Comments: JP drainx2 (L), PEG tube Restrictions Weight Bearing Restrictions Per Provider Order: No  Geovanny Sartin PTA Mliss DELENA Milliner PT, DPT, CSRS 08/23/2024, 12:57 PM

## 2024-08-24 LAB — GLUCOSE, CAPILLARY
Glucose-Capillary: 364 mg/dL — ABNORMAL HIGH (ref 70–99)
Glucose-Capillary: 145 mg/dL — ABNORMAL HIGH (ref 70–99)
Glucose-Capillary: 251 mg/dL — ABNORMAL HIGH (ref 70–99)
Glucose-Capillary: 270 mg/dL — ABNORMAL HIGH (ref 70–99)

## 2024-08-24 MED ORDER — SIMETHICONE 40 MG/0.6ML PO SUSP
80.0000 mg | Freq: Three times a day (TID) | ORAL | Status: DC
  Administered 2024-08-24 – 2024-09-02 (×32): 80 mg via ORAL
  Filled 2024-08-24 (×43): qty 1.2

## 2024-08-24 MED ORDER — INSULIN GLARGINE 100 UNIT/ML ~~LOC~~ SOLN
20.0000 [IU] | Freq: Two times a day (BID) | SUBCUTANEOUS | Status: DC
  Administered 2024-08-24 – 2024-08-26 (×4): 20 [IU] via SUBCUTANEOUS
  Filled 2024-08-24 (×5): qty 0.2

## 2024-08-24 MED ORDER — BISACODYL 10 MG RE SUPP
10.0000 mg | Freq: Once | RECTAL | Status: DC
  Filled 2024-08-24: qty 1

## 2024-08-24 NOTE — Progress Notes (Signed)
 Slept good. JP drain #2, is still in place. Minimal drainage from drain or from around drain site. Bulky dressing still in place. Dressing changed to dehisced area at distal end of abdominal incision, steri strips to rest of incision. At 2052, prn compazine  10mg 's given for complaint of nausea. Incontinent of urine x 2 this shift. At 0430, up to [facility], large type 6 BM. Osmolite infusing without problems. Husband at bedside.Tammy Ericsson A

## 2024-08-24 NOTE — Progress Notes (Signed)
 PROGRESS NOTE   Subjective/Complaints: Appreciate surgery note  Pt did ADLs with sup today! A little emotional per OT today   ROS:  Pt denies SOB,  (+) mild-stable-abd pain, CP, N/V/ (+)C/D, and vision changes    Objective:   DG Abd Portable 1V Result Date: 08/23/2024 CLINICAL DATA:  Surgical dehiscence EXAM: DG ABD PORTABLE 1V COMPARISON:  Abdominal radiograph dated 08/19/2024 FINDINGS: Similar position of left lower quadrant catheter. Interval removal of 1 of the right lateral approach catheters with remaining catheter extending to the left hemiabdomen. A few dilated loops of left upper quadrant bowel. No free air or pneumatosis. No abnormal radio-opaque calculi or mass effect. No acute or substantial osseous abnormality. The sacrum and coccyx are partially obscured by overlying bowel contents. IMPRESSION: 1. Interval removal of 1 of the right lateral approach catheters with remaining catheter extending to the left hemiabdomen. Similar position of left lower quadrant catheter. 2. A few dilated loops of left upper quadrant bowel, likely ileus. Electronically Signed   By: Limin  Xu M.D.   On: 08/23/2024 11:23     No results for input(s): WBC, HGB, HCT, PLT in the last 72 hours.  No results for input(s): NA, K, CL, CO2, GLUCOSE, BUN, CREATININE, CALCIUM  in the last 72 hours.   Intake/Output Summary (Last 24 hours) at 08/24/2024 1047 Last data filed at 08/24/2024 0700 Gross per 24 hour  Intake 780 ml  Output --  Net 780 ml        Physical Exam: Vital Signs Blood pressure (!) 134/58, pulse 84, temperature 99.7 F (37.6 C), temperature source Oral, resp. rate 16, height 5' 8 (1.727 m), weight 105 kg, SpO2 95%.  General: No acute distress Mood and affect are appropriate Heart: Regular rate and rhythm no rubs murmurs or extra sounds Lungs: Clear to auscultation, breathing unlabored, no rales or  wheezes Abdomen: Positive bowel sounds, soft nontender to palpation, mild LUQ distention no tenderness or drainage RLQ drain tubes , midline granulating incision site unchanged  RLQ drains, LLQ J tube mildly tender Extremities: No clubbing, cyanosis, or edema    Skin:  Midline incision covered in clean dressing Drain sites c/d/I- unchanged/improving  10/9         Neuro: AAOx4. No apparent cognitive deficits    Sensory exam: revealed normal sensation in all dermatomal regions in bilateral upper extremities and bilateral lower extremities Motor exam: strength 5-/5 throughout bilateral upper extremities and bilateral lower extremities Coordination: Fine motor coordination was normal.        Assessment/Plan: 1. Functional deficits which require 3+ hours per day of interdisciplinary therapy in a comprehensive inpatient rehab setting. Physiatrist is providing close team supervision and 24 hour management of active medical problems listed below. Physiatrist and rehab team continue to assess barriers to discharge/monitor patient progress toward functional and medical goals  Care Tool:  Bathing    Body parts bathed by patient: Right arm, Left arm, Chest, Abdomen, Front perineal area, Buttocks, Right upper leg, Left upper leg, Face     Body parts n/a: Right lower leg, Left lower leg   Bathing assist Assist Level: Minimal Assistance - Patient > 75%  Upper Body Dressing/Undressing Upper body dressing   What is the patient wearing?: Pull over shirt    Upper body assist Assist Level: Set up assist    Lower Body Dressing/Undressing Lower body dressing      What is the patient wearing?: Pants     Lower body assist Assist for lower body dressing: Moderate Assistance - Patient 50 - 74%     Toileting Toileting    Toileting assist Assist for toileting: Total Assistance - Patient < 25%     Transfers Chair/bed transfer  Transfers assist  Chair/bed transfer activity did  not occur: Safety/medical concerns (2/2 high fatigue)  Chair/bed transfer assist level: Supervision/Verbal cueing     Locomotion Ambulation   Ambulation assist   Ambulation activity did not occur: Safety/medical concerns (2/2 high fatigue)  Assist level: Contact Guard/Touching assist Assistive device: Walker-rolling Max distance: 75   Walk 10 feet activity   Assist  Walk 10 feet activity did not occur: Safety/medical concerns (2/2 high fatigue)  Assist level: Contact Guard/Touching assist Assistive device: Walker-rolling   Walk 50 feet activity   Assist Walk 50 feet with 2 turns activity did not occur: Safety/medical concerns (2/2 high fatigue)  Assist level: Contact Guard/Touching assist Assistive device: Walker-rolling    Walk 150 feet activity   Assist Walk 150 feet activity did not occur: Safety/medical concerns         Walk 10 feet on uneven surface  activity   Assist Walk 10 feet on uneven surfaces activity did not occur: Safety/medical concerns (2/2 high fatigue)         Wheelchair     Assist Is the patient using a wheelchair?: Yes Type of Wheelchair: Manual Wheelchair activity did not occur: Safety/medical concerns (2/2 high fatigue)         Wheelchair 50 feet with 2 turns activity    Assist    Wheelchair 50 feet with 2 turns activity did not occur: Safety/medical concerns (2/2 high fatigue)       Wheelchair 150 feet activity     Assist  Wheelchair 150 feet activity did not occur: Safety/medical concerns (2/2 high fatigue)       Blood pressure (!) 134/58, pulse 84, temperature 99.7 F (37.6 C), temperature source Oral, resp. rate 16, height 5' 8 (1.727 m), weight 105 kg, SpO2 95%.   Medical Problem List and Plan: 1. 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  Antithrombotics: -DVT/anticoagulation:  Pharmaceutical: Lovenox              -antiplatelet therapy: N/A 3. Pain Management: Tylenol  prn. Oxycodone  d/c 9/28. Hydrocodone prn.    Pain improving  4. Mood/Behavior/Sleep: LCSW to follow for evaluation and support.              -antipsychotic agents: N/A 5. Neuropsych/cognition: This patient is capable of making decisions on her own behalf. 6. Skin/Wound Care: Monitor wound for healing. PICC site looking ok but may d/c in am since no longer receiving TPN or IVF/IV meds 7. Fluids/Electrolytes/Nutrition: Monitor I/O. Continue tube feeds    Latest Ref Rng & Units 08/21/2024    5:06 AM 08/18/2024    4:30 AM 08/17/2024    5:22 AM  BMP  Glucose 70 - 99 mg/dL 709  713  712   BUN 8 - 23 mg/dL 24  22  23    Creatinine 0.44 - 1.00 mg/dL 8.81  8.97  8.92   Sodium 135 - 145 mmol/L 135  137  138   Potassium 3.5 - 5.1 mmol/L 4.1  4.0  4.2   Chloride 98 - 111 mmol/L 105  105  106   CO2 22 - 32 mmol/L 20  20  21    Calcium  8.9 - 10.3 mg/dL 8.1  7.9  8.2    Creat mildly increase , monitor  8. Duodenal adenocarcinoma w/gastric outlet obst:  continues on tube feed for nutritional support.  Improved motility passing gas but may have mild ileus per Gen surg, tolerated po vegetables and potatoes yesterday   9. Abdominal distension:  improve motility lass stomach gas, some mild ileus noted per Gen surg Not using hydrocodone and only 50mg  tramadol  per day     Large cont type 5 stool today  10. Fluid overload/Pleural effusions: In part due to third spacing. S/p thoracocentesis.  --Weight 241 at admission-->271-->253 today. Continue daily wts.  --Encourage pulmonary hygiene   11. HTN: Monitor BP TID--on amlodipine , catapres  and coreg              - Add hydralazine  25 mg Q8H PRN for HTN >170 SBP or >100 DBP, given recent highs   10/4-10/5 BP running low 140s- con't regimen Vitals:   08/23/24 1939 08/24/24 0409  BP: (!) 149/63 (!)  134/58  Pulse: 86 84  Resp: 16 16  Temp: 97.9 F (36.6 C) 99.7 F (37.6 C)  SpO2: 100% 95%  Controlled 10/11 12.  Prediabetes: Hgb A1c- 6.1. Elevated BS due to tube feeds             --continue Lantus  with meal coverage and SSI             --monitor BS ac/hs and use SSI for elevated BS   CBG (last 3)  Recent Labs    08/23/24 1623 08/23/24 2038 08/24/24 0555  GLUCAP 255* 304* 364*  Increase lantus  10/12 to 20U, minimal po intake , no change in TF since 10/9  13. Acute on chronic kidney disease: SCr 1.3 at admission and now WNL 14.  Obesity  Class 2: BMI 38.47 15.  ABLA: Recheck CBC in am. Hgb 8-9 range.   Stable 10/9    Latest Ref Rng & Units 08/21/2024    5:06 AM 08/20/2024    4:26 AM 08/18/2024    4:30 AM  CBC  WBC 4.0 - 10.5 K/uL 6.8  6.7  6.1   Hemoglobin 12.0 - 15.0 g/dL 8.4  8.9  8.4   Hematocrit 36.0 - 46.0 % 27.3  29.0  26.9   Platelets 150 - 400 K/uL 310  329  301     16. Severe protein malnutrition- J tube placed 9/9 Albumin  of 1.5- is declining-    17. Thrush  10/5- added Nystatin  swish and swallow QID as well as Diflucan  400 mg x1 and 200 mg daily x 6 days- per pt, things taste wrong so I feel this is causing that.  Ask dietary to follow oral vs enteral intake      LOS: 9 days A FACE TO FACE EVALUATION WAS PERFORMED  Prentice FORBES Compton 08/24/2024, 10:47 AM

## 2024-08-24 NOTE — Progress Notes (Signed)
 Occupational Therapy Session Note  Patient Details  Name: RECIA SONS MRN: [id number] Date of Birth: Dec 03, 1950  Today's Date: 08/24/2024 OT Individual Time: 1015-1120 OT Individual Time Calculation (min): 65 min    Short Term Goals: Week 1:  OT Short Term Goal 1 (Week 1): The pt safely transfer to all surface with MinA using AE as needed. OT Short Term Goal 1 - Progress (Week 1): Met OT Short Term Goal 2 (Week 1): The pt will bathe/dress LB safely with MinA using AE as needed.. OT Short Term Goal 2 - Progress (Week 1): Progressing toward goal OT Short Term Goal 3 (Week 1): The pt will safely participate in > 30 minutes of Occupational Therapy  activity with minimal rest breaks following demonstration and initial cues. OT Short Term Goal 3 - Progress (Week 1): Progressing toward goal OT Short Term Goal 4 (Week 1): The pt will safely demonstrate a simple homemaking task at [facility] after demonstration and initial cues. OT Short Term Goal 4 - Progress (Week 1): Progressing toward goal  Skilled Therapeutic Interventions/Progress Updates:     Initial Encounter: Patient seated on the potty chair at the time of arrival very emotional this AM. Patient encouraged to allow herself to feel, but   to focus on her objectives of improving her outcome.   BADL In Bathing : The pt was able to come from sit to stand using the bed rails for peri care front and back. The pt was able to bathing her front and back private are with s/uA.  The pt was able to transfer from standing to placement at w/c LOF with CGA. The pt was transported to the sink are and was able to wash her face and UB with s/uA .  The was able to apply deo and lotion with s/uA.  The pt was able to brush her teeth with s/uA.   BADL In Dressing: The pt was able to donn her over head shirt with MinA with attention to her drains. The pt was Dep for donning her brief, and  she was able to thread her feet through the opening of the pants with ModA to  MinA using opposite leg, opposite arm for > range.  The pt was able to donn the pants over her hips and bottom with MinA. The pt was Dep for her compression hose and non-skid sock. The pt footrest were put in place with the call light and all additional needs addressed prior to exiting the room.   Therapy Documentation Precautions:  Precautions Precautions: Fall, Other (comment) Precaution/Restrictions Comments: JP drainx2 (L), PEG tube Restrictions Weight Bearing Restrictions Per Provider Order: No Other Treatments:     Therapy/Group: Individual Therapy  Elvera JONETTA Mace 08/24/2024, 11:46 AM

## 2024-08-24 NOTE — Progress Notes (Signed)
 Drain was not holding suction Small cut near the stitch was seen where the the bulb was losing suction. I placed a small tegaderm which held suction thereafter.

## 2024-08-24 NOTE — Progress Notes (Addendum)
 Patient had green liquid emesis episode. MD made aware and on call surgeon paged.Ducolax suppository ordered. Patient declined suppository until morning. She states she had two large BM yesterday. She will take suppository in the morning if she does not have a BM this evening. MD notified

## 2024-08-25 ENCOUNTER — Inpatient Hospital Stay (HOSPITAL_COMMUNITY)

## 2024-08-25 DIAGNOSIS — E440 Moderate protein-calorie malnutrition: Secondary | ICD-10-CM

## 2024-08-25 DIAGNOSIS — I10 Essential (primary) hypertension: Secondary | ICD-10-CM

## 2024-08-25 DIAGNOSIS — T8131XD Disruption of external operation (surgical) wound, not elsewhere classified, subsequent encounter: Secondary | ICD-10-CM

## 2024-08-25 LAB — CBC
HCT: 26.4 % — ABNORMAL LOW (ref 36.0–46.0)
Hemoglobin: 8.4 g/dL — ABNORMAL LOW (ref 12.0–15.0)
MCH: 27.5 pg (ref 26.0–34.0)
MCHC: 31.8 g/dL (ref 30.0–36.0)
MCV: 86.6 fL (ref 80.0–100.0)
Platelets: 422 K/uL — ABNORMAL HIGH (ref 150–400)
RBC: 3.05 MIL/uL — ABNORMAL LOW (ref 3.87–5.11)
RDW: 15.6 % — ABNORMAL HIGH (ref 11.5–15.5)
WBC: 7.8 K/uL (ref 4.0–10.5)
nRBC: 0 % (ref 0.0–0.2)

## 2024-08-25 LAB — GLUCOSE, CAPILLARY
Glucose-Capillary: 341 mg/dL — ABNORMAL HIGH (ref 70–99)
Glucose-Capillary: 129 mg/dL — ABNORMAL HIGH (ref 70–99)
Glucose-Capillary: 152 mg/dL — ABNORMAL HIGH (ref 70–99)
Glucose-Capillary: 268 mg/dL — ABNORMAL HIGH (ref 70–99)

## 2024-08-25 LAB — BASIC METABOLIC PANEL WITH GFR
Anion gap: 11 (ref 5–15)
BUN: 18 mg/dL (ref 8–23)
CO2: 17 mmol/L — ABNORMAL LOW (ref 22–32)
Calcium: 8.1 mg/dL — ABNORMAL LOW (ref 8.9–10.3)
Chloride: 103 mmol/L (ref 98–111)
Creatinine, Ser: 0.99 mg/dL (ref 0.44–1.00)
GFR, Estimated: >60 mL/min (ref >60)
Glucose, Bld: 359 mg/dL — ABNORMAL HIGH (ref 70–99)
Potassium: 5 mmol/L (ref 3.5–5.1)
Sodium: 131 mmol/L — ABNORMAL LOW (ref 135–145)

## 2024-08-25 MED ORDER — IOHEXOL 350 MG/ML SOLN
75.0000 mL | Freq: Once | INTRAVENOUS | Status: AC | PRN
  Administered 2024-08-25: 100 mL via INTRAVENOUS

## 2024-08-25 NOTE — Progress Notes (Signed)
 Pt on D9 of fluconazole  for thrush. Ok to stop per Dr. Babs.  Sergio Batch, PharmD, BCIDP, AAHIVP, CPP Infectious Disease Pharmacist 08/25/2024 9:59 AM

## 2024-08-25 NOTE — Progress Notes (Signed)
 Occupational Therapy Session Note  Patient Details  Name: Lauren Gray MRN: [id number] Date of Birth: 1951-06-19  Today's Date: 08/25/2024 OT Individual Time: 9052-8954 OT Individual Time Calculation (min): 58 min    Short Term Goals: Week 2:  OT Short Term Goal 1 (Week 2): Pt willl be able to stand and manage clothing over hips with min A pre and post toileting. OT Short Term Goal 2 (Week 2): Pt will be able to don pants over feet using reacher with min A. OT Short Term Goal 3 (Week 2): Pt will be able to walk in and out of the bathroom with CGA and no symptoms of shortness of breath to demonstrate improved activity tolerance.  Skilled Therapeutic Interventions/Progress Updates:   Pt greeted seated in recliner, pt agreeable to OT intervention.    Pts husband present during session.   Transfers/bed mobility/functional mobility:  Heavy MODA needed to stand from low recliner and cues for hand placement. Pt completed stand pivot to w/c with RW and CGA.  Pt completed functional ambulation in hallway ~ 30 ft to facilitate improved global endurance with RW and CGA.   Pt completed ambulatory transfer to flat [facility] in apt with CGA with Rw. Pt able to transition from supine<>sit with CGA, pt does report increased pain in abd during transition. Pt thinking about getting an adjustable mattress for home. Did educate pt/husband on bed rail for home with handout provided in case bed mobility continued to be painful. Pt reports all of the furniture in home is high and declined practicing standing from low furniture in apt.   Exercises:  Pt completed 3 mins of continuous BUE GM movement to facilitate improved global endurance with pt instructed to use 3lb weighted dowel to pass ball back/forth. Pt needed one rest break during 3 min interval.                  Ended session with pt seated in recliner with all needs within reach.   Therapy Documentation Precautions:  Precautions Precautions: Fall,  Other (comment) Precaution/Restrictions Comments: JP drainx2 (L), PEG tube Restrictions Weight Bearing Restrictions Per Provider Order: No  Pain: Unrated pain reported from nausea, rest breaks provided as needed.    Therapy/Group: Individual Therapy  Ronal Mallie Needy 08/25/2024, 12:21 PM

## 2024-08-25 NOTE — Progress Notes (Signed)
+/-   sleep. 2 large, type 6 BM's on my shift. Refused supp. Continent & incontinent of urine. Getting up to [facility]. 10cc's out of JP 2 drain, milky. Bulb on JP drain is holding a charge. PRN compazine  given at 0042 for nausea, no emesis. Tearful this morning, feeling a little overwhelmed. Osmolite 1.5 at 60cc's/hr 2pm-6am. Declined SCD's.Husband at bedside. Xandra Laramee A

## 2024-08-25 NOTE — Progress Notes (Signed)
 Physical Therapy Session Note  Patient Details  Name: Lauren Gray MRN: [id number] Date of Birth: 18-Jan-1951  Today's Date: 08/25/2024 PT Individual Time: 9196-9084 PT Individual Time Calculation (min): 72 min   Short Term Goals: Week 1:  PT Short Term Goal 1 (Week 1): Pt will complete bed mobility using CGA PT Short Term Goal 1 - Progress (Week 1): Progressing toward goal PT Short Term Goal 2 (Week 1): Pt will complete SPT to chair/WC using CGA + LRAD PT Short Term Goal 2 - Progress (Week 1): Met PT Short Term Goal 3 (Week 1): Pt will amb 61ft using CGA + LRAD PT Short Term Goal 3 - Progress (Week 1): Met PT Short Term Goal 4 (Week 1): Pt will initiate stair training PT Short Term Goal 4 - Progress (Week 1): Met Week 2:  PT Short Term Goal 1 (Week 2): STG = LTG d/t ELOS  Skilled Therapeutic Interventions/Progress Updates:  Patient supine in bed on entrance to room. Patient alert and agreeable to PT session.   Patient with continued pain complaint in abdomen with mobility at start of session. Is very cautious of all movement. Relates one of her bulb suction lines dislodged and fell out on Sat. This was the line that was most active for drainage. MD and surgeon following.   Pt continues to keep therapy schedule where she cannot refer to it, but rather in safe spot in 3-ring binder on wall across from bed. Continually needs to refer for timing of sessions and does not want to lose it so it remains in the binder. May need to write schedule on white board in order for pt to refer to timing of schedule to help with pain medication administration.   Therapeutic Activity: Bed Mobility: Pt performed supine > sit with supervision and required extra time with no vc to complete. While seated EOB, pt dressed for day requiring MaxA for TED hose, socks/ shoes. ModA for pants and MinA for shirt. All requiring extra time to complete. Pt requested glove tied to PEG tube to prevent leaking.  Transfers:  Pt performed sit<>stand and stand pivot transfers to/ from w/c throughout session with close supervision from slightly elevated bed surface or from w/c with use of armrests. Minimal vc required.   Gait Training:  Pt guided in stair training. Pt instructed to perform one step but then will assess and decide whether to descend one step today or if she will continue with steps using BHR. She is able to ascend one step leading with RLE with heavy BUE pull and then lean into with forearms initially and improves to hand support only. Standing rest break at top of steps and then able to descend with noted control and heavy BUE pressure with step-to gait pattern and guard provided to R knee but no signs of buckle throughout. Pt very fatigued following and requires time to return to baseline breathing rate.   Once rested, pt able to ambulate 34' x1 using RE with CGA and close w/c follow for fatigue. Demo'd very slow but consistent pace with heavy BUE lean into RW handles. Low but adequate step height with step-through pattern and flexed posture. Following seated rest break for fatigue and return to baseilne resp rate, pt relates that she has no more energy to complete more, and so returned to room.   On return trip to room via dependent w/c transport, pt requests to stop ~30 ft from room and would like to ambulate into room. Pt  is able to complete similar to above quality of gait and completes 63' with close w/c follow back into room. Requests to maintain upright seated position in w/c to await next therapy session   Patient ated upright in recliner at end of session with brakes locked, BLE elevated, no alarm set as pt being supervised by husband, and all needs within reach.   Therapy Documentation Precautions:  Precautions Precautions: Fall, Other (comment) Precaution/Restrictions Comments: JP drainx2 (L), PEG tube Restrictions Weight Bearing Restrictions Per Provider Order: No  Pain: Mild/ moderate  pain observed during session with pt moving slowly but no related pain.   Therapy/Group: Individual Therapy  Mliss DELENA Milliner PT, DPT, CSRS 08/25/2024, 12:28 PM

## 2024-08-25 NOTE — Progress Notes (Signed)
 Nutrition Follow Up  DOCUMENTATION CODES:   Non-severe (moderate) malnutrition in context of chronic illness, Obesity unspecified  INTERVENTION:  Recommend discussing advancement of diet from soft to regular to provide increased options for pt and promote increased PO intake during day where pt receives Creon  and proper insulin  coverage  Encouraged pt to have family bring in milkshakes/smoothies to help with calorie intake Pt agreeable to trying Vanilla/Strawberry Mighty Shakes Mighty Shake TID with meals, each supplement provides 330 kcals and 9 grams of protein  Continue calorie count and monitoring pt's PO intake  Continue nocturnal tube feeding via J tube: recommend decreasing run time for feeds to give pt more time disconnected and promote PO intake during day Osmolite 1.5 at 55 ml/h x 12h/day (from 6pm-6am) (660 ml per day) Prosource TF20 60 ml BID Provides 1150 kcal (meets 57% kcal needs), 81 gm protein (meets 74% protein needs), 502 ml free water  daily  Recommend increasing Creon  frequency or looking into Relizorb to promote proper absorption of continuous feeds  NUTRITION DIAGNOSIS:   Moderate Malnutrition related to acute illness (s/p Whipple) as evidenced by mild fat depletion, mild muscle depletion. Remains applicable  GOAL:   Patient will meet greater than or equal to 90% of their needs Progressing  MONITOR:   PO intake, TF tolerance, Diet advancement  REASON FOR ASSESSMENT:   Consult Assessment of nutrition requirement/status, Calorie Count  ASSESSMENT:   Pt with hx of intermittent n/v for 1 month with recent admission 8/22-10/3 where gastric outlet obstruction was found, biopsy positive for adenocarcinoma and pt underwent Whipple procedure 9/9. Pt's hospital course included TPN and J tube placement 9/9. Pt's diet advanced to soft diet at time of discharge but still receiving 100% of needs via enteral nutrition. Pt admitted to CIR for functional  decline.   MD ordered calorie count, RD monitoring intake. Surgery continues to monitor pt's tube feeding regimen. Pt experienced emesis x 2 in last 24 hr due to nausea, but states it has gotten better with medication.   Pt understands she needs to eat more, but some of the foods pt wants to eat she is unable to have due to being prescribed a soft diet. Pt states she would love more fruit options as well as be able to eat a bagel. Pt feels like she has been receiving the same food which is getting old. Encouraged pt to find ways to increase calorie intake, suggested smoothies, milkshakes, Mighty shakes. Pt agreeable to trying Mighty Shakes on trays. Pt will look into having daughter bring in shakes that pt likes as well, but pt is unsure what outside foods she is allowed to have since she is still on a soft diet. Encouraged pt to talk with surgery team about her diet order and ask for a plan with her tube feeding regimen.   Attempted to reach out to surgery team multiple times but unable to reach team to discuss plan for feeding as pt is nearing discharge this week. Will provide education later this week about high calorie, high protein nutrition therapy but would appreciate collaboration with surgery team so education can be tailored specifically to pt's needs.   Medications: ProSource TF 20 daily Banatrol BID Free water  100 mL TID SSI 0-15 units TID SSI 0-5 units daily Novolog  6 units TID Lantus  20 units daily Creon  TID w/ meals Megace Protonix   Labs: Sodium 131 CBG x 24 hr: 129-341mg /dL J8r 6.1   Diet Order:   Diet Order  DIET SOFT Room service appropriate? Yes; Fluid consistency: Thin  Diet effective now                   EDUCATION NEEDS:   Education needs have been addressed  Skin:  Skin Assessment: Reviewed RN Assessment  Last BM:  10/13 type 6  Height:   Ht Readings from Last 1 Encounters:  08/15/24 5' 8 (1.727 m)    Weight:   Wt Readings  from Last 1 Encounters:  08/25/24 110 kg    Ideal Body Weight:  63.6 kg  BMI:  Body mass index is 36.87 kg/m.  Estimated Nutritional Needs:   Kcal:  2000-2200  Protein:  110-125g  Fluid:  >/= 2L    Josette Glance, MS, RDN, LDN Clinical Dietitian I Please reach out via secure chat or call [phone]

## 2024-08-25 NOTE — Progress Notes (Addendum)
 PROGRESS NOTE   Subjective/Complaints: Pt in gym with therapy. No new complaints. Had questions about surgical plan for JP drain that had cut. Having intermittent nausea. Didn't eat breakfast before working with therapy this am. Has occasional pain in back, abdomen.   ROS: Patient denies fever, rash, sore throat, blurred vision, dizziness, nausea, vomiting, diarrhea, cough, shortness of breath or chest pain, joint or back/neck pain, headache, or mood change.     Objective:   No results found.    Recent Labs    08/25/24 0604  WBC 7.8  HGB 8.4*  HCT 26.4*  PLT 422*    Recent Labs    08/25/24 0604  NA 131*  K 5.0  CL 103  CO2 17*  GLUCOSE 359*  BUN 18  CREATININE 0.99  CALCIUM  8.1*     Intake/Output Summary (Last 24 hours) at 08/25/2024 0933 Last data filed at 08/25/2024 0608 Gross per 24 hour  Intake 780 ml  Output 200 ml  Net 580 ml        Physical Exam: Vital Signs Blood pressure (!) 150/53, pulse 84, temperature 97.8 F (36.6 C), temperature source Oral, resp. rate 18, height 5' 8 (1.727 m), weight 110 kg, SpO2 100%.  Constitutional: No distress . Vital signs reviewed. HEENT: NCAT, EOMI, oral membranes moist Neck: supple Cardiovascular: RRR without murmur. No JVD    Respiratory/Chest: CTA Bilaterally without wheezes or rales. Normal effort    GI/Abdomen: BS +, mild tenderness, RLQ drain with milky drainage, tegaderm over tube. LLQ j tube in place. Abdominal incision with granulation inferiorly.  Ext: no clubbing, cyanosis, or edema Psych: pleasant and cooperative  Skin:  Midline incision cas below Neuro: AAOx4. No apparent cognitive deficits    Sensory exam: revealed normal sensation in all dermatomal regions in bilateral upper extremities and bilateral lower extremities Motor exam: strength 5-/5 throughout bilateral upper extremities and bilateral lower extremities Coordination: Fine motor  coordination was normal.        Assessment/Plan: 1. Functional deficits which require 3+ hours per day of interdisciplinary therapy in a comprehensive inpatient rehab setting. Physiatrist is providing close team supervision and 24 hour management of active medical problems listed below. Physiatrist and rehab team continue to assess barriers to discharge/monitor patient progress toward functional and medical goals  Care Tool:  Bathing    Body parts bathed by patient: Right arm, Left arm, Chest, Abdomen, Front perineal area, Buttocks, Right upper leg, Left upper leg, Face     Body parts n/a: Right lower leg, Left lower leg   Bathing assist Assist Level: Minimal Assistance - Patient > 75%     Upper Body Dressing/Undressing Upper body dressing   What is the patient wearing?: Pull over shirt    Upper body assist Assist Level: Set up assist    Lower Body Dressing/Undressing Lower body dressing      What is the patient wearing?: Pants     Lower body assist Assist for lower body dressing: Moderate Assistance - Patient 50 - 74%     Toileting Toileting    Toileting assist Assist for toileting: Total Assistance - Patient < 25%     Transfers Chair/bed transfer  Transfers assist  Chair/bed transfer activity did not occur: Safety/medical concerns (2/2 high fatigue)  Chair/bed transfer assist level: Supervision/Verbal cueing     Locomotion Ambulation   Ambulation assist   Ambulation activity did not occur: Safety/medical concerns (2/2 high fatigue)  Assist level: Contact Guard/Touching assist Assistive device: Walker-rolling Max distance: 75   Walk 10 feet activity   Assist  Walk 10 feet activity did not occur: Safety/medical concerns (2/2 high fatigue)  Assist level: Contact Guard/Touching assist Assistive device: Walker-rolling   Walk 50 feet activity   Assist Walk 50 feet with 2 turns activity did not occur: Safety/medical concerns (2/2 high  fatigue)  Assist level: Contact Guard/Touching assist Assistive device: Walker-rolling    Walk 150 feet activity   Assist Walk 150 feet activity did not occur: Safety/medical concerns         Walk 10 feet on uneven surface  activity   Assist Walk 10 feet on uneven surfaces activity did not occur: Safety/medical concerns (2/2 high fatigue)         Wheelchair     Assist Is the patient using a wheelchair?: Yes Type of Wheelchair: Manual Wheelchair activity did not occur: Safety/medical concerns (2/2 high fatigue)         Wheelchair 50 feet with 2 turns activity    Assist    Wheelchair 50 feet with 2 turns activity did not occur: Safety/medical concerns (2/2 high fatigue)       Wheelchair 150 feet activity     Assist  Wheelchair 150 feet activity did not occur: Safety/medical concerns (2/2 high fatigue)       Blood pressure (!) 150/53, pulse 84, temperature 97.8 F (36.6 C), temperature source Oral, resp. rate 18, height 5' 8 (1.727 m), weight 110 kg, SpO2 100%.   Medical Problem List and Plan: 1. 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  Antithrombotics: -DVT/anticoagulation:  Pharmaceutical: Lovenox              -antiplatelet therapy: N/A 3. Pain Management: Tylenol  prn.  .    Pain improving --uses tramadol  on occasion 4. Mood/Behavior/Sleep: LCSW to follow for evaluation and support.              -antipsychotic agents: N/A 5. Neuropsych/cognition: This patient is capable of making decisions on her own behalf. 6. Skin/Wound Care: Monitor wound for healing. PICC site looking ok but may d/c in am since no longer receiving TPN or IVF/IV meds  10/13 RUQ JP with leak. Tegaderm per Surgery with suction regained. I see no imminent plans to  change out tubing.    -will need follow up prior to dc Friday   -JP still with milky discharge, 10-15 cc in bulb this am 7. Fluids/Electrolytes/Nutrition: Monitor I/O. Continue tube feeds    Latest Ref Rng & Units 08/25/2024    6:04 AM 08/21/2024    5:06 AM 08/18/2024    4:30 AM  BMP  Glucose 70 - 99 mg/dL 640  709  713   BUN 8 - 23 mg/dL 18  24  22    Creatinine 0.44 - 1.00 mg/dL 9.00  8.81  8.97   Sodium 135 - 145 mmol/L 131  135  137  Potassium 3.5 - 5.1 mmol/L 5.0  4.1  4.0   Chloride 98 - 111 mmol/L 103  105  105   CO2 22 - 32 mmol/L 17  20  20    Calcium  8.9 - 10.3 mg/dL 8.1  8.1  7.9     89/86 Cr improved. Continue to encourage PO 8. Duodenal adenocarcinoma w/gastric outlet obst:  continues on tube feed for nutritional support.  Improved motility passing gas but may have mild ileus per Gen surg, tolerated po vegetables and potatoes yesterday   9. Abdominal distension:  improve motility lass stomach gas, some mild ileus noted per Gen surg  only ~50mg  tramadol  per day     10/13 Large cont type 6 stool today  10. Fluid overload/Pleural effusions: In part due to third spacing. S/p thoracocentesis.  --Weight 241 at admission-->271-->253 today. Continue daily wts.  --Encourage pulmonary hygiene   11. HTN: Monitor BP TID--on amlodipine , catapres  and coreg              - Add hydralazine  25 mg Q8H PRN for HTN >170 SBP or >100 DBP, given recent highs   10/4-10/5 BP running low 140s- con't regimen Vitals:   08/24/24 2011 08/25/24 0341  BP: (!) 137/51 (!) 150/53  Pulse: 78 84  Resp: 18 18  Temp: 98.1 F (36.7 C) 97.8 F (36.6 C)  SpO2: 100% 100%  Controlled 10/13 12.  Prediabetes: Hgb A1c- 6.1. Elevated BS due to tube feeds             --continue Lantus  with meal coverage and SSI             --monitor BS ac/hs and use SSI for elevated BS   CBG (last 3)  Recent Labs    08/24/24 1749 08/24/24 2101 08/25/24 0623  GLUCAP 251* 270* 341*  Increase lantus  10/12 to 20U, minimal  po intake , no change in TF since 10/9 10/13 observe today with changes 13. Acute on chronic kidney disease: SCr 1.3 at admission and now WNL 14.  Obesity  Class 2: BMI 38.47 15.  ABLA: Recheck CBC in am. Hgb 8-9 range.   Stable 10/13    Latest Ref Rng & Units 08/25/2024    6:04 AM 08/21/2024    5:06 AM 08/20/2024    4:26 AM  CBC  WBC 4.0 - 10.5 K/uL 7.8  6.8  6.7   Hemoglobin 12.0 - 15.0 g/dL 8.4  8.4  8.9   Hematocrit 36.0 - 46.0 % 26.4  27.3  29.0   Platelets 150 - 400 K/uL 422  310  329     16. Severe protein malnutrition- J tube placed 9/9 Albumin  of 1.5- is declining-    17. Thrush  10/5- added Nystatin  swish and swallow QID as well as Diflucan  400 mg x1 and 200 mg daily x 6 days- per pt, things taste wrong so I feel this is causing that.  Ask dietary to follow oral vs enteral intake      LOS: 10 days A FACE TO FACE EVALUATION WAS PERFORMED  Lauren Gray 08/25/2024, 9:33 AM

## 2024-08-25 NOTE — Progress Notes (Signed)
 Occupational Therapy Session Note  Patient Details  Name: Lauren Gray MRN: [id number] Date of Birth: 1951-10-18  Today's Date: 08/25/2024 OT Individual Time: 1430-1530 OT Individual Time Calculation (min): 60 min    Short Term Goals: Week 2:  OT Short Term Goal 1 (Week 2): Pt willl be able to stand and manage clothing over hips with min A pre and post toileting. OT Short Term Goal 2 (Week 2): Pt will be able to don pants over feet using reacher with min A. OT Short Term Goal 3 (Week 2): Pt will be able to walk in and out of the bathroom with CGA and no symptoms of shortness of breath to demonstrate improved activity tolerance.  Skilled Therapeutic Interventions/Progress Updates:     Pt stated she was very worn out from earlier AM and then going to CT and having to have 2 new IVs put in.  Pt declined getting out of bed but agreeable to working from bed level.  To address pt's low activity tolerance and decreased arm strength, had pt work on various exercises from bed using a 2 lb dowel bar and yellow light resistance band. Pt completed sh presses, bicep curls,  rows with bar and band around bar,  band attached to different spots on bed rail for single arm sh presses, elb flexion and pulls,  tricep extensions.    Pt discussed her procedures and that she is very confident in her surgical team.   Pt in good spirits this afternoon.   Resting in bed with all needs met. Spouse present.    Therapy Documentation Precautions:  Precautions Precautions: Fall, Other (comment) Precaution/Restrictions Comments: JP drainx2 (L), PEG tube Restrictions Weight Bearing Restrictions Per Provider Order: No Pain: Pain Assessment Pain Scale: 0-10 Pain Score: 0-No pain    Therapy/Group: Individual Therapy  Nautika Cressey 08/25/2024, 12:50 PM

## 2024-08-25 NOTE — Progress Notes (Signed)
   Subjective/Chief Complaint:  One of Lauren Gray drains fell out Friday night/early Saturday. Patient tells me that the drain that fell out was the one that was high output. She denies any new sxs since that time. Ongoing bouts of nausea that improve with medication, not necessarily associated with meals. Having BMs. Tolerating cyclic TF.    Objective: Vital signs in last 24 hours: Temp:  [97.8 F (36.6 C)-98.3 F (36.8 C)] 97.8 F (36.6 C) (10/13 0341) Pulse Rate:  [78-84] 84 (10/13 0341) Resp:  [18-20] 18 (10/13 0341) BP: (137-152)/(51-55) 150/53 (10/13 0341) SpO2:  [100 %] 100 % (10/13 0341) Weight:  [110 kg] 110 kg (10/13 0500) Last BM Date : 08/24/24  Intake/Output from previous day: 10/12 0701 - 10/13 0700 In: 780 [P.O.:480; NG/GT:300] Out: 200 [Emesis/NG output:200] Intake/Output this shift: No intake/output data recorded.  PE:  Constitutional: No acute distress, conversant Eyes: Anicteric sclerae Lungs: normal respiratory effort CV: regular rate and rhythm, no murmurs, no peripheral edema GI: Soft, no masses or hepatosplenomegaly, non-tender to palpation, Dr #2 in place and with milky yellow output - was not on suction but when I placed to suction it held charge. Drain is low output.  Skin: No rashes, palpation reveals normal turgor Psychiatric: appropriate judgment and insight, oriented to person, place, and time   Lab Results:  Recent Labs    08/25/24 0604  WBC 7.8  HGB 8.4*  HCT 26.4*  PLT 422*   BMET Recent Labs    08/25/24 0604  NA 131*  K 5.0  CL 103  CO2 17*  GLUCOSE 359*  BUN 18  CREATININE 0.99  CALCIUM  8.1*  Anti-infectives: Anti-infectives (From admission, onward)    Start     Dose/Rate Route Frequency Ordered Stop   08/18/24 0800  fluconazole  (DIFLUCAN ) tablet 200 mg  Status:  Discontinued        200 mg Oral Daily 08/17/24 0819 08/25/24 0958   08/17/24 1000  fluconazole  (DIFLUCAN ) tablet 400 mg        400 mg Oral  Once 08/17/24 0819  08/17/24 1002       Assessment/Plan: s/p Whipple,  jejunostomy tube Dr. 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  Margins negative.  18 h tube feed cycle for full calorie intake; currently decreased to 14 hours  Calorie counts.  Megace for appetite stimulant.  Decreased tube feeds 10/9 as above. -Dr #2 in place and is low output. KUB yesterday confirmed no residual tubing in Lauren Gray abdomen from the drain that fell out. I am concerned about uncontrolled pancreatic leak due to low output from remaining drain. She has no evidence of sepsis, no status change as a result of this. Will get CT to assess, may need IR to insert a new drain.     Dispo - per PM&R.   LOS: 10 days    Lauren Gray 08/25/2024

## 2024-08-26 ENCOUNTER — Inpatient Hospital Stay (HOSPITAL_COMMUNITY)

## 2024-08-26 LAB — GLUCOSE, CAPILLARY
Glucose-Capillary: 323 mg/dL — ABNORMAL HIGH (ref 70–99)
Glucose-Capillary: 112 mg/dL — ABNORMAL HIGH (ref 70–99)
Glucose-Capillary: 94 mg/dL (ref 70–99)
Glucose-Capillary: 145 mg/dL — ABNORMAL HIGH (ref 70–99)

## 2024-08-26 LAB — BASIC METABOLIC PANEL WITH GFR
Anion gap: 12 (ref 5–15)
BUN: 20 mg/dL (ref 8–23)
CO2: 18 mmol/L — ABNORMAL LOW (ref 22–32)
Calcium: 8.2 mg/dL — ABNORMAL LOW (ref 8.9–10.3)
Chloride: 103 mmol/L (ref 98–111)
Creatinine, Ser: 1.02 mg/dL — ABNORMAL HIGH (ref 0.44–1.00)
GFR, Estimated: 58 mL/min — ABNORMAL LOW (ref >60)
Glucose, Bld: 143 mg/dL — ABNORMAL HIGH (ref 70–99)
Potassium: 4.8 mmol/L (ref 3.5–5.1)
Sodium: 133 mmol/L — ABNORMAL LOW (ref 135–145)

## 2024-08-26 LAB — PROTIME-INR
INR: 1.2 (ref 0.8–1.2)
Prothrombin Time: 16.2 s — ABNORMAL HIGH (ref 11.4–15.2)

## 2024-08-26 MED ORDER — FENTANYL CITRATE (PF) 100 MCG/2ML IJ SOLN
INTRAMUSCULAR | Status: AC | PRN
  Administered 2024-08-26 (×2): 50 ug via INTRAVENOUS

## 2024-08-26 MED ORDER — LIDOCAINE HCL 1 % IJ SOLN
INTRAMUSCULAR | Status: AC
  Filled 2024-08-26: qty 10

## 2024-08-26 MED ORDER — MIDAZOLAM HCL 2 MG/2ML IJ SOLN
INTRAMUSCULAR | Status: AC
Start: 2024-08-26 — End: 2024-08-26
  Filled 2024-08-26: qty 2

## 2024-08-26 MED ORDER — SODIUM CHLORIDE 0.9% FLUSH
5.0000 mL | Freq: Three times a day (TID) | INTRAVENOUS | Status: DC
  Administered 2024-08-26 – 2024-09-04 (×26): 5 mL

## 2024-08-26 MED ORDER — LIDOCAINE 1 % OPTIME INJ - NO CHARGE
10.0000 mL | Freq: Once | INTRAMUSCULAR | Status: AC
  Administered 2024-08-26: 10 mL via INTRADERMAL

## 2024-08-26 MED ORDER — FENTANYL CITRATE (PF) 100 MCG/2ML IJ SOLN
INTRAMUSCULAR | Status: AC
  Filled 2024-08-26: qty 2

## 2024-08-26 MED ORDER — INSULIN GLARGINE 100 UNIT/ML ~~LOC~~ SOLN
22.0000 [IU] | Freq: Two times a day (BID) | SUBCUTANEOUS | Status: DC
  Administered 2024-08-26 – 2024-08-27 (×2): 22 [IU] via SUBCUTANEOUS
  Filled 2024-08-26 (×3): qty 0.22

## 2024-08-26 MED ORDER — LIDOCAINE HCL (PF) 1 % IJ SOLN
10.0000 mL | Freq: Once | INTRAMUSCULAR | Status: AC
  Administered 2024-08-26: 10 mL

## 2024-08-26 MED ORDER — INSULIN GLARGINE 100 UNIT/ML ~~LOC~~ SOLN
4.0000 [IU] | Freq: Once | SUBCUTANEOUS | Status: AC
  Administered 2024-08-26: 4 [IU] via SUBCUTANEOUS
  Filled 2024-08-26: qty 0.04

## 2024-08-26 MED ORDER — MIDAZOLAM HCL 2 MG/2ML IJ SOLN
INTRAMUSCULAR | Status: AC | PRN
  Administered 2024-08-26: 1 mg via INTRAVENOUS

## 2024-08-26 NOTE — Consult Note (Signed)
 Chief Complaint:  Intra-abdominal abscess  Procedure: Percutaneous Drain placement  Referring Provider(s): Dr. Jina Nephew  Supervising Physician: Hughes Simmonds  Patient Status: [facility] - In-pt  History of Present Illness: Lauren Gray is a 73 y.o. female who presented to the hospital on 8/22 with concerns for abdominal pain, vomiting, and weight loss for the past several weeks. 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 She has been recovering well and was discharged to rehab on 10/3. Unfortunately over the weekend one of her surgical drains became dislodged. She has since had worsening nausea without vomiting and mild abdominal pain. Repeat CT A/P from 10/13 revealed multiple intraabdominal abscesses. IR consulted for possible drain placement. Case and images reviewed and approved by Dr. JINNY Hughes.   Patient is currently sitting in her wheelchair in the therapy room. States that she continues to feel intermittently nauseous with some mild abdominal pain. Denies any fevers/chills, changes in bowel movements, chest pain, or shortness of breath. NPO since ~8am this morning when she received a tube feed. All questions and concerns answered at the bedside.   Patient is Full Code  Past Medical History:  Diagnosis Date   Complication of anesthesia    Obesity    Scoliosis     Past Surgical History:  Procedure Laterality Date   ABDOMINAL HYSTERECTOMY     TVH   BONE BIOPSY  07/06/2024   Procedure: BIOPSY, GI;  Surgeon: Elicia Claw, MD;  Location: [facility];  Service: Gastroenterology;;   ENTEROSCOPY N/A 07/06/2024   Procedure: ENTEROSCOPY;  Surgeon: Elicia Claw, MD;  Location: [facility];  Service: Gastroenterology;  Laterality: N/A;   JEJUNOSTOMY N/A 07/22/2024    Procedure: CREATION, JEJUNOSTOMY;  Surgeon: Nephew Jina, MD;  Location: [facility] OR;  Service: General;  Laterality: N/A;   PELVIC LAPAROSCOPY  04/06/2006   BSO   WHIPPLE PROCEDURE N/A 07/22/2024   Procedure: WHIPPLE PROCEDURE;  Surgeon: Nephew Jina, MD;  Location: [facility] OR;  Service: General;  Laterality: N/A;    Allergies: Codeine and Latex  Medications: Prior to Admission medications   Medication Sig Start Date End Date Taking? Authorizing Provider  amLODipine  (NORVASC ) 10 MG tablet Take 1 tablet (10 mg total) by mouth daily. 08/16/24   Danford, Lonni SHAUNNA, MD  carvedilol (COREG) 6.25 MG tablet Take 2 tablets (12.5 mg total) by mouth 2 (two) times daily with a meal. 08/15/24   Danford, Lonni SHAUNNA, MD  fiber supplement, BANATROL TF, liquid Place 60 mLs into feeding tube 2 (two) times daily. 08/15/24   Danford, Lonni SHAUNNA, MD  gabapentin  (NEURONTIN ) 250 MG/5ML solution Place 2 mLs (100 mg total) into feeding tube every 8 (eight) hours. 08/15/24   Danford, Lonni SHAUNNA, MD  HYDROcodone-acetaminophen  (NORCO/VICODIN) 5-325 MG tablet Take 1-2 tablets by mouth every 6 (six) hours as needed for severe pain (pain score 7-10). 08/15/24   Danford, Lonni SHAUNNA, MD  insulin  aspart (NOVOLOG ) 100 UNIT/ML injection Inject 0-15 Units into the skin 3 (three) times daily with meals. 08/15/24   Danford, Lonni SHAUNNA, MD  insulin  glargine (LANTUS ) 100 UNIT/ML injection Inject 0.2 mLs (20 Units total) into the skin daily. 08/15/24   Danford, Lonni SHAUNNA, MD  lipase/protease/amylase 24000-76000 units CPEP Take 1 capsule (24,000 Units total) by mouth 3 (three) times daily before meals. 08/15/24   Danford, Lonni SHAUNNA,  MD  methocarbamol  (ROBAXIN ) 1000 MG/10ML injection Inject 5 mLs (500 mg total) into the vein every 8 (eight) hours. 08/15/24   Danford, Lonni SQUIBB, MD  Morphine  Sulfate (MORPHINE , PF,) 2 MG/ML injection Inject 1 mL (2 mg total) into the vein every 2 (two) hours as needed (Intractable pain not  controlled by oral medication). 08/15/24   Danford, Lonni SQUIBB, MD  Nutritional Supplements (FEEDING SUPPLEMENT, OSMOLITE 1.5 CAL,) LIQD Place 1,260 mLs into feeding tube daily. 08/16/24   Danford, Lonni SQUIBB, MD  ondansetron  (ZOFRAN ) 4 MG/2ML SOLN injection Inject 2 mLs (4 mg total) into the vein every 4 (four) hours as needed for nausea or vomiting. 08/15/24   Danford, Lonni SQUIBB, MD  pantoprazole  (PROTONIX ) 40 MG tablet Take 1 tablet (40 mg total) by mouth daily. 08/15/24   Danford, Lonni SQUIBB, MD  Protein (FEEDING SUPPLEMENT, PROSOURCE TF20,) liquid Place 60 mLs into feeding tube daily. 08/16/24   Danford, Lonni SQUIBB, MD  sodium chloride  0.9 % SOLN 50 mL with promethazine  25 MG/ML SOLN 12.5 mg Inject 12.5 mg into the vein every 6 (six) hours as needed. 08/15/24   Danford, Lonni SQUIBB, MD  traMADol  (ULTRAM ) 50 MG tablet Place 1 tablet (50 mg total) into feeding tube every 12 (twelve) hours as needed for moderate pain (pain score 4-6). 08/15/24   Danford, Lonni SQUIBB, MD  traZODone  (DESYREL ) 50 MG tablet Place 1 tablet (50 mg total) into feeding tube at bedtime. 08/15/24   Danford, Lonni SQUIBB, MD  Water  For Irrigation, Sterile (FREE WATER ) SOLN Place 100 mLs into feeding tube every 8 (eight) hours. 08/15/24   Danford, Lonni SQUIBB, MD     Family History  Problem Relation Age of Onset   Cancer Mother        ENDOMETRIOD OF OVARY   Diabetes Father    Heart disease Father    Breast cancer Sister 69   Aneurysm Sister    Diabetes Paternal Grandmother     Social History   Socioeconomic History   Marital status: Married    Spouse name: Not on file   Number of children: 1   Years of education: Not on file   Highest education level: Not on file  Occupational History   Not on file  Tobacco Use   Smoking status: Never   Smokeless tobacco: Never  Vaping Use   Vaping status: Never Used  Substance and Sexual Activity   Alcohol use: Not Currently   Drug use: No   Sexual  activity: Yes    Partners: Male    Birth control/protection: Surgical    Comment: HYSTERECTOMY  Other Topics Concern   Not on file  Social History Narrative   Lives with her husband    Social Drivers of Corporate investment banker Strain: Not on file  Food Insecurity: No Food Insecurity (07/20/2024)   Hunger Vital Sign    Worried About Running Out of Food in the Last Year: Never true    Ran Out of Food in the Last Year: Never true  Transportation Needs: No Transportation Needs (07/20/2024)   PRAPARE - Administrator, Civil Service (Medical): No    Lack of Transportation (Non-Medical): No  Physical Activity: Not on file  Stress: Not on file  Social Connections: Socially Integrated (07/20/2024)   Social Connection and Isolation Panel    Frequency of Communication with Friends and Family: Three times a week    Frequency of Social Gatherings with Friends and Family: Twice a  week    Attends Religious Services: 1 to 4 times per year    Active Member of Clubs or Organizations: Yes    Attends Banker Meetings: 1 to 4 times per year    Marital Status: Married    Review of Systems  Gastrointestinal:  Positive for abdominal pain (mild) and nausea (intermittent). Negative for vomiting.  Patient denies any headache, chest pain, shortness of breath, vomiting or fever/chills. All other systems are negative.   Vital Signs: BP (!) 139/56 (BP Location: Right Leg)   Pulse 86   Temp 99.1 F (37.3 C) (Oral)   Resp 18   Ht 5' 8 (1.727 m)   Wt 242 lb 8.1 oz (110 kg)   SpO2 99%   BMI 36.87 kg/m    Physical Exam Constitutional:      Appearance: Normal appearance.  HENT:     Mouth/Throat:     Mouth: Mucous membranes are moist.     Pharynx: Oropharynx is clear.  Cardiovascular:     Rate and Rhythm: Normal rate.     Pulses: Normal pulses.  Pulmonary:     Effort: Pulmonary effort is normal.  Abdominal:     General: Abdomen is flat.     Palpations: Abdomen is soft.      Comments: Gastrostomy tube and surgical drain in place w/ small amount of output  Skin:    General: Skin is warm and dry.  Neurological:     Mental Status: She is alert and oriented to person, place, and time.  Psychiatric:        Behavior: Behavior normal.     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 Abdominal pain. * Tracking Code: BO * EXAM: CT ABDOMEN AND PELVIS WITH CONTRAST TECHNIQUE: Multidetector CT imaging of the abdomen and pelvis was performed using the standard protocol following bolus administration of intravenous contrast. RADIATION DOSE REDUCTION: This exam was performed according to the departmental dose-optimization program which includes automated exposure control, adjustment of the mA and/or kV according to patient size and/or use of iterative reconstruction technique. CONTRAST:  OMNIPAQUE  IOHEXOL  350 MG/ML SOLN COMPARISON:  07/30/2024 FINDINGS: Lower chest: Previous right pleural effusion has resolved. Small to moderate left pleural effusion persists with associated passive atelectasis. Mild cardiomegaly. Hepatobiliary: Fluid density lesion of the lateral segment left hepatic lobe on image 24 series 3 measuring 0.1 by 0.9 cm, favoring benign cyst. Pancreas: Highly indistinct porta hepatis and pancreaticoduodenal region in the vicinity of the Whipple procedure. Pancreaticojejunostomy and presumed choledocho jejunostomy poorly seen but no obvious dorsal pancreatic duct dilatation or intrahepatic biliary dilatation noted. Moderate peripancreatic stranding may reflect ongoing pancreatitis. Curving 12.1 by 4.1 by 2.4 cm (volume = 62 cm^3) abscess extends above the Whipple site nearly to the hiatus, and tracks around the anterior margin of the pancreatic  body to extend down anterior to the lower portion of the descending duodenum. This contains gas and fluid. A separate abscess tracks from the pancreatic tail down along the left anterior perirenal fascia and measures 11.5 by 3.4 by 3.0 cm (volume = 61 cm^3), with internal fluid and a small amount of gas along with thick enhancing margins. There is potentially a third small abscesses along the anterior gastrohepatic ligament region measuring 2.0 by 1.6 by 2.5 cm (volume = 4.2 cm^3) on  image 26 series 3 A left-sided jejunostomy catheter is noted. A right upper quadrant drain extends just above the transverse colon but is not traversing the described abscesses. Spleen: Small amount of perisplenic ascites. Adrenals/Urinary Tract: Small left peripelvic renal cysts. Along the right kidney lower pole, a 3.7 by 1.9 by 2.0 cm (volume = 7.4 cm^3) collection with enhancing margins is noted and appears new compared to 07/30/2024, suspicious for a right perirenal abscess. Stomach/Bowel: Wall thickening in the stomach including in the vicinity of the presumed gastrojejunostomy. No dilated bowel. Vascular/Lymphatic: Notable central mesenteric adenopathy. Some of this may well be reactive. Index mesenteric node 2.1 cm in short axis on image 44 series 3, formerly 2.1 cm. Reproductive: Uterus absent. Other: Small but abnormal amount of free pelvic fluid in the cul-de-sac. Mild thickening along the anterior peritoneal reflection especially on the right side as on image 38 series 3, this may be inflammatory and is less likely to represent tumor deposition. Small amount of fluid in the right paracolic gutter and tracking down in the right retroperitoneum. Mild mesenteric and omental edema. Musculoskeletal: Endplate sclerosis with nitrogen gas phenomenon endplate irregularity at L3-4, similar back through 07/04/2024, degenerative findings favored over discitis-osteomyelitis. Lumbar spondylosis and degenerative disc disease causing  substantial impingement at the L3-4 and L4-5 levels. IMPRESSION: 1. Multiple abscesses in the upper abdomen, including a 62 cc abscess extending above the Whipple site nearly to the hiatus, and a separate 61 cc abscess tracking from the pancreatic tail down along the left anterior perirenal fascia. There is potentially a third small abscess along the anterior gastrohepatic ligament region. 2. 7.4 cc right lower pole perirenal abscess. 3. Small to moderate left pleural effusion with associated passive atelectasis. 4. Mild cardiomegaly. 5. Notable central mesenteric adenopathy, some of this may well be reactive. 6. Small but abnormal amount of free pelvic fluid in the cul-de-sac. 7. Mild thickening along the anterior peritoneal reflection especially on the right side, this may be inflammatory and is less likely to represent tumor deposition. 8. Lumbar spondylosis and degenerative disc disease causing substantial impingement at the L3-4 and L4-5 levels. 9. Endplate sclerosis with nitrogen gas phenomenon endplate irregularity at L3-4, similar back through 07/04/2024, degenerative findings favored over discitis-osteomyelitis. Electronically Signed   By: Ryan Salvage M.D.   On: 08/25/2024 18:54   DG Abd Portable 1V Result Date: 08/23/2024 CLINICAL DATA:  Surgical dehiscence EXAM: DG ABD PORTABLE 1V COMPARISON:  Abdominal radiograph dated 08/19/2024 FINDINGS: Similar position of left lower quadrant catheter. Interval removal of 1 of the right lateral approach catheters with remaining catheter extending to the left hemiabdomen. A few dilated loops of left upper quadrant bowel. No free air or pneumatosis. No abnormal radio-opaque calculi or mass effect. No acute or substantial osseous abnormality. The sacrum and coccyx are partially obscured by overlying bowel contents. IMPRESSION: 1. Interval removal of 1 of the right lateral approach catheters with remaining catheter extending to the left hemiabdomen. Similar  position of left lower quadrant catheter. 2. A few dilated loops of left upper quadrant bowel, likely ileus. Electronically Signed   By: Limin  Xu M.D.   On: 08/23/2024 11:23   DG Abd 1 View Result Date: 08/19/2024 CLINICAL DATA:  Epigastric abdominal discomfort.  Postop Whipple. EXAM: ABDOMEN - 1 VIEW COMPARISON:  Radiograph 08/15/2024 FINDINGS: Multiple drains project over the right and left abdomen. Scattered air within prominent small bowel, measuring up to 3.2 cm in the left abdomen. Minimal formed stool in the colon. Surgical clips  in the upper abdomen. IMPRESSION: Scattered air within prominent small bowel, measuring up to 3.2 cm in the left abdomen, favor postoperative ileus. Multiple drains remain in place. Electronically Signed   By: Andrea Gasman M.D.   On: 08/19/2024 14:58   DG Abd 1 View Result Date: 08/15/2024 CLINICAL DATA:  10323 Abdominal distension 10323 EXAM: ABDOMEN - 1 VIEW COMPARISON:  07/27/2024, 07/30/2024 FINDINGS: Interval removal of the esophagogastric tube. Nonobstructive bowel gas pattern.Postsurgical drains in the epigastrium and left mid abdomen.No pneumoperitoneum. No organomegaly or radiopaque calculi. No acute fracture or destructive lesion. Multilevel degenerative disc disease of the spine. IMPRESSION: Nonobstructive bowel gas pattern. Electronically Signed   By: Rogelia Myers M.D.   On: 08/15/2024 21:14   US  THORACENTESIS ASP PLEURAL SPACE W/IMG GUIDE Result Date: 08/02/2024 INDICATION: 73 year old female with right pleural effusion. Request for diagnostic thoracentesis. EXAM: ULTRASOUND GUIDED RIGHT THORACENTESIS MEDICATIONS: 10 mL 1% lidocaine  COMPLICATIONS: None immediate. PROCEDURE: An ultrasound guided thoracentesis was thoroughly discussed with the patient and questions answered. The benefits, risks, alternatives and complications were also discussed. The patient understands and wishes to proceed with the procedure. Written consent was obtained. Ultrasound was  performed to localize and mark an adequate pocket of fluid in the right chest. The area was then prepped and draped in the normal sterile fashion. 1% Lidocaine  was used for local anesthesia. Under ultrasound guidance a 6 Fr Safe-T-Centesis catheter was introduced. Thoracentesis was performed. The catheter was removed and a dressing applied. FINDINGS: A total of approximately 250 mL of amber fluid was removed. Samples were sent to the laboratory as requested by the clinical team. IMPRESSION: Successful ultrasound guided right thoracentesis yielding 250 mL of pleural fluid. Performed by: Kacie Matthews PA-C Electronically Signed   By: Cordella Banner   On: 08/02/2024 15:28   DG Chest 1 View Result Date: 08/02/2024 CLINICAL DATA:  Pleural effusion.  Status post thoracentesis. EXAM: CHEST  1 VIEW COMPARISON:  08/01/2024 FINDINGS: Low lung volumes. The cardio pericardial silhouette is enlarged. Similar left base collapse/consolidation with small bilateral pleural effusions evident. No pneumothorax. Right PICC line tip overlies the mid SVC level. Telemetry leads overlie the chest. IMPRESSION: Low volume film with left base collapse/consolidation and small bilateral pleural effusions. No pneumothorax. Electronically Signed   By: Camellia Candle M.D.   On: 08/02/2024 11:44   DG CHEST PORT 1 VIEW Result Date: 08/01/2024 EXAM: 1 VIEW XRAY OF THE CHEST 08/01/2024 05:48:33 AM COMPARISON: 07/30/2024 radiograph and CT CLINICAL HISTORY: Pneumonia FINDINGS: LINES, TUBES AND DEVICES: Stable right PICC line. Enteric tube removed. LUNGS AND PLEURA: Low lung volumes. Increased left basilar opacity. Unchanged right mid lung opacity. Decreased right pleural effusion. Left pleural effusion on CT is not well demonstrated by radiograph. HEART AND MEDIASTINUM: Unchanged heart size and mediastinal contours. BONES AND SOFT TISSUES: No acute osseous abnormality. Midline abdominal skin staples. IMPRESSION: 1. Increased left basilar opacity.  Left pleural effusion on CT is not well demonstrated by radiograph. 2. Unchanged right mid lung opacity. Decreased right pleural effusion. Electronically signed by: Andrea Gasman MD 08/01/2024 01:24 PM EDT RP Workstation: [id number]   CT CHEST ABDOMEN PELVIS W CONTRAST Result Date: 07/30/2024 CLINICAL DATA:  Sepsis. EXAM: CT CHEST, ABDOMEN, AND PELVIS WITH CONTRAST TECHNIQUE: Multidetector CT imaging of the chest, abdomen and pelvis was performed following the standard protocol during bolus administration of intravenous contrast. RADIATION DOSE REDUCTION: This exam was performed according to the departmental dose-optimization program which includes automated exposure control, adjustment of the mA  and/or kV according to patient size and/or use of iterative reconstruction technique. CONTRAST:  75mL OMNIPAQUE  IOHEXOL  350 MG/ML SOLN COMPARISON:  July 04, 2024. FINDINGS: CT CHEST FINDINGS Cardiovascular: No significant vascular findings. Normal heart size. No pericardial effusion. Mediastinum/Nodes: No enlarged mediastinal, hilar, or axillary lymph nodes. Thyroid gland, trachea, and esophagus demonstrate no significant findings. Lungs/Pleura: Small to moderate size bilateral pleural effusions are noted with adjacent subsegmental atelectasis. No pneumothorax is noted. Patchy airspace opacities are noted anteriorly in left upper lobe concerning for multifocal pneumonia. Musculoskeletal: No chest wall mass or suspicious bone lesions identified. CT ABDOMEN PELVIS FINDINGS Hepatobiliary: Gallbladder is not visualized and presumably has been removed. No abnormal bowel dilatation. Left hepatic cyst is again noted and unchanged. Pancreas: Large amount of inflammatory stranding and fluid is seen in the region of the pancreatic head and body consistent with history of recent Whipple's procedure. Pancreatic tail is unremarkable. Two surgical drains are seen entering right upper quadrant with tips in or near surgical bed.  Spleen: Normal in size without focal abnormality. Adrenals/Urinary Tract: Adrenal glands and kidneys appear normal. No hydronephrosis or renal obstruction is noted. Urinary bladder is decompressed. Stomach/Bowel: Nasogastric tube tip is seen within the gastric lumen. There is no evidence of bowel obstruction or inflammation. The appendix is not visualized. Jejunostomy tube is seen entering left lower quadrant. Vascular/Lymphatic: Aortic atherosclerosis. No enlarged abdominal or pelvic lymph nodes. Reproductive: Status post hysterectomy. No adnexal masses. Other: No ascites or hernia is noted. Musculoskeletal: No acute or significant osseous findings. IMPRESSION: 1. Small to moderate size bilateral pleural effusions are noted with adjacent subsegmental atelectasis. 2. Patchy airspace opacities are noted anteriorly in left upper lobe concerning for multifocal pneumonia. 3. Large amount of inflammatory stranding and fluid is seen in the region of the pancreatic head and body consistent with history of recent Whipple's procedure. Two surgical drains are seen entering right upper quadrant with tips in or near surgical bed. 4. Aortic atherosclerosis. Aortic Atherosclerosis (ICD10-I70.0). Electronically Signed   By: Lynwood Landy Raddle M.D.   On: 07/30/2024 12:46   DG CHEST PORT 1 VIEW Result Date: 07/30/2024 EXAM: 1 VIEW XRAY OF THE CHEST 07/30/2024 11:05:00 AM COMPARISON: 07/27/2024 CLINICAL HISTORY: Hypoxia 200808. Reason for exam: hypoxia FINDINGS: LINES, TUBES AND DEVICES: Enteric tube in place with tip and side port overlying the stomach. Right PICC in place with tip overlying the expected region of the distal superior vena cava. LUNGS AND PLEURA: Low lung volumes. Bibasilar atelectasis. Mild diffuse interstitial prominence. Slightly increased right mid and lower lung opacities. HEART AND MEDIASTINUM: No acute abnormality of the cardiac and mediastinal silhouettes. BONES AND SOFT TISSUES: No acute osseous abnormality.  IMPRESSION: 1. Low lung volumes with bibasilar atelectasis and mild diffuse interstitial prominence. 2. Slightly increased right mid and lower lung opacities. Electronically signed by: Donnice Mania MD 07/30/2024 12:42 PM EDT RP Workstation: [id number]   DG CHEST PORT 1 VIEW Result Date: 07/27/2024 CLINICAL DATA:  Vomiting EXAM: PORTABLE CHEST 1 VIEW COMPARISON:  07/24/2024 FINDINGS: Right PICC line tip: SVC.  Nasogastric tube tip in the stomach body. Low lung volumes are present, causing crowding of the pulmonary vasculature. Bandlike density at the right lung base potentially from atelectasis or scarring. Central retrocardiac density compatible with small hiatal hernia. No blunting of the costophrenic angles. Heart size within normal limits. No significant skeletal abnormality observed. IMPRESSION: 1. Low lung volumes are present, causing crowding of the pulmonary vasculature. 2. Bandlike density at the right lung base potentially from  atelectasis or scarring. 3. Small hiatal hernia. 4. Nasogastric tube tip in the stomach body. Electronically Signed   By: Ryan Salvage M.D.   On: 07/27/2024 17:43   DG Abd 1 View Result Date: 07/27/2024 CLINICAL DATA:  Nasogastric tube placement EXAM: ABDOMEN - 1 VIEW COMPARISON:  07/10/2024 FINDINGS: Nasogastric tube tip and side port are in the stomach body. Midline skin staples in the upper abdomen. Contrast medium noted in the colon. IMPRESSION: 1. Nasogastric tube tip and side port are in the stomach body. Electronically Signed   By: Ryan Salvage M.D.   On: 07/27/2024 17:41    Labs:  CBC: Recent Labs    08/18/24 0430 08/20/24 0426 08/21/24 0506 08/25/24 0604  WBC 6.1 6.7 6.8 7.8  HGB 8.4* 8.9* 8.4* 8.4*  HCT 26.9* 29.0* 27.3* 26.4*  PLT 301 329 310 422*    COAGS: Recent Labs    07/22/24 0350 07/23/24 0406 07/31/24 1137  INR 1.0 1.2 1.2    BMP: Recent Labs    08/17/24 0522 08/18/24 0430 08/21/24 0506 08/25/24 0604  NA 138 137 135  131*  K 4.2 4.0 4.1 5.0  CL 106 105 105 103  CO2 21* 20* 20* 17*  GLUCOSE 287* 286* 290* 359*  BUN 23 22 24* 18  CALCIUM  8.2* 7.9* 8.1* 8.1*  CREATININE 1.07* 1.02* 1.18* 0.99  GFRNONAA 55* 58* 49* >60    LIVER FUNCTION TESTS: Recent Labs    08/10/24 1321 08/12/24 0052 08/14/24 0347 08/16/24 0338  BILITOT 0.6 0.6 0.7 0.5  AST 16 13* 18 13*  ALT 25 19 21 17   ALKPHOS 100 83 117 98  PROT 7.2 6.3* 6.9 6.7  ALBUMIN  1.9* 1.6* 1.7* 1.5*    TUMOR MARKERS: No results for input(s): AFPTM, CEA, CA199, CHROMGRNA in the last 8760 hours.  Assessment and Plan:  Intra-abdominal fluid collections: LAKASHA MCFALL is a 73 y.o. 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 Aron on 07/22/24. Patient had 2 surgical drains in place following the procedure until this past weekend when one of the drains became dislodged. Repeat CT scan completed which showed multiple intra-abdominal fluid collections concerning for abscesses. IR consulted for possible drain placement which was approved by Dr. JINNY Hall.  Procedure to be performed under moderate sedation.  -NPO since ~8am; procedure to be done after 2pm should schedule allow -PT reports patient's therapy session ends ~2:45pm, but they are willing to stop early if needed -INR pending -WBC 7.8 on 10/13; VSS stable, afebrile  Risks and benefits discussed with the patient including bleeding, infection, damage to adjacent structures, bowel perforation/fistula connection, and sepsis.  All of the patient's questions were answered, patient is agreeable to proceed. Consent signed and in chart.   Thank you for allowing our service to participate in Lauren Gray 's care.    Electronically Signed: Glennon CHRISTELLA Bal, PA-C   08/26/2024, 10:42 AM     I spent a total of 40 Minutes in face to face in clinical consultation, greater than 50% of which  was counseling/coordinating care for abscess drain placement.

## 2024-08-26 NOTE — Plan of Care (Signed)
  Problem: RH Bathing Goal: LTG Patient will bathe all body parts with assist levels (OT) Description: LTG: Patient will bathe all body parts with assist levels (OT) Flowsheets (Taken 08/26/2024 1216) LTG: Pt will perform bathing with assistance level/cueing: (LTG downgraded due to limited activity tolerance and reach due discomfort in abdomen with twisting.) Supervision/Verbal cueing Note: LTG downgraded due to limited activity tolerance and reach due discomfort in abdomen with twisting.    Problem: RH Dressing Goal: LTG Patient will perform lower body dressing w/assist (OT) Description: LTG: Patient will perform lower body dressing with assist, with/without cues in positioning using equipment (OT) Flowsheets (Taken 08/26/2024 1216) LTG: Pt will perform lower body dressing with assistance level of: (LTG downgraded due to limited activity tolerance and reach due discomfort in abdomen with twisting.) Supervision/Verbal cueing Note: LTG downgraded due to limited activity tolerance and reach due discomfort in abdomen with twisting.    Problem: RH Toileting Goal: LTG Patient will perform toileting task (3/3 steps) with assistance level (OT) Description: LTG: Patient will perform toileting task (3/3 steps) with assistance level (OT)  Flowsheets (Taken 08/26/2024 1216) LTG: Pt will perform toileting task (3/3 steps) with assistance level: (LTG downgraded due to limited activity tolerance and reach due discomfort in abdomen with twisting.) Supervision/Verbal cueing Note: LTG downgraded due to limited activity tolerance and reach due discomfort in abdomen with twisting.    Problem: RH Light Housekeeping Goal: LTG Patient will perform light housekeeping w/assist (OT) Description: LTG: Patient will perform light housekeeping with assistance, with/without cues (OT). Flowsheets (Taken 08/26/2024 1216) LTG: Pt will perform light housekeeping with assistance level of: (LTG discontinued as pt's family will  assist with housekeeping tasks.) -- Note: LTG discontinued as pt's family will assist with housekeeping tasks.    Problem: RH Tub/Shower Transfers Goal: LTG Patient will perform tub/shower transfers w/assist (OT) Description: LTG: Patient will perform tub/shower transfers with assist, with/without cues using equipment (OT) Flowsheets (Taken 08/26/2024 1216) LTG: Pt will perform tub/shower stall transfers with assistance level of: (LTG discontinued as pt is not able to shower at this time.) -- Note: LTG discontinued as pt is not able to shower at this time.

## 2024-08-26 NOTE — Progress Notes (Signed)
 Occupational Therapy Session Note  Patient Details  Name: Lauren Gray MRN: [id number] Date of Birth: 1950/11/16  Today's Date: 08/26/2024 OT Individual Time: 9169-9084 OT Individual Time Calculation (min): 45 min    Short Term Goals: Week 2:  OT Short Term Goal 1 (Week 2): Pt willl be able to stand and manage clothing over hips with min A pre and post toileting. OT Short Term Goal 2 (Week 2): Pt will be able to don pants over feet using reacher with min A. OT Short Term Goal 3 (Week 2): Pt will be able to walk in and out of the bathroom with CGA and no symptoms of shortness of breath to demonstrate improved activity tolerance.  Skilled Therapeutic Interventions/Progress Updates:  Pt received sitting EOB about to get dressed.  Donned shirt and then started to try to don pants but had difficulty over feet.  Agreeable to trying a reacher. She was able to don pants over feet with mod cues on how to use the reacher and then she pulled them up to thighs, stood with S and pulled 80% of the way over her hips.  Needs A to pullup over the very back.   Pt did quite well with sit to stands with S and wanted to walk to recliner without RW.  Close S/light CGA without it.    Pt having increased anxiety due to uncertainty of situation she is in.  Pt continues to mouth breathe with chest vs belly breathing.     Had pt work on belly breathing, inhaling through nose and out with mouth.  Did a visualization meditation with pt to help her relax.    Pt resting in recliner with all needs met.      Therapy Documentation Precautions:  Precautions Precautions: Fall, Other (comment) Precaution/Restrictions Comments: JP drainx2 (L), PEG tube Restrictions Weight Bearing Restrictions Per Provider Order: No    Vital Signs: Therapy Vitals Temp: 99.1 F (37.3 C) Temp Source: Oral Pulse Rate: 86 Resp: 18 BP: (!) 139/56 Patient Position (if appropriate): Lying Oxygen Therapy SpO2: 99 % O2 Device:  Room Air Pain:  No c/o pain during OT session.    Therapy/Group: Individual Therapy  Francheska Villeda 08/26/2024, 10:00 AM

## 2024-08-26 NOTE — Procedures (Signed)
 Vascular and Interventional Radiology Procedure Note  Patient: Lauren Gray DOB: 03/25/1951 Medical Record Number: [id number] Note Date/Time: 08/26/24 2:58 PM   Performing Physician: Thom Hall, MD Assistant(s): None  Diagnosis: s/p whipple. Intra-abdominal abscess  Procedure: DRAINAGE CATHETER PLACEMENT OF INTRA-ABDOMINAL ABSCESS  Anesthesia: Conscious Sedation Complications: None Estimated Blood Loss: Minimal Specimens: Sent for Gram Stain, Aerobe Culture, and Anerobe Culture  Findings:  Successful CT-guided placement of 12 F catheter into RUQ abscess.  Plan:  - Flush drain with 5 mL Normal Saline every 8 hours. - Follow up drain evaluation / sinogram in 10 day(s).  See detailed procedure note with images in PACS. The patient tolerated the procedure well without incident or complication and was returned to Recovery in stable condition.    Thom Hall, MD [facility]   Pager. [phone] Clinic. [phone]

## 2024-08-26 NOTE — Progress Notes (Signed)
 Patient complained of chest pain across her chest, when assessed vitals stable, PA notified no new order. Patient currently sleeping. We will continue to monitor.

## 2024-08-26 NOTE — Progress Notes (Signed)
 Physical Therapy Session Note  Patient Details  Name: Lauren Gray MRN: [id number] Date of Birth: 10-Jul-1951  Today's Date: 08/26/2024 PT Individual Time: 1100-1200 PT Individual Time Calculation (min): 60 min   Short Term Goals: Week 2:  PT Short Term Goal 1 (Week 2): STG = LTG d/t ELOS  SESSION 1 Skilled Therapeutic Interventions/Progress Updates: Patient sitting in recliner on entrance to room. Patient alert and agreeable to PT session.   Patient with no complaints of pain. Pt missed 15 due to need to discuss JP line placement later in afternoon with PA and nsg and so that pt could call family to tell them about surgery this afternoon (pt wanted to let them no ASAP).   Therapeutic Activity: Transfers: Pt performed sit<>stand transfers throughout session with close supervision and with RW. VC required for hand placement.  - Pt navigated 4 (6) steps with BHR and overall CGA to ascend, and then close supervision to descend. Pt step to pattern and required standing rest break at top of steps. Pt cued to ensure entire foot placed on step when ascending.  - Pt ambulated 90' at end of session with close supervision for safety and VC to maintain safe proximity to RW.  Therapeutic Exercise: Pt performed the following exercises with therapist providing the described cuing and facilitation for improvement. - Seated B Bicep curl 5lb bar and with forearm pronated. 3 x close to fatigue with rest break required throughout  - Seated B shoulder flexion with 3lb bar and cues to keep B elbows in extension   Patient sitting in recliner at end of session with brakes locked, and all needs within reach.  SESSION 2 Pt missed 75 min of skilled therapy due to being off unit for percutaneous drain placement. Will re-attempt as schedule and pt availability permits.     Therapy Documentation Precautions:  Precautions Precautions: Fall, Other (comment) Precaution/Restrictions Comments: JP drainx2 (L),  PEG tube Restrictions Weight Bearing Restrictions Per Provider Order: No  Therapy/Group: Individual Therapy  Calogero Geisen PTA 08/26/2024, 12:44 PM

## 2024-08-26 NOTE — Progress Notes (Signed)
 EKG order and done, PA notified with result. We will continue to monitor

## 2024-08-26 NOTE — Progress Notes (Signed)
 PROGRESS NOTE   Subjective/Complaints:  Episode of CP this am , no SOB< no radiation , does not feel like reflux (which she has had before)  resolved after no intervention , EKG reviewed no sig changes compared to prior on 10/1  ROS: Patient denies fever, rash, sore throat, blurred vision, dizziness, nausea, vomiting, diarrhea, cough, shortness of breath or chest pain, joint or back/neck pain, headache, or mood change.     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 Abdominal pain. * Tracking Code: BO * EXAM: CT ABDOMEN AND PELVIS WITH CONTRAST TECHNIQUE: Multidetector CT imaging of the abdomen and pelvis was performed using the standard protocol following bolus administration of intravenous contrast. RADIATION DOSE REDUCTION: This exam was performed according to the departmental dose-optimization program which includes automated exposure control, adjustment of the mA and/or kV according to patient size and/or use of iterative reconstruction technique. CONTRAST:  OMNIPAQUE  IOHEXOL  350 MG/ML SOLN COMPARISON:  07/30/2024 FINDINGS: Lower chest: Previous right pleural effusion has resolved. Small to moderate left pleural effusion persists with associated passive atelectasis. Mild cardiomegaly. Hepatobiliary: Fluid density lesion of the lateral segment left hepatic lobe on image 24 series 3 measuring 0.1 by 0.9 cm, favoring benign cyst. Pancreas: Highly indistinct porta hepatis and pancreaticoduodenal region in the vicinity of the Whipple procedure. Pancreaticojejunostomy and presumed choledocho jejunostomy poorly seen but no obvious dorsal pancreatic duct dilatation or intrahepatic biliary dilatation noted. Moderate  peripancreatic stranding may reflect ongoing pancreatitis. Curving 12.1 by 4.1 by 2.4 cm (volume = 62 cm^3) abscess extends above the Whipple site nearly to the hiatus, and tracks around the anterior margin of the pancreatic body to extend down anterior to the lower portion of the descending duodenum. This contains gas and fluid. A separate abscess tracks from the pancreatic tail down along the left anterior perirenal fascia and measures 11.5 by 3.4 by 3.0 cm (volume = 61 cm^3), with internal fluid and a small amount of gas along with thick enhancing margins. There is potentially a third small abscesses along the anterior gastrohepatic ligament region measuring 2.0 by 1.6 by 2.5 cm (volume = 4.2 cm^3) on image 26 series 3 A left-sided jejunostomy catheter is noted. A right upper quadrant drain extends just above the transverse colon but is not traversing the described abscesses. Spleen: Small amount of perisplenic ascites. Adrenals/Urinary Tract: Small left peripelvic renal cysts. Along the right kidney lower pole, a 3.7 by 1.9 by 2.0 cm (volume = 7.4 cm^3) collection with enhancing margins is noted and appears new compared to 07/30/2024, suspicious for a right perirenal abscess. Stomach/Bowel: Wall thickening in the stomach including in the vicinity of the presumed gastrojejunostomy. No dilated bowel. Vascular/Lymphatic: Notable central mesenteric adenopathy. Some of this may well be reactive. Index mesenteric node 2.1 cm in short axis on image 44 series 3, formerly 2.1 cm. Reproductive: Uterus absent. Other: Small but abnormal amount of free pelvic fluid in the cul-de-sac. Mild thickening along the anterior peritoneal reflection especially on the right side  as on image 38 series 3, this may be inflammatory and is less likely to represent tumor deposition. Small amount of fluid in the right paracolic gutter and tracking down in the right retroperitoneum. Mild mesenteric and omental edema. Musculoskeletal: Endplate  sclerosis with nitrogen gas phenomenon endplate irregularity at L3-4, similar back through 07/04/2024, degenerative findings favored over discitis-osteomyelitis. Lumbar spondylosis and degenerative disc disease causing substantial impingement at the L3-4 and L4-5 levels. IMPRESSION: 1. Multiple abscesses in the upper abdomen, including a 62 cc abscess extending above the Whipple site nearly to the hiatus, and a separate 61 cc abscess tracking from the pancreatic tail down along the left anterior perirenal fascia. There is potentially a third small abscess along the anterior gastrohepatic ligament region. 2. 7.4 cc right lower pole perirenal abscess. 3. Small to moderate left pleural effusion with associated passive atelectasis. 4. Mild cardiomegaly. 5. Notable central mesenteric adenopathy, some of this may well be reactive. 6. Small but abnormal amount of free pelvic fluid in the cul-de-sac. 7. Mild thickening along the anterior peritoneal reflection especially on the right side, this may be inflammatory and is less likely to represent tumor deposition. 8. Lumbar spondylosis and degenerative disc disease causing substantial impingement at the L3-4 and L4-5 levels. 9. Endplate sclerosis with nitrogen gas phenomenon endplate irregularity at L3-4, similar back through 07/04/2024, degenerative findings favored over discitis-osteomyelitis. Electronically Signed   By: Ryan Salvage M.D.   On: 08/25/2024 18:54      Recent Labs    08/25/24 0604  WBC 7.8  HGB 8.4*  HCT 26.4*  PLT 422*    Recent Labs    08/25/24 0604  NA 131*  K 5.0  CL 103  CO2 17*  GLUCOSE 359*  BUN 18  CREATININE 0.99  CALCIUM  8.1*     Intake/Output Summary (Last 24 hours) at 08/26/2024 0833 Last data filed at 08/26/2024 0442 Gross per 24 hour  Intake 200 ml  Output 105 ml  Net 95 ml        Physical Exam: Vital Signs Blood pressure (!) 139/56, pulse 86, temperature 99.1 F (37.3 C), temperature source Oral,  resp. rate 18, height 5' 8 (1.727 m), weight 110 kg, SpO2 99%.   General: No acute distress Mood and affect are appropriate Heart: Regular rate and rhythm no rubs murmurs or extra sounds Lungs: Clear to auscultation, breathing unlabored, no rales or wheezes Abdomen: Positive bowel sounds, soft nontender to palpation, nondistended Extremities: No clubbing, cyanosis, or edema Skin: No evidence of breakdown, no evidence of rash   Skin:  Midline incision cas below Neuro: AAOx4. No apparent cognitive deficits    Sensory exam: revealed normal sensation in all dermatomal regions in bilateral upper extremities and bilateral lower extremities Motor exam: strength 5-/5 throughout bilateral upper extremities and bilateral lower extremities Coordination: Fine motor coordination was normal.        Assessment/Plan: 1. Functional deficits which require 3+ hours per day of interdisciplinary therapy in a comprehensive inpatient rehab setting. Physiatrist is providing close team supervision and 24 hour management of active medical problems listed below. Physiatrist and rehab team continue to assess barriers to discharge/monitor patient progress toward functional and medical goals  Care Tool:  Bathing    Body parts bathed by patient: Right arm, Left arm, Chest, Abdomen, Front perineal area, Buttocks, Right upper leg, Left upper leg, Face     Body parts n/a: Right lower leg, Left lower leg   Bathing assist Assist Level: Minimal Assistance - Patient >  75%     Upper Body Dressing/Undressing Upper body dressing   What is the patient wearing?: Pull over shirt    Upper body assist Assist Level: Set up assist    Lower Body Dressing/Undressing Lower body dressing      What is the patient wearing?: Pants     Lower body assist Assist for lower body dressing: Moderate Assistance - Patient 50 - 74%     Toileting Toileting    Toileting assist Assist for toileting: Total Assistance -  Patient < 25%     Transfers Chair/bed transfer  Transfers assist  Chair/bed transfer activity did not occur: Safety/medical concerns (2/2 high fatigue)  Chair/bed transfer assist level: Supervision/Verbal cueing     Locomotion Ambulation   Ambulation assist   Ambulation activity did not occur: Safety/medical concerns (2/2 high fatigue)  Assist level: Contact Guard/Touching assist Assistive device: Walker-rolling Max distance: 75   Walk 10 feet activity   Assist  Walk 10 feet activity did not occur: Safety/medical concerns (2/2 high fatigue)  Assist level: Contact Guard/Touching assist Assistive device: Walker-rolling   Walk 50 feet activity   Assist Walk 50 feet with 2 turns activity did not occur: Safety/medical concerns (2/2 high fatigue)  Assist level: Contact Guard/Touching assist Assistive device: Walker-rolling    Walk 150 feet activity   Assist Walk 150 feet activity did not occur: Safety/medical concerns         Walk 10 feet on uneven surface  activity   Assist Walk 10 feet on uneven surfaces activity did not occur: Safety/medical concerns (2/2 high fatigue)         Wheelchair     Assist Is the patient using a wheelchair?: Yes Type of Wheelchair: Manual Wheelchair activity did not occur: Safety/medical concerns (2/2 high fatigue)         Wheelchair 50 feet with 2 turns activity    Assist    Wheelchair 50 feet with 2 turns activity did not occur: Safety/medical concerns (2/2 high fatigue)       Wheelchair 150 feet activity     Assist  Wheelchair 150 feet activity did not occur: Safety/medical concerns (2/2 high fatigue)       Blood pressure (!) 139/56, pulse 86, temperature 99.1 F (37.3 C), temperature source Oral, resp. rate 18, height 5' 8 (1.727 m), weight 110 kg, SpO2 99%.   Medical Problem List and Plan: 1. 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  Antithrombotics: -DVT/anticoagulation:  Pharmaceutical: Lovenox              -antiplatelet therapy: N/A 3. Pain Management: Tylenol  prn.  .    Pain improving --uses tramadol  on occasion 4. Mood/Behavior/Sleep: LCSW to follow for evaluation and support.              -antipsychotic agents: N/A 5. Neuropsych/cognition: This patient is capable of making decisions on her own behalf. 6. Skin/Wound Care: Monitor wound for healing. PICC site looking ok but may d/c in am since no longer receiving TPN or IVF/IV meds  10/13 RUQ JP with leak. Tegaderm per Surgery with suction regained. I see no imminent plans to change out tubing.    -will need follow up prior to  dc Friday   -JP still with milky discharge, 10-15 cc in bulb this am 7. Fluids/Electrolytes/Nutrition: Monitor I/O. Continue tube feeds    Latest Ref Rng & Units 08/25/2024    6:04 AM 08/21/2024    5:06 AM 08/18/2024    4:30 AM  BMP  Glucose 70 - 99 mg/dL 640  709  713   BUN 8 - 23 mg/dL 18  24  22    Creatinine 0.44 - 1.00 mg/dL 9.00  8.81  8.97   Sodium 135 - 145 mmol/L 131  135  137   Potassium 3.5 - 5.1 mmol/L 5.0  4.1  4.0   Chloride 98 - 111 mmol/L 103  105  105   CO2 22 - 32 mmol/L 17  20  20    Calcium  8.9 - 10.3 mg/dL 8.1  8.1  7.9     89/86 Cr improved. Continue to encourage PO 8. Duodenal adenocarcinoma w/gastric outlet obst:  continues on tube feed for nutritional support.  Improved motility passing gas but may have mild ileus per Gen surg, tolerated po vegetables and potatoes yesterday   9. Abdominal distension:  improve motility lass stomach gas, some mild ileus noted per Gen surg  only ~50mg  tramadol  per day     This is likely etiology of chest pain 10. Fluid overload/Pleural effusions: In part due to third spacing. S/p thoracocentesis.  Right side improved , left  side    11. HTN: Monitor BP TID--on amlodipine , catapres  and coreg              - Add hydralazine  25 mg Q8H PRN for HTN >170 SBP or >100 DBP, given recent highs   10/4-10/5 BP running low 140s- con't regimen Vitals:   08/26/24 0315 08/26/24 0611  BP: (!) 140/58 (!) 139/56  Pulse: 92 86  Resp: 18 18  Temp: 98.3 F (36.8 C) 99.1 F (37.3 C)  SpO2: 100% 99%  Controlled 10/14 12.  Prediabetes: Hgb A1c- 6.1. Elevated BS due to tube feeds             --continue Lantus  with meal coverage and SSI             --monitor BS ac/hs and use SSI for elevated BS   CBG (last 3)  Recent Labs    08/25/24 1636 08/25/24 2127 08/26/24 0610  GLUCAP 152* 268* 323*  Increase lantus  10/12 to 20U, minimal po intake , no change in TF since 10/9 Am CBG still elevated increased Lantus  to 22U BID 10/14 13. Acute on chronic kidney disease: SCr 1.3 at admission and now WNL 14.  Obesity  Class 2: BMI 38.47 15.  ABLA: Recheck CBC in am. Hgb 8-9 range.   Stable 10/13    Latest Ref Rng & Units 08/25/2024    6:04 AM 08/21/2024    5:06 AM 08/20/2024    4:26 AM  CBC  WBC 4.0 - 10.5 K/uL 7.8  6.8  6.7   Hemoglobin 12.0 - 15.0 g/dL 8.4  8.4  8.9   Hematocrit 36.0 - 46.0 % 26.4  27.3  29.0   Platelets 150 - 400 K/uL 422  310  329     16. Severe protein malnutrition- J tube placed 9/9 Albumin  of 1.5- is declining-    17. Thrush  Resolved after nystatin  and diflucan   Ask dietary to follow oral vs enteral intake      LOS: 11 days A FACE TO FACE EVALUATION WAS PERFORMED  Lauren Gray 08/26/2024,  8:33 AM

## 2024-08-26 NOTE — Progress Notes (Signed)
 Ok to give lantus  4units x1 for elevated CBG this AM before the lantus  dose increase this PM per Dr. Carilyn.  Sergio Batch, PharmD, BCIDP, AAHIVP, CPP Infectious Disease Pharmacist 08/26/2024 10:11 AM

## 2024-08-26 NOTE — Progress Notes (Signed)
   Subjective/Chief Complaint:  Patient is having some nausea/vomiting.  One drain fell out over the weekend.  Second drain output has picked up, but sometimes isn't charged.     Objective: Vital signs in last 24 hours: Temp:  [97.4 F (36.3 C)-99.1 F (37.3 C)] 99.1 F (37.3 C) (10/14 0611) Pulse Rate:  [80-92] 86 (10/14 0611) Resp:  [17-18] 18 (10/14 0611) BP: (126-142)/(49-58) 139/56 (10/14 0611) SpO2:  [99 %-100 %] 99 % (10/14 0611) Last BM Date : 08/25/24  Intake/Output from previous day: 10/13 0701 - 10/14 0700 In: 200 [NG/GT:200] Out: 105 [Drains:105] Intake/Output this shift: No intake/output data recorded.  PE:  Constitutional: No acute distress, conversant Eyes: Anicteric sclerae Lungs: normal respiratory effort GI: Soft, non-tender to palpation, Dr #2 in place and with milky yellow output - was not on suction but when I placed to suction it held charge.  Psychiatric: appropriate judgment and insight, oriented to person, place, and time   Lab Results:  Recent Labs    08/25/24 0604  WBC 7.8  HGB 8.4*  HCT 26.4*  PLT 422*   BMET Recent Labs    08/25/24 0604  NA 131*  K 5.0  CL 103  CO2 17*  GLUCOSE 359*  BUN 18  CREATININE 0.99  CALCIUM  8.1*  Anti-infectives: Anti-infectives (From admission, onward)    Start     Dose/Rate Route Frequency Ordered Stop   08/18/24 0800  fluconazole  (DIFLUCAN ) tablet 200 mg  Status:  Discontinued        200 mg Oral Daily 08/17/24 0819 08/25/24 0958   08/17/24 1000  fluconazole  (DIFLUCAN ) tablet 400 mg        400 mg Oral  Once 08/17/24 0819 08/17/24 1002       Assessment/Plan: s/p Whipple,  jejunostomy tube Dr. 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  Margins negative.  18 h tube feed cycle for full calorie intake; currently decreased to 14 hours  Calorie counts.  Megace for appetite stimulant.   Decreased tube feeds 10/9 as above. -Dr #2 in place and thankfully output picked up a bit.  Will consult IR to see if they feel they can place drain in one or more of the fluid collection along the tract of the prior drain that fell out.  It was going across the abdomen so it appears that several fluid collections have accumulated as it was putting out 300-500 ml/day.     Concern from nutrition about not being hungry due to tube feeds- this is certainly a reasonable concern. Unfortunately, the pancreatic leak will not heal until her nutrition is significantly improved.  I am hesitant to drop tube feeds and would rather her not have good oral intake than be underfed at this time.  Continue megace.    Dispo - per PM&R.     LOS: 11 days    Jina LITTIE Aron, MD, FACS, FSSO Surgical Oncology, General Surgery, Trauma and Critical [facility] Surgery, [place] [phone] for weekday/non holidays Check [email or web address] for coverage night/weekend/holidays

## 2024-08-26 NOTE — Plan of Care (Signed)
  Problem: RH BOWEL ELIMINATION Goal: RH STG MANAGE BOWEL WITH ASSISTANCE Description: STG Manage Bowel with mod I Assistance. Outcome: Progressing   Problem: RH SAFETY Goal: RH STG ADHERE TO SAFETY PRECAUTIONS W/ASSISTANCE/DEVICE Description: STG Adhere to Safety Precautions With cues Assistance/Device. Outcome: Progressing   Problem: RH PAIN MANAGEMENT Goal: RH STG PAIN MANAGED AT OR BELOW PT'S PAIN GOAL Description: Pain < 4 with prns Outcome: Progressing   Problem: RH KNOWLEDGE DEFICIT GENERAL Goal: RH STG INCREASE KNOWLEDGE OF SELF CARE AFTER HOSPITALIZATION Description: Patient and spouse will be able to manage care at discharge using educational resources independently Outcome: Progressing

## 2024-08-27 LAB — GLUCOSE, CAPILLARY
Glucose-Capillary: 336 mg/dL — ABNORMAL HIGH (ref 70–99)
Glucose-Capillary: 229 mg/dL — ABNORMAL HIGH (ref 70–99)
Glucose-Capillary: 167 mg/dL — ABNORMAL HIGH (ref 70–99)
Glucose-Capillary: 202 mg/dL — ABNORMAL HIGH (ref 70–99)

## 2024-08-27 MED ORDER — INSULIN GLARGINE 100 UNIT/ML ~~LOC~~ SOLN
30.0000 [IU] | Freq: Every day | SUBCUTANEOUS | Status: DC
  Filled 2024-08-27: qty 0.3

## 2024-08-27 MED ORDER — INSULIN GLARGINE-YFGN 100 UNIT/ML ~~LOC~~ SOLN
30.0000 [IU] | Freq: Every day | SUBCUTANEOUS | Status: AC
Start: 2024-08-27 — End: ?
  Administered 2024-08-27 – 2024-08-28 (×2): 30 [IU] via SUBCUTANEOUS
  Filled 2024-08-27 (×3): qty 0.3

## 2024-08-27 NOTE — Progress Notes (Signed)
 PROGRESS NOTE   Subjective/Complaints:  Appreciate interventional radiology note, patient without nausea and vomiting this morning.  Had issues with attempted p.o. feeds.  Still n.p.o. from procedure.  Per general surgery patient has the priority of getting adequate nutrition via tube versus trying to advance p.o.  Also not tolerating advancing p.o. well.  ROS: Patient denies fever, rash, sore throat, blurred vision, dizziness, nausea, vomiting, diarrhea, cough, shortness of breath or chest pain, joint or back/neck pain, headache, or mood change.     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 EXAM: CT-GUIDED RIGHT UPPER QUADRANT ABSCESS DRAINAGE CATHETER PLACEMENT COMPARISON:  CT AP, 08/25/2024. MEDICATIONS: The patient is currently admitted to the hospital and receiving intravenous antibiotics. The antibiotics were administered within an appropriate time frame prior to the initiation of the procedure. ANESTHESIA/SEDATION: Moderate (conscious) sedation was employed during this procedure. A total of Versed  1 mg and Fentanyl  100 mcg was administered intravenously. Moderate Sedation Time: 24 minutes. The patient's level of consciousness and vital signs were monitored continuously by radiology nursing throughout the procedure under my direct supervision. CONTRAST:  None FLUOROSCOPY TIME:  CT dose; 1422 mGycm COMPLICATIONS: None immediate. PROCEDURE: RADIATION DOSE REDUCTION: This exam was performed according to the departmental dose-optimization program which includes automated exposure control, adjustment of the mA and/or kV according to patient size and/or use of iterative reconstruction technique. Informed written consent was obtained from the patient and/or  patient's representative after a discussion of the risks, benefits and alternatives to treatment. The patient was placed supine on the CT gantry and a pre procedural CT was performed re-demonstrating the known abscess/fluid collection within the RIGHT upper quadrant. The procedure was planned. A timeout was performed prior to the initiation of the procedure. The RIGHT upper quadrant was prepped and draped in the usual sterile fashion. The overlying soft tissues were anesthetized with 1% lidocaine  with epinephrine. Appropriate trajectory was planned with the use of a 22 gauge spinal needle. An 18 gauge trocar needle was advanced into the abscess/fluid collection and a short Amplatz super stiff wire was coiled within the collection. Appropriate positioning was confirmed with a limited CT scan. The tract was serially dilated allowing placement of a 12 Fr drainage catheter. Appropriate positioning was confirmed with a limited postprocedural CT scan. 5 mL of purulent serosanguineous fluid was aspirated. The tube was connected to bulb suction and sutured in place. A dressing was placed. The patient tolerated the procedure well without immediate post procedural complication. IMPRESSION: Successful CT-guided placement of a 12 Fr drainage catheter into the location with aspiration of 5 mL of purulent serosanguineous fluid. Samples were sent to the laboratory as requested by the ordering clinical team. RECOMMENDATIONS: The patient will return to Vascular Interventional Radiology (VIR) for routine drainage catheter evaluation and exchange in 7-10 days. Thom Hall, MD [facility] Electronically Signed   By: Thom Hall M.D.   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 Abdominal pain. * Tracking Code: BO * EXAM: CT ABDOMEN AND PELVIS WITH CONTRAST TECHNIQUE: Multidetector CT imaging of the abdomen and pelvis was performed using the standard protocol following bolus administration of intravenous contrast. RADIATION DOSE REDUCTION: This exam was performed according to the departmental dose-optimization program which includes automated exposure control, adjustment of the mA and/or kV according to patient size and/or use of iterative reconstruction technique. CONTRAST:  OMNIPAQUE  IOHEXOL  350 MG/ML SOLN COMPARISON:  07/30/2024 FINDINGS: Lower chest: Previous right pleural effusion has resolved. Small to moderate left pleural effusion persists with associated passive atelectasis. Mild cardiomegaly. Hepatobiliary: Fluid density lesion of the lateral segment left hepatic lobe on image 24 series 3 measuring 0.1 by 0.9 cm, favoring benign cyst. Pancreas: Highly indistinct porta hepatis and pancreaticoduodenal region in the vicinity of the Whipple procedure. Pancreaticojejunostomy and presumed choledocho jejunostomy poorly seen but no obvious dorsal pancreatic duct dilatation or intrahepatic biliary dilatation noted. Moderate peripancreatic stranding may reflect ongoing pancreatitis. Curving 12.1 by 4.1 by 2.4 cm (volume = 62 cm^3) abscess extends above the Whipple site nearly to the hiatus, and tracks around the anterior margin of the pancreatic body to extend down anterior to the lower portion of the descending duodenum. This contains gas and fluid. A separate abscess tracks from the pancreatic tail down along the left anterior perirenal fascia and measures 11.5 by 3.4 by 3.0 cm (volume = 61 cm^3), with internal fluid and a small amount of gas along with thick enhancing margins. There is potentially a third small abscesses along the anterior gastrohepatic ligament region measuring 2.0 by 1.6 by 2.5 cm (volume = 4.2 cm^3) on image 26 series 3 A  left-sided jejunostomy catheter is noted. A right upper quadrant drain extends just above the transverse colon but is not traversing the described abscesses. Spleen: Small amount of perisplenic ascites. Adrenals/Urinary Tract: Small left peripelvic renal cysts. Along the right kidney lower pole, a 3.7 by 1.9 by 2.0 cm (volume = 7.4 cm^3) collection with enhancing margins is noted and appears new compared to 07/30/2024, suspicious for a right perirenal abscess. Stomach/Bowel: Wall thickening in the stomach including in the vicinity of the presumed gastrojejunostomy. No dilated bowel. Vascular/Lymphatic: Notable central mesenteric adenopathy. Some of this may well be reactive. Index mesenteric node 2.1 cm in short axis on image 44 series 3, formerly 2.1 cm. Reproductive: Uterus absent. Other: Small but abnormal amount of free pelvic fluid in the cul-de-sac. Mild thickening along the anterior peritoneal reflection especially on the right side as on image 38 series 3, this may be inflammatory and is less likely to represent tumor deposition. Small amount of fluid in the right paracolic gutter and tracking down in the right retroperitoneum. Mild mesenteric and omental edema. Musculoskeletal: Endplate sclerosis with nitrogen gas phenomenon endplate irregularity at L3-4, similar back through 07/04/2024, degenerative findings favored over discitis-osteomyelitis. Lumbar spondylosis and degenerative disc disease causing substantial impingement at the L3-4 and L4-5 levels. IMPRESSION: 1. Multiple abscesses in the upper abdomen, including a 62 cc abscess extending above the Whipple site nearly to the hiatus, and a separate 61 cc abscess tracking from the pancreatic tail down along the left anterior perirenal fascia. There is potentially a third small abscess along the anterior gastrohepatic ligament region. 2. 7.4 cc right lower pole perirenal abscess. 3. Small to moderate left pleural effusion with associated passive  atelectasis. 4. Mild  cardiomegaly. 5. Notable central mesenteric adenopathy, some of this may well be reactive. 6. Small but abnormal amount of free pelvic fluid in the cul-de-sac. 7. Mild thickening along the anterior peritoneal reflection especially on the right side, this may be inflammatory and is less likely to represent tumor deposition. 8. Lumbar spondylosis and degenerative disc disease causing substantial impingement at the L3-4 and L4-5 levels. 9. Endplate sclerosis with nitrogen gas phenomenon endplate irregularity at L3-4, similar back through 07/04/2024, degenerative findings favored over discitis-osteomyelitis. Electronically Signed   By: Ryan Salvage M.D.   On: 08/25/2024 18:54      Recent Labs    08/25/24 0604  WBC 7.8  HGB 8.4*  HCT 26.4*  PLT 422*    Recent Labs    08/25/24 0604 08/26/24 1042  NA 131* 133*  K 5.0 4.8  CL 103 103  CO2 17* 18*  GLUCOSE 359* 143*  BUN 18 20  CREATININE 0.99 1.02*  CALCIUM  8.1* 8.2*     Intake/Output Summary (Last 24 hours) at 08/27/2024 9077 Last data filed at 08/27/2024 0647 Gross per 24 hour  Intake 300 ml  Output 50 ml  Net 250 ml        Physical Exam: Vital Signs Blood pressure (!) 126/51, pulse 82, temperature 98 F (36.7 C), resp. rate 18, height 5' 8 (1.727 m), weight 110 kg, SpO2 99%.   General: No acute distress Mood and affect are appropriate Heart: Regular rate and rhythm no rubs murmurs or extra sounds Lungs: Clear to auscultation, breathing unlabored, no rales or wheezes Abdomen: Positive bowel sounds, soft nontender to palpation, nondistended Extremities: No clubbing, cyanosis, or edema Skin: No evidence of breakdown, no evidence of rash   Skin:  Midline incision cas below Neuro: AAOx4. No apparent cognitive deficits    Sensory exam: revealed normal sensation in all dermatomal regions in bilateral upper extremities and bilateral lower extremities Motor exam: strength 5-/5 throughout  bilateral upper extremities and bilateral lower extremities Coordination: Fine motor coordination was normal.        Assessment/Plan: 1. Functional deficits which require 3+ hours per day of interdisciplinary therapy in a comprehensive inpatient rehab setting. Physiatrist is providing close team supervision and 24 hour management of active medical problems listed below. Physiatrist and rehab team continue to assess barriers to discharge/monitor patient progress toward functional and medical goals  Care Tool:  Bathing    Body parts bathed by patient: Right arm, Left arm, Chest, Abdomen, Front perineal area, Buttocks, Right upper leg, Left upper leg, Face     Body parts n/a: Right lower leg, Left lower leg   Bathing assist Assist Level: Minimal Assistance - Patient > 75%     Upper Body Dressing/Undressing Upper body dressing   What is the patient wearing?: Pull over shirt    Upper body assist Assist Level: Set up assist    Lower Body Dressing/Undressing Lower body dressing      What is the patient wearing?: Pants     Lower body assist Assist for lower body dressing: Moderate Assistance - Patient 50 - 74%     Toileting Toileting    Toileting assist Assist for toileting: Total Assistance - Patient < 25%     Transfers Chair/bed transfer  Transfers assist  Chair/bed transfer activity did not occur: Safety/medical concerns (2/2 high fatigue)  Chair/bed transfer assist level: Supervision/Verbal cueing     Locomotion Ambulation   Ambulation assist   Ambulation activity did not occur: Safety/medical concerns (2/2 high  fatigue)  Assist level: Contact Guard/Touching assist Assistive device: Walker-rolling Max distance: 75   Walk 10 feet activity   Assist  Walk 10 feet activity did not occur: Safety/medical concerns (2/2 high fatigue)  Assist level: Contact Guard/Touching assist Assistive device: Walker-rolling   Walk 50 feet activity   Assist Walk 50  feet with 2 turns activity did not occur: Safety/medical concerns (2/2 high fatigue)  Assist level: Contact Guard/Touching assist Assistive device: Walker-rolling    Walk 150 feet activity   Assist Walk 150 feet activity did not occur: Safety/medical concerns         Walk 10 feet on uneven surface  activity   Assist Walk 10 feet on uneven surfaces activity did not occur: Safety/medical concerns (2/2 high fatigue)         Wheelchair     Assist Is the patient using a wheelchair?: Yes Type of Wheelchair: Manual Wheelchair activity did not occur: Safety/medical concerns (2/2 high fatigue)         Wheelchair 50 feet with 2 turns activity    Assist    Wheelchair 50 feet with 2 turns activity did not occur: Safety/medical concerns (2/2 high fatigue)       Wheelchair 150 feet activity     Assist  Wheelchair 150 feet activity did not occur: Safety/medical concerns (2/2 high fatigue)       Blood pressure (!) 126/51, pulse 82, temperature 98 F (36.7 C), resp. rate 18, height 5' 8 (1.727 m), weight 110 kg, SpO2 99%.   Medical Problem List and Plan: 1. 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  Antithrombotics: -DVT/anticoagulation:  Pharmaceutical: Lovenox              -antiplatelet therapy: N/A 3. Pain Management: Tylenol  prn.  .    Pain improving --uses tramadol  on occasion 4. Mood/Behavior/Sleep: LCSW to follow for evaluation and support.              -antipsychotic agents: N/A 5. Neuropsych/cognition: This patient is capable of making decisions on her own behalf. 6. Skin/Wound Care: Monitor wound for healing. PICC site looking ok but may d/c in am since no longer receiving TPN or IVF/IV meds  10/13 RUQ JP with leak. Tegaderm per Surgery with  suction regained. I see no imminent plans to change out tubing.    -will need follow up prior to dc Friday   -JP still with milky discharge, 10-15 cc in bulb this am 7. Fluids/Electrolytes/Nutrition: Monitor I/O. Continue tube feeds    Latest Ref Rng & Units 08/26/2024   10:42 AM 08/25/2024    6:04 AM 08/21/2024    5:06 AM  BMP  Glucose 70 - 99 mg/dL 856  640  709   BUN 8 - 23 mg/dL 20  18  24    Creatinine 0.44 - 1.00 mg/dL 8.97  9.00  8.81   Sodium 135 - 145 mmol/L 133  131  135   Potassium 3.5 - 5.1 mmol/L 4.8  5.0  4.1   Chloride 98 - 111 mmol/L 103  103  105   CO2 22 - 32 mmol/L 18  17  20    Calcium  8.9 - 10.3 mg/dL 8.2  8.1  8.1  10/13 Cr improved. Continue to encourage PO 8. Duodenal adenocarcinoma w/gastric outlet obst:  continues on tube feed for nutritional support.  Improved motility passing gas but may have mild ileus per Gen surg, tolerated po vegetables and potatoes yesterday   9. Abdominal distension:  improve motility lass stomach gas, some mild ileus noted per Gen surg  only ~50mg  tramadol  per day     Chest pain resolved off of p.o. feeds 10. Fluid overload/Pleural effusions: In part due to third spacing. S/p thoracocentesis.  Right side improved , left side    11. HTN: Monitor BP TID--on amlodipine , catapres  and coreg              - Add hydralazine  25 mg Q8H PRN for HTN >170 SBP or >100 DBP, given recent highs   10/4-10/5 BP running low 140s- con't regimen Vitals:   08/26/24 2223 08/27/24 0552  BP: (!) 142/54 (!) 126/51  Pulse:  82  Resp:  18  Temp:  98 F (36.7 C)  SpO2:  99%  Controlled 10/14 12.  Prediabetes: Hgb A1c- 6.1. Elevated BS due to tube feeds             --continue Lantus  with meal coverage and SSI             --monitor BS ac/hs and use SSI for elevated BS   CBG (last 3)  Recent Labs    08/26/24 1650 08/26/24 2119 08/27/24 0553  GLUCAP 94 145* 336*  Increase lantus  10/12 to 20U, minimal po intake , no change in TF since 10/9 Am CBG  still elevated increased Lantus  to 22U BID 10/14, daytime CBGs are okay a.m. is the main issue.  Will change Lantus  to nightly, otherwise may need to give NovoLog  higher dose at that time.  See orders 13. Acute on chronic kidney disease: SCr 1.3 at admission and now WNL 14.  Obesity  Class 2: BMI 38.47 15.  ABLA: Recheck CBC in am. Hgb 8-9 range.   Stable 10/13    Latest Ref Rng & Units 08/25/2024    6:04 AM 08/21/2024    5:06 AM 08/20/2024    4:26 AM  CBC  WBC 4.0 - 10.5 K/uL 7.8  6.8  6.7   Hemoglobin 12.0 - 15.0 g/dL 8.4  8.4  8.9   Hematocrit 36.0 - 46.0 % 26.4  27.3  29.0   Platelets 150 - 400 K/uL 422  310  329     16. Severe protein malnutrition- J tube placed 9/9 Albumin  of 1.5- is declining-    17. Thrush  Resolved after nystatin  and diflucan   Ask dietary to follow oral vs enteral intake      LOS: 12 days A FACE TO FACE EVALUATION WAS PERFORMED  Lauren Gray 08/27/2024, 9:22 AM

## 2024-08-27 NOTE — Progress Notes (Signed)
   Subjective/Chief Complaint:  Second surgical drain came out today. Had around 60 mL output.   IR placed a drain yesterday.  Output c/w panc fluid.  No n/v.    Objective: Vital signs in last 24 hours: Temp:  [97.4 F (36.3 C)-98.6 F (37 C)] 97.4 F (36.3 C) (10/15 1508) Pulse Rate:  [82-104] 83 (10/15 1508) Resp:  [17-20] 20 (10/15 1508) BP: (119-148)/(47-59) 119/47 (10/15 1508) SpO2:  [97 %-100 %] 100 % (10/15 1508) Last BM Date : 08/27/24  Intake/Output from previous day: 10/14 0701 - 10/15 0700 In: 300 [NG/GT:300] Out: 50 [Drains:50] Intake/Output this shift: Total I/O In: 200 [P.O.:100; NG/GT:100] Out: -   PE:  Constitutional: OOB in wheelchair.   Eyes: Anicteric sclerae Lungs: normal respiratory effort GI: Soft, non-tender to palpation, IR drain with clearish output.  Psychiatric: appropriate judgment and insight, oriented to person, place, and time   Lab Results:  Recent Labs    08/25/24 0604  WBC 7.8  HGB 8.4*  HCT 26.4*  PLT 422*   BMET Recent Labs    08/25/24 0604 08/26/24 1042  NA 131* 133*  K 5.0 4.8  CL 103 103  CO2 17* 18*  GLUCOSE 359* 143*  BUN 18 20  CREATININE 0.99 1.02*  CALCIUM  8.1* 8.2*  Anti-infectives: Anti-infectives (From admission, onward)    Start     Dose/Rate Route Frequency Ordered Stop   08/18/24 0800  fluconazole  (DIFLUCAN ) tablet 200 mg  Status:  Discontinued        200 mg Oral Daily 08/17/24 0819 08/25/24 0958   08/17/24 1000  fluconazole  (DIFLUCAN ) tablet 400 mg        400 mg Oral  Once 08/17/24 0819 08/17/24 1002       Assessment/Plan: s/p Whipple,  jejunostomy tube Dr. 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  Margins negative.  18 h tube feed cycle for full calorie intake; currently decreased to 14 hours  Calorie counts.  Megace for appetite stimulant.  Decreased tube feeds 10/9 as above. IR drain  in place.  No surgical drains remaining.  Hold on repeat CT unless fever or other indication.  Anticipate drainage from skin hole from drain.    Dispo - per PM&R. Tentative d/c planned 10/21.     LOS: 12 days    Jina LITTIE Aron, MD, FACS, FSSO Surgical Oncology, General Surgery, Trauma and Critical [facility] Surgery, [place] [phone] for weekday/non holidays Check [email or web address] for coverage night/weekend/holidays

## 2024-08-27 NOTE — Progress Notes (Signed)
 Occupational Therapy Session Note  Patient Details  Name: Lauren Gray MRN: [id number] Date of Birth: 1950-12-16  Today's Date: 08/27/2024 OT Missed Time: 45 Minutes Missed Time Reason: Patient fatigue;Pain   Short Term Goals: Week 1:  OT Short Term Goal 1 (Week 1): The pt safely transfer to all surface with MinA using AE as needed. OT Short Term Goal 1 - Progress (Week 1): Met OT Short Term Goal 2 (Week 1): The pt will bathe/dress LB safely with MinA using AE as needed.. OT Short Term Goal 2 - Progress (Week 1): Progressing toward goal OT Short Term Goal 3 (Week 1): The pt will safely participate in > 30 minutes of Occupational Therapy  activity with minimal rest breaks following demonstration and initial cues. OT Short Term Goal 3 - Progress (Week 1): Progressing toward goal OT Short Term Goal 4 (Week 1): The pt will safely demonstrate a simple homemaking task at [facility] after demonstration and initial cues. OT Short Term Goal 4 - Progress (Week 1): Progressing toward goal Week 2:  OT Short Term Goal 1 (Week 2): Pt willl be able to stand and manage clothing over hips with min A pre and post toileting. OT Short Term Goal 2 (Week 2): Pt will be able to don pants over feet using reacher with min A. OT Short Term Goal 3 (Week 2): Pt will be able to walk in and out of the bathroom with CGA and no symptoms of shortness of breath to demonstrate improved activity tolerance.  Skilled Therapeutic Interventions/Progress Updates:    Pt received in bed with spouse and RN in the room. Pt stated she was not feeling well due to the late procedure and being fatigued from the procedure.  Pt declined therapy at this time.  (Pt had IR place a new tube).    Therapy Documentation Precautions:  Precautions Precautions: Fall, Other (comment) Precaution/Restrictions Comments: JP drainx2 (L), PEG tube Restrictions Weight Bearing Restrictions Per Provider Order: No General: General OT Amount of Missed Time:  45 Minutes    Pain:  8/10 abdomen ADL: ADL Equipment Provided: Reacher Eating: Other (comment) (feeding tube) Where Assessed-Eating: Bed level Grooming: Setup Where Assessed-Grooming: Sitting at sink Upper Body Bathing: Setup Where Assessed-Upper Body Bathing: Sitting at sink Lower Body Bathing: Moderate assistance, Minimal assistance Where Assessed-Lower Body Bathing: Edge of bed Upper Body Dressing: Setup Where Assessed-Upper Body Dressing: Edge of bed Lower Body Dressing: Moderate assistance Where Assessed-Lower Body Dressing: Edge of bed Toileting: Minimal assistance, Moderate assistance (based on performance with bathing at sink LOF) Where Assessed-Toileting: Other (Comment) Toilet Transfer: Minimal assistance, Moderate assistance Toilet Transfer Method: Ambulating Toilet Transfer Equipment: Extra wide bedside commode, Grab bars (based on coming from sit to stand at bed and sink LOF) Tub/Shower Transfer: Minimal assistance, Moderate assistance Tub/Shower Transfer Method: Ship broker: Shower seat with back, Walk in Music therapist: Minimal assistance, Moderate assistance Film/video editor Method:  (based on patient's ability to come from sit to stand and ambulate along side the bed) [employer]: Shower seat with back      Therapy/Group: Individual Therapy  Kourtnee Lahey 08/27/2024, 10:14 AM

## 2024-08-27 NOTE — Progress Notes (Addendum)
 Old RLQ drain fell out during therapy. Has had issues with leaking and maintaining charge. Continues to have purulent drainage 60 cc this am. Contacted IR who recommended repeating CT to see if collection has resolved or if drain needs to be replaced. Patient/husband updated. Paged surgery to update them.

## 2024-08-27 NOTE — Patient Care Conference (Signed)
 Inpatient RehabilitationTeam Conference and Plan of Care Update Date: 08/27/2024   Time: 10:05 AM    Patient Name: Lauren Gray      Medical Record Number: [id number]  Date of Birth: 08/15/1951 Sex: Female         Room/Bed: 4W20C/4W20C-01 Payor Info: Payor: HULAN / Plan: [facility] / Product Type: *No Product type* /    Admit Date/Time:  08/15/2024  8:31 PM  Primary Diagnosis:  Debility  Hospital Problems: Principal Problem:   Debility Active Problems:   Adjustment disorder with depressed mood   Malnutrition of moderate degree    Expected Discharge Date: Expected Discharge Date: 09/02/24  Team Members Present: Physician leading conference: Dr. Prentice Compton Nurse Present: Barnie Ronde, RN PT Present: Delinda Bertrand, PTA;Sherlean Perks, PT OT Present: Julia Saguier, OT SLP Present: Recardo Mole, SLP PPS Coordinator present : Eleanor Colon, SLP     Current Status/Progress Goal Weekly Team Focus  Bowel/Bladder   Pt is continent of Bladder and Bowel   Pt will remain continent of Bowel & Bladder   Toliet Q 4 while wake and prn    Swallow/Nutrition/ Hydration               ADL's   min A to manage pants over back of hips, able to don over feet with reacher,  Supervision with sit to stands and with RW, CGA without RW   supervision with self care (goals downgraded from admission)   ADL training, functional mobility,  pt educ, UE strengthening    Mobility   Bed mobility = CGA/supervision; Ambulation 100'+ CGA/supervision in RW; Stairs = CGA/close supervision;   Supervision  Endurance, ambulation, therex, family and pt ed, stair training    Communication                Safety/Cognition/ Behavioral Observations               Pain   Pt denies pain or discomfort   Pain score to remain less than or equal to 3 on the pain scale   Assess for pain Q shift and prn    Skin   Abdominal incision midline lower ABD 2 JP drains Right side of abdomen,  Peg site   Skin will remain free from s/sx of infection or breakdown.  Assess skin q shift and prn      Discharge Planning:  Working on discharge needs-unsure if tube feedings will be covered due to pt able to eat just not enough. Will order DME once have recommendation. Team needs to be training husband and daughter on drain and peg care. Husband here daily and helps some with her care. 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 Complicated by pleural effusions, anemia and pna; continue with JP drain x 2. Functional improvement limited by eccentric and self limiting behaviors with decreased activity tolerance and pain.  Patient on target to meet rehab goals: yes, currently needs min assist for lower body care and CGA - min assist for self care.  Needs supervision for ambulation using a walker and CGA for steps.  *See Care Plan and progress notes for long and short-term goals.   Revisions to Treatment Plan:  JP drain replacement 08/26/24   Teaching Needs: Safety, JP drain management, skin care, nutritional means, tube feeding/care of jejunostomy tube, CBG monitoring and insulin  administration, transfers, toileting, etc.  Current Barriers to  Discharge: Decreased caregiver support, Home enviroment access/layout, Nutritional means, and surgical drain management  Possible Resolutions to Barriers: Family education HH follow up services DME: RW     Medical Summary Current Status: Improved pain still has very poor p.o. intake, abdominal drain fell out, new 1 was replaced yesterday.  Barriers to Discharge: Inadequate Nutritional Intake   Possible Resolutions to [employer] Focus: Will need J-tube feeds upon discharge 12-hour 6P to 6 and a would be best, recommend n.p.o. until discussion with general surgery   Continued Need for Acute Rehabilitation Level of Care:  The patient requires daily medical management by a physician with specialized training in physical medicine and rehabilitation for the following reasons: Direction of a multidisciplinary physical rehabilitation program to maximize functional independence : Yes Medical management of patient stability for increased activity during participation in an intensive rehabilitation regime.: Yes Analysis of laboratory values and/or radiology reports with any subsequent need for medication adjustment and/or medical intervention. : Yes   I attest that I was present, lead the team conference, and concur with the assessment and plan of the team.   Fredericka Sober B 08/27/2024, 2:51 PM

## 2024-08-27 NOTE — Progress Notes (Signed)
 Patient ID: Lauren Gray, female   DOB: 03-09-1951, 73 y.o.   MRN: [id number]  Met wit pt and husband who is present to update both regarding team conference progress this week and with missed therapy sessions due to procedures and surgery will need to extend to next Tuesday 10/21. Both feel more comfortable with this and are in agreement. Have ordered tube feedings and pump via [employer] and discussed rolling walker and drop-arm bedside commode and both agree with this and this worker ordering them. Husband asked if pt would need a wheelchair and she said no she was not going to go anywhere until she was better and stronger. Aware [facility] is in place for home health services. Nursing aware will need to do some education regarding drains and tube feedings. Continue to work on discharge needs.

## 2024-08-27 NOTE — Plan of Care (Signed)
  Problem: Consults Goal: RH GENERAL PATIENT EDUCATION Description: See Patient Education module for education specifics. Outcome: Progressing Goal: Nutrition Consult-if indicated Outcome: Progressing Goal: Diabetes Guidelines if Diabetic/Glucose > 140 Description: If diabetic or lab glucose is > 140 mg/dl - Initiate Diabetes/Hyperglycemia Guidelines & Document Interventions  Outcome: Progressing   Problem: RH BOWEL ELIMINATION Goal: RH STG MANAGE BOWEL WITH ASSISTANCE Description: STG Manage Bowel with mod I Assistance. Outcome: Progressing   Problem: RH SAFETY Goal: RH STG ADHERE TO SAFETY PRECAUTIONS W/ASSISTANCE/DEVICE Description: STG Adhere to Safety Precautions With cues Assistance/Device. Outcome: Progressing   Problem: RH PAIN MANAGEMENT Goal: RH STG PAIN MANAGED AT OR BELOW PT'S PAIN GOAL Description: Pain < 4 with prns Outcome: Progressing   Problem: RH KNOWLEDGE DEFICIT GENERAL Goal: RH STG INCREASE KNOWLEDGE OF SELF CARE AFTER HOSPITALIZATION Description: Patient and spouse will be able to manage care at discharge using educational resources independently Outcome: Progressing

## 2024-08-27 NOTE — Progress Notes (Signed)
 Physical Therapy Note  Patient Details  Name: Lauren Gray MRN: [id number] Date of Birth: Jul 25, 1951 Today's Date: 08/27/2024    Pt missed 60 min skilled PT due to refusal in group setting. Will continue efforts as schedule permits.    Normalee Sistare 08/27/2024, 12:33 PM

## 2024-08-27 NOTE — Progress Notes (Signed)
 Physical Therapy Session Note  Patient Details  Name: Lauren Gray MRN: [id number] Date of Birth: [birth year]  Today's Date: 08/27/2024 PT Individual Time: 1130-1153; 1420 - 1505 PT Individual Time Calculation (min): 23 min; 45 min   Short Term Goals: Week 2:  PT Short Term Goal 1 (Week 2): STG = LTG d/t ELOS  SESSION 1 Skilled Therapeutic Interventions/Progress Updates: Patient semi-reclined in bed with husband present on entrance to room. Patient alert and agreeable to PT session.   Patient reported feeling better than previous day after having tubes placed in. Pt reported only feeling able to do bed level activity. PTA encouraged pt to have nsg assist with personal clothing prior to afternoon PT arrive due to constricted time this session. Pt performed eccentric control of SLR with PTA passively moving LE into hip flexion up to 45*. Pt required minA to control eccentric on L LE vs R. Pt performed 2 rounds with AROM on 2nd round up to available AROM. Pt then performed adduction+abduction B LE's with cues to avoid sliding heel on bed x 10. Pt then performed 90/90 B LE's with heavy modA to maintain knee in flexion 90* (pt also cued to push into PTA forearm to active hip/knee extensors. Pt required use of BSC and performed semi-reclined to sit with supervision/CGA. Pt pivoted to [facility] with minA to stand due to fatigue.   Patient sitting on BSC at end of session with brakes locked, call bell in reach, and nsg aware of pt's location to take over.  SESSION 2 Skilled Therapeutic Interventions/Progress Updates: Patient sitting in [facility] with husband present on entrance to room. Patient alert and agreeable to PT session.   Patient reported no complaints of pain. Pt transported outside of [facility] center dependently. Pt ambulated roughly 27' in RW with CGA for safety as pt had not participated in OOB therapy this session due to new site of JP tube placement the previous day and increased fatigue this morning. Pt  required VC to maintain safe proximity to RW. Pt required seated rest and transported back to room in [facility]. Pt performed sit<>stands during session with CGA for safety and stepped up to first step at stairs with B UE on railing. Pt JP tube noted to have exited pt's lower abdomen site on R. Pt transported back to room in [facility] with nsg arriving when back in room and PA arriving shortly after to take over care.   Patient sitting in WC at end of session with brakes locked, nsg, PA and husband present.        Therapy Documentation Precautions:  Precautions Precautions: Fall, Other (comment) Precaution/Restrictions Comments: JP drainx2 (L), PEG tube Restrictions Weight Bearing Restrictions Per Provider Order: No   Therapy/Group: Individual Therapy  Nicole Hafley PTA 08/27/2024, 12:48 PM

## 2024-08-27 NOTE — Plan of Care (Signed)
  Problem: Consults Goal: RH GENERAL PATIENT EDUCATION Description: See Patient Education module for education specifics. 08/27/2024 0423 by Stevens GLENWOOD Con Randine JONETTA, RN Outcome: Progressing 08/27/2024 0423 by Stevens GLENWOOD Con Randine JONETTA, RN Outcome: Progressing Goal: Nutrition Consult-if indicated 08/27/2024 0423 by Stevens GLENWOOD Con Randine JONETTA, RN Outcome: Progressing 08/27/2024 0423 by Stevens GLENWOOD Con Randine JONETTA, RN Outcome: Progressing Goal: Diabetes Guidelines if Diabetic/Glucose > 140 Description: If diabetic or lab glucose is > 140 mg/dl - Initiate Diabetes/Hyperglycemia Guidelines & Document Interventions  08/27/2024 0423 by Stevens GLENWOOD Con Randine JONETTA, RN Outcome: Progressing 08/27/2024 0423 by Stevens GLENWOOD Con Randine JONETTA, RN Outcome: Progressing   Problem: RH BOWEL ELIMINATION Goal: RH STG MANAGE BOWEL WITH ASSISTANCE Description: STG Manage Bowel with mod I Assistance. 08/27/2024 0423 by Stevens GLENWOOD Con Randine JONETTA, RN Outcome: Progressing 08/27/2024 0423 by Stevens GLENWOOD Con Randine JONETTA, RN Outcome: Progressing   Problem: RH SAFETY Goal: RH STG ADHERE TO SAFETY PRECAUTIONS W/ASSISTANCE/DEVICE Description: STG Adhere to Safety Precautions With cues Assistance/Device. 08/27/2024 0423 by Stevens GLENWOOD Con Randine JONETTA, RN Outcome: Progressing 08/27/2024 0423 by Stevens GLENWOOD Con Randine JONETTA, RN Outcome: Progressing   Problem: RH PAIN MANAGEMENT Goal: RH STG PAIN MANAGED AT OR BELOW PT'S PAIN GOAL Description: Pain < 4 with prns 08/27/2024 0423 by Stevens GLENWOOD Con Randine JONETTA, RN Outcome: Progressing 08/27/2024 0423 by Stevens GLENWOOD Con Randine JONETTA, RN Outcome: Progressing   Problem: RH KNOWLEDGE DEFICIT GENERAL Goal: RH STG INCREASE KNOWLEDGE OF SELF CARE AFTER HOSPITALIZATION Description: Patient and spouse will be able to manage care at discharge using educational resources independently 08/27/2024 0423 by Stevens GLENWOOD Con Randine JONETTA, RN Outcome: Progressing 08/27/2024 0423 by Stevens GLENWOOD Con Randine JONETTA, RN Outcome: Progressing

## 2024-08-28 DIAGNOSIS — T8143XA Infection following a procedure, organ and space surgical site, initial encounter: Secondary | ICD-10-CM

## 2024-08-28 DIAGNOSIS — K651 Peritoneal abscess: Secondary | ICD-10-CM

## 2024-08-28 LAB — GLUCOSE, CAPILLARY
Glucose-Capillary: 270 mg/dL — ABNORMAL HIGH (ref 70–99)
Glucose-Capillary: 153 mg/dL — ABNORMAL HIGH (ref 70–99)
Glucose-Capillary: 252 mg/dL — ABNORMAL HIGH (ref 70–99)

## 2024-08-28 LAB — BASIC METABOLIC PANEL WITH GFR
Anion gap: 14 (ref 5–15)
BUN: 29 mg/dL — ABNORMAL HIGH (ref 8–23)
CO2: 15 mmol/L — ABNORMAL LOW (ref 22–32)
Calcium: 8.3 mg/dL — ABNORMAL LOW (ref 8.9–10.3)
Chloride: 105 mmol/L (ref 98–111)
Creatinine, Ser: 1.32 mg/dL — ABNORMAL HIGH (ref 0.44–1.00)
GFR, Estimated: 43 mL/min — ABNORMAL LOW (ref >60)
Glucose, Bld: 269 mg/dL — ABNORMAL HIGH (ref 70–99)
Potassium: 4.4 mmol/L (ref 3.5–5.1)
Sodium: 134 mmol/L — ABNORMAL LOW (ref 135–145)

## 2024-08-28 LAB — CBC
HCT: 27.6 % — ABNORMAL LOW (ref 36.0–46.0)
Hemoglobin: 8.5 g/dL — ABNORMAL LOW (ref 12.0–15.0)
MCH: 27.5 pg (ref 26.0–34.0)
MCHC: 30.8 g/dL (ref 30.0–36.0)
MCV: 89.3 fL (ref 80.0–100.0)
Platelets: 410 K/uL — ABNORMAL HIGH (ref 150–400)
RBC: 3.09 MIL/uL — ABNORMAL LOW (ref 3.87–5.11)
RDW: 16 % — ABNORMAL HIGH (ref 11.5–15.5)
WBC: 12.3 K/uL — ABNORMAL HIGH (ref 4.0–10.5)
nRBC: 0 % (ref 0.0–0.2)

## 2024-08-28 MED ORDER — SODIUM CHLORIDE 0.9 % IV BOLUS
500.0000 mL | Freq: Once | INTRAVENOUS | Status: AC
  Administered 2024-08-28: 500 mL via INTRAVENOUS

## 2024-08-28 MED ORDER — AMOXICILLIN-POT CLAVULANATE 875-125 MG PO TABS: 1.0000 | ORAL_TABLET | Freq: Two times a day (BID) | ORAL | Status: DC

## 2024-08-28 MED ORDER — KATE FARMS STANDARD 1.4 EN LIQD
1000.0000 mL | Freq: Every day | ENTERAL | Status: DC
  Administered 2024-08-28 – 2024-08-31 (×3): 1000 mL
  Filled 2024-08-28 (×6): qty 1000

## 2024-08-28 MED ORDER — PIPERACILLIN-TAZOBACTAM 3.375 G IVPB
3.3750 g | Freq: Three times a day (TID) | INTRAVENOUS | Status: DC
Start: 2024-08-28 — End: 2024-08-31
  Administered 2024-08-28 – 2024-08-31 (×10): 3.375 g via INTRAVENOUS
  Filled 2024-08-28 (×11): qty 50

## 2024-08-28 NOTE — Progress Notes (Signed)
 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 Continue tube feeding via J tube: [employer] 1.4 at 100 ml/h x 14 h/day (from 6pm-8am) (1400 ml per day) Provides 1960 kcal, 86 gm protein, 994 ml free water  daily Relizorb TID (Rep will provide education to nursing staff on use and set up when it arrives): each cartridge covers 500 ml; 1st cartridge at 6pm, 2nd cartridge at 10pm, 3rd cartridge at 3am Assess tolerance and monitor for decreased GI discomforts on [employer] and once Relizorb is ordered/initiated Will contact Relizorb rep for help getting pt set up at discharge and for information about helping pt getting cost coverage for the product  NUTRITION DIAGNOSIS:   Moderate Malnutrition related to acute illness (s/p Whipple) as evidenced by mild fat depletion, mild muscle depletion. Remains applicable  GOAL:   Patient will meet greater than or equal to 90% of their needs Progressing  MONITOR:   PO intake, TF tolerance, Diet advancement  REASON FOR ASSESSMENT:   Consult Assessment of nutrition requirement/status, Calorie Count  ASSESSMENT:   Pt with hx of intermittent n/v for 1 month with recent admission 8/22-10/3 where gastric outlet obstruction was found, biopsy positive for adenocarcinoma and pt underwent Whipple procedure 9/9. Pt's hospital course included TPN and J tube placement 9/9. Pt's diet advanced to soft diet at  time of discharge but still receiving 100% of needs via enteral nutrition. Pt admitted to CIR for functional decline.   Spoke with pt and pt's husband at bedside. Pt continues to struggle with ongoing diarrhea each morning after tube feeds have been running. 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 Spoke with MD who approved adjustments to tube feeding. Would like to trial [employer] 1.4 formula to assess for better tolerance as well as order Relizorb. Mallie Pinion has higher effectiveness rate with Relizorb and is usually better tolerated GI than Osmolite.   Pt continues to have very poor intake with multiple days of 0% intake due to nausea, diarrhea, poor appetite, and taste changes. Pt will still need continued oncology care following Whipple surgery but with poor energy levels pt has not been able to progress in rehab and discharge has been pushed back. Pt would greatly benefit from optimizing nutrition status with tube feeds but only if tube feeds can be absorbed properly. Discussed Relizorb product with pt and husband, pt understands the benefit and is willing to try anything that will give her more energy and help her get through this time. Pt endorses extreme fatigue and was unable to complete any therapy sessions today. Pt also believes persistent diarrhea made it seem impossible to get through therapy today.  Glucose management has improved since last assessment but continues to be an issue for pt. Controlling glucose is very important for whipple recovery.   Medications: ProSource TF 20 daily Banatrol BID Free water  100 mL TID SSI 0-15 units TID  SSI 0-5 units daily Novolog  6 units TID Semglee  30 units daily Creon  TID w/ meals Megace Protonix   Labs: Sodium 134 CBG x 24 hr: 153-270 mg/dL J8r 6.1   Diet Order:   Diet Order             DIET SOFT Room  service appropriate? Yes; Fluid consistency: Thin  Diet effective now                   EDUCATION NEEDS:   Education needs have been addressed  Skin:  Skin Assessment: Reviewed RN Assessment  Last BM:  per pt multiple type 7s this morning  Height:   Ht Readings from Last 1 Encounters:  08/15/24 5' 8 (1.727 m)    Weight:   Wt Readings from Last 1 Encounters:  08/28/24 112 kg    Ideal Body Weight:  63.6 kg  BMI:  Body mass index is 37.54 kg/m.  Estimated Nutritional Needs:   Kcal:  2000-2200  Protein:  110-125g  Fluid:  >/= 2L    Josette Glance, MS, RDN, LDN Clinical Dietitian I Please reach out via secure chat or call [phone]

## 2024-08-28 NOTE — Progress Notes (Signed)
 Referring Provider(s): * No referring provider recorded for this case *  Supervising Physician: Jennefer Rover  Patient Status:  [facility] - In-pt  Chief Complaint:  Intra-abdominal abscess s/[ drain placement on 10/14.  Brief History:  Pt presented to the hospital on 8/22 with concerns for abdominal pain, vomiting, and weight loss for the past several weeks. 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 She has been recovering well and was discharged to rehab on 10/3. Unfortunately, one surgical drain became dislodged on . Repeat CT A/P from 10/13 revealed multiple intraabdominal abscesses. IR re-placed drain on 10/14. However, second Surgical drain was dislodged on 10/15. IR recommended repeat CT. Surgery service has recommended watchful waiting.  Subjective:  Patient alert and laying in bed, calm. Her sister is at the bedside. Currently without any significant complaints. She is minimally tender and bothered by her drain. She complains mostly of high volume purulent output through the incision site of her former second drain. Patient denies any fevers, headache, chest pain, SOB, cough, nausea, vomiting or bleeding.    Allergies: Codeine and Latex  Medications: Prior to Admission medications   Medication Sig Start Date End Date Taking? Authorizing Provider  amLODipine  (NORVASC ) 10 MG tablet Take 1 tablet (10 mg total) by mouth daily. 08/16/24   Danford, Lonni SQUIBB, MD  carvedilol (COREG) 6.25 MG tablet Take 2 tablets (12.5 mg total) by mouth 2 (two) times daily with a meal. 08/15/24   Danford, Lonni SQUIBB, MD  fiber supplement, BANATROL TF, liquid Place 60 mLs into feeding tube 2 (two) times daily. 08/15/24   Danford, Lonni SQUIBB, MD  gabapentin  (NEURONTIN ) 250 MG/5ML solution Place 2 mLs (100 mg total) into  feeding tube every 8 (eight) hours. 08/15/24   Danford, Lonni SQUIBB, MD  HYDROcodone-acetaminophen  (NORCO/VICODIN) 5-325 MG tablet Take 1-2 tablets by mouth every 6 (six) hours as needed for severe pain (pain score 7-10). 08/15/24   Danford, Lonni SQUIBB, MD  insulin  aspart (NOVOLOG ) 100 UNIT/ML injection Inject 0-15 Units into the skin 3 (three) times daily with meals. 08/15/24   Danford, Lonni SQUIBB, MD  insulin  glargine (LANTUS ) 100 UNIT/ML injection Inject 0.2 mLs (20 Units total) into the skin daily. 08/15/24   Danford, Lonni SQUIBB, MD  lipase/protease/amylase 24000-76000 units CPEP Take 1 capsule (24,000 Units total) by mouth 3 (three) times daily before meals. 08/15/24   Danford, Lonni SQUIBB, MD  methocarbamol  (ROBAXIN ) 1000 MG/10ML injection Inject 5 mLs (500 mg total) into the vein every 8 (eight) hours. 08/15/24   Danford, Lonni SQUIBB, MD  Morphine  Sulfate (MORPHINE , PF,) 2 MG/ML injection Inject 1 mL (2 mg total) into the vein every 2 (two) hours as needed (Intractable pain not controlled by oral medication). 08/15/24   Danford, Lonni SQUIBB, MD  Nutritional Supplements (FEEDING SUPPLEMENT, OSMOLITE 1.5 CAL,) LIQD Place 1,260 mLs into feeding tube daily. 08/16/24   Danford, Lonni SQUIBB, MD  ondansetron  (ZOFRAN ) 4 MG/2ML SOLN injection Inject 2 mLs (4 mg total) into the vein every 4 (four) hours as needed for nausea or vomiting. 08/15/24   Danford, Lonni SQUIBB, MD  pantoprazole  (PROTONIX ) 40 MG tablet Take 1 tablet (40 mg total) by mouth daily. 08/15/24   Danford, Lonni SQUIBB, MD  Protein (FEEDING SUPPLEMENT, PROSOURCE TF20,) liquid Place 60 mLs into feeding tube daily. 08/16/24   Danford, Lonni SQUIBB, MD  sodium chloride  0.9 % SOLN 50 mL with promethazine  25 MG/ML SOLN 12.5 mg Inject 12.5 mg into the vein every 6 (six) hours as needed. 08/15/24   Danford, Lonni SQUIBB, MD  traMADol  (ULTRAM ) 50 MG tablet Place 1 tablet (50 mg total) into feeding tube every 12 (twelve) hours as needed  for moderate pain (pain score 4-6). 08/15/24   Danford, Lonni SQUIBB, MD  traZODone  (DESYREL ) 50 MG tablet Place 1 tablet (50 mg total) into feeding tube at bedtime. 08/15/24   Danford, Lonni SQUIBB, MD  Water  For Irrigation, Sterile (FREE WATER ) SOLN Place 100 mLs into feeding tube every 8 (eight) hours. 08/15/24   Jonel Lonni SQUIBB, MD     Vital Signs: BP (!) 154/62 (BP Location: Right Arm)   Pulse 98   Temp 98.6 F (37 C)   Resp 17   Ht 5' 8 (1.727 m)   Wt 246 lb 14.6 oz (112 kg)   SpO2 97%   BMI 37.54 kg/m   Physical Exam Constitutional:      Appearance: Normal appearance.  Cardiovascular:     Rate and Rhythm: Normal rate.  Pulmonary:     Effort: Pulmonary effort is normal.  Abdominal:     Comments: Large dressing below IR drain covering site of prior surgical drain. Dressing appears saturated in places with purulent ouput from incision site.  RUQ IR drain exposed, without dressing.  Drain incision site  mildly tender, without evidence of infection.  Retaining suture in place.  25 mL serous output in collection bulb. Line flushes well.    Musculoskeletal:        General: Normal range of motion.  Skin:    General: Skin is warm and dry.  Neurological:     Mental Status: She is alert and oriented to person, place, and time.      Labs:  CBC: Recent Labs    08/20/24 0426 08/21/24 0506 08/25/24 0604 08/28/24 0536  WBC 6.7 6.8 7.8 12.3*  HGB 8.9* 8.4* 8.4* 8.5*  HCT 29.0* 27.3* 26.4* 27.6*  PLT 329 310 422* 410*    COAGS: Recent Labs    07/22/24 0350 07/23/24 0406 07/31/24 1137 08/26/24 1918  INR 1.0 1.2 1.2 1.2    BMP: Recent Labs    08/21/24 0506 08/25/24 0604 08/26/24 1042 08/28/24 0536  NA 135 131* 133* 134*  K 4.1 5.0 4.8 4.4  CL 105 103 103 105  CO2 20* 17* 18* 15*  GLUCOSE 290* 359* 143* 269*  BUN 24* 18 20 29*  CALCIUM  8.1* 8.1* 8.2* 8.3*  CREATININE 1.18* 0.99 1.02* 1.32*  GFRNONAA 49* >60 58* 43*    LIVER FUNCTION  TESTS: Recent Labs    08/10/24 1321 08/12/24 0052 08/14/24 0347 08/16/24 0338  BILITOT 0.6 0.6 0.7 0.5  AST 16 13* 18 13*  ALT 25 19 21 17   ALKPHOS 100 83 117 98  PROT 7.2 6.3* 6.9 6.7  ALBUMIN  1.9* 1.6* 1.7* 1.5*    Assessment and Plan:  Drain Location: RUQ Size: Fr size: 12 Fr Date of placement: 08/26/2024  Currently to: Drain collection device: suction bulb 24 hour output:  Output by Drain (mL) 08/26/24 0701 - 08/26/24 1900 08/26/24 1901 - 08/27/24 0700 08/27/24 0701 - 08/27/24 1900 08/27/24 1901 - 08/28/24 0700 08/28/24 0701 - 08/28/24 1653  Closed System Drain 2 Right Abdomen Bulb (JP) 19 Fr.   60    Closed System Drain 2 Superior Abdomen 12 Fr.  50 60    Gastrostomy/Enterostomy  Jejunostomy 18 Fr. LUQ         Interval imaging/drain manipulation:  None  Current examination: RUQ IR drain exposed, without dressing.  Drain incision site  mildly tender, without evidence of infection.  Retaining suture in place.  25 mL serous output in collection bulb.  Line flushes well.   Plan: Dr. Hughes recommends repeat drain evaluation and possible exchange in 7-10 days from placement. Please place new dressing as dressing was absent.  Dressing changes QD or PRN if soiled.  Continue TID flushes with 5 cc NS. Record output Q shift. Call IR APP or on call IR MD if difficulty flushing or sudden change in drain output.  Repeat imaging/possible drain injection once output < 10 mL/QD (excluding flush material). Consideration for drain removal if output is < 10 mL/QD (excluding flush material), pending discussion with the providing surgical service.  Discharge planning: Please contact IR APP or on call IR MD prior to patient d/c to ensure appropriate follow up plans are in place. Typically patient will follow up with IR clinic 10-14 days post d/c for repeat imaging/possible drain injection. IR scheduler will contact patient with date/time of appointment. Patient will need to flush drain  QD with 5 cc NS, record output QD, dressing changes every 2-3 days or earlier if soiled.   IR will continue to follow - please call with questions or concerns.    Thank you for this interesting consult.  I greatly enjoyed meeting Lauren Gray and look forward to participating in their care.   Electronically Signed: Carlin Lauren Griffon, PA-C 08/28/2024, 4:53 PM     I spent a total of 15 Minutes at the the patient's bedside AND on the patient's hospital floor or unit, greater than 50% of which was counseling/coordinating care for RUQ drain follow-up.

## 2024-08-28 NOTE — Progress Notes (Signed)
 PROGRESS NOTE   Subjective/Complaints:  Patient does not feel well this morning feels very tired after having a couple of BMs.  We discussed that having the BMs is a good sign that her ileus may be improving. We discussed all of her medical issues including abscess and history of duodenal cancer.  She states that she was told she does not have cancer anymore since margins were clean.  We had discussion that we are still dealing with the aftereffects and that she needs ongoing monitoring and treatment for this diagnosis.  Patient was distressed by this discussion.  Appreciate general surgery note Received message from pharmacy about positive Klebsiella in abscess cultures.  Will get general surgery input on this.  ROS: Patient denies fever, rash, sore throat, blurred vision, dizziness, nausea, vomiting, diarrhea, cough, shortness of breath or chest pain, joint or back/neck pain, headache, or mood change.     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 EXAM: CT-GUIDED RIGHT UPPER QUADRANT ABSCESS DRAINAGE CATHETER PLACEMENT COMPARISON:  CT AP, 08/25/2024. MEDICATIONS: The patient is currently admitted to the hospital and receiving intravenous antibiotics. The antibiotics were administered within an appropriate time frame prior to the initiation of the procedure. ANESTHESIA/SEDATION: Moderate (conscious) sedation was employed during this procedure. A total of Versed  1 mg and Fentanyl  100 mcg was administered intravenously. Moderate Sedation Time: 24 minutes. The patient's level of consciousness and vital signs were monitored continuously by radiology nursing throughout the procedure under my direct supervision. CONTRAST:  None FLUOROSCOPY TIME:  CT dose; 1422  mGycm COMPLICATIONS: None immediate. PROCEDURE: RADIATION DOSE REDUCTION: This exam was performed according to the departmental dose-optimization program which includes automated exposure control, adjustment of the mA and/or kV according to patient size and/or use of iterative reconstruction technique. Informed written consent was obtained from the patient and/or patient's representative after a discussion of the risks, benefits and alternatives to treatment. The patient was placed supine on the CT gantry and a pre procedural CT was performed re-demonstrating the known abscess/fluid collection within the RIGHT upper quadrant. The procedure was planned. A timeout was performed prior to the initiation of the procedure. The RIGHT upper quadrant was prepped and draped in the usual sterile fashion. The overlying soft tissues were anesthetized with 1% lidocaine  with epinephrine. Appropriate trajectory was planned with the use of a 22 gauge spinal needle. An 18 gauge trocar needle was advanced into the abscess/fluid collection and a short Amplatz super stiff wire was coiled within the collection. Appropriate positioning was confirmed with a limited CT scan. The tract was serially dilated allowing placement of a 12 Fr drainage catheter. Appropriate positioning was confirmed with a limited postprocedural CT scan. 5 mL of purulent serosanguineous fluid was aspirated. The tube was connected to bulb suction and sutured in place. A dressing was placed. The patient tolerated the procedure well without immediate post procedural complication. IMPRESSION: Successful CT-guided placement of a 12 Fr drainage catheter into the location with aspiration of 5 mL of purulent serosanguineous fluid. Samples were sent to the laboratory as  requested by the ordering clinical team. RECOMMENDATIONS: The patient will return to Vascular Interventional Radiology (VIR) for routine drainage catheter evaluation and exchange in 7-10 days. Thom Hall, MD  [facility] Radiology Electronically Signed   By: Thom Hall M.D.   On: 08/26/2024 16:17      Recent Labs    08/28/24 0536  WBC 12.3*  HGB 8.5*  HCT 27.6*  PLT 410*    Recent Labs    08/26/24 1042 08/28/24 0536  NA 133* 134*  K 4.8 4.4  CL 103 105  CO2 18* 15*  GLUCOSE 143* 269*  BUN 20 29*  CREATININE 1.02* 1.32*  CALCIUM  8.2* 8.3*     Intake/Output Summary (Last 24 hours) at 08/28/2024 0937 Last data filed at 08/28/2024 0835 Gross per 24 hour  Intake 640 ml  Output 120 ml  Net 520 ml        Physical Exam: Vital Signs Blood pressure (!) 122/45, pulse 85, temperature 97.7 F (36.5 C), resp. rate 18, height 5' 8 (1.727 m), weight 112 kg, SpO2 99%.   General: No acute distress Mood and affect are appropriate Heart: Regular rate and rhythm no rubs murmurs or extra sounds Lungs: Clear to auscultation, breathing unlabored, no rales or wheezes Abdomen: Positive bowel sounds, soft nontender to palpation, nondistended Extremities: No clubbing, cyanosis, or edema Skin: No evidence of breakdown, no evidence of rash   Skin:  Midline incision cas below Neuro: AAOx4. No apparent cognitive deficits    Sensory exam: revealed normal sensation in all dermatomal regions in bilateral upper extremities and bilateral lower extremities Motor exam: strength 5-/5 throughout bilateral upper extremities and bilateral lower extremities Coordination: Fine motor coordination was normal.        Assessment/Plan: 1. Functional deficits which require 3+ hours per day of interdisciplinary therapy in a comprehensive inpatient rehab setting. Physiatrist is providing close team supervision and 24 hour management of active medical problems listed below. Physiatrist and rehab team continue to assess barriers to discharge/monitor patient progress toward functional and medical goals  Care Tool:  Bathing    Body parts bathed by  patient: Right arm, Left arm, Chest, Abdomen, Front perineal area, Buttocks, Right upper leg, Left upper leg, Face     Body parts n/a: Right lower leg, Left lower leg   Bathing assist Assist Level: Minimal Assistance - Patient > 75%     Upper Body Dressing/Undressing Upper body dressing   What is the patient wearing?: Pull over shirt    Upper body assist Assist Level: Set up assist    Lower Body Dressing/Undressing Lower body dressing      What is the patient wearing?: Pants     Lower body assist Assist for lower body dressing: Moderate Assistance - Patient 50 - 74%     Toileting Toileting    Toileting assist Assist for toileting: Total Assistance - Patient < 25%     Transfers Chair/bed transfer  Transfers assist  Chair/bed transfer activity did not occur: Safety/medical concerns (2/2 high fatigue)  Chair/bed transfer assist level: Supervision/Verbal cueing     Locomotion Ambulation   Ambulation assist   Ambulation activity did not occur: Safety/medical concerns (2/2 high fatigue)  Assist level: Contact Guard/Touching assist Assistive device: Walker-rolling Max distance: 75   Walk 10 feet activity   Assist  Walk 10 feet activity did not occur: Safety/medical concerns (2/2 high fatigue)  Assist level: Contact Guard/Touching assist Assistive device: Walker-rolling   Walk 50 feet  activity   Assist Walk 50 feet with 2 turns activity did not occur: Safety/medical concerns (2/2 high fatigue)  Assist level: Contact Guard/Touching assist Assistive device: Walker-rolling    Walk 150 feet activity   Assist Walk 150 feet activity did not occur: Safety/medical concerns         Walk 10 feet on uneven surface  activity   Assist Walk 10 feet on uneven surfaces activity did not occur: Safety/medical concerns (2/2 high fatigue)         Wheelchair     Assist Is the patient using a wheelchair?: Yes Type of Wheelchair: Manual Wheelchair  activity did not occur: Safety/medical concerns (2/2 high fatigue)         Wheelchair 50 feet with 2 turns activity    Assist    Wheelchair 50 feet with 2 turns activity did not occur: Safety/medical concerns (2/2 high fatigue)       Wheelchair 150 feet activity     Assist  Wheelchair 150 feet activity did not occur: Safety/medical concerns (2/2 high fatigue)       Blood pressure (!) 122/45, pulse 85, temperature 97.7 F (36.5 C), resp. rate 18, height 5' 8 (1.727 m), weight 112 kg, SpO2 99%.   Medical Problem List and Plan: 1. 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  Antithrombotics: -DVT/anticoagulation:  Pharmaceutical: Lovenox              -antiplatelet therapy: N/A 3. Pain Management: Tylenol  prn.  .    Pain improving --uses tramadol  on occasion 4. Mood/Behavior/Sleep: LCSW to follow for evaluation and support.              -antipsychotic agents: N/A 5. Neuropsych/cognition: This patient is capable of making decisions on her own behalf. 6. Skin/Wound Care: Monitor wound for healing.  Still with 2 lines as well as J-tube some tenderness around the J-tube but no drainage   7. Fluids/Electrolytes/Nutrition: Monitor I/O. Continue tube feeds    Latest Ref Rng & Units 08/28/2024    5:36 AM 08/26/2024   10:42 AM 08/25/2024    6:04 AM  BMP  Glucose 70 - 99 mg/dL 730  856  640   BUN 8 - 23 mg/dL 29  20  18    Creatinine 0.44 - 1.00 mg/dL 8.67  8.97  9.00   Sodium 135 - 145 mmol/L 134  133  131   Potassium 3.5 - 5.1 mmol/L 4.4  4.8  5.0   Chloride 98 - 111 mmol/L 105  103  103   CO2 22 - 32 mmol/L 15  18  17    Calcium  8.9 - 10.3 mg/dL 8.3  8.2  8.1     Mild increase in creatinine, increase free water  8. Duodenal adenocarcinoma w/gastric outlet obst:   continues on tube feed for nutritional support.  Also postop ileus.  At this point not tolerating p.o., n.p.o. On tube feeds for nutrition  9. Abdominal distension:  improve motility lass stomach gas, some mild ileus noted per Gen surg  only ~50mg  tramadol  per day     Chest pain resolved off of p.o. feeds 10. Fluid overload/Pleural effusions: In part due to third spacing. S/p thoracocentesis.  Right side improved ,  left side    11. HTN: Monitor BP TID--on amlodipine , catapres  and coreg              - Add hydralazine  25 mg Q8H PRN for HTN >170 SBP or >100 DBP, given recent highs   10/4-10/5 BP running low 140s- con't regimen Vitals:   08/27/24 1937 08/28/24 0441  BP: (!) 113/52 (!) 122/45  Pulse: 84 85  Resp: 18 18  Temp: 97.9 F (36.6 C) 97.7 F (36.5 C)  SpO2: 98% 99%  Controlled 10/15 12.  Prediabetes: Hgb A1c- 6.1. Elevated BS due to tube feeds             --continue Lantus  with meal coverage and SSI             --monitor BS ac/hs and use SSI for elevated BS   CBG (last 3)  Recent Labs    08/27/24 1659 08/27/24 2107 08/28/24 0632  GLUCAP 167* 202* 270*  Increase lantus  10/12 to 20U, minimal po intake , no change in TF since 10/9 Am CBG still elevated increased Lantus  to 22U BID 10/14, daytime CBGs are okay a.m. is the main issue.  Increase nighttime dose of Lantus .  Off daytime dose 13. Acute on chronic kidney disease: SCr 1.3 at admission and now WNL 14.  Obesity  Class 2: BMI 38.47 15.  ABLA: Recheck CBC in am. Hgb 8-9 range.   Stable 10/13    Latest Ref Rng & Units 08/28/2024    5:36 AM 08/25/2024    6:04 AM 08/21/2024    5:06 AM  CBC  WBC 4.0 - 10.5 K/uL 12.3  7.8  6.8   Hemoglobin 12.0 - 15.0 g/dL 8.5  8.4  8.4   Hematocrit 36.0 - 46.0 % 27.6  26.4  27.3   Platelets 150 - 400 K/uL 410  422  310     16. Severe protein malnutrition- J tube placed 9/9 Albumin  of 1.5- is declining-    17. Thrush  Resolved after nystatin  and diflucan   Ask dietary to follow oral  vs enteral intake  18.  Leukocytosis without fever does have abscess growing out Klebsiella, defer to general surgery in terms of antibiotic decisions    LOS: 13 days A FACE TO FACE EVALUATION WAS PERFORMED  Prentice FORBES Compton 08/28/2024, 9:37 AM

## 2024-08-28 NOTE — Progress Notes (Signed)
 Physical Therapy Session Note  Patient Details  Name: Lauren Gray MRN: [id number] Date of Birth: [birth year]  Today's Date: 08/28/2024 PT Individual Time: 0810 - 0845 PT Individual Time Calculation (min): 35 min  PT Individual Time: 8992-8974 PT Individual Time Calculation (min): 18 min   Short Term Goals: Week 2:  PT Short Term Goal 1 (Week 2): STG = LTG d/t ELOS  SESSION 1 Skilled Therapeutic Interventions/Progress Updates: Patient sitting EOB following personal care with nsg on entrance to room. Patient alert. Pt required min/heavy minA to stand from EOB while nsg assisted with donning brief with VC for hand placement to push to stand. Pt sitting EOB following and expressed sadness for CLOF. PTA provided active listening and encouragement to pt's progress, not only physically, but mentally as well as pt has pushed self throughout PT session despite feeling tired or weak, and that it is a significant difference since evaluation. Pt modA to donn personal shirt due to time. PTA threaded personal pants with pt sitting EOB. Pt stood min/modA due to fatigue and attempted to donn personal pants up to waist but required further assistance to donn posterior side. Pt transferred to [facility] with RW and close supervision/CGA.   Patient sitting in WC at end of session with brakes locked, and all needs within reach.  SESSION 2 Skilled Therapeutic Interventions/Progress Updates: Patient sitting in [facility] with husband present on entrance to room. Patient alert and reported not feeling any better since this morning. Pt fixated on conversation with attending physician about her cancer (MD communicated that cancer has been removed during first session) and wanted to get to the bottom of this. PTA provided active listening and offered to communicate with appropriate channels to further clarify, but pt stated that attending surgery physician will be in to talk with pt later in day. Pt not willing to participate in  therapy despite best efforts and encouragement to continue with progression. Pt still adamant on not participating in therapy.  Patient sitting in WC at end of session with brakes locked, husband present, and all needs within reach.       Therapy Documentation Precautions:  Precautions Precautions: Fall, Other (comment) Precaution/Restrictions Comments: JP drainx2 (L), PEG tube Restrictions Weight Bearing Restrictions Per Provider Order: No  Therapy/Group: Individual Therapy  Helio Lack PTA 08/28/2024, 10:48 AM

## 2024-08-28 NOTE — Progress Notes (Signed)
 Occupational Therapy Session Note  Patient Details  Name: Lauren Gray MRN: [id number] Date of Birth: 01/10/1951  Today's Date: 08/28/2024 OT Missed Time: 45 Minutes Missed Time Reason: Patient fatigue;Pain   Short Term Goals: Week 1:  OT Short Term Goal 1 (Week 1): The pt safely transfer to all surface with MinA using AE as needed. OT Short Term Goal 1 - Progress (Week 1): Met OT Short Term Goal 2 (Week 1): The pt will bathe/dress LB safely with MinA using AE as needed.. OT Short Term Goal 2 - Progress (Week 1): Progressing toward goal OT Short Term Goal 3 (Week 1): The pt will safely participate in > 30 minutes of Occupational Therapy  activity with minimal rest breaks following demonstration and initial cues. OT Short Term Goal 3 - Progress (Week 1): Progressing toward goal OT Short Term Goal 4 (Week 1): The pt will safely demonstrate a simple homemaking task at [facility] after demonstration and initial cues. OT Short Term Goal 4 - Progress (Week 1): Progressing toward goal Week 2:  OT Short Term Goal 1 (Week 2): Pt willl be able to stand and manage clothing over hips with min A pre and post toileting. OT Short Term Goal 2 (Week 2): Pt will be able to don pants over feet using reacher with min A. OT Short Term Goal 3 (Week 2): Pt will be able to walk in and out of the bathroom with CGA and no symptoms of shortness of breath to demonstrate improved activity tolerance.  Skilled Therapeutic Interventions/Progress Updates:    Pt received in w/c. Pt was not feeling well at all and MD and her surgeon are aware of her status.  Pt declined participation in all therapies today, but encouraged her to not assume she will not feel well enough for afternoon session.  Hopefully she will be able to participate.   Therapy Documentation Precautions:  Precautions Precautions: Fall, Other (comment) Precaution/Restrictions Comments: JP drainx2 (L), PEG tube Restrictions Weight Bearing Restrictions Per  Provider Order: No     Therapy/Group: Individual Therapy  Kaytelynn Scripter 08/28/2024, 12:04 PM

## 2024-08-28 NOTE — Progress Notes (Signed)
   Subjective/Chief Complaint:  Pt feeling worse this AM. Feeling very drained and fatigued.    WBCs up.  Second drain came out yesterday.  Klebsiella grew from the perc drain.     Objective: Vital signs in last 24 hours: Temp:  [97.4 F (36.3 C)-97.9 F (36.6 C)] 97.7 F (36.5 C) (10/16 0441) Pulse Rate:  [83-85] 85 (10/16 0441) Resp:  [18-20] 18 (10/16 0441) BP: (113-122)/(45-52) 122/45 (10/16 0441) SpO2:  [98 %-100 %] 99 % (10/16 0441) Weight:  [887 kg] 112 kg (10/16 0500) Last BM Date : 08/28/24  Intake/Output from previous day: 10/15 0701 - 10/16 0700 In: 400 [P.O.:100; NG/GT:300] Out: 120 [Drains:120] Intake/Output this shift: Total I/O In: 240 [P.O.:240] Out: -   PE:  Constitutional: looks very fatigued in wheelchair.    Eyes: Anicteric sclerae Lungs: normal respiratory effort GI: Soft, non-tender to palpation, IR drain with cloudy colorless output.  Psychiatric: appropriate judgment and insight, oriented to person, place, and time   Lab Results:  Recent Labs    08/28/24 0536  WBC 12.3*  HGB 8.5*  HCT 27.6*  PLT 410*   BMET Recent Labs    08/26/24 1042 08/28/24 0536  NA 133* 134*  K 4.8 4.4  CL 103 105  CO2 18* 15*  GLUCOSE 143* 269*  BUN 20 29*  CREATININE 1.02* 1.32*  CALCIUM  8.2* 8.3*  Anti-infectives: Anti-infectives (From admission, onward)    Start     Dose/Rate Route Frequency Ordered Stop   08/28/24 1130  amoxicillin-clavulanate (AUGMENTIN) 875-125 MG per tablet 1 tablet        1 tablet Oral Every 12 hours 08/28/24 1033 09/07/24 0759   08/18/24 0800  fluconazole  (DIFLUCAN ) tablet 200 mg  Status:  Discontinued        200 mg Oral Daily 08/17/24 0819 08/25/24 0958   08/17/24 1000  fluconazole  (DIFLUCAN ) tablet 400 mg        400 mg Oral  Once 08/17/24 0819 08/17/24 1002       Assessment/Plan: s/p Whipple,  jejunostomy tube Dr. 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  Margins negative.  18 h tube feed cycle for full calorie intake; currently decreased to 14 hours  Calorie counts.  Megace for appetite stimulant.  Decreased tube feeds 10/9 as above.  IR drain in place.  No surgical drains remaining.  Klebsiella grew from drain perc drain. 48 h zosyn , then transition to augmentin.   Anticipate continued drainage from skin hole from drain.  Recheck labs in AM. Will plan CT at latest Sunday and will get earlier if febrile since second drain came out.   AKI- 500 mL bolus NS.    Dispo - per PM&R. Tentative d/c planned 10/21.     LOS: 13 days    Jina LITTIE Aron, MD, FACS, FSSO Surgical Oncology, General Surgery, Trauma and Critical [facility] Surgery, [place] [phone] for weekday/non holidays Check [email or web address] for coverage night/weekend/holidays

## 2024-08-28 NOTE — Progress Notes (Signed)
 Occupational Therapy Session Note  Patient Details  Name: Lauren Gray MRN: [id number] Date of Birth: [birth year]  Today's Date: 08/28/2024 OT Individual Time: 1330-1340 OT Individual Time Calculation (min): 10 min  and Today's Date: 08/28/2024 OT Missed Time: 50 Minutes Missed Time Reason: Patient fatigue;Patient unwilling/refused to participate without medical reason   Short Term Goals: Week 2:  OT Short Term Goal 1 (Week 2): Pt willl be able to stand and manage clothing over hips with min A pre and post toileting. OT Short Term Goal 2 (Week 2): Pt will be able to don pants over feet using reacher with min A. OT Short Term Goal 3 (Week 2): Pt will be able to walk in and out of the bathroom with CGA and no symptoms of shortness of breath to demonstrate improved activity tolerance.  Skilled Therapeutic Interventions/Progress Updates:    Pt received resting in bed with family present. Provided education about POC and d/c plan while providing MAX therapeutic support. Pt was left resting in bed with call bell in reach, family present, and all needs met.  Missed 50 min d/t Pt declining all other therapeutic activities. Will attempt to make up time as schedule allows.   Therapy Documentation Precautions:  Precautions Precautions: Fall, Other (comment) Precaution/Restrictions Comments: JP drainx2 (L), PEG tube Restrictions Weight Bearing Restrictions Per Provider Order: No General: General OT Amount of Missed Time: 45 Minutes PT Missed Treatment Reason: Patient unwilling to participate  Therapy/Group: Individual Therapy  [name] 08/28/2024, 2:40 PM

## 2024-08-28 NOTE — Plan of Care (Signed)
  Problem: Consults Goal: RH GENERAL PATIENT EDUCATION Description: See Patient Education module for education specifics. Outcome: Progressing Goal: Nutrition Consult-if indicated Outcome: Progressing Goal: Diabetes Guidelines if Diabetic/Glucose > 140 Description: If diabetic or lab glucose is > 140 mg/dl - Initiate Diabetes/Hyperglycemia Guidelines & Document Interventions  Outcome: Progressing   Problem: RH BOWEL ELIMINATION Goal: RH STG MANAGE BOWEL WITH ASSISTANCE Description: STG Manage Bowel with mod I Assistance. Outcome: Progressing   Problem: RH SAFETY Goal: RH STG ADHERE TO SAFETY PRECAUTIONS W/ASSISTANCE/DEVICE Description: STG Adhere to Safety Precautions With cues Assistance/Device. Outcome: Progressing   Problem: RH PAIN MANAGEMENT Goal: RH STG PAIN MANAGED AT OR BELOW PT'S PAIN GOAL Description: Pain < 4 with prns Outcome: Progressing   Problem: RH KNOWLEDGE DEFICIT GENERAL Goal: RH STG INCREASE KNOWLEDGE OF SELF CARE AFTER HOSPITALIZATION Description: Patient and spouse will be able to manage care at discharge using educational resources independently Outcome: Progressing

## 2024-08-29 ENCOUNTER — Inpatient Hospital Stay (HOSPITAL_COMMUNITY)

## 2024-08-29 LAB — BASIC METABOLIC PANEL WITH GFR
Anion gap: 14 (ref 5–15)
BUN: 22 mg/dL (ref 8–23)
CO2: 12 mmol/L — ABNORMAL LOW (ref 22–32)
Calcium: 7.9 mg/dL — ABNORMAL LOW (ref 8.9–10.3)
Chloride: 108 mmol/L (ref 98–111)
Creatinine, Ser: 1.12 mg/dL — ABNORMAL HIGH (ref 0.44–1.00)
GFR, Estimated: 52 mL/min — ABNORMAL LOW (ref >60)
Glucose, Bld: 289 mg/dL — ABNORMAL HIGH (ref 70–99)
Potassium: 5.7 mmol/L — ABNORMAL HIGH (ref 3.5–5.1)
Sodium: 134 mmol/L — ABNORMAL LOW (ref 135–145)

## 2024-08-29 LAB — GLUCOSE, CAPILLARY
Glucose-Capillary: 162 mg/dL — ABNORMAL HIGH (ref 70–99)
Glucose-Capillary: 194 mg/dL — ABNORMAL HIGH (ref 70–99)
Glucose-Capillary: 206 mg/dL — ABNORMAL HIGH (ref 70–99)
Glucose-Capillary: 227 mg/dL — ABNORMAL HIGH (ref 70–99)
Glucose-Capillary: 302 mg/dL — ABNORMAL HIGH (ref 70–99)
Glucose-Capillary: 74 mg/dL (ref 70–99)
Glucose-Capillary: 92 mg/dL (ref 70–99)
Glucose-Capillary: 94 mg/dL (ref 70–99)
Glucose-Capillary: 99 mg/dL (ref 70–99)

## 2024-08-29 LAB — CBC
HCT: 32 % — ABNORMAL LOW (ref 36.0–46.0)
Hemoglobin: 9.7 g/dL — ABNORMAL LOW (ref 12.0–15.0)
MCH: 26.9 pg (ref 26.0–34.0)
MCHC: 30.3 g/dL (ref 30.0–36.0)
MCV: 88.9 fL (ref 80.0–100.0)
Platelets: 377 K/uL (ref 150–400)
RBC: 3.6 MIL/uL — ABNORMAL LOW (ref 3.87–5.11)
RDW: 16.3 % — ABNORMAL HIGH (ref 11.5–15.5)
WBC: 7.4 K/uL (ref 4.0–10.5)
nRBC: 0 % (ref 0.0–0.2)

## 2024-08-29 LAB — HEPATIC FUNCTION PANEL
ALT: 19 U/L (ref 0–44)
AST: 13 U/L — ABNORMAL LOW (ref 15–41)
Albumin: <1.5 g/dL — ABNORMAL LOW (ref 3.5–5.0)
Alkaline Phosphatase: 147 U/L — ABNORMAL HIGH (ref 38–126)
Bilirubin, Direct: 0.1 mg/dL (ref 0.0–0.2)
Indirect Bilirubin: 0.4 mg/dL (ref 0.3–0.9)
Total Bilirubin: 0.5 mg/dL (ref 0.0–1.2)
Total Protein: 6.7 g/dL (ref 6.5–8.1)

## 2024-08-29 MED ORDER — LOPERAMIDE HCL 2 MG PO CAPS
2.0000 mg | ORAL_CAPSULE | Freq: Two times a day (BID) | ORAL | Status: AC
  Administered 2024-08-29 (×2): 2 mg via ORAL
  Filled 2024-08-29 (×2): qty 1

## 2024-08-29 MED ORDER — SODIUM ZIRCONIUM CYCLOSILICATE 10 G PO PACK
10.0000 g | PACK | Freq: Once | ORAL | Status: AC
  Administered 2024-08-29: 10 g via ORAL
  Filled 2024-08-29: qty 1

## 2024-08-29 MED ORDER — INSULIN GLARGINE-YFGN 100 UNIT/ML ~~LOC~~ SOLN
35.0000 [IU] | Freq: Every day | SUBCUTANEOUS | Status: DC
  Administered 2024-08-29 – 2024-09-03 (×6): 35 [IU] via SUBCUTANEOUS
  Filled 2024-08-29 (×7): qty 0.35

## 2024-08-29 MED ORDER — IOHEXOL 350 MG/ML SOLN
75.0000 mL | Freq: Once | INTRAVENOUS | Status: AC | PRN
  Administered 2024-08-29: 75 mL via INTRAVENOUS

## 2024-08-29 NOTE — Progress Notes (Signed)
 Pt breakfast tray delivered to room and set up assistance provided. After taking a few bites pt is noted to begin vomiting which can be described as liquid in consistency and green in color with a foul odor. Pt provided ADL care to include mouth care and gown and bedding change. Pt requested and provided ice chips.  Report provided to on coming nurse.

## 2024-08-29 NOTE — Progress Notes (Signed)
 Patient ID: Lauren Gray, female   DOB: 02/01/1951, 73 y.o.   MRN: [id number]  Have ordered rolling walker and drop-arm bedside commode from [employer], pt and husband are aware of this.

## 2024-08-29 NOTE — Progress Notes (Signed)
 Occupational Therapy Note  Patient Details  Name: Lauren Gray MRN: [id number] Date of Birth: [birth year]    Therapy and medical team recommend 15/7 schedule due to pt's limited ability to participate in a standard 3 hr a day schedule.  Pt limited by fatigue, pain, general malaise.    Lauren Gray 08/29/2024, 9:47 AM

## 2024-08-29 NOTE — Progress Notes (Signed)
 Physical Therapy Session Note  Patient Details  Name: Lauren Gray MRN: [id number] Date of Birth: Aug 20, 1951  Today's Date: 08/29/2024  Short Term Goals: Week 2:  PT Short Term Goal 1 (Week 2): STG = LTG d/t ELOS  SESSION 1 Pt missed 60 min of skilled therapy due to pt feeling unwell following emetic and diarrhea event earlier in morning. Will re-attempt as schedule and pt availability permits.  SESSION 2 Pt missed 75 min of skilled therapy due to same feelings of unwellness and fatigue, but better than this morning. Pt politely declined PT and waiting on physician to arrive to discuss imaging results. Will re-attempt as schedule and pt availability permits.     Therapy Documentation Precautions:  Precautions Precautions: Fall, Other (comment) Precaution/Restrictions Comments: JP drainx2 (L), PEG tube Restrictions Weight Bearing Restrictions Per Provider Order: No   Therapy/Group: Individual Therapy  Sumayah Bearse PTA 08/29/2024, 9:06 AM

## 2024-08-29 NOTE — Progress Notes (Signed)
 Endorsing grabbing sensation mid-upper abdominal area. Slight improvement with repositioning of patient.

## 2024-08-29 NOTE — Progress Notes (Signed)
 Dressings changed where old JP drain was and around G-Tube. G-Tube dressing gauze underneath having a moderate amount of thick yellow/purulent drainage. G-Tube tubing 9 cm. Drainage similar to drainage where old JP drain was.

## 2024-08-29 NOTE — Progress Notes (Signed)
 PROGRESS NOTE    Subjective/Complaints:  According to husband and patient vomited after first bite of food this morning.  Vomited onto plate because it came very quickly.  Has some sweats feels like the room is very warm, thermostat on 73.  IV antibiotic started yesterday per general surgery.  Mild abdominal pain.  ROS: Patient denies fever, rash, sore throat, blurred vision, dizziness, nausea, vomiting, diarrhea, cough, shortness of breath or chest pain, joint or back/neck pain, headache, or mood change.     Objective:   No results found.     Recent Labs    08/28/24 0536 08/29/24 0622  WBC 12.3* 7.4  HGB 8.5* 9.7*  HCT 27.6* 32.0*  PLT 410* 377    Recent Labs    08/28/24 0536 08/29/24 0622  NA 134* 134*  K 4.4 5.7*  CL 105 108  CO2 15* 12*  GLUCOSE 269* 289*  BUN 29* 22  CREATININE 1.32* 1.12*  CALCIUM  8.3* 7.9*     Intake/Output Summary (Last 24 hours) at 08/29/2024 0934 Last data filed at 08/29/2024 0800 Gross per 24 hour  Intake 370 ml  Output 20 ml  Net 350 ml        Physical Exam: Vital Signs Blood pressure (!) 150/94, pulse 98, temperature 99.8 F (37.7 C), temperature source Oral, resp. rate (!) 28, height 5' 8 (1.727 m), weight 112 kg, SpO2 100%.   General: No acute distress Mood and affect are appropriate Heart: Regular rate and rhythm no rubs murmurs or extra sounds Lungs: Clear to auscultation, breathing unlabored, no rales or wheezes Abdomen: Positive bowel sounds, soft nontender to palpation, nondistended Extremities: No clubbing, cyanosis, or edema Skin: No evidence of breakdown, no evidence of rash   Skin:  Midline incision cas below Neuro: AAOx4. No apparent cognitive deficits    Sensory exam: revealed normal sensation in all dermatomal regions in bilateral upper extremities and bilateral lower extremities Motor exam: strength 5-/5 throughout bilateral upper extremities  and bilateral lower extremities Coordination: Fine motor coordination was normal.        Assessment/Plan: 1. Functional deficits which require 3+ hours per day of interdisciplinary therapy in a comprehensive inpatient rehab setting. Physiatrist is providing close team supervision and 24 hour management of active medical problems listed below. Physiatrist and rehab team continue to assess barriers to discharge/monitor patient progress toward functional and medical goals  Care Tool:  Bathing    Body parts bathed by patient: Right arm, Left arm, Chest, Abdomen, Front perineal area, Buttocks, Right upper leg, Left upper leg, Face     Body parts n/a: Right lower leg, Left lower leg   Bathing assist Assist Level: Minimal Assistance - Patient > 75%     Upper Body Dressing/Undressing Upper body dressing   What is the patient wearing?: Pull over shirt    Upper body assist Assist Level: Set up assist    Lower Body Dressing/Undressing Lower body dressing      What is the patient wearing?: Pants     Lower body assist Assist for lower body dressing: Moderate Assistance - Patient 50 - 74%     Toileting Toileting    Toileting assist Assist  for toileting: Total Assistance - Patient < 25%     Transfers Chair/bed transfer  Transfers assist  Chair/bed transfer activity did not occur: Safety/medical concerns (2/2 high fatigue)  Chair/bed transfer assist level: Supervision/Verbal cueing     Locomotion Ambulation   Ambulation assist   Ambulation activity did not occur: Safety/medical concerns (2/2 high fatigue)  Assist level: Contact Guard/Touching assist Assistive device: Walker-rolling Max distance: 75   Walk 10 feet activity   Assist  Walk 10 feet activity did not occur: Safety/medical concerns (2/2 high fatigue)  Assist level: Contact Guard/Touching assist Assistive device: Walker-rolling   Walk 50 feet activity   Assist Walk 50 feet with 2 turns activity  did not occur: Safety/medical concerns (2/2 high fatigue)  Assist level: Contact Guard/Touching assist Assistive device: Walker-rolling    Walk 150 feet activity   Assist Walk 150 feet activity did not occur: Safety/medical concerns         Walk 10 feet on uneven surface  activity   Assist Walk 10 feet on uneven surfaces activity did not occur: Safety/medical concerns (2/2 high fatigue)         Wheelchair     Assist Is the patient using a wheelchair?: Yes Type of Wheelchair: Manual Wheelchair activity did not occur: Safety/medical concerns (2/2 high fatigue)         Wheelchair 50 feet with 2 turns activity    Assist    Wheelchair 50 feet with 2 turns activity did not occur: Safety/medical concerns (2/2 high fatigue)       Wheelchair 150 feet activity     Assist  Wheelchair 150 feet activity did not occur: Safety/medical concerns (2/2 high fatigue)       Blood pressure (!) 150/94, pulse 98, temperature 99.8 F (37.7 C), temperature source Oral, resp. rate (!) 28, height 5' 8 (1.727 m), weight 112 kg, SpO2 100%.   Medical Problem List and Plan: 1. 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  Antithrombotics: -DVT/anticoagulation:  Pharmaceutical: Lovenox              -antiplatelet therapy: N/A 3. Pain Management: Tylenol  prn.  .    Pain improving --uses tramadol  on occasion 4. Mood/Behavior/Sleep: LCSW to follow for evaluation and support.              -antipsychotic agents: N/A 5. Neuropsych/cognition: This patient is capable of making decisions on her own behalf. 6. Skin/Wound Care: Monitor wound for healing.  Still with 2 lines as well as J-tube some tenderness around the J-tube but no drainage   7. Fluids/Electrolytes/Nutrition:  Monitor I/O. Continue tube feeds    Latest Ref Rng & Units 08/29/2024    6:22 AM 08/28/2024    5:36 AM 08/26/2024   10:42 AM  BMP  Glucose 70 - 99 mg/dL 710  730  856   BUN 8 - 23 mg/dL 22  29  20    Creatinine 0.44 - 1.00 mg/dL 8.87  8.67  8.97   Sodium 135 - 145 mmol/L 134  134  133   Potassium 3.5 - 5.1 mmol/L 5.7  4.4  4.8   Chloride 98 - 111 mmol/L 108  105  103   CO2 22 -  32 mmol/L 12  15  18    Calcium  8.9 - 10.3 mg/dL 7.9  8.3  8.2     Mild increase in creatinine, increase free water  8. Duodenal adenocarcinoma w/gastric outlet obst:  continues on tube feed for nutritional support.  Also postop ileus.  At this point not tolerating p.o., will make n.p.o. until cleared by general surgery On tube feeds for nutrition  9. Abdominal distension:  improve motility lass stomach gas, some mild ileus noted per Gen surg  only ~50mg  tramadol  per day     Chest pain resolved off of p.o. feeds 10. Fluid overload/Pleural effusions: In part due to third spacing. S/p thoracocentesis.  Right side improved , left side    11. HTN: Monitor BP TID--on amlodipine , catapres  and coreg          Mild elevation monitor on current meds avoid diuretics due to hydration status Vitals:   08/29/24 0730 08/29/24 0759  BP: (!) 150/94   Pulse: 98   Resp: (!) 31 (!) 28  Temp: 99.8 F (37.7 C) 99.8 F (37.7 C)  SpO2: 100%    12.  Prediabetes: Hgb A1c- 6.1. Elevated BS due to tube feeds             --continue Lantus  with meal coverage and SSI             --monitor BS ac/hs and use SSI for elevated BS   CBG (last 3)  Recent Labs    08/29/24 0004 08/29/24 0618 08/29/24 0802  GLUCAP 162* 302* 227*  Increase lantus  10/12 to 20U, minimal po intake , no change in TF since 10/9 Of change Lantus  to 35 units nightly  13. Acute on chronic kidney disease: SCr 1.3 at admission and now WNL 14.  Obesity  Class 2: BMI 38.47 15.  ABLA: Recheck CBC in am. Hgb 8-9 range.   Stable 10/17    Latest Ref Rng & Units  08/29/2024    6:22 AM 08/28/2024    5:36 AM 08/25/2024    6:04 AM  CBC  WBC 4.0 - 10.5 K/uL 7.4  12.3  7.8   Hemoglobin 12.0 - 15.0 g/dL 9.7  8.5  8.4   Hematocrit 36.0 - 46.0 % 32.0  27.6  26.4   Platelets 150 - 400 K/uL 377  410  422     16. Severe protein malnutrition- J tube placed 9/9 Albumin  of 1.5- is declining-    17. Thrush  Resolved after nystatin  and diflucan   Ask dietary to follow oral vs enteral intake  18.  Leukocytosis resolved after initiation of IV Zosyn , continue antibiotic therapy per general surgery  LOS: 14 days A FACE TO FACE EVALUATION WAS PERFORMED  Prentice FORBES Compton 08/29/2024, 9:34 AM

## 2024-08-29 NOTE — Plan of Care (Signed)
  Problem: Consults Goal: RH GENERAL PATIENT EDUCATION Description: See Patient Education module for education specifics. Outcome: Progressing Goal: Nutrition Consult-if indicated Outcome: Progressing Goal: Diabetes Guidelines if Diabetic/Glucose > 140 Description: If diabetic or lab glucose is > 140 mg/dl - Initiate Diabetes/Hyperglycemia Guidelines & Document Interventions  Outcome: Progressing   Problem: RH BOWEL ELIMINATION Goal: RH STG MANAGE BOWEL WITH ASSISTANCE Description: STG Manage Bowel with mod I Assistance. Outcome: Progressing   Problem: RH SAFETY Goal: RH STG ADHERE TO SAFETY PRECAUTIONS W/ASSISTANCE/DEVICE Description: STG Adhere to Safety Precautions With cues Assistance/Device. Outcome: Progressing   Problem: RH PAIN MANAGEMENT Goal: RH STG PAIN MANAGED AT OR BELOW PT'S PAIN GOAL Description: Pain < 4 with prns Outcome: Progressing   Problem: RH KNOWLEDGE DEFICIT GENERAL Goal: RH STG INCREASE KNOWLEDGE OF SELF CARE AFTER HOSPITALIZATION Description: Patient and spouse will be able to manage care at discharge using educational resources independently Outcome: Progressing

## 2024-08-29 NOTE — Progress Notes (Signed)
 Occupational Therapy Session Note  Patient Details  Name: Lauren Gray MRN: [id number] Date of Birth: 12-Apr-1951  Today's Date: 08/29/2024 OT Individual Time: 9154-9084 OT Individual Time Calculation (min): 30 min  and Today's Date: 08/29/2024 OT Missed Time: 45 Minutes Missed Time Reason: Patient fatigue;Patient ill (comment);Unavailable (comment) (first 15 min MD visit, last 30 min felt fatigued and tired)   Short Term Goals: Week 2:  OT Short Term Goal 1 (Week 2): Pt willl be able to stand and manage clothing over hips with min A pre and post toileting. OT Short Term Goal 2 (Week 2): Pt will be able to don pants over feet using reacher with min A. OT Short Term Goal 3 (Week 2): Pt will be able to walk in and out of the bathroom with CGA and no symptoms of shortness of breath to demonstrate improved activity tolerance.  Skilled Therapeutic Interventions/Progress Updates:    MD in with pt for the first 15 min of the session,  MD left room stating pt requested to use the bathroom.  Upon entering room, pt stated I went awhile ago and it is all over me.  Thin liquid bowel of yellow color had leaked all over her and the bed.  NT arrived to assist me, and then her surgeon arrived to tell pt she will be going to a CT scan later today.  Pt cued to bend her knees and push with her feet to roll and to reach with her arms but even with her effort, pt needed mod -max A to fully roll R and L for thorough cleansing.  Changed bed pad, brief, gown.  Pt stating the movement was exhausting.   After clean up, asked her to engage in some light arm exercise.   Pt stated please let me rest a minute, I am worn out.  Pt fell asleep quite quickly. Pt allowed to rest as she had a difficulty morning with vomiting, diarrhea, nauseous ness.   Pt's spouse in hallway and I told him she was now sleeping.   Therapy Documentation Precautions:  Precautions Precautions: Fall, Other (comment) Precaution/Restrictions  Comments: JP drainx2 (L), PEG tube Restrictions Weight Bearing Restrictions Per Provider Order: No General: General OT Amount of Missed Time: 45 Minutes Vital Signs: Therapy Vitals Temp: 99.8 F (37.7 C) Temp Source: Oral Pulse Rate: 98 Resp: (!) 28 BP: (!) 150/94 Patient Position (if appropriate): Lying Oxygen Therapy SpO2: 100 % O2 Device: Room Air Pain: Pain Assessment Pain Scale: 0-10 Pain Score: 4  Pain Location: Abdomen - RN aware    Therapy/Group: Individual Therapy  Cassidy Tashiro 08/29/2024, 9:38 AM

## 2024-08-29 NOTE — Progress Notes (Signed)
   08/29/24 0730  Assess: MEWS Score  Temp 99.8 F (37.7 C)  BP (!) 150/94  MAP (mmHg) 78  Pulse Rate 98  Resp (!) 31  Level of Consciousness Alert  SpO2 100 %  O2 Device Room Air  Assess: MEWS Score  MEWS Temp 0  MEWS Systolic 0  MEWS Pulse 0  MEWS RR 2  MEWS LOC 0  MEWS Score 2  MEWS Score Color Yellow  Assess: if the MEWS score is Yellow or Red  Were vital signs accurate and taken at a resting state? Yes  Does the patient meet 2 or more of the SIRS criteria? Yes  Does the patient have a confirmed or suspected source of infection? Yes  MEWS guidelines implemented  Yes, yellow  Treat  MEWS Interventions Considered administering scheduled or prn medications/treatments as ordered  Take Vital Signs  Increase Vital Sign Frequency  Yellow: Q2hr x1, continue Q4hrs until patient remains green for 12hrs  Escalate  MEWS: Escalate Yellow: Discuss with charge nurse and consider notifying provider and/or RRT  Notify: Charge Nurse/RN  Name of Charge Nurse/RN Janese Blackbird, RN  Provider Notification  Provider Name/Title Kirsteins  Date Provider Notified 08/29/24  Time Provider Notified 0730  Method of Notification Rounds  Notification Reason Other (Comment) (Yellow MEWS)  Provider response In department;No new orders  Date of Provider Response 08/29/24  Time of Provider Response 0810  Assess: SIRS CRITERIA  SIRS Temperature  0  SIRS Respirations  1  SIRS Pulse 1  SIRS WBC 0  SIRS Score Sum  2

## 2024-08-29 NOTE — Progress Notes (Signed)
   Subjective/Chief Complaint:  Pt had some emesis.  Also now having diarrhea again.   Objective: Vital signs in last 24 hours: Temp:  [98.5 F (36.9 C)-99.8 F (37.7 C)] 98.5 F (36.9 C) (10/17 1048) Pulse Rate:  [92-99] 93 (10/17 1048) Resp:  [17-32] 32 (10/17 1048) BP: (131-155)/(58-94) 152/60 (10/17 1048) SpO2:  [97 %-100 %] 100 % (10/17 1048) Last BM Date : 08/28/24  Intake/Output from previous day: 10/16 0701 - 10/17 0700 In: 550 [P.O.:240; I.V.:10; NG/GT:300] Out: -  Intake/Output this shift: Total I/O In: 160 [P.O.:100; Other:60] Out: 20 [Drains:20]  PE:  Constitutional: getting cleaned up from large loose stool  Eyes: Anicteric sclerae Lungs: normal respiratory effort GI: Soft, non-tender to palpation, IR drain with cloudy colorless output. Output down.  Psychiatric: appropriate judgment and insight, oriented to person, place, and time   Lab Results:  Recent Labs    08/28/24 0536 08/29/24 0622  WBC 12.3* 7.4  HGB 8.5* 9.7*  HCT 27.6* 32.0*  PLT 410* 377   BMET Recent Labs    08/28/24 0536 08/29/24 0622  NA 134* 134*  K 4.4 5.7*  CL 105 108  CO2 15* 12*  GLUCOSE 269* 289*  BUN 29* 22  CREATININE 1.32* 1.12*  CALCIUM  8.3* 7.9*  Anti-infectives: Anti-infectives (From admission, onward)    Start     Dose/Rate Route Frequency Ordered Stop   08/30/24 0800  amoxicillin-clavulanate (AUGMENTIN) 875-125 MG per tablet 1 tablet  Status:  Discontinued        1 tablet Oral Every 12 hours 08/28/24 1102 08/29/24 1258   08/28/24 1400  piperacillin -tazobactam (ZOSYN ) IVPB 3.375 g        3.375 g 12.5 mL/hr over 240 Minutes Intravenous Every 8 hours 08/28/24 1102 09/06/24 1359   08/28/24 1130  amoxicillin-clavulanate (AUGMENTIN) 875-125 MG per tablet 1 tablet  Status:  Discontinued        1 tablet Oral Every 12 hours 08/28/24 1033 08/28/24 1057   08/18/24 0800  fluconazole  (DIFLUCAN ) tablet 200 mg  Status:  Discontinued        200 mg Oral Daily 08/17/24  0819 08/25/24 0958   08/17/24 1000  fluconazole  (DIFLUCAN ) tablet 400 mg        400 mg Oral  Once 08/17/24 0819 08/17/24 1002       Assessment/Plan: s/p Whipple,  jejunostomy tube Dr. 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  Margins negative.  18 h tube feed cycle for full calorie intake; currently decreased to 14 hours  Megace for appetite stimulant.   IR drain in place.  No surgical drains remaining.  Klebsiella grew from drain perc drain.  Pseudomonas now growing also.  Continue zosyn , don't switch over to augmentin.  D/w pharmacist.   Anticipate continued drainage from skin hole from drain.   CT today.     Dispo - per PM&R. Tentative d/c planned 10/21.     LOS: 14 days    Jina LITTIE Aron, MD, FACS, FSSO Surgical Oncology, General Surgery, Trauma and Critical [facility] Surgery, [place] [phone] for weekday/non holidays Check [email or web address] for coverage night/weekend/holidays

## 2024-08-29 NOTE — Progress Notes (Signed)
 Given PRN antinausea medication this AM per patient request. Given antipyretic this AM as well. Is Yellow MEWS with increase in respirations. Denies feeling anxious. Intermittent moments of delay with responses at times. Yellow bile leaking out of G-Tube and overflowing into the tubing while administering medication this morning. Right arm is tremulous. Reports sensation of being cold. Tachypnea this AM slight improvement from 31 RPM to 28 RPM. Charge Nurse and Provider made aware & informed of patients' change in condition.

## 2024-08-30 LAB — BASIC METABOLIC PANEL WITH GFR
Anion gap: 11 (ref 5–15)
BUN: 19 mg/dL (ref 8–23)
CO2: 17 mmol/L — ABNORMAL LOW (ref 22–32)
Calcium: 7.9 mg/dL — ABNORMAL LOW (ref 8.9–10.3)
Chloride: 108 mmol/L (ref 98–111)
Creatinine, Ser: 1.26 mg/dL — ABNORMAL HIGH (ref 0.44–1.00)
GFR, Estimated: 45 mL/min — ABNORMAL LOW (ref >60)
Glucose, Bld: 283 mg/dL — ABNORMAL HIGH (ref 70–99)
Potassium: 4.1 mmol/L (ref 3.5–5.1)
Sodium: 136 mmol/L (ref 135–145)

## 2024-08-30 LAB — GLUCOSE, CAPILLARY
Glucose-Capillary: 276 mg/dL — ABNORMAL HIGH (ref 70–99)
Glucose-Capillary: 194 mg/dL — ABNORMAL HIGH (ref 70–99)
Glucose-Capillary: 128 mg/dL — ABNORMAL HIGH (ref 70–99)
Glucose-Capillary: 137 mg/dL — ABNORMAL HIGH (ref 70–99)

## 2024-08-30 NOTE — Progress Notes (Signed)
 Occupational Therapy Note  Patient Details  Name: Lauren Gray MRN: [id number] Date of Birth: [birth year]  Today's Date: 08/30/2024 OT Missed Time: 60 Minutes Missed Time Reason: Patient fatigue (sleeping)  Pt soundly sleeping upon arrival. Unable to arouse pt sufficiently to participate in therapy. Pt missed 60 mins skilled OT services. Will attempt to see pt again as schedule allows.   Maritza Ned [facility] 08/30/2024, 7:17 AM

## 2024-08-30 NOTE — Progress Notes (Signed)
   Subjective/Chief Complaint:  Up in the chair. Mild nausea which she attributes to pills on an empty stomach. Denies abd pain. Couple of BMs yesterday  Objective: Vital signs in last 24 hours: Temp:  [98.4 F (36.9 C)-100.4 F (38 C)] 98.4 F (36.9 C) (10/18 0906) Pulse Rate:  [79-93] 88 (10/18 0908) Resp:  [22-32] 24 (10/18 0906) BP: (125-155)/(51-65) 139/55 (10/18 0908) SpO2:  [95 %-100 %] 95 % (10/18 0906) Weight:  [887 kg] 112 kg (10/18 0500) Last BM Date : 08/29/24  Intake/Output from previous day: 10/17 0701 - 10/18 0700 In: 615 [P.O.:200; I.V.:5; NG/GT:300] Out: 47.5 [Drains:47.5] Intake/Output this shift: No intake/output data recorded.  PE:  Constitutional: getting cleaned up from large loose stool  Eyes: Anicteric sclerae Lungs: normal respiratory effort GI: Soft, overall non-tender to palpation, IR drain with cloudy colorless output. Output down (47 mL), J tube clamped with scant bilious drainage around it  Psychiatric: appropriate judgment and insight, oriented to person, place, and time   Lab Results:  Recent Labs    08/28/24 0536 08/29/24 0622  WBC 12.3* 7.4  HGB 8.5* 9.7*  HCT 27.6* 32.0*  PLT 410* 377   BMET Recent Labs    08/29/24 0622 08/30/24 0512  NA 134* 136  K 5.7* 4.1  CL 108 108  CO2 12* 17*  GLUCOSE 289* 283*  BUN 22 19  CREATININE 1.12* 1.26*  CALCIUM  7.9* 7.9*  Anti-infectives: Anti-infectives (From admission, onward)    Start     Dose/Rate Route Frequency Ordered Stop   08/30/24 0800  amoxicillin-clavulanate (AUGMENTIN) 875-125 MG per tablet 1 tablet  Status:  Discontinued        1 tablet Oral Every 12 hours 08/28/24 1102 08/29/24 1258   08/28/24 1400  piperacillin -tazobactam (ZOSYN ) IVPB 3.375 g        3.375 g 12.5 mL/hr over 240 Minutes Intravenous Every 8 hours 08/28/24 1102 09/06/24 1359   08/28/24 1130  amoxicillin-clavulanate (AUGMENTIN) 875-125 MG per tablet 1 tablet  Status:  Discontinued        1 tablet Oral  Every 12 hours 08/28/24 1033 08/28/24 1057   08/18/24 0800  fluconazole  (DIFLUCAN ) tablet 200 mg  Status:  Discontinued        200 mg Oral Daily 08/17/24 0819 08/25/24 0958   08/17/24 1000  fluconazole  (DIFLUCAN ) tablet 400 mg        400 mg Oral  Once 08/17/24 0819 08/17/24 1002       Assessment/Plan: s/p Whipple,  jejunostomy tube Dr. 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  Margins negative.  18 h tube feed cycle for full calorie intake; currently decreased to 14 hours  Megace for appetite stimulant.  Resume carb mod diet as tolerated.   IR drain in place.  No surgical drains remaining.  Klebsiella grew from drain perc drain.  Pseudomonas now growing also.  Continue zosyn , don't switch over to augmentin.  CT repeated 10/17 w/ fluid collection near distal pancreas - not drainable per IR unfortunately. Continue IV abx. Anticipate continued drainage from skin hole from drain.     Dispo - per PM&R. Tentative d/c planned 10/21.     LOS: 15 days   Lauren Gray, [facility] Surgery Please see Amion for pager number during day hours 7:00am-4:30pm

## 2024-08-30 NOTE — Progress Notes (Signed)
 Physical Therapy Session Note  Patient Details  Name: Lauren Gray MRN: [id number] Date of Birth: 1951-09-17  Today's Date: 08/30/2024 PT Individual Time: 0925-0935 PT Individual Time Calculation (min): 10 min   Short Term Goals: Week 2:  PT Short Term Goal 1 (Week 2): STG = LTG d/t ELOS  Skilled Therapeutic Interventions/Progress Updates: Patient sitting on [employer] with nsg and sister present on entrance to room. Patient alert and agreeable to PT session.   Patient reported fatigue and frustration with CLOF and physical presentation and feeling like she is getting weaker again. PTA provided active listening. PTA discussed with pt about recommendation to extend stay past set d/c date of 10/21 due to previous statement. Pt upset but provided with encouragement and education on need to go home safely with overall medical and functional mobility presentation. Pt reported decreased ability to perform therapy due to fatigue but wanted to ambulate to sit in recliner.   - Pt performed sit<stand from [facility] with min/modA to RW due to fatigue and weakness as pt has had to miss therapy the past few days from medical presentation. Pt with CGA and nsg following with IV pole around bed to sit in recliner. Pt cued to maintain safe proximity to RW.   Patient sitting in recliner at end of session with brakes locked, sister and nsg present, and all needs within reach.      Therapy Documentation Precautions:  Precautions Precautions: Fall, Other (comment) Precaution/Restrictions Comments: JP drainx2 (L), PEG tube Restrictions Weight Bearing Restrictions Per Provider Order: No  Therapy/Group: Individual Therapy  Ethne Jeon PTA 08/30/2024, 12:16 PM

## 2024-08-30 NOTE — Progress Notes (Signed)
 PROGRESS NOTE    Subjective/Complaints:  Doesn't have much of an appetite. + nausea.  Mild abdominal pain.   ROS: Patient denies fever, rash, sore throat, blurred vision, dizziness,  vomiting, diarrhea, cough, shortness of breath or chest pain, joint or back/neck pain, headache, or mood change.     Objective:   CT ABDOMEN PELVIS W CONTRAST Result Date: 08/29/2024 EXAM: CT ABDOMEN AND PELVIS WITH CONTRAST 08/29/2024 10:08:00 AM TECHNIQUE: CT of the abdomen and pelvis was performed with the administration of 75 mL of iohexol  (OMNIPAQUE ) 350 MG/ML injection. Multiplanar reformatted images are provided for review. Automated exposure control, iterative reconstruction, and/or weight-based adjustment of the mA/kV was utilized to reduce the radiation dose to as low as reasonably achievable. COMPARISON: CT of the abdomen and pelvis dated 08/26/2024. CLINICAL HISTORY: Abdominal pain, post-op; abscess, undrained abscess. FINDINGS: LOWER CHEST: Dependent atelectasis within the lower lobes bilaterally and a mild left-sided pleural effusion, similar to the prior exam. LIVER: The liver is unremarkable. GALLBLADDER AND BILE DUCTS: Status post cholecystectomy. No biliary ductal dilatation. SPLEEN: No acute abnormality. PANCREAS: Status post Whipple procedure. A right-sided straight catheter has been removed and replaced with a pigtail drainage catheter. The pigtail is situated within a complex fluid and gas collection near the neck of the pancreas. The collection has decreased in size since the previous study. There continues to be a fluid collection along the mid to distal body of the pancreas measuring approximately 4.4 x 2.8 x 7.9 cm. The choledochal jejunal and gastrojejunal anastomosis are unremarkable. ADRENAL GLANDS: No acute abnormality. KIDNEYS, URETERS AND BLADDER: No stones in the kidneys or ureters. No hydronephrosis. No perinephric or periureteral  stranding. Urinary bladder is unremarkable. GI AND BOWEL: Stomach demonstrates no acute abnormality. A jejunostomy catheter remains in place. There is no bowel obstruction. PERITONEUM AND RETROPERITONEUM: There is small volume of free fluid within the pelvis. No free air. VASCULATURE: Abdominal aorta is normal in caliber and demonstrates moderate calcific atheromatous disease. LYMPH NODES: No lymphadenopathy. REPRODUCTIVE ORGANS: Status post hysterectomy and bilateral salpingo-oophorectomy. BONES AND SOFT TISSUES: No acute osseous abnormality. There is a small periumbilical fat-containing hernia. IMPRESSION: 1. Complex fluid and gas collection near the neck of the pancreas with pigtail drainage catheter in place, decreased in size since the previous study. 2. Fluid collection along the mid to distal body of the pancreas measuring approximately 4.4 x 2.8 x 7.9 cm. 3. Status post Whipple procedure with unremarkable choledochol jejunal and gastrojejunal anastomosis. Jejunostomy catheter remains in place. 4. Small periumbilical fat-containing hernia. 5. Small volume of free fluid within the pelvis. Electronically signed by: Evalene Coho MD 08/29/2024 10:27 AM EDT RP Workstation: [id number]       Recent Labs    08/28/24 0536 08/29/24 0622  WBC 12.3* 7.4  HGB 8.5* 9.7*  HCT 27.6* 32.0*  PLT 410* 377    Recent Labs    08/29/24 0622 08/30/24 0512  NA 134* 136  K 5.7* 4.1  CL 108 108  CO2 12* 17*  GLUCOSE 289* 283*  BUN 22 19  CREATININE 1.12* 1.26*  CALCIUM  7.9* 7.9*     Intake/Output Summary (Last 24 hours) at 08/30/2024 1702 Last data filed  at 08/30/2024 1555 Gross per 24 hour  Intake 756.2 ml  Output 35 ml  Net 721.2 ml        Physical Exam: Vital Signs Blood pressure (!) 129/59, pulse 89, temperature 98.8 F (37.1 C), temperature source Oral, resp. rate (!) 22, height 5' 8 (1.727 m), weight 112 kg, SpO2 99%.   Constitutional: No distress . Vital signs reviewed. HEENT:  NCAT, EOMI, oral membranes moist Neck: supple Cardiovascular: RRR without murmur. No JVD    Respiratory/Chest: CTA Bilaterally without wheezes or rales. Normal effort    GI/Abdomen: BS +, sl tender. J tube clamped. JP drain with small amount of milky drainage. Ext: no clubbing, cyanosis, or edema Psych: pleasant and cooperative  Skin:  Midline incision cas below Neuro: AAOx4. No apparent cognitive deficits    Sensory exam: revealed normal sensation in all dermatomal regions in bilateral upper extremities and bilateral lower extremities Motor exam: strength 5-/5 throughout bilateral upper extremities and bilateral lower extremities Coordination: Fine motor coordination was normal.        Assessment/Plan: 1. Functional deficits which require 3+ hours per day of interdisciplinary therapy in a comprehensive inpatient rehab setting. Physiatrist is providing close team supervision and 24 hour management of active medical problems listed below. Physiatrist and rehab team continue to assess barriers to discharge/monitor patient progress toward functional and medical goals  Care Tool:  Bathing    Body parts bathed by patient: Right arm, Left arm, Chest, Abdomen, Front perineal area, Buttocks, Right upper leg, Left upper leg, Face     Body parts n/a: Right lower leg, Left lower leg   Bathing assist Assist Level: Minimal Assistance - Patient > 75%     Upper Body Dressing/Undressing Upper body dressing   What is the patient wearing?: Pull over shirt    Upper body assist Assist Level: Set up assist    Lower Body Dressing/Undressing Lower body dressing      What is the patient wearing?: Pants     Lower body assist Assist for lower body dressing: Moderate Assistance - Patient 50 - 74%     Toileting Toileting    Toileting assist Assist for toileting: Total Assistance - Patient < 25%     Transfers Chair/bed transfer  Transfers assist  Chair/bed transfer activity did not  occur: Safety/medical concerns (2/2 high fatigue)  Chair/bed transfer assist level: Supervision/Verbal cueing     Locomotion Ambulation   Ambulation assist   Ambulation activity did not occur: Safety/medical concerns (2/2 high fatigue)  Assist level: Contact Guard/Touching assist Assistive device: Walker-rolling Max distance: 75   Walk 10 feet activity   Assist  Walk 10 feet activity did not occur: Safety/medical concerns (2/2 high fatigue)  Assist level: Contact Guard/Touching assist Assistive device: Walker-rolling   Walk 50 feet activity   Assist Walk 50 feet with 2 turns activity did not occur: Safety/medical concerns (2/2 high fatigue)  Assist level: Contact Guard/Touching assist Assistive device: Walker-rolling    Walk 150 feet activity   Assist Walk 150 feet activity did not occur: Safety/medical concerns         Walk 10 feet on uneven surface  activity   Assist Walk 10 feet on uneven surfaces activity did not occur: Safety/medical concerns (2/2 high fatigue)         Wheelchair     Assist Is the patient using a wheelchair?: Yes Type of Wheelchair: Manual Wheelchair activity did not occur: Safety/medical concerns (2/2 high fatigue)  Wheelchair 50 feet with 2 turns activity    Assist    Wheelchair 50 feet with 2 turns activity did not occur: Safety/medical concerns (2/2 high fatigue)       Wheelchair 150 feet activity     Assist  Wheelchair 150 feet activity did not occur: Safety/medical concerns (2/2 high fatigue)       Blood pressure (!) 129/59, pulse 89, temperature 98.8 F (37.1 C), temperature source Oral, resp. rate (!) 22, height 5' 8 (1.727 m), weight 112 kg, SpO2 99%.   Medical Problem List and Plan: 1. 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 Interdisciplinary team conference today to discuss goals, barriers to discharge, and dc planning.  2.  Antithrombotics: -DVT/anticoagulation:  Pharmaceutical: Lovenox              -antiplatelet therapy: N/A 3. Pain Management: Tylenol  prn.  .    Pain improving --uses tramadol  on occasion 4. Mood/Behavior/Sleep: LCSW to follow for evaluation and support.              -antipsychotic agents: N/A 5. Neuropsych/cognition: This patient is capable of making decisions on her own behalf. 6. Skin/Wound Care: Monitor wound for healing.  Still with 2 lines as well as J-tube some tenderness around the J-tube but no drainage  -can't drain fluid collection at distal pancreas per IR 7. Fluids/Electrolytes/Nutrition: Monitor I/O. Continue tube feeds    Latest Ref Rng & Units 08/30/2024    5:12 AM 08/29/2024    6:22 AM 08/28/2024    5:36 AM  BMP  Glucose 70 - 99 mg/dL 716  710  730   BUN 8 - 23 mg/dL 19  22  29    Creatinine 0.44 - 1.00 mg/dL 8.73  8.87  8.67   Sodium 135 - 145 mmol/L 136  134  134   Potassium 3.5 - 5.1 mmol/L 4.1  5.7  4.4   Chloride 98 - 111 mmol/L 108  108  105   CO2 22 - 32 mmol/L 17  12  15    Calcium  8.9 - 10.3 mg/dL 7.9  7.9  8.3     89/81 Cr near baseline--no changes 8. Duodenal adenocarcinoma w/gastric outlet obst:  continues on tube feed for nutritional support.  Also postop ileus.  At this point not tolerating p.o., will make n.p.o. until cleared by general surgery On tube feeds for nutrition   -10/18--TF for 14 hours, cleared to resume CM diet by surgery    -monitor po intake 9. Abdominal distension:  improve motility lass stomach gas, some mild ileus noted per Gen surg  only ~50mg  tramadol  per day     Chest pain resolved off of p.o. feeds 10. Fluid overload/Pleural effusions: In part due to third spacing. S/p thoracocentesis.  Right side improved , left side    11. HTN: Monitor BP TID--on amlodipine , catapres  and  coreg          Mild elevation monitor on current meds avoid diuretics due to hydration status Vitals:   08/30/24 0908 08/30/24 1401  BP: (!) 139/55 (!) 129/59  Pulse: 88 89  Resp:  (!) 22  Temp:  98.8 F (37.1 C)  SpO2:  99%   12.  Prediabetes:  Hgb A1c- 6.1. Elevated BS due to tube feeds             --continue Lantus  with meal coverage and SSI             --monitor BS ac/hs and use SSI for elevated BS   CBG (last 3)  Recent Labs    08/30/24 0621 08/30/24 1125 08/30/24 1632  GLUCAP 276* 194* 128*  Increase lantus  10/12 to 20U, minimal po intake , no change in TF since 10/9 Of change Lantus  to 35 units nightly -10/18 likely will need to make changes but with diet resumed will observe for pattern   13. Acute on chronic kidney disease: SCr 1.3 at admission and now WNL 14.  Obesity  Class 2: BMI 38.47 15.  ABLA: Recheck CBC in am. Hgb 8-9 range.   Stable 10/17    Latest Ref Rng & Units 08/29/2024    6:22 AM 08/28/2024    5:36 AM 08/25/2024    6:04 AM  CBC  WBC 4.0 - 10.5 K/uL 7.4  12.3  7.8   Hemoglobin 12.0 - 15.0 g/dL 9.7  8.5  8.4   Hematocrit 36.0 - 46.0 % 32.0  27.6  26.4   Platelets 150 - 400 K/uL 377  410  422     16. Severe protein malnutrition- J tube placed 9/9 Albumin  of 1.5- is declining-    17. Thrush  Resolved after nystatin  and diflucan   Ask dietary to follow oral vs enteral intake  18.  Leukocytosis resolved after initiation of IV Zosyn  10/18 -continue zosyn  per general surgery  LOS: 15 days A FACE TO FACE EVALUATION WAS PERFORMED  Arthea ONEIDA Gunther 08/30/2024, 5:02 PM

## 2024-08-30 NOTE — Plan of Care (Signed)
  Problem: Consults Goal: RH GENERAL PATIENT EDUCATION Description: See Patient Education module for education specifics. Outcome: Progressing   Problem: RH BOWEL ELIMINATION Goal: RH STG MANAGE BOWEL WITH ASSISTANCE Description: STG Manage Bowel with mod I Assistance. Outcome: Progressing   Problem: RH SAFETY Goal: RH STG ADHERE TO SAFETY PRECAUTIONS W/ASSISTANCE/DEVICE Description: STG Adhere to Safety Precautions With cues Assistance/Device. Outcome: Progressing

## 2024-08-30 NOTE — Progress Notes (Signed)
 Occupational Therapy Session Note  Patient Details  Name: Lauren Gray MRN: [id number] Date of Birth: 06/04/1951  Today's Date: 08/30/2024 OT Individual Time: 1120-1202 OT Individual Time Calculation (min): 42 min    Short Term Goals: Week 2:  OT Short Term Goal 1 (Week 2): Pt willl be able to stand and manage clothing over hips with min A pre and post toileting. OT Short Term Goal 2 (Week 2): Pt will be able to don pants over feet using reacher with min A. OT Short Term Goal 3 (Week 2): Pt will be able to walk in and out of the bathroom with CGA and no symptoms of shortness of breath to demonstrate improved activity tolerance.  Skilled Therapeutic Interventions/Progress Updates:  Pt greeted sitting in recliner, visitor present, skilled OT session with focus on BADL retraining and functional transfers.   Pain: Pt with un-rated pain at tube sites. OT offering intermediate rest breaks and positioning suggestions throughout session to address pain/fatigue and maximize participation/safety in session.   Functional Transfers: Sit<>stands with Min A, short room-level ambulation with CGA-Min A (to maintain RW close).   Self Care Tasks: Pt completes the following self care tasks with levels of assistance noted below, UB: Setup A for anterior UB, OT assisting with washing back.  LB: OT assists with bathing of distal LE(s) as JP tube site required care from LPN due to leaking therefore reaching not attempted.   Pt remained sitting in WC with 4Ps assessed and immediate needs met. Pt continues to be appropriate for skilled OT intervention to promote further functional independence in ADLs/IADLs.   Therapy Documentation Precautions:  Precautions Precautions: Fall, Other (comment) Precaution/Restrictions Comments: JP drainx2 (L), PEG tube Restrictions Weight Bearing Restrictions Per Provider Order: No   Therapy/Group: Individual Therapy  Nereida Habermann, OTR/L, MSOT  08/30/2024, 6:59  AM

## 2024-08-30 NOTE — Progress Notes (Signed)
 Oral care performed. Suction and suction toothbrush. Yellowing/plaque on tongue area.

## 2024-08-30 NOTE — Progress Notes (Addendum)
 Closed system drain 2 superior while attending to ADLS observed green/black purulent drainage. Reports pain to the incision site. As well as area above incision site. Area above incision site is tender. Area cleansed. Additional securement devices order and reattach catheter site to securement device when able to. Provider & Charge Nurse made aware & informed.

## 2024-08-30 NOTE — Progress Notes (Signed)
 IR asked to evaluated 08/29/24 CT for new drain placement. Imaging reviewed by Dr. Karalee and there is no safe window for percutaneous placement. No IR procedure planned.    Linsay Vogt, AGACNP-BC 08/30/2024, 12:54 PM

## 2024-08-30 NOTE — Progress Notes (Addendum)
 Patient feeding tube disconnected this morning. Running through the night from 1800 to 0800. Having 30 mL of water  this AM with medication due to NPO. Once order changed patient agreeable to taking medication. Asked about medication if possible to be rescheduled for after breakfast. Endorsed concerns that taking medications without eating breakfast may be causing herself to have emesis episodes in the early AM hours. Dressings changed this morning. Continues to have moderate thick yellow drainage from the jejunostomy tubing . Minimal thick yellow drainage from recently removed JP drain site. Mid-line incision area cleansed and dressing changed. Superior JP drain catheter flushed. Intake and output recorded.

## 2024-08-31 LAB — AEROBIC/ANAEROBIC CULTURE W GRAM STAIN (SURGICAL/DEEP WOUND): Gram Stain: NONE SEEN

## 2024-08-31 LAB — GLUCOSE, CAPILLARY
Glucose-Capillary: 224 mg/dL — ABNORMAL HIGH (ref 70–99)
Glucose-Capillary: 159 mg/dL — ABNORMAL HIGH (ref 70–99)
Glucose-Capillary: 120 mg/dL — ABNORMAL HIGH (ref 70–99)
Glucose-Capillary: 192 mg/dL — ABNORMAL HIGH (ref 70–99)

## 2024-08-31 MED ORDER — SODIUM CHLORIDE 0.9 % IV SOLN
2.0000 g | Freq: Three times a day (TID) | INTRAVENOUS | Status: DC
  Administered 2024-08-31 – 2024-09-02 (×5): 2 g via INTRAVENOUS
  Filled 2024-08-31 (×7): qty 40

## 2024-08-31 NOTE — Progress Notes (Signed)
 Pharmacy Antibiotic Note  [name] is a 73 y.o. female admitted to inpatient rehab on 08/15/2024 with abdominal abscess.  Patient initially started on Zosyn .  10/14 abscess cx abundant kleb pneumo and few pseudomonas.  Pseudomonas sensitivities back today resistant to Zosyn  (result suppressed, called lab to confirm).  Communicated with Dr. Babs and Dr. Paola.  Will change antibiotics to Merrem.  Plan: Start Merrem 2gm IV q8h Follow-up renal function, clinical progress Follow-up length of therapy  Tiari Andringa, Pharm.D., BCPS Clinical Pharmacist  **Pharmacist phone directory can be found on [email or web address] listed under [facility].  08/31/2024 2:38 PM

## 2024-08-31 NOTE — Plan of Care (Signed)
  Problem: RH SAFETY Goal: RH STG ADHERE TO SAFETY PRECAUTIONS W/ASSISTANCE/DEVICE Description: STG Adhere to Safety Precautions With cues Assistance/Device. Outcome: Progressing   Problem: RH PAIN MANAGEMENT Goal: RH STG PAIN MANAGED AT OR BELOW PT'S PAIN GOAL Description: Pain < 4 with prns Outcome: Progressing   Problem: RH KNOWLEDGE DEFICIT GENERAL Goal: RH STG INCREASE KNOWLEDGE OF SELF CARE AFTER HOSPITALIZATION Description: Patient and spouse will be able to manage care at discharge using educational resources independently Outcome: Not Progressing

## 2024-08-31 NOTE — Progress Notes (Signed)
 Physical Therapy Session Note  Patient Details  Name: Lauren Gray MRN: [id number] Date of Birth: 1951-01-20  Today's Date: 08/31/2024 PT Individual Time: 1130-1200 PT Individual Time Calculation (min): 30 min   Short Term Goals: Week 1:  PT Short Term Goal 1 (Week 1): Pt will complete bed mobility using CGA PT Short Term Goal 1 - Progress (Week 1): Progressing toward goal PT Short Term Goal 2 (Week 1): Pt will complete SPT to chair/WC using CGA + LRAD PT Short Term Goal 2 - Progress (Week 1): Met PT Short Term Goal 3 (Week 1): Pt will amb 30ft using CGA + LRAD PT Short Term Goal 3 - Progress (Week 1): Met PT Short Term Goal 4 (Week 1): Pt will initiate stair training PT Short Term Goal 4 - Progress (Week 1): Met Week 2:  PT Short Term Goal 1 (Week 2): STG = LTG d/t ELOS  Skilled Therapeutic Interventions/Progress Updates:    Pt presents in recliner. Agreeable to in room therapies only due to her reports of having diarheea this AM. Engaged in seated UE/LE therex for functional strengthening and endurance. Completed with 2.5# ankle weights heel raises, toe raises, LAQ with 5 sec hold and marches x 2 sets of 10 reps. Isometric hip adduction squeezes with 5 sec hold x 10 reps. Attempted sit > stands but noted gown to be wet from drainage at tube sites. RN aware. Assisted with changing of new gown and tube still leaking, so deferred standing per pt preference. Completed with weighted medicine ball (2.2lbs) bicep curls and focusing on grip of ball manipulation with single hand - pt states she feels like her hands have been getting weaker and was uncomfortable in her fingertips due to the recent finger sticks and how many have been occurring. End of session set up with all needs in reach.  Therapy Documentation Precautions:  Precautions Precautions: Fall, Other (comment) Precaution/Restrictions Comments: JP drainx2 (L), PEG tube Restrictions Weight Bearing Restrictions Per Provider Order:  No  Pain: Reports pain is ok unless she is moving    Therapy/Group: Individual Therapy  Elnor Pizza Sherrell Pizza WENDI Elnor, PT, DPT, CBIS  08/31/2024, 1:46 PM

## 2024-08-31 NOTE — Progress Notes (Signed)
 Occupational Therapy Session Note  Patient Details  Name: Lauren Gray MRN: [id number] Date of Birth: January 05, 1951  Today's Date: 08/31/2024 OT Individual Time: 0800-0900 & 1420-1500 OT Individual Time Calculation (min): 60 min & 40 min   Short Term Goals: Week 2:  OT Short Term Goal 1 (Week 2): Pt willl be able to stand and manage clothing over hips with min A pre and post toileting. OT Short Term Goal 2 (Week 2): Pt will be able to don pants over feet using reacher with min A. OT Short Term Goal 3 (Week 2): Pt will be able to walk in and out of the bathroom with CGA and no symptoms of shortness of breath to demonstrate improved activity tolerance.  Skilled Therapeutic Interventions/Progress Updates:      Therapy Documentation Precautions:  Precautions Precautions: Fall, Other (comment) Precaution/Restrictions Comments: JP drainx2 (L), PEG tube Restrictions Weight Bearing Restrictions Per Provider Order: No Session 1 General:  Pt supine in bed upon OT arrival, agreeable to OT session. Husband present  Pain:  3/10 pain reported in drain site, area assessed, activity, intermittent rest breaks, distractions provided for pain management, pt reports tolerable to proceed.    ADL: OT providing skilled intervention on ADL retraining in order to increase independence with tasks and increase activity tolerance. Pt completed the following tasks at the current level of assist: Bed mobility: SBA with HOB raised and use of bed rails Grooming/oral hygiene: seated at sink in W/C, SBA to perform oral hygiene and wash face Toilet transfer: CGA from bed><BSC with no AD, sta nd step transfer with no LOB, raised bed slightly for increased independence Toileting: Min A, assistance required with peri hygiene after BM (+) for BM, pt able to complete pants management  Other Treatments: OT starting to educate pt on energy conservation. OT will give handout for further explanation/education for pt in next  session.    Pt seated in recliner at end of session with W/C alarm donned, call light within reach and 4Ps assessed.     Session 2 General: Pt seated in W/C upon OT arrival, agreeable to OT. Daughter present   Pain: no pain reported  Exercises: Pt completed the following exercise circuit in order to improve functional activity, strength and endurance to prepare for ADLs such as bathing. Pt completed the following exercises in seated position with no noted LOB/SOB and 3x10 repetitions on each exercise and yellow theraband: -bicep curls -triceps extensions -shoulder abduction -forward punches -straight leg raises  Other Treatments: OT providing handout for energy conservation describing what it is, purpose of conserving energy and specific examples of how to conserve energy during ADL and IADL tasks. Pt appreciative for information with good carryover. OT also educating pt and family on ability to bring food in for pt to keep in nonperishable pt fridge at nsg station d/t wanting pt to increase nutrients.    Pt seated in recliner at end of session with call light within reach and 4Ps assessed.    Therapy/Group: Individual Therapy  Camie Hoe, OTD, OTR/L 08/31/2024, 8:55 AM

## 2024-08-31 NOTE — Progress Notes (Signed)
 Subjective/Chief Complaint:  Up in the chair. States she ate a good dinner last night- beef and carrots. But had some diarrhea with urgency/inability to make to the commode. 5x  Objective: Vital signs in last 24 hours: Temp:  [98.1 F (36.7 C)-98.8 F (37.1 C)] 98.7 F (37.1 C) (10/19 0402) Pulse Rate:  [77-89] 82 (10/19 0402) Resp:  [18-26] 19 (10/19 0402) BP: (129-156)/(52-62) 156/60 (10/19 0402) SpO2:  [97 %-99 %] 98 % (10/19 0402) Weight:  [887 kg] 112 kg (10/19 0500) Last BM Date : 08/30/24  Intake/Output from previous day: 10/18 0701 - 10/19 0700 In: 744.2 [P.O.:218; I.V.:5; NG/GT:300; IV Piggyback:206.2] Out: 15 [Drains:15] Intake/Output this shift: No intake/output data recorded.  PE:  Constitutional: getting cleaned up from large loose stool  Eyes: Anicteric sclerae Lungs: normal respiratory effort GI: Soft, overall non-tender to palpation, IR drain with cloudy colorless output. Output not charted - discussed with RN. J tube clamped with scant bilious drainage around it  Psychiatric: appropriate judgment and insight, oriented to person, place, and time   Lab Results:  Recent Labs    08/29/24 0622  WBC 7.4  HGB 9.7*  HCT 32.0*  PLT 377   BMET Recent Labs    08/29/24 0622 08/30/24 0512  NA 134* 136  K 5.7* 4.1  CL 108 108  CO2 12* 17*  GLUCOSE 289* 283*  BUN 22 19  CREATININE 1.12* 1.26*  CALCIUM  7.9* 7.9*  Anti-infectives: Anti-infectives (From admission, onward)    Start     Dose/Rate Route Frequency Ordered Stop   08/30/24 0800  amoxicillin-clavulanate (AUGMENTIN) 875-125 MG per tablet 1 tablet  Status:  Discontinued        1 tablet Oral Every 12 hours 08/28/24 1102 08/29/24 1258   08/28/24 1400  piperacillin -tazobactam (ZOSYN ) IVPB 3.375 g        3.375 g 12.5 mL/hr over 240 Minutes Intravenous Every 8 hours 08/28/24 1102 09/06/24 1359   08/28/24 1130  amoxicillin-clavulanate (AUGMENTIN) 875-125 MG per tablet 1 tablet  Status:   Discontinued        1 tablet Oral Every 12 hours 08/28/24 1033 08/28/24 1057   08/18/24 0800  fluconazole  (DIFLUCAN ) tablet 200 mg  Status:  Discontinued        200 mg Oral Daily 08/17/24 0819 08/25/24 0958   08/17/24 1000  fluconazole  (DIFLUCAN ) tablet 400 mg        400 mg Oral  Once 08/17/24 0819 08/17/24 1002       Assessment/Plan: s/p Whipple,  jejunostomy tube Dr. 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  Margins negative.  18 h tube feed cycle for full calorie intake; currently decreased to 14 hours  Megace for appetite stimulant.   carb mod diet as tolerated.  Continue banatrol , hopefully with her back on solid food her stools will bulk up again  IR drain in place.  No surgical drains remaining.  Klebsiella grew from drain perc drain.  Pseudomonas now growing also.  Continue zosyn , don't switch over to augmentin.  CT repeated 10/17 w/ fluid collection near distal pancreas - not drainable per IR unfortunately. Continue IV abx. Anticipate continued drainage from skin hole from drain. Will likely need repeat imaging this week.    Dispo - per PM&R. Tentative d/c planned 10/21    LOS: 16 days   Lauren Gray, [facility] Please see Amion for pager number during day hours  7:00am-4:30pm

## 2024-08-31 NOTE — Progress Notes (Signed)
 PROGRESS NOTE    Subjective/Complaints:   Having loose stools. Appetite is fair but only ate 10-25% of meals yesterday. TF still runs. Persistent abdominal discomfort   ROS: Patient denies fever, rash, sore throat, blurred vision, dizziness,  vomiting,   cough, shortness of breath or chest pain, joint or back/neck pain, headache, or mood change.      Objective:   No results found.      Recent Labs    08/29/24 0622  WBC 7.4  HGB 9.7*  HCT 32.0*  PLT 377    Recent Labs    08/29/24 0622 08/30/24 0512  NA 134* 136  K 5.7* 4.1  CL 108 108  CO2 12* 17*  GLUCOSE 289* 283*  BUN 22 19  CREATININE 1.12* 1.26*  CALCIUM  7.9* 7.9*     Intake/Output Summary (Last 24 hours) at 08/31/2024 1043 Last data filed at 08/31/2024 0600 Gross per 24 hour  Intake 744.2 ml  Output 15 ml  Net 729.2 ml        Physical Exam: Vital Signs Blood pressure (!) 156/60, pulse 82, temperature 98.7 F (37.1 C), temperature source Oral, resp. rate 19, height 5' 8 (1.727 m), weight 112 kg, SpO2 98%.   Constitutional: No distress . Vital signs reviewed. HEENT: NCAT, EOMI, oral membranes moist Neck: supple Cardiovascular: RRR without murmur. No JVD    Respiratory/Chest: CTA Bilaterally without wheezes or rales. Normal effort    GI/Abdomen: BS +, sl tender, J-tube still clamped. Small amount of milky drainage in JP Ext: no clubbing, cyanosis, or edema Psych: pleasant and cooperative  Skin:  Midline incision clean Neuro: AAOx4. No apparent cognitive deficits    Sensory exam: revealed normal sensation in all dermatomal regions in bilateral upper extremities and bilateral lower extremities Motor exam: strength 5-/5 throughout bilateral upper extremities and bilateral lower extremities Coordination: Fine motor coordination was normal.        Assessment/Plan: 1. Functional deficits which require 3+ hours per day of  interdisciplinary therapy in a comprehensive inpatient rehab setting. Physiatrist is providing close team supervision and 24 hour management of active medical problems listed below. Physiatrist and rehab team continue to assess barriers to discharge/monitor patient progress toward functional and medical goals  Care Tool:  Bathing    Body parts bathed by patient: Right arm, Left arm, Chest, Abdomen, Front perineal area, Buttocks, Right upper leg, Left upper leg, Face     Body parts n/a: Right lower leg, Left lower leg   Bathing assist Assist Level: Minimal Assistance - Patient > 75%     Upper Body Dressing/Undressing Upper body dressing   What is the patient wearing?: Pull over shirt    Upper body assist Assist Level: Set up assist    Lower Body Dressing/Undressing Lower body dressing      What is the patient wearing?: Pants     Lower body assist Assist for lower body dressing: Moderate Assistance - Patient 50 - 74%     Toileting Toileting    Toileting assist Assist for toileting: Total Assistance - Patient < 25%     Transfers Chair/bed transfer  Transfers assist  Chair/bed transfer activity did not occur: Safety/medical  concerns (2/2 high fatigue)  Chair/bed transfer assist level: Supervision/Verbal cueing     Locomotion Ambulation   Ambulation assist   Ambulation activity did not occur: Safety/medical concerns (2/2 high fatigue)  Assist level: Contact Guard/Touching assist Assistive device: Walker-rolling Max distance: 75   Walk 10 feet activity   Assist  Walk 10 feet activity did not occur: Safety/medical concerns (2/2 high fatigue)  Assist level: Contact Guard/Touching assist Assistive device: Walker-rolling   Walk 50 feet activity   Assist Walk 50 feet with 2 turns activity did not occur: Safety/medical concerns (2/2 high fatigue)  Assist level: Contact Guard/Touching assist Assistive device: Walker-rolling    Walk 150 feet  activity   Assist Walk 150 feet activity did not occur: Safety/medical concerns         Walk 10 feet on uneven surface  activity   Assist Walk 10 feet on uneven surfaces activity did not occur: Safety/medical concerns (2/2 high fatigue)         Wheelchair     Assist Is the patient using a wheelchair?: Yes Type of Wheelchair: Manual Wheelchair activity did not occur: Safety/medical concerns (2/2 high fatigue)         Wheelchair 50 feet with 2 turns activity    Assist    Wheelchair 50 feet with 2 turns activity did not occur: Safety/medical concerns (2/2 high fatigue)       Wheelchair 150 feet activity     Assist  Wheelchair 150 feet activity did not occur: Safety/medical concerns (2/2 high fatigue)       Blood pressure (!) 156/60, pulse 82, temperature 98.7 F (37.1 C), temperature source Oral, resp. rate 19, height 5' 8 (1.727 m), weight 112 kg, SpO2 98%.   Medical Problem List and Plan: 1. 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  Antithrombotics: -DVT/anticoagulation:  Pharmaceutical: Lovenox              -antiplatelet therapy: N/A 3. Pain Management: Tylenol  prn.  .    Pain improving --uses tramadol  on occasion 4. Mood/Behavior/Sleep: LCSW to follow for evaluation and support.              -antipsychotic agents: N/A 5. Neuropsych/cognition: This patient is capable of making decisions on her own behalf. 6. Skin/Wound Care: Monitor wound for healing.  Still with 2 lines as well as J-tube some tenderness around the J-tube but no drainage  -can't drain fluid collection at distal pancreas per IR  10/19 continue lines per surgery who's following 7. Fluids/Electrolytes/Nutrition: Monitor I/O. Continue tube feeds    Latest Ref Rng & Units  08/30/2024    5:12 AM 08/29/2024    6:22 AM 08/28/2024    5:36 AM  BMP  Glucose 70 - 99 mg/dL 716  710  730   BUN 8 - 23 mg/dL 19  22  29    Creatinine 0.44 - 1.00 mg/dL 8.73  8.87  8.67   Sodium 135 - 145 mmol/L 136  134  134   Potassium 3.5 - 5.1 mmol/L 4.1  5.7  4.4   Chloride 98 - 111 mmol/L 108  108  105   CO2 22 - 32 mmol/L 17  12  15  Calcium  8.9 - 10.3 mg/dL 7.9  7.9  8.3     89/81 Cr near baseline--no changes 8. Duodenal adenocarcinoma w/gastric outlet obst:  continues on tube feed for nutritional support.  Also postop ileus.  At this point not tolerating p.o., will make n.p.o. until cleared by general surgery On tube feeds for nutrition   -10/18--TF for 14 hours, cleared to resume CM diet by surgery    -monitor po intake  10/19 pt doesn't have great appetite, trying to eat.    -frustrated by diarrhea   -I'll defer to surgery re TF duration.    -pt already on bannatrol to bulk stool up. Not sure what else to add. Still on abx which isn't helping either   -will ask RD if she has any further ideas.  9. Abdominal distension:  improve motility lass stomach gas, some mild ileus noted per Gen surg  only ~50mg  tramadol  per day     Chest pain resolved off of p.o. feeds 10. Fluid overload/Pleural effusions: In part due to third spacing. S/p thoracocentesis.  Right side improved , left side    11. HTN: Monitor BP TID--on amlodipine , catapres  and coreg          Mild elevation monitor on current meds avoid diuretics due to hydration status Vitals:   08/31/24 0010 08/31/24 0402  BP: (!) 130/55 (!) 156/60  Pulse: 79 82  Resp: 18 19  Temp: 98.6 F (37 C) 98.7 F (37.1 C)  SpO2: 97% 98%   12.  Prediabetes: Hgb A1c- 6.1. Elevated BS due to tube feeds             --continue Lantus  with meal coverage and SSI             --monitor BS ac/hs and use SSI for elevated BS   CBG (last 3)  Recent Labs    08/30/24 1632 08/30/24 2156 08/31/24 0628  GLUCAP 128* 137* 224*  Increase  lantus  10/12 to 20U, minimal po intake , no change in TF since 10/9 Of change Lantus  to 35 units nightly -10/19 likely will need to make changes but with diet resumed will observe for pattern   13. Acute on chronic kidney disease: SCr 1.3 at admission and now WNL 14.  Obesity  Class 2: BMI 38.47 15.  ABLA: Recheck CBC in am. Hgb 8-9 range.   Stable 10/17    Latest Ref Rng & Units 08/29/2024    6:22 AM 08/28/2024    5:36 AM 08/25/2024    6:04 AM  CBC  WBC 4.0 - 10.5 K/uL 7.4  12.3  7.8   Hemoglobin 12.0 - 15.0 g/dL 9.7  8.5  8.4   Hematocrit 36.0 - 46.0 % 32.0  27.6  26.4   Platelets 150 - 400 K/uL 377  410  422     16. Severe protein malnutrition- J tube placed 9/9 Albumin  of 1.5- is declining-    17. Thrush  Resolved after nystatin  and diflucan   Ask dietary to follow oral vs enteral intake  18.  Leukocytosis resolved after initiation of IV Zosyn  10/18 -continue zosyn  per general surgery  LOS: 16 days A FACE TO FACE EVALUATION WAS PERFORMED  Arthea ONEIDA Gunther 08/31/2024, 10:43 AM

## 2024-09-01 DIAGNOSIS — C259 Malignant neoplasm of pancreas, unspecified: Secondary | ICD-10-CM | POA: Insufficient documentation

## 2024-09-01 DIAGNOSIS — T8143XA Infection following a procedure, organ and space surgical site, initial encounter: Secondary | ICD-10-CM | POA: Insufficient documentation

## 2024-09-01 LAB — BASIC METABOLIC PANEL WITH GFR
Anion gap: 11 (ref 5–15)
BUN: 22 mg/dL (ref 8–23)
CO2: 17 mmol/L — ABNORMAL LOW (ref 22–32)
Calcium: 8.1 mg/dL — ABNORMAL LOW (ref 8.9–10.3)
Chloride: 110 mmol/L (ref 98–111)
Creatinine, Ser: 1.32 mg/dL — ABNORMAL HIGH (ref 0.44–1.00)
GFR, Estimated: 43 mL/min — ABNORMAL LOW (ref >60)
Glucose, Bld: 202 mg/dL — ABNORMAL HIGH (ref 70–99)
Potassium: 4 mmol/L (ref 3.5–5.1)
Sodium: 138 mmol/L (ref 135–145)

## 2024-09-01 LAB — GLUCOSE, CAPILLARY
Glucose-Capillary: 211 mg/dL — ABNORMAL HIGH (ref 70–99)
Glucose-Capillary: 120 mg/dL — ABNORMAL HIGH (ref 70–99)
Glucose-Capillary: 66 mg/dL — ABNORMAL LOW (ref 70–99)
Glucose-Capillary: 134 mg/dL — ABNORMAL HIGH (ref 70–99)

## 2024-09-01 LAB — CBC
HCT: 31 % — ABNORMAL LOW (ref 36.0–46.0)
Hemoglobin: 9.5 g/dL — ABNORMAL LOW (ref 12.0–15.0)
MCH: 26.8 pg (ref 26.0–34.0)
MCHC: 30.6 g/dL (ref 30.0–36.0)
MCV: 87.6 fL (ref 80.0–100.0)
Platelets: 433 K/uL — ABNORMAL HIGH (ref 150–400)
RBC: 3.54 MIL/uL — ABNORMAL LOW (ref 3.87–5.11)
RDW: 15.9 % — ABNORMAL HIGH (ref 11.5–15.5)
WBC: 6.9 K/uL (ref 4.0–10.5)
nRBC: 0 % (ref 0.0–0.2)

## 2024-09-01 MED ORDER — KATE FARMS STANDARD 1.4 EN LIQD
1000.0000 mL | Freq: Every day | ENTERAL | Status: DC
  Administered 2024-09-01 – 2024-09-03 (×4): 1000 mL
  Filled 2024-09-01 (×5): qty 1000

## 2024-09-01 MED ORDER — KATE FARMS STANDARD 1.4 EN LIQD
1000.0000 mL | Freq: Every day | ENTERAL | Status: DC
  Filled 2024-09-01: qty 1000

## 2024-09-01 MED ORDER — OSMOLITE 1.5 CAL PO LIQD: 1000.0000 mL | ORAL | Status: DC

## 2024-09-01 MED ORDER — RELIZORB DEVI
1.0000 | Freq: Three times a day (TID) | Status: DC
  Administered 2024-09-02 – 2024-09-04 (×5): 1

## 2024-09-01 NOTE — Progress Notes (Signed)
 Subjective/Chief Complaint:  Up in chair.  Pt discouraged.  Sad and wants to go home. Still hasn't got abdominal binder.   Objective: Vital signs in last 24 hours: Temp:  [97.8 F (36.6 C)-98.9 F (37.2 C)] 98.9 F (37.2 C) (10/20 0420) Pulse Rate:  [87-96] 94 (10/20 0420) Resp:  [18] 18 (10/20 0420) BP: (142-160)/(61-69) 160/69 (10/20 0420) SpO2:  [97 %-100 %] 97 % (10/20 0420) Weight:  [887 kg] 112 kg (10/20 0500) Last BM Date : 09/01/24  Intake/Output from previous day: 10/19 0701 - 10/20 0700 In: 2705.1 [P.O.:237; NG/GT:2300; IV Piggyback:163.1] Out: 40 [Drains:40] Intake/Output this shift: Total I/O In: 240 [P.O.:240] Out: -   PE:  Constitutional: alert and oriented.  Well groomed.  Eyes: Anicteric sclerae Lungs: normal respiratory effort GI: Soft, overall non-tender to palpation, IR drain bulb with purulent output.  Psychiatric: appropriate judgment and insight, oriented to person, place, and time   Lab Results:  Recent Labs    09/01/24 0627  WBC 6.9  HGB 9.5*  HCT 31.0*  PLT 433*   BMET Recent Labs    08/30/24 0512 09/01/24 0627  NA 136 138  K 4.1 4.0  CL 108 110  CO2 17* 17*  GLUCOSE 283* 202*  BUN 19 22  CREATININE 1.26* 1.32*  CALCIUM  7.9* 8.1*  Anti-infectives: Anti-infectives (From admission, onward)    Start     Dose/Rate Route Frequency Ordered Stop   08/31/24 1600  meropenem (MERREM) 2 g in sodium chloride  0.9 % 100 mL IVPB        2 g 280 mL/hr over 30 Minutes Intravenous Every 8 hours 08/31/24 1442     08/30/24 0800  amoxicillin-clavulanate (AUGMENTIN) 875-125 MG per tablet 1 tablet  Status:  Discontinued        1 tablet Oral Every 12 hours 08/28/24 1102 08/29/24 1258   08/28/24 1400  piperacillin -tazobactam (ZOSYN ) IVPB 3.375 g  Status:  Discontinued        3.375 g 12.5 mL/hr over 240 Minutes Intravenous Every 8 hours 08/28/24 1102 08/31/24 1442   08/28/24 1130  amoxicillin-clavulanate (AUGMENTIN) 875-125 MG per tablet 1  tablet  Status:  Discontinued        1 tablet Oral Every 12 hours 08/28/24 1033 08/28/24 1057   08/18/24 0800  fluconazole  (DIFLUCAN ) tablet 200 mg  Status:  Discontinued        200 mg Oral Daily 08/17/24 0819 08/25/24 0958   08/17/24 1000  fluconazole  (DIFLUCAN ) tablet 400 mg        400 mg Oral  Once 08/17/24 0819 08/17/24 1002       Assessment/Plan: s/p Whipple,  jejunostomy tube Dr. 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  Margins negative.  Complicated by pancreatic leak/abscess  18 h tube feed cycle for full calorie intake; currently decreased to 14 hours  Megace for appetite stimulant.   carb mod diet as tolerated.  Continue banatrol for stool bulking.   IR drain in place.  No surgical drains remaining.  Klebsiella and pseudomonas sensitive to levaquin.  Will switch over tomorrow to oral Antbx.  If has issues overnight, will switch back to IV and call ID.    CT repeated 10/17 w/ fluid collection near distal pancreas - not drainable per IR unfortunately.   Dispo - per PM&R. Tentative d/c planned 10/21, but sounds like 1-2 more days additionally needed for working out kinks of home setup and  safety.    Surgical plan will be to d/c with levaquin for 10 days.  Follow up with me in 2 weeks.      LOS: 17 days    Jina LITTIE Nephew, MD, FACS, FSSO Surgical Oncology, General Surgery, Trauma and Critical [facility] Surgery, [place] [phone] for weekday/non holidays Check [email or web address] for coverage night/weekend/holidays

## 2024-09-01 NOTE — Progress Notes (Addendum)
 Patient ID: Lauren Gray, female   DOB: 09-04-1951, 73 y.o.   MRN: [id number]  Met with pt to discuss discharge needs and am awaiting MD to talk with surgeon to see if needs IV antibiotics at home and for how long. Asked pt again about her need for hospital bed and she does not want one. She would like to know what is going on and informed her when know something will let her know.   12:25 PM Dr Aron was in to see pt and answer her questions and will await her talking with pharmacy and plan for IV antibiotics versus po antibiotics  12:44 PM MD reports will go home on po antibiotics and will work on steps a few more days so new discharge date is 10/23. Let pt know of this plan and she was appreciative. Reports did steps today but needs to do more

## 2024-09-01 NOTE — Progress Notes (Addendum)
 PROGRESS NOTE    Subjective/Complaints:  Gen surgery consulted pharmacy plan is home on Levaquin  Missing therapy due to diarrhea , no additional progress with extension, family concerned about steps at home   Daughter at bedside , daughter feels ok about managing drains and J tube    ROS: Patient denies CP, SOB,  N/V/D    Objective:   No results found.      Recent Labs    09/01/24 0627  WBC 6.9  HGB 9.5*  HCT 31.0*  PLT 433*    Recent Labs    08/30/24 0512 09/01/24 0627  NA 136 138  K 4.1 4.0  CL 108 110  CO2 17* 17*  GLUCOSE 283* 202*  BUN 19 22  CREATININE 1.26* 1.32*  CALCIUM  7.9* 8.1*     Intake/Output Summary (Last 24 hours) at 09/01/2024 0824 Last data filed at 09/01/2024 0800 Gross per 24 hour  Intake 2945.14 ml  Output 40 ml  Net 2905.14 ml        Physical Exam: Vital Signs Blood pressure (!) 160/69, pulse 94, temperature 98.9 F (37.2 C), temperature source Oral, resp. rate 18, height 5' 8 (1.727 m), weight 112 kg, SpO2 97%.    General: No acute distress Mood and affect are appropriate Heart: Regular rate and rhythm no rubs murmurs or extra sounds Lungs: Clear to auscultation, breathing unlabored, no rales or wheezes Abdomen: Positive bowel sounds, soft nontender to palpation, nondistended Extremities: No clubbing, cyanosis, or edema Skin: No evidence of breakdown, no evidence of rash   Skin:  Midline incision clean Neuro: AAOx4. No apparent cognitive deficits    Sensory exam: revealed normal sensation in all dermatomal regions in bilateral upper extremities and bilateral lower extremities Motor exam: strength 5-/5 throughout bilateral upper extremities and bilateral lower extremities Coordination: Fine motor coordination was normal.        Assessment/Plan: 1. Functional deficits which require 3+ hours per day of interdisciplinary therapy in a comprehensive  inpatient rehab setting. Physiatrist is providing close team supervision and 24 hour management of active medical problems listed below. Physiatrist and rehab team continue to assess barriers to discharge/monitor patient progress toward functional and medical goals  Care Tool:  Bathing    Body parts bathed by patient: Right arm, Left arm, Chest, Abdomen, Front perineal area, Buttocks, Right upper leg, Left upper leg, Face     Body parts n/a: Right lower leg, Left lower leg   Bathing assist Assist Level: Minimal Assistance - Patient > 75%     Upper Body Dressing/Undressing Upper body dressing   What is the patient wearing?: Pull over shirt    Upper body assist Assist Level: Set up assist    Lower Body Dressing/Undressing Lower body dressing      What is the patient wearing?: Pants     Lower body assist Assist for lower body dressing: Moderate Assistance - Patient 50 - 74%     Toileting Toileting    Toileting assist Assist for toileting: Total Assistance - Patient < 25%     Transfers Chair/bed transfer  Transfers assist  Chair/bed transfer activity did not occur: Safety/medical concerns (2/2 high fatigue)  Chair/bed  transfer assist level: Supervision/Verbal cueing     Locomotion Ambulation   Ambulation assist   Ambulation activity did not occur: Safety/medical concerns (2/2 high fatigue)  Assist level: Contact Guard/Touching assist Assistive device: Walker-rolling Max distance: 75   Walk 10 feet activity   Assist  Walk 10 feet activity did not occur: Safety/medical concerns (2/2 high fatigue)  Assist level: Contact Guard/Touching assist Assistive device: Walker-rolling   Walk 50 feet activity   Assist Walk 50 feet with 2 turns activity did not occur: Safety/medical concerns (2/2 high fatigue)  Assist level: Contact Guard/Touching assist Assistive device: Walker-rolling    Walk 150 feet activity   Assist Walk 150 feet activity did not  occur: Safety/medical concerns         Walk 10 feet on uneven surface  activity   Assist Walk 10 feet on uneven surfaces activity did not occur: Safety/medical concerns (2/2 high fatigue)         Wheelchair     Assist Is the patient using a wheelchair?: Yes Type of Wheelchair: Manual Wheelchair activity did not occur: Safety/medical concerns (2/2 high fatigue)         Wheelchair 50 feet with 2 turns activity    Assist    Wheelchair 50 feet with 2 turns activity did not occur: Safety/medical concerns (2/2 high fatigue)       Wheelchair 150 feet activity     Assist  Wheelchair 150 feet activity did not occur: Safety/medical concerns (2/2 high fatigue)       Blood pressure (!) 160/69, pulse 94, temperature 98.9 F (37.2 C), temperature source Oral, resp. rate 18, height 5' 8 (1.727 m), weight 112 kg, SpO2 97%.   Medical Problem List and Plan: 1. 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  Antithrombotics: -DVT/anticoagulation:  Pharmaceutical: Lovenox              -antiplatelet therapy: N/A 3. Pain Management: Tylenol  prn.  .    Pain improving --uses tramadol  on occasion 4. Mood/Behavior/Sleep: LCSW to follow for evaluation and support.              -antipsychotic agents: N/A 5. Neuropsych/cognition: This patient is capable of making decisions on her own behalf. 6. Skin/Wound Care: Monitor wound for healing.  Still with 2 lines as well as J-tube some tenderness around the J-tube but no drainage  -can't drain fluid collection at distal pancreas per IR  10/19 continue lines per surgery who's following 7. Fluids/Electrolytes/Nutrition: Monitor I/O. Continue tube feeds    Latest Ref Rng & Units 09/01/2024    6:27 AM 08/30/2024    5:12 AM  08/29/2024    6:22 AM  BMP  Glucose 70 - 99 mg/dL 797  716  710   BUN 8 - 23 mg/dL 22  19  22    Creatinine 0.44 - 1.00 mg/dL 8.67  8.73  8.87   Sodium 135 - 145 mmol/L 138  136  134   Potassium 3.5 - 5.1 mmol/L 4.0  4.1  5.7   Chloride 98 - 111 mmol/L 110  108  108   CO2 22 - 32 mmol/L 17  17  12    Calcium  8.9 - 10.3 mg/dL 8.1  7.9  7.9      Requires tube feeds via J tube due to gastric outlet obstruction , anticipate need for months to improve nutrition for wound healing  8. Duodenal adenocarcinoma w/gastric outlet obst:  continues on tube feed for nutritional support.  Diarrhea due to TF, although imaging looked like some residual ileus 9. Abdominal distension:  improve motility lass stomach gas, some mild ileus noted per Gen surg  only ~50mg  tramadol  per day     Chest pain resolved off of p.o. feeds 10. Fluid overload/Pleural effusions: In part due to third spacing. S/p thoracocentesis.  Right side improved , left side    11. HTN: Monitor BP TID--on amlodipine , catapres  and coreg          Mild elevation monitor on current meds avoid diuretics due to hydration status Vitals:   08/31/24 2121 09/01/24 0420  BP: (!) 142/61 (!) 160/69  Pulse: 96 94  Resp: 18 18  Temp: 98.7 F (37.1 C) 98.9 F (37.2 C)  SpO2: 99% 97%   12.  Prediabetes: Hgb A1c- 6.1. Elevated BS due to tube feeds             --continue Lantus  with meal coverage and SSI             --monitor BS ac/hs and use SSI for elevated BS   CBG (last 3)  Recent Labs    08/31/24 1626 08/31/24 2118 09/01/24 0612  GLUCAP 120* 192* 211*  Increase lantus  10/12 to 20U, minimal po intake , no change in TF since 10/9 Of change Lantus  to 35 units nightly -10/19 likely will need to make changes but with diet resumed will observe for pattern Increase lantus  to 40U   13. Acute on chronic kidney disease: SCr 1.3 at admission and now WNL 14.  Obesity  Class 2: BMI 38.47 15.  ABLA: Recheck CBC in am. Hgb 8-9 range.   Stable  10/17    Latest Ref Rng & Units 09/01/2024    6:27 AM 08/29/2024    6:22 AM 08/28/2024    5:36 AM  CBC  WBC 4.0 - 10.5 K/uL 6.9  7.4  12.3   Hemoglobin 12.0 - 15.0 g/dL 9.5  9.7  8.5   Hematocrit 36.0 - 46.0 % 31.0  32.0  27.6   Platelets 150 - 400 K/uL 433  377  410     16. Severe protein malnutrition- J tube placed 9/9 Albumin  of 1.5- is declining-    17. Thrush  Resolved after nystatin  and diflucan   Ask dietary to follow oral vs enteral intake  18.  Abd abscess with Leukocytosis resolved after initiation of IV Zosyn  10/20- as per cultures and pharmacy rec switched to meropenem, ? Duration of treatment  Likely will need PICC   Repeat CT abd this week  per surgery -  ? Need for transfer to acute, vs additional day or 2 on rehab if we can work on steps . F/u with Dr Aron on this   LOS: 17 days A FACE TO FACE EVALUATION WAS PERFORMED  Prentice FORBES Compton 09/01/2024, 8:24 AM

## 2024-09-01 NOTE — Plan of Care (Signed)
  Problem: RH BOWEL ELIMINATION Goal: RH STG MANAGE BOWEL WITH ASSISTANCE Description: STG Manage Bowel with mod I Assistance. Outcome: Progressing   Problem: RH PAIN MANAGEMENT Goal: RH STG PAIN MANAGED AT OR BELOW PT'S PAIN GOAL Description: Pain < 4 with prns Outcome: Progressing   Problem: RH KNOWLEDGE DEFICIT GENERAL Goal: RH STG INCREASE KNOWLEDGE OF SELF CARE AFTER HOSPITALIZATION Description: Patient and spouse will be able to manage care at discharge using educational resources independently Outcome: Not Progressing

## 2024-09-01 NOTE — Progress Notes (Signed)
 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 Will be delivered by tomorrow 10/21 RD will provide inservice on use and education when it arrives Continue tube feeding via J tube: [place] 1.4 at 100 ml/h x 14 h/day (from 6pm-8am) (1400 ml per day) Provides 1960 kcal, 86 gm protein, 994 ml free water  daily Relizorb TID (RD will provide education to nursing staff on use and set up when it arrives): each cartridge covers 500 ml; 1st cartridge at 6pm, 2nd cartridge at 10pm, 3rd cartridge at 3am Pt's discharge regimen: Nutren 1.5 @ 95 ml/h x 14 h/day (from 6pm-8am) (1330 ml per day) Provides 1995 kcal, 90 g protein, 1016 water  3 cartons Relizorb daily: each carton covers 500 ml of formula; 1st cartridge at 6pm, 2nd and 3rd cartridge hooked up tandem at 10pm  NUTRITION DIAGNOSIS:   Moderate Malnutrition related to acute illness (s/p Whipple) as evidenced by mild fat depletion, mild muscle depletion. Remains applicable  GOAL:   Patient will meet greater than or equal to 90% of their needs Progressing  MONITOR:   PO intake, TF tolerance, Diet advancement  REASON FOR ASSESSMENT:   Consult Assessment of nutrition requirement/status, Calorie Count  ASSESSMENT:   Pt with hx of intermittent n/v for 1 month with recent admission 8/22-10/3 where gastric outlet obstruction was found, biopsy positive for adenocarcinoma and pt underwent Whipple procedure 9/9. Pt's hospital course included TPN and J tube placement 9/9. Pt's diet advanced to soft diet at time of discharge but still receiving 100% of needs via enteral nutrition. Pt  admitted to CIR for functional decline.   Spoke with pt who was resting in bedside chair. Pt eager for discharge but has been unable to participate in therapy due to ongoing diarrhea. Pt reports she does feel like she likes [place] [place] formula more than Osmolite since it has a less sweet smell and she feels better than she did in the mornings before. Discussed formula for discharge and explained how Relizorb has been tested on Nutren 1.5 (Osmolite equivalent) but not [employer] equivalent). Pt agreeable to use Nutren 1.5 for outpatient formula since it is compatible with Relizorb.   Discussed ordering Relizorb to be overnighted so pt can trial it with nocturnal regimen starting tomorrow to monitor for improvements in tolerance and diarrhea. Pt excited to try the product.   Pt reports her taste changes have improved but now that she is starting back on IV antibiotics she feels like that may be impacted. Intake remains poor even though pt is trying very hard to improve oral intake.   Discussed beginning Relizorb with care team and will work on ordering it for pt. Will begin working pt up for outpatient coverage for Relizorb. Estimated discharge now pushed back to 10/23.   Medications: ProSource TF 20 daily Banatrol BID Free water  100 mL TID SSI 0-15 units TID SSI 0-5 units daily Novolog  6 units TID Semglee  30 units daily Creon  TID w/ meals Megace Protonix  Meropenem   Labs: CBG x 24 hr: 120-211 mg/dL J8r 6.1   Diet Order:   Diet Order             Diet  Carb Modified Fluid consistency: Thin; Room service appropriate? Yes  Diet effective now                   EDUCATION NEEDS:   Education needs have been addressed  Skin:  Skin Assessment: Reviewed RN Assessment  Last BM:  4 type 6/7 stools in last 24 hr  Height:   Ht Readings from Last 1 Encounters:  08/15/24 5' 8 (1.727 m)    Weight:   Wt Readings from Last 1 Encounters:  09/01/24 112 kg    Ideal  Body Weight:  63.6 kg  BMI:  Body mass index is 37.54 kg/m.  Estimated Nutritional Needs:   Kcal:  2000-2200  Protein:  110-125g  Fluid:  >/= 2L    Josette Glance, MS, RDN, LDN Clinical Dietitian I Please reach out via secure chat or call [phone]

## 2024-09-01 NOTE — Progress Notes (Signed)
 Physical Therapy Session Note  Patient Details  Name: Lauren Gray MRN: [id number] Date of Birth: 1951/10/01  Today's Date: 09/01/2024 PT Individual Time: 9191-9164, 1400-1500 PT Individual Time Calculation (min): 27 min, 60 min   Short Term Goals: Week 2:  PT Short Term Goal 1 (Week 2): STG = LTG d/t ELOS  Skilled Therapeutic Interventions/Progress Updates:    Session 1: pt received in bed and agreeable to therapy. No complaint of pain. Pt asking to get out of bed. Pt able to come to EOB with supervision and bed features with increased time. Pt reports dizziness that improves once sitting. Min a for Sit to stand and Stand pivot transfer to recliner with RW. Increased time for recovery after transfer. Attempted Sit to stand practice, but pt reports light headedness after 1st attempt at min a. BP noted to be up to 165/56(85) 95. Pt reports improvement after sitting several minutes with LE elevated. Nsg made aware of elevated BP. Pt remained in recliner after session and was left with all needs in reach and alarm active.   Session 2; Pt received in recliner and agreeable to therapy.  No complaint of pain. Pt requesting to use bathroom. ambulatory transfer with RW and min a, VC for RW proximity. Continent B+B void. Supervision hygiene, max a clothing management. Hand hygiene at w/c level. Pt transported to therapy gym for time management and energy conservation.   Pt participated in stair training on 6 stairs, 4 x 4 with BUE and CGA fading to min a with fatigue. Extended rest breaks for fatigue management. Pt with step to pattern, but using each LE on various sets. Pt returned to room and completed CGA ambulatory transfer back to recliner. Remained in recliner with husband present and needs in reach.   Therapy Documentation Precautions:  Precautions Precautions: Fall, Other (comment) Precaution/Restrictions Comments: JP drainx2 (L), PEG tube Restrictions Weight Bearing Restrictions Per  Provider Order: No General:       Therapy/Group: Individual Therapy  [name] 09/01/2024, 12:57 PM

## 2024-09-01 NOTE — Progress Notes (Signed)
 Occupational Therapy Session Note  Patient Details  Name: Lauren Gray MRN: [id number] Date of Birth: February 06, 1951  Today's Date: 09/01/2024 OT Individual Time: 8991-8891 OT Individual Time Calculation (min): 60 min    Short Term Goals: Week 2:  OT Short Term Goal 1 (Week 2): Pt willl be able to stand and manage clothing over hips with min A pre and post toileting. OT Short Term Goal 2 (Week 2): Pt will be able to don pants over feet using reacher with min A. OT Short Term Goal 3 (Week 2): Pt will be able to walk in and out of the bathroom with CGA and no symptoms of shortness of breath to demonstrate improved activity tolerance.  Skilled Therapeutic Interventions/Progress Updates:  Pt greeted seated in recliner, tearful because she was told she has been refusing therapy and can't do the steps. Discussed alternate plan of staying on main level with hospital bed and BSC, pt does not like this plan.   A lot of session focused on providing therapeutic listening as pt emotional about DC plan. Dicussed all options with pt continuing to report that she wants to be able to live on the main level of her house which would mean she needs to complete at least 6-7 steps.   Focused session on stair training as this is pts main concern and obstacle to going home.    Transfers/bed mobility/functional mobility:  Pt completed 3x6 steps with BUE support on rails with CGA.  Pt rested for ~ 10 mins and then requested to complete 3 inch steps for endurance training, pt completed steps with BUE support with CGA.   Pt reports decreased endurance when completing steps but only required CGA.     ADLs:  UB dressing:pt donned [place] shirt with supervision.  LB dressing: pt donned pants with supervision.    Ended session with pt seated in recliner with all needs within reach. Therapy Documentation Precautions:  Precautions Precautions: Fall, Other (comment) Precaution/Restrictions Comments: JP drainx2  (L), PEG tube Restrictions Weight Bearing Restrictions Per Provider Order: No  Pain: No pain   Therapy/Group: Individual Therapy  Ronal Mallie Needy 09/01/2024, 12:27 PM

## 2024-09-01 NOTE — Significant Event (Signed)
 Hypoglycemic Event  CBG: 66  Treatment: 4 oz juice/soda  Symptoms: None  Follow-up CBG: Time:1810 CBG Result:134  Possible Reasons for Event: Unknown  Comments/MD notified:Patient given 4 oz. Sprite and dinner tray    Tobie Perdue G

## 2024-09-01 NOTE — Progress Notes (Incomplete)
 Inpatient Rehabilitation Discharge Medication Review by a Pharmacist  A complete drug regimen review was completed for this patient to identify any potential clinically significant medication issues.  High Risk Drug Classes Is patient taking? Indication by Medication  Antipsychotic No   Anticoagulant No   Antibiotic Yes  Meropenem- IAI (EOT  (could just do a FQ at d/c- discussed with Dr. Aron with [facility] and she wants to keep her on IV until closer to discharge and then flip to [facility] +/- MTZ  )   Opioid Yes Hydocodone/APAP, tramadol  -Pain  Antiplatelet No   Hypoglycemics/insulin  Yes insulin - Hyperglycemia  Vasoactive Medication Yes Amlodipine , carvedilol, clonidine , hydralazine  -HTN  Chemotherapy No   Other Yes Creon - pancreatic enzymes gabapentin - neuropathy  Megace- appetite stimulation  Pantoprazole - reflux  simethicone- gas   trazodone -insomnia Robaxin - muscle spasms      Type of Medication Issue Identified Description of Issue Recommendation(s)  Drug Interaction(s) (clinically significant)     Duplicate Therapy     Allergy     No Medication Administration End Date     Incorrect Dose     Additional Drug Therapy Needed     Significant med changes from prior encounter (inform family/care partners about these prior to discharge). PTA meds not resumed inpatient: Morphine ,  Communicate relevant medication changes to patient/family members at discharge from CIR.   Restart or discontinue PTA meds not resumed in CIR at discharge if clinically indicated.   Other       Clinically significant medication issues were identified that warrant physician communication and completion of prescribed/recommended actions by midnight of the next day:  No  Name of provider notified for urgent issues identified:   Provider Method of Notification:     Pharmacist comments:   Time spent performing this drug regimen review (minutes):  15

## 2024-09-02 LAB — GLUCOSE, CAPILLARY
Glucose-Capillary: 199 mg/dL — ABNORMAL HIGH (ref 70–99)
Glucose-Capillary: 184 mg/dL — ABNORMAL HIGH (ref 70–99)
Glucose-Capillary: 153 mg/dL — ABNORMAL HIGH (ref 70–99)

## 2024-09-02 MED ORDER — LEVOFLOXACIN 750 MG PO TABS
750.0000 mg | ORAL_TABLET | Freq: Every day | ORAL | Status: DC
  Administered 2024-09-02 – 2024-09-04 (×3): 750 mg via ORAL
  Filled 2024-09-02 (×4): qty 1

## 2024-09-02 NOTE — Progress Notes (Signed)
 Patient ID: Lauren Gray, female   DOB: 12-07-1950, 73 y.o.   MRN: [id number]  Met with pt and husband to completed the forms for the relizorb so can get at home. Pt is asking for a wheelchair for when she goes out for appointments. Will look into this

## 2024-09-02 NOTE — Plan of Care (Signed)
  Problem: Consults Goal: RH GENERAL PATIENT EDUCATION Description: See Patient Education module for education specifics. Outcome: Progressing Goal: Nutrition Consult-if indicated Outcome: Progressing Goal: Diabetes Guidelines if Diabetic/Glucose > 140 Description: If diabetic or lab glucose is > 140 mg/dl - Initiate Diabetes/Hyperglycemia Guidelines & Document Interventions  Outcome: Progressing   Problem: RH BOWEL ELIMINATION Goal: RH STG MANAGE BOWEL WITH ASSISTANCE Description: STG Manage Bowel with mod I Assistance. Outcome: Progressing   Problem: RH SAFETY Goal: RH STG ADHERE TO SAFETY PRECAUTIONS W/ASSISTANCE/DEVICE Description: STG Adhere to Safety Precautions With cues Assistance/Device. Outcome: Progressing   Problem: RH PAIN MANAGEMENT Goal: RH STG PAIN MANAGED AT OR BELOW PT'S PAIN GOAL Description: Pain < 4 with prns Outcome: Progressing   Problem: RH KNOWLEDGE DEFICIT GENERAL Goal: RH STG INCREASE KNOWLEDGE OF SELF CARE AFTER HOSPITALIZATION Description: Patient and spouse will be able to manage care at discharge using educational resources independently Outcome: Progressing

## 2024-09-02 NOTE — Progress Notes (Signed)
 Subjective/Chief Complaint:  Feeling a little better.   Objective: Vital signs in last 24 hours: Temp:  [97.6 F (36.4 C)-98.4 F (36.9 C)] 97.6 F (36.4 C) (10/21 0353) Pulse Rate:  [91-99] 91 (10/21 0353) Resp:  [17-18] 18 (10/21 0353) BP: (137-158)/(56-83) 146/60 (10/21 0353) SpO2:  [99 %-100 %] 99 % (10/21 0353) Weight:  [110 kg] 110 kg (10/21 0455) Last BM Date : 09/01/24  Intake/Output from previous day: 10/20 0701 - 10/21 0700 In: 17416.4 [P.O.:720; I.V.:5; NG/GT:16292.7; IV Piggyback:393.7] Out: 230 [Emesis/NG output:200; Drains:30] Intake/Output this shift: No intake/output data recorded.  PE:  Constitutional: alert and oriented.  Well groomed.  Eyes: Anicteric sclerae Lungs: normal respiratory effort GI: Soft, overall non-tender to palpation, IR drain bulb with purulent output. Much less skin drainage.   Psychiatric: appropriate judgment and insight, oriented to person, place, and time   Lab Results:  Recent Labs    09/01/24 0627  WBC 6.9  HGB 9.5*  HCT 31.0*  PLT 433*   BMET Recent Labs    09/01/24 0627  NA 138  K 4.0  CL 110  CO2 17*  GLUCOSE 202*  BUN 22  CREATININE 1.32*  CALCIUM  8.1*  Anti-infectives: Anti-infectives (From admission, onward)    Start     Dose/Rate Route Frequency Ordered Stop   09/02/24 0945  levofloxacin (LEVAQUIN) tablet 750 mg        750 mg Oral Daily 09/02/24 0845     08/31/24 1600  meropenem (MERREM) 2 g in sodium chloride  0.9 % 100 mL IVPB        2 g 280 mL/hr over 30 Minutes Intravenous Every 8 hours 08/31/24 1442     08/30/24 0800  amoxicillin-clavulanate (AUGMENTIN) 875-125 MG per tablet 1 tablet  Status:  Discontinued        1 tablet Oral Every 12 hours 08/28/24 1102 08/29/24 1258   08/28/24 1400  piperacillin -tazobactam (ZOSYN ) IVPB 3.375 g  Status:  Discontinued        3.375 g 12.5 mL/hr over 240 Minutes Intravenous Every 8 hours 08/28/24 1102 08/31/24 1442   08/28/24 1130  amoxicillin-clavulanate  (AUGMENTIN) 875-125 MG per tablet 1 tablet  Status:  Discontinued        1 tablet Oral Every 12 hours 08/28/24 1033 08/28/24 1057   08/18/24 0800  fluconazole  (DIFLUCAN ) tablet 200 mg  Status:  Discontinued        200 mg Oral Daily 08/17/24 0819 08/25/24 0958   08/17/24 1000  fluconazole  (DIFLUCAN ) tablet 400 mg        400 mg Oral  Once 08/17/24 0819 08/17/24 1002       Assessment/Plan: s/p Whipple,  jejunostomy tube Dr. 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  Margins negative.  Complicated by pancreatic leak/abscess  18 h tube feed cycle for full calorie intake; currently decreased to 14 hours  Megace for appetite stimulant.   carb mod diet as tolerated.  Continue banatrol for stool bulking.   IR drain in place.  No surgical drains remaining.  Klebsiella and pseudomonas sensitive to levaquin.  Will switch over today to oral Antbx.  If has issues overnight, will switch back to IV and call ID.    Drain care.   CT repeated 10/17 w/ fluid collection near distal pancreas - not drainable per IR unfortunately. I will not plan on repeating CT as inpatient unless fevers occur.    Dispo -  per PM&R. D/c pushed out to 10/23.    Surgical plan will be to d/c with levaquin for 10 days.  Follow up with me in 2 weeks.      LOS: 18 days    Jina LITTIE Nephew, MD, FACS, FSSO Surgical Oncology, General Surgery, Trauma and Critical [facility] Surgery, [place] [phone] for weekday/non holidays Check [email or web address] for coverage night/weekend/holidays

## 2024-09-02 NOTE — Progress Notes (Signed)
 Patient again in therapy when attempted rounding for 10/14 abscess drain placement in IR; however, chart review shows no concerns with review of labs, VS, drain output. Remains >84mL/24hr from IR drain. No new imaging.  Checked with therapist, schedule not yet set for tomorrow. Will make a note for IR to try to round earlier in day. Notify IR if significant change in output or concerns with insertion site. Dr. Hughes recommends repeat drain evaluation and possible exchange in 7-10 days from placement, currently at day 7 with 30 mL charted in last 24 hr as output total.

## 2024-09-02 NOTE — Progress Notes (Signed)
 Occupational Therapy Session Note  Patient Details  Name: Lauren Gray MRN: [id number] Date of Birth: Jul 05, 1951  Today's Date: 09/02/2024 OT Individual Time: 1132-1206 OT Individual Time Calculation (min): 34 min    Short Term Goals: Week 2:  OT Short Term Goal 1 (Week 2): Pt willl be able to stand and manage clothing over hips with min A pre and post toileting. OT Short Term Goal 2 (Week 2): Pt will be able to don pants over feet using reacher with min A. OT Short Term Goal 3 (Week 2): Pt will be able to walk in and out of the bathroom with CGA and no symptoms of shortness of breath to demonstrate improved activity tolerance.  Skilled Therapeutic Interventions/Progress Updates:    Patient agreeable to participate in OT session. Reports no pain level. Patient waiting in chair when OT arrived. Patient had complaints of dizziness earlier in day. Patient and OT discussed patients plan per her documentation. Patient attempted sit to stand but requested to sit due to dizziness. BP 164/65. Reported to RN and reported to MD due to continued symptoms. OT provided patient with drink to help regulate due to reports of this improving symptoms. Patient completed functional mobility 15 ft with transition from recliner to wc with CGA and verbal cues required for hand placement during sit to stand.  Patient left with nursing all needs in reach alarm on.     Therapy Documentation Precautions:  Precautions Precautions: Fall, Other (comment) Precaution/Restrictions Comments: JP drainx2 (L), PEG tube Restrictions Weight Bearing Restrictions Per Provider Order: No   Therapy/Group: Individual Therapy  D'mariea L Tyashia Morrisette 09/02/2024, 12:56 PM

## 2024-09-02 NOTE — Progress Notes (Addendum)
 Physical Therapy Session Note  Patient Details  Name: Lauren Gray MRN: [id number] Date of Birth: [birth year]  Today's Date: 09/02/2024 PT Individual Time: 1300-1345, 1500-1530 PT Individual Time Calculation (min): 45 min,30 min   Short Term Goals: Week 2:  PT Short Term Goal 1 (Week 2): STG = LTG d/t ELOS  Skilled Therapeutic Interventions/Progress Updates:    Session 1; Pt seated in w/c on arrival and agreeable to therapy. Pt reports unrated headache, contacted nsg to request tylenol . Pt transported to therapy gym for time management and energy conservation. Pt reports having loose stools several times and feeling very weak. Pt reports some dizziness, but able to tolerate therapy with symptom monitoring. Pt transported to therapy gym for time management and energy conservation.   Session focused on stair training for endurance in preparation. Pt performed 2 x 4 sets of 6 stairs with BIL hand rail. Pt requires extended rest break d/t increased fatigue. PA also visited pt while in gym. Returned pt to room and remained in w/c with needs in reach and husband present.   Session 2:  Pt seated in w/c on arrival and agreeable to therapy. Pt reports feeling a bit better since taking tylenol . Pt requesting to get outside, so transported to [facility] entrance for holistic benefit of outdoor environment on mood and affect. While outside, discussed d/c plan and home set up. Therapist reiterated primary team recommendation to use RW at home, but pt stating she learned how to navigate the house sick and insinuating she will not use RW at home from car to bottom of entrance stairs. Would benefit from further discussion to reiterate therapy recommendations to use RW at all times. Pt returned to room and remained in w/c with needs in reach and her husband present.    Therapy Documentation Precautions:  Precautions Precautions: Fall, Other (comment) Precaution/Restrictions Comments: JP drainx2 (L), PEG  tube Restrictions Weight Bearing Restrictions Per Provider Order: No General:       Therapy/Group: Individual Therapy  [name] 09/02/2024, 3:56 PM

## 2024-09-02 NOTE — Progress Notes (Signed)
 PROGRESS NOTE    Subjective/Complaints:  Discussed with Gen surgery consulted pharmacy plan is home on Levaquin Starting it today , monitor temp    ROS: Patient denies CP, SOB,  N/V/D    Objective:   No results found.      Recent Labs    09/01/24 0627  WBC 6.9  HGB 9.5*  HCT 31.0*  PLT 433*    Recent Labs    09/01/24 0627  NA 138  K 4.0  CL 110  CO2 17*  GLUCOSE 202*  BUN 22  CREATININE 1.32*  CALCIUM  8.1*     Intake/Output Summary (Last 24 hours) at 09/02/2024 0915 Last data filed at 09/02/2024 0645 Gross per 24 hour  Intake 17176.35 ml  Output 230 ml  Net 16946.35 ml        Physical Exam: Vital Signs Blood pressure (!) 146/60, pulse 91, temperature 97.6 F (36.4 C), temperature source Oral, resp. rate 18, height 5' 8 (1.727 m), weight 110 kg, SpO2 99%.    General: No acute distress Mood and affect are appropriate Heart: Regular rate and rhythm no rubs murmurs or extra sounds Lungs: Clear to auscultation, breathing unlabored, no rales or wheezes Abdomen: Positive bowel sounds, soft nontender to palpation, nondistended Extremities: No clubbing, cyanosis, or edema Skin: No evidence of breakdown, no evidence of rash   Skin:  Midline incision clean Neuro: AAOx4. No apparent cognitive deficits    Sensory exam: revealed normal sensation in all dermatomal regions in bilateral upper extremities and bilateral lower extremities Motor exam: strength 5-/5 throughout bilateral upper extremities and bilateral lower extremities Coordination: Fine motor coordination was normal.        Assessment/Plan: 1. Functional deficits which require 3+ hours per day of interdisciplinary therapy in a comprehensive inpatient rehab setting. Physiatrist is providing close team supervision and 24 hour management of active medical problems listed below. Physiatrist and rehab team continue to assess barriers  to discharge/monitor patient progress toward functional and medical goals  Care Tool:  Bathing    Body parts bathed by patient: Right arm, Left arm, Chest, Abdomen, Front perineal area, Buttocks, Right upper leg, Left upper leg, Face     Body parts n/a: Right lower leg, Left lower leg   Bathing assist Assist Level: Minimal Assistance - Patient > 75%     Upper Body Dressing/Undressing Upper body dressing   What is the patient wearing?: Pull over shirt    Upper body assist Assist Level: Set up assist    Lower Body Dressing/Undressing Lower body dressing      What is the patient wearing?: Pants     Lower body assist Assist for lower body dressing: Moderate Assistance - Patient 50 - 74%     Toileting Toileting    Toileting assist Assist for toileting: Total Assistance - Patient < 25%     Transfers Chair/bed transfer  Transfers assist  Chair/bed transfer activity did not occur: Safety/medical concerns (2/2 high fatigue)  Chair/bed transfer assist level: Contact Guard/Touching assist     Locomotion Ambulation   Ambulation assist   Ambulation activity did not occur: Safety/medical concerns (2/2 high fatigue)  Assist level: Contact Guard/Touching assist Assistive  device: Walker-rolling Max distance: 75   Walk 10 feet activity   Assist  Walk 10 feet activity did not occur: Safety/medical concerns (2/2 high fatigue)  Assist level: Contact Guard/Touching assist Assistive device: Walker-rolling   Walk 50 feet activity   Assist Walk 50 feet with 2 turns activity did not occur: Safety/medical concerns (2/2 high fatigue)  Assist level: Contact Guard/Touching assist Assistive device: Walker-rolling    Walk 150 feet activity   Assist Walk 150 feet activity did not occur: Safety/medical concerns         Walk 10 feet on uneven surface  activity   Assist Walk 10 feet on uneven surfaces activity did not occur: Safety/medical concerns (2/2 high  fatigue)         Wheelchair     Assist Is the patient using a wheelchair?: Yes Type of Wheelchair: Manual Wheelchair activity did not occur: Safety/medical concerns (2/2 high fatigue)         Wheelchair 50 feet with 2 turns activity    Assist    Wheelchair 50 feet with 2 turns activity did not occur: Safety/medical concerns (2/2 high fatigue)       Wheelchair 150 feet activity     Assist  Wheelchair 150 feet activity did not occur: Safety/medical concerns (2/2 high fatigue)       Blood pressure (!) 146/60, pulse 91, temperature 97.6 F (36.4 C), temperature source Oral, resp. rate 18, height 5' 8 (1.727 m), weight 110 kg, SpO2 99%.   Medical Problem List and Plan: 1. 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  Antithrombotics: -DVT/anticoagulation:  Pharmaceutical: Lovenox              -antiplatelet therapy: N/A 3. Pain Management: Tylenol  prn.  .    Pain improving --uses tramadol  on occasion 4. Mood/Behavior/Sleep: LCSW to follow for evaluation and support.              -antipsychotic agents: N/A 5. Neuropsych/cognition: This patient is capable of making decisions on her own behalf. 6. Skin/Wound Care: Monitor wound for healing.  Still with 2 lines as well as J-tube some tenderness around the J-tube but no drainage  -can't drain fluid collection at distal pancreas per IR  10/19 continue lines per surgery who's following 7. Fluids/Electrolytes/Nutrition: Monitor I/O. Continue tube feeds    Latest Ref Rng & Units 09/01/2024    6:27 AM 08/30/2024    5:12 AM 08/29/2024    6:22 AM  BMP  Glucose 70 - 99 mg/dL 797  716  710   BUN 8 - 23 mg/dL 22  19  22    Creatinine 0.44 - 1.00 mg/dL 8.67  8.73  8.87   Sodium 135 - 145 mmol/L 138  136  134    Potassium 3.5 - 5.1 mmol/L 4.0  4.1  5.7   Chloride 98 - 111 mmol/L 110  108  108   CO2 22 - 32 mmol/L 17  17  12    Calcium  8.9 - 10.3 mg/dL 8.1  7.9  7.9      Requires tube feeds via J tube due to gastric outlet obstruction , anticipate need for months to improve nutrition for wound healing  8. Duodenal  adenocarcinoma w/gastric outlet obst:  continues on tube feed for nutritional support.  Diarrhea due to TF, although imaging looked like some residual ileus 9. Abdominal distension:  improve motility lass stomach gas, some mild ileus noted per Gen surg  only ~50mg  tramadol  per day     Chest pain resolved off of p.o. feeds 10. Fluid overload/Pleural effusions: In part due to third spacing. S/p thoracocentesis.  Right side improved , left side    11. HTN: Monitor BP TID--on amlodipine , catapres  and coreg          Mild elevation monitor on current meds avoid diuretics due to hydration status Vitals:   09/01/24 2230 09/02/24 0353  BP: (!) 158/83 (!) 146/60  Pulse:  91  Resp:  18  Temp:  97.6 F (36.4 C)  SpO2:  99%   12.  Prediabetes: Hgb A1c- 6.1. Elevated BS due to tube feeds             --continue Lantus  with meal coverage and SSI             --monitor BS ac/hs and use SSI for elevated BS   CBG (last 3)  Recent Labs    09/01/24 1655 09/01/24 1810 09/02/24 0640  GLUCAP 66* 134* 199*  Increase lantus  10/12 to 20U, minimal po intake , no change in TF since 10/9 Of change Lantus  to 35 units nightly -10/19 likely will need to make changes but with diet resumed will observe for pattern Increase lantus  to 40U   13. Acute on chronic kidney disease: SCr 1.3 at admission and now WNL 14.  Obesity  Class 2: BMI 38.47 15.  ABLA: Recheck CBC in am. Hgb 8-9 range.   Stable 10/17    Latest Ref Rng & Units 09/01/2024    6:27 AM 08/29/2024    6:22 AM 08/28/2024    5:36 AM  CBC  WBC 4.0 - 10.5 K/uL 6.9  7.4  12.3   Hemoglobin 12.0 - 15.0 g/dL 9.5  9.7  8.5   Hematocrit 36.0 - 46.0  % 31.0  32.0  27.6   Platelets 150 - 400 K/uL 433  377  410     16. Severe protein malnutrition- J tube placed 9/9 Albumin  of 1.5- is declining-    17. Thrush  Resolved after nystatin  and diflucan   Ask dietary to follow oral vs enteral intake  18.  Abd abscess with Leukocytosis resolved after initiation of IV Zosyn  10/20- as per cultures and pharmacy rec switched to meropenem,  Now switched to levaquin- monitor response, hope to d/c home on this med rather than IV abx hope to know by tomorrow   LOS: 18 days A FACE TO FACE EVALUATION WAS PERFORMED  Lauren Gray 09/02/2024, 9:15 AM

## 2024-09-02 NOTE — Progress Notes (Addendum)
 Nutrition Education Note  Conducted education session on use of RELiZORB cartridges. RELiZORB is a lipase enzyme product that breaks down fats in enteral formulas for continuous feeds. Pt/pt's family present for education session. Explained how each cartridge covers 500 ml of tube feeding formula and explained pt's recommended cartridge use for formula regimen. Discussed that medications cannot flow through cartridges and must be pushed below RELiZORB cartridges.  Pt's discharge regimen recommendations: Total formula per 14 hr nocturnal feeds: 1330 ml of Nutren 1.5  Number of cartridges needed: 3 per day Timing of cartridge changes: 6pm (1 cartridge) and 10pm (2 cartridges in tandem)  Used demo set up to explain how to set up RELiZORB cartridges. Pt and pt's family practiced set up and demonstrated understanding using teach back method. Educated Charity fundraiser on set up and use as well.   Pt and family's level of understanding adequate for discharge.  Please reach out to RD with any further questions about RELiZORB use or set up.     Josette Glance, MS, RDN, LDN Clinical Dietitian I Please reach out via secure chat

## 2024-09-03 ENCOUNTER — Other Ambulatory Visit (HOSPITAL_COMMUNITY): Payer: Self-pay

## 2024-09-03 LAB — CBC
HCT: 30.4 % — ABNORMAL LOW (ref 36.0–46.0)
Hemoglobin: 9.1 g/dL — ABNORMAL LOW (ref 12.0–15.0)
MCH: 26.8 pg (ref 26.0–34.0)
MCHC: 29.9 g/dL — ABNORMAL LOW (ref 30.0–36.0)
MCV: 89.4 fL (ref 80.0–100.0)
Platelets: 493 K/uL — ABNORMAL HIGH (ref 150–400)
RBC: 3.4 MIL/uL — ABNORMAL LOW (ref 3.87–5.11)
RDW: 16 % — ABNORMAL HIGH (ref 11.5–15.5)
WBC: 8.7 K/uL (ref 4.0–10.5)
nRBC: 0 % (ref 0.0–0.2)

## 2024-09-03 LAB — BASIC METABOLIC PANEL WITH GFR
Anion gap: 11 (ref 5–15)
BUN: 22 mg/dL (ref 8–23)
CO2: 16 mmol/L — ABNORMAL LOW (ref 22–32)
Calcium: 8.3 mg/dL — ABNORMAL LOW (ref 8.9–10.3)
Chloride: 112 mmol/L — ABNORMAL HIGH (ref 98–111)
Creatinine, Ser: 1.2 mg/dL — ABNORMAL HIGH (ref 0.44–1.00)
GFR, Estimated: 48 mL/min — ABNORMAL LOW (ref >60)
Glucose, Bld: 122 mg/dL — ABNORMAL HIGH (ref 70–99)
Potassium: 4.4 mmol/L (ref 3.5–5.1)
Sodium: 139 mmol/L (ref 135–145)

## 2024-09-03 LAB — GLUCOSE, CAPILLARY
Glucose-Capillary: 119 mg/dL — ABNORMAL HIGH (ref 70–99)
Glucose-Capillary: 164 mg/dL — ABNORMAL HIGH (ref 70–99)
Glucose-Capillary: 92 mg/dL (ref 70–99)
Glucose-Capillary: 117 mg/dL — ABNORMAL HIGH (ref 70–99)

## 2024-09-03 MED ORDER — MEGESTROL ACETATE 400 MG/10ML PO SUSP
400.0000 mg | Freq: Every day | ORAL | 0 refills | Status: DC
  Filled 2024-09-03: qty 320, 32d supply, fill #0

## 2024-09-03 MED ORDER — CARVEDILOL 6.25 MG PO TABS
12.5000 mg | ORAL_TABLET | Freq: Two times a day (BID) | ORAL | 0 refills | Status: DC
  Filled 2024-09-03: qty 60, 15d supply, fill #0

## 2024-09-03 MED ORDER — LEVOFLOXACIN 750 MG PO TABS
750.0000 mg | ORAL_TABLET | Freq: Every day | ORAL | 0 refills | Status: DC
  Filled 2024-09-03: qty 8, 8d supply, fill #0

## 2024-09-03 MED ORDER — TRAMADOL HCL 50 MG PO TABS
50.0000 mg | ORAL_TABLET | Freq: Four times a day (QID) | ORAL | 0 refills | Status: DC | PRN
  Filled 2024-09-03: qty 30, 8d supply, fill #0

## 2024-09-03 MED ORDER — FREE WATER
150.0000 mL | Freq: Three times a day (TID) | Status: DC
  Administered 2024-09-03 – 2024-09-04 (×2): 150 mL

## 2024-09-03 MED ORDER — LOPERAMIDE HCL 2 MG PO CAPS
4.0000 mg | ORAL_CAPSULE | Freq: Once | ORAL | Status: AC
  Administered 2024-09-03: 4 mg via ORAL
  Filled 2024-09-03: qty 2

## 2024-09-03 MED ORDER — PANTOPRAZOLE SODIUM 40 MG PO TBEC
40.0000 mg | DELAYED_RELEASE_TABLET | Freq: Two times a day (BID) | ORAL | 0 refills | Status: DC
  Filled 2024-09-03: qty 60, 30d supply, fill #0

## 2024-09-03 MED ORDER — FREE WATER: 100.0000 mL | Freq: Four times a day (QID) | Status: DC

## 2024-09-03 MED ORDER — ACCU-CHEK SOFTCLIX LANCETS MISC
5 refills | Status: DC
  Filled 2024-09-03: qty 100, 25d supply, fill #0

## 2024-09-03 MED ORDER — PROCHLORPERAZINE MALEATE 5 MG PO TABS
5.0000 mg | ORAL_TABLET | Freq: Four times a day (QID) | ORAL | 0 refills | Status: DC | PRN
  Filled 2024-09-03: qty 30, 4d supply, fill #0

## 2024-09-03 MED ORDER — PANCRELIPASE (LIP-PROT-AMYL) 12000-38000 UNITS PO CPEP
24000.0000 [IU] | ORAL_CAPSULE | Freq: Three times a day (TID) | ORAL | 0 refills | Status: DC
  Filled 2024-09-03: qty 200, 34d supply, fill #0

## 2024-09-03 MED ORDER — FREE WATER: 150.0000 mL | Freq: Three times a day (TID) | Status: DC

## 2024-09-03 MED ORDER — FLUTICASONE PROPIONATE 50 MCG/ACT NA SUSP
2.0000 | Freq: Every day | NASAL | 0 refills | Status: DC
  Filled 2024-09-03: qty 16, 30d supply, fill #0

## 2024-09-03 MED ORDER — TRAZODONE HCL 50 MG PO TABS
50.0000 mg | ORAL_TABLET | Freq: Every day | ORAL | 0 refills | Status: DC
  Filled 2024-09-03: qty 30, 30d supply, fill #0

## 2024-09-03 MED ORDER — LANTUS SOLOSTAR 100 UNIT/ML ~~LOC~~ SOPN
32.0000 [IU] | PEN_INJECTOR | Freq: Every day | SUBCUTANEOUS | 0 refills | Status: DC
  Filled 2024-09-03: qty 15, 46d supply, fill #0

## 2024-09-03 MED ORDER — LOPERAMIDE HCL 2 MG PO CAPS
2.0000 mg | ORAL_CAPSULE | Freq: Four times a day (QID) | ORAL | Status: DC | PRN
  Administered 2024-09-03 – 2024-09-04 (×2): 2 mg via ORAL
  Filled 2024-09-03 (×2): qty 1

## 2024-09-03 MED ORDER — INSULIN PEN NEEDLE 32G X 4 MM MISC
1.0000 | Freq: Two times a day (BID) | 0 refills | Status: DC
  Filled 2024-09-03: qty 100, 50d supply, fill #0

## 2024-09-03 MED ORDER — CARVEDILOL 6.25 MG PO TABS
6.2500 mg | ORAL_TABLET | Freq: Two times a day (BID) | ORAL | 0 refills | Status: DC
  Filled 2024-09-03: qty 60, 30d supply, fill #0

## 2024-09-03 MED ORDER — ACCU-CHEK GUIDE W/DEVICE KIT
1.0000 | PACK | 0 refills | Status: DC
  Filled 2024-09-03: qty 1, 30d supply, fill #0

## 2024-09-03 MED ORDER — ACETAMINOPHEN 325 MG PO TABS: 325.0000 mg | ORAL_TABLET | ORAL | Status: DC | PRN

## 2024-09-03 MED ORDER — SODIUM CHLORIDE 0.9% FLUSH
10.0000 mL | Freq: Every day | INTRAVENOUS | 0 refills | Status: DC
  Filled 2024-09-03: qty 300, 30d supply, fill #0

## 2024-09-03 MED ORDER — METHOCARBAMOL 500 MG PO TABS
500.0000 mg | ORAL_TABLET | Freq: Three times a day (TID) | ORAL | 0 refills | Status: DC
  Filled 2024-09-03: qty 120, 40d supply, fill #0

## 2024-09-03 MED ORDER — INSULIN ASPART 100 UNIT/ML FLEXPEN
6.0000 [IU] | PEN_INJECTOR | Freq: Three times a day (TID) | SUBCUTANEOUS | 0 refills | Status: DC
  Filled 2024-09-03: qty 15, 83d supply, fill #0

## 2024-09-03 MED ORDER — SODIUM CHLORIDE 0.9% FLUSH
10.0000 mL | Freq: Every day | INTRAVENOUS | 0 refills | Status: DC
  Filled 2024-09-03: qty 600, 60d supply, fill #0

## 2024-09-03 MED ORDER — LANCET DEVICE MISC
1.0000 | 0 refills | Status: DC
  Filled 2024-09-03: qty 1, fill #0

## 2024-09-03 MED ORDER — LEVOFLOXACIN 750 MG PO TABS
750.0000 mg | ORAL_TABLET | Freq: Every day | ORAL | 0 refills | Status: DC
  Filled 2024-09-03: qty 10, 10d supply, fill #0

## 2024-09-03 MED ORDER — LOPERAMIDE HCL 2 MG PO CAPS
2.0000 mg | ORAL_CAPSULE | Freq: Four times a day (QID) | ORAL | 0 refills | Status: DC | PRN
  Filled 2024-09-03: qty 30, 8d supply, fill #0

## 2024-09-03 MED ORDER — KATE FARMS STANDARD 1.4 EN LIQD: ENTERAL | Status: DC

## 2024-09-03 MED ORDER — LANCETS MISC
1.0000 | 0 refills | Status: DC
  Filled 2024-09-03: qty 100, fill #0

## 2024-09-03 MED ORDER — BLOOD GLUCOSE TEST VI STRP
1.0000 | ORAL_STRIP | 0 refills | Status: DC
  Filled 2024-09-03: qty 100, 25d supply, fill #0

## 2024-09-03 MED ORDER — AMLODIPINE BESYLATE 10 MG PO TABS
10.0000 mg | ORAL_TABLET | Freq: Every day | ORAL | 0 refills | Status: DC
  Filled 2024-09-03: qty 30, 30d supply, fill #0

## 2024-09-03 MED ORDER — GABAPENTIN 250 MG/5ML PO SOLN
100.0000 mg | Freq: Three times a day (TID) | ORAL | 0 refills | Status: DC
  Filled 2024-09-03: qty 180, 30d supply, fill #0

## 2024-09-03 MED ORDER — LOPERAMIDE HCL 2 MG PO CAPS: 4.0000 mg | ORAL_CAPSULE | Freq: Three times a day (TID) | ORAL | Status: DC | PRN

## 2024-09-03 MED ORDER — BLOOD GLUCOSE TEST STRIPS 333 VI STRP
1.0000 | ORAL_STRIP | Freq: Three times a day (TID) | 0 refills | Status: DC
  Filled 2024-09-03: qty 200, fill #0

## 2024-09-03 MED ORDER — RELIZORB DEVI: 1.0000 | Freq: Three times a day (TID) | Status: DC

## 2024-09-03 MED ORDER — CLONIDINE HCL 0.1 MG PO TABS
0.1000 mg | ORAL_TABLET | Freq: Two times a day (BID) | ORAL | 0 refills | Status: DC
  Filled 2024-09-03: qty 60, 30d supply, fill #0

## 2024-09-03 NOTE — Progress Notes (Signed)
 PROGRESS NOTE    Subjective/Complaints:  Appreciate Gen Surg note as well as IR note  Less pain around J tube  Discussed D/C process and need for f/u with Dr Aron  ROS: Patient denies CP, SOB,  N/V/D    Objective:   No results found.      Recent Labs    09/01/24 0627  WBC 6.9  HGB 9.5*  HCT 31.0*  PLT 433*    Recent Labs    09/01/24 0627  NA 138  K 4.0  CL 110  CO2 17*  GLUCOSE 202*  BUN 22  CREATININE 1.32*  CALCIUM  8.1*     Intake/Output Summary (Last 24 hours) at 09/03/2024 0848 Last data filed at 09/03/2024 9364 Gross per 24 hour  Intake 1035 ml  Output 90 ml  Net 945 ml        Physical Exam: Vital Signs Blood pressure 130/62, pulse 83, temperature 98.2 F (36.8 C), temperature source Oral, resp. rate 18, height 5' 8 (1.727 m), weight 112 kg, SpO2 100%.    General: No acute distress Mood and affect are appropriate Heart: Regular rate and rhythm no rubs murmurs or extra sounds Lungs: Clear to auscultation, breathing unlabored, no rales or wheezes Abdomen: Positive bowel sounds, soft nontender to palpation, nondistended Extremities: No clubbing, cyanosis, or edema Skin: No evidence of breakdown, no evidence of rash   Skin:  Midline incision clean Neuro: AAOx4. No apparent cognitive deficits    Sensory exam: revealed normal sensation in all dermatomal regions in bilateral upper extremities and bilateral lower extremities Motor exam: strength 5-/5 throughout bilateral upper extremities and bilateral lower extremities Coordination: Fine motor coordination was normal.        Assessment/Plan: 1. Functional deficits which require 3+ hours per day of interdisciplinary therapy in a comprehensive inpatient rehab setting. Physiatrist is providing close team supervision and 24 hour management of active medical problems listed below. Physiatrist and rehab team continue to assess  barriers to discharge/monitor patient progress toward functional and medical goals  Care Tool:  Bathing    Body parts bathed by patient: Right arm, Left arm, Chest, Abdomen, Front perineal area, Buttocks, Right upper leg, Left upper leg, Face     Body parts n/a: Right lower leg, Left lower leg   Bathing assist Assist Level: Minimal Assistance - Patient > 75%     Upper Body Dressing/Undressing Upper body dressing   What is the patient wearing?: Pull over shirt    Upper body assist Assist Level: Set up assist    Lower Body Dressing/Undressing Lower body dressing      What is the patient wearing?: Pants     Lower body assist Assist for lower body dressing: Moderate Assistance - Patient 50 - 74%     Toileting Toileting    Toileting assist Assist for toileting: Total Assistance - Patient < 25%     Transfers Chair/bed transfer  Transfers assist  Chair/bed transfer activity did not occur: Safety/medical concerns (2/2 high fatigue)  Chair/bed transfer assist level: Contact Guard/Touching assist     Locomotion Ambulation   Ambulation assist   Ambulation activity did not occur: Safety/medical concerns (2/2 high fatigue)  Assist level: Contact Guard/Touching assist Assistive device: Walker-rolling Max distance: 75   Walk 10 feet activity   Assist  Walk 10 feet activity did not occur: Safety/medical concerns (2/2 high fatigue)  Assist level: Contact Guard/Touching assist Assistive device: Walker-rolling   Walk 50 feet activity   Assist Walk 50 feet with 2 turns activity did not occur: Safety/medical concerns (2/2 high fatigue)  Assist level: Contact Guard/Touching assist Assistive device: Walker-rolling    Walk 150 feet activity   Assist Walk 150 feet activity did not occur: Safety/medical concerns         Walk 10 feet on uneven surface  activity   Assist Walk 10 feet on uneven surfaces activity did not occur: Safety/medical concerns (2/2  high fatigue)         Wheelchair     Assist Is the patient using a wheelchair?: Yes Type of Wheelchair: Manual Wheelchair activity did not occur: Safety/medical concerns (2/2 high fatigue)         Wheelchair 50 feet with 2 turns activity    Assist    Wheelchair 50 feet with 2 turns activity did not occur: Safety/medical concerns (2/2 high fatigue)       Wheelchair 150 feet activity     Assist  Wheelchair 150 feet activity did not occur: Safety/medical concerns (2/2 high fatigue)       Blood pressure 130/62, pulse 83, temperature 98.2 F (36.8 C), temperature source Oral, resp. rate 18, height 5' 8 (1.727 m), weight 112 kg, SpO2 100%.   Medical Problem List and Plan: 1. 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  Antithrombotics: -DVT/anticoagulation:  Pharmaceutical: Lovenox              -antiplatelet therapy: N/A 3. Pain Management: Tylenol  prn.  .    Pain improving --uses tramadol  on occasion 4. Mood/Behavior/Sleep: LCSW to follow for evaluation and support.              -antipsychotic agents: N/A 5. Neuropsych/cognition: This patient is capable of making decisions on her own behalf. 6. Skin/Wound Care: Monitor wound for healing.  Still with 2 lines as well as J-tube some tenderness around the J-tube but no drainage  -can't drain fluid collection at distal pancreas per IR  IR to check possible dye injection  7. Fluids/Electrolytes/Nutrition: Monitor I/O. Continue tube feeds    Latest Ref Rng & Units 09/01/2024    6:27 AM 08/30/2024    5:12 AM 08/29/2024    6:22 AM  BMP  Glucose 70 - 99 mg/dL 797  716  710   BUN 8 - 23 mg/dL 22  19  22    Creatinine 0.44 - 1.00 mg/dL 8.67  8.73  8.87   Sodium 135 - 145 mmol/L 138  136  134   Potassium 3.5 -  5.1 mmol/L 4.0  4.1  5.7   Chloride 98 - 111 mmol/L 110  108  108   CO2 22 - 32 mmol/L 17  17  12    Calcium  8.9 - 10.3 mg/dL 8.1  7.9  7.9      Requires tube feeds via J tube due to gastric outlet obstruction , anticipate need for months to improve nutrition for  wound healing  8. Duodenal adenocarcinoma w/gastric outlet obst:  continues on tube feed for nutritional support.  Diarrhea due to TF, although imaging looked like some residual ileus 9. Abdominal distension:  improve motility lass stomach gas, some mild ileus noted per Gen surg  only ~50mg  tramadol  per day     Chest pain resolved off of p.o. feeds 10. Fluid overload/Pleural effusions: In part due to third spacing. S/p thoracocentesis.  Right side improved , left side    11. HTN: Monitor BP TID--on amlodipine , catapres  and coreg          Mild elevation monitor on current meds avoid diuretics due to hydration status Vitals:   09/03/24 0500 09/03/24 0823  BP: 132/63 130/62  Pulse: 86 83  Resp: 18   Temp: 98.2 F (36.8 C)   SpO2: 100%    12.  Prediabetes: Hgb A1c- 6.1. Elevated BS due to tube feeds             --continue Lantus  with meal coverage and SSI             --monitor BS ac/hs and use SSI for elevated BS   CBG (last 3)  Recent Labs    09/02/24 1118 09/02/24 2143 09/03/24 0634  GLUCAP 184* 153* 119*   Lantus  to 35 units nightly    13. Acute on chronic kidney disease: SCr 1.3 at admission and now WNL 14.  Obesity  Class 2: BMI 38.47 15.  ABLA: Recheck CBC in am. Hgb 8-9 range.   Stable 10/20    Latest Ref Rng & Units 09/01/2024    6:27 AM 08/29/2024    6:22 AM 08/28/2024    5:36 AM  CBC  WBC 4.0 - 10.5 K/uL 6.9  7.4  12.3   Hemoglobin 12.0 - 15.0 g/dL 9.5  9.7  8.5   Hematocrit 36.0 - 46.0 % 31.0  32.0  27.6   Platelets 150 - 400 K/uL 433  377  410     16. Severe protein malnutrition- J tube placed 9/9 Albumin  of 1.5- is declining-    17. Thrush  Resolved after nystatin  and diflucan   Ask dietary  to follow oral vs enteral intake  18.  Abd abscess with Leukocytosis resolved after initiation of IV Zosyn  10/20- as per cultures and pharmacy rec switched to meropenem,  Now switched to levaquin- monitor response, hope to d/c home on this med rather than IV abx hope to know by tomorrow   Has CBC in am will check WBCs LOS: 19 days A FACE TO FACE EVALUATION WAS PERFORMED  Lauren Gray 09/03/2024, 8:48 AM

## 2024-09-03 NOTE — Patient Care Conference (Signed)
 Inpatient RehabilitationTeam Conference and Plan of Care Update Date: 09/03/24   Time: 10:13 AM    Patient Name: Lauren Gray      Medical Record Number: [id number]  Date of Birth: 1951/08/16 Sex: Female         Room/Bed: 4W20C/4W20C-01 Payor Info: Payor: [employer] / Plan: [facility] / Product Type: *No Product type* /    Admit Date/Time:  08/15/2024  8:31 PM  Primary Diagnosis:  Debility  Hospital Problems: Principal Problem:   Debility Active Problems:   Prediabetes   AKI (acute kidney injury)   Adjustment disorder with depressed mood   Malnutrition of moderate degree   Pancreatic cancer (HCC)   Postoperative intra-abdominal abscess (HCC)--cultures positive for Klebsiella and Pseudomonas    Expected Discharge Date: Expected Discharge Date: 09/04/24  Team Members Present: Physician leading conference: Dr. Prentice Compton Social Worker Present: Rhoda Clement, LCSW Nurse Present: Barnie Ronde, RN PT Present: Delinda Bertrand, PTA;Sherlean Perks, PT OT Present: Julia Saguier, OT SLP Present: Blaise Alderman, SLP PPS Coordinator present : Eleanor Colon, SLP     Current Status/Progress Goal Weekly Team Focus  Bowel/Bladder   Pt is continent of Bladder and bowel   Pt will remain continent of bladder and bowel   Pt provided assistance with tolieting Q 4 hrs    Swallow/Nutrition/ Hydration               ADL's   goals met, pt ready for discharge to home   supervision with self care (goals downgraded from admission)   ready for discharge    Mobility   bed mobility with CGA/supervision. CGA- supervision ambulation limited by fatigue. stairs CGA with 2 hand rails, unable to trial 1 hand rail or >4 stairs d/t fatigue   Supervision  endurance, stair training    Communication                Safety/Cognition/ Behavioral Observations               Pain   Abdominal incision midline lower ABD 2 JP drains Right side of abdomen   Skin will remain free  from s.sx of infection or breakdown   Assess skin Q shift and prn    Skin      JP drain and J-tube   Skin free of breakdown    Assess skin q shift    Discharge Planning:  Working toward discharege for tomorrow. [employer]orb pateint enrollment application completed-DME in place and Home Health in place. Family training with husband and daughter on-going. 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 Complicated by pleural effusions, anemia and pna; continue with JP drain x 2. Functional improvement limited by eccentric and self limiting behaviors with decreased activity tolerance and pain.  Patient on target to meet rehab goals: yes, currently at goal level. Complete sit - stand tranfers with supervision. Able to manage steps with CGA.  *See Care Plan and progress notes for long and short-term goals.   Revisions to Treatment Plan:  ReliZorb added for tube feedings   Teaching Needs: Safety, medications, transfers, toileting, etc. Tube feedings, J- tube management, JP drain management/flushing   Current Barriers to Discharge: Decreased caregiver support, Home enviroment access/layout, and Nutritional means  Possible Resolutions to Barriers: Family education HH follow up services DME: RW  Medical Summary Current Status: Has converted to p.o. antibiotics, abdominal drain reinserted last week, pain overall improving around the J-tube site.  Barriers to Discharge: Morbid Obesity;Inadequate Nutritional Intake   Possible Resolutions to [employer] Focus: Remains on J-tube feeds with minimal p.o. intake, diarrhea due to tube feeds, initiating Relizorb   Continued Need for Acute Rehabilitation Level of Care: The patient requires daily medical management by a physician with specialized training in  physical medicine and rehabilitation for the following reasons: Direction of a multidisciplinary physical rehabilitation program to maximize functional independence : Yes Medical management of patient stability for increased activity during participation in an intensive rehabilitation regime.: Yes Analysis of laboratory values and/or radiology reports with any subsequent need for medication adjustment and/or medical intervention. : Yes   I attest that I was present, lead the team conference, and concur with the assessment and plan of the team.   Fredericka Sober B 09/04/2024, 8:07 PM

## 2024-09-03 NOTE — Discharge Instructions (Addendum)
 Inpatient Rehab Discharge Instructions  Lauren Gray Discharge date and time: 09/04/24   Activities/Precautions/ Functional Status: Activity: no lifting, driving, or strenuous exercise till cleared by MD Diet: regular diet Wound Care: keep wound clean and dry   Functional status:  ___ No restrictions     ___ Walk up steps independently _X__ 24/7 supervision/assistance   ___ Walk up steps with assistance ___ Intermittent supervision/assistance  ___ Bathe/dress independently ___ Walk with walker     _X__ Bathe/dress with assistance ___ Walk Independently    ___ Shower independently ___ Walk with assistance    ___ Shower with assistance _X__ No alcohol     ___ Return to work/school ________   Special Instructions: Monitor blood sugars before meals and at bedtime 2.  Flush drain with 5 cc sterile saline. Document output 3. Tube feed for 10 hours at night.   COMMUNITY REFERRALS UPON DISCHARGE:    Home Health:   PT     OT      RN                Agency:[facility]      Phone: [phone]   Medical Equipment/Items Ordered:wheelchair, rolling walker and drop-arm bedside commode                                                 Agency/Supplier:[facility]   [phone]  [facility]-SUPPORT PERSON-MARTA DEVINE [phone]  QUESTIONS  My questions have been answered and I understand these instructions. I will adhere to these goals and the provided educational materials after my discharge from the hospital.  Patient/Caregiver Signature _______________________________ Date __________  Clinician Signature _______________________________________ Date __________  Please bring this form and your medication list with you to all your follow-up doctor's appointments.

## 2024-09-03 NOTE — Progress Notes (Signed)
 Patient ID: ADASYN MCADAMS, female   DOB: [birth year], 73 y.o.   MRN: [id number]    Referring Physician(s): Dr. Jina Nephew   Supervising Physician: Vanice Revel  Patient Status:  [facility] - In-pt  Chief Complaint:  Intra-abdominal abscess; s/p RUQ drain placement 08/26/24 with Dr. Hughes  Subjective:  Pt doing well. No pain around RUQ drain, very mild pain reported around J tube site. Has been able to tolerate PO intake, liquids better than solids.  Per hospitalist today, plan for discharge home tomorrow.  Allergies: Codeine and Latex  Medications: Prior to Admission medications   Medication Sig Start Date End Date Taking? Authorizing Provider  sodium chloride  flush (NS) 0.9 % SOLN Please flush drain once per day with 5 mL. 09/03/24 11/02/24 Yes Kitiara Hintze H, PA-C  amLODipine  (NORVASC ) 10 MG tablet Take 1 tablet (10 mg total) by mouth daily. 08/16/24   Danford, Lonni SHAUNNA, MD  carvedilol (COREG) 6.25 MG tablet Take 2 tablets (12.5 mg total) by mouth 2 (two) times daily with a meal. 08/15/24   Danford, Lonni SHAUNNA, MD  fiber supplement, BANATROL TF, liquid Place 60 mLs into feeding tube 2 (two) times daily. 08/15/24   Danford, Lonni SHAUNNA, MD  gabapentin  (NEURONTIN ) 250 MG/5ML solution Place 2 mLs (100 mg total) into feeding tube every 8 (eight) hours. 08/15/24   Danford, Lonni SHAUNNA, MD  HYDROcodone-acetaminophen  (NORCO/VICODIN) 5-325 MG tablet Take 1-2 tablets by mouth every 6 (six) hours as needed for severe pain (pain score 7-10). 08/15/24   Danford, Lonni SHAUNNA, MD  insulin  aspart (NOVOLOG ) 100 UNIT/ML injection Inject 0-15 Units into the skin 3 (three) times daily with meals. 08/15/24   Danford, Lonni SHAUNNA, MD  insulin  glargine (LANTUS ) 100 UNIT/ML injection Inject 0.2 mLs (20 Units total) into the skin daily. 08/15/24   Danford, Lonni SHAUNNA, MD  lipase/protease/amylase 24000-76000 units CPEP Take 1 capsule (24,000 Units total) by mouth 3 (three) times daily before  meals. 08/15/24   Danford, Lonni SHAUNNA, MD  methocarbamol  (ROBAXIN ) 1000 MG/10ML injection Inject 5 mLs (500 mg total) into the vein every 8 (eight) hours. 08/15/24   Danford, Lonni SHAUNNA, MD  Morphine  Sulfate (MORPHINE , PF,) 2 MG/ML injection Inject 1 mL (2 mg total) into the vein every 2 (two) hours as needed (Intractable pain not controlled by oral medication). 08/15/24   Danford, Lonni SHAUNNA, MD  Nutritional Supplements (FEEDING SUPPLEMENT, OSMOLITE 1.5 CAL,) LIQD Place 1,260 mLs into feeding tube daily. 08/16/24   Danford, Lonni SHAUNNA, MD  ondansetron  (ZOFRAN ) 4 MG/2ML SOLN injection Inject 2 mLs (4 mg total) into the vein every 4 (four) hours as needed for nausea or vomiting. 08/15/24   Danford, Lonni SHAUNNA, MD  pantoprazole  (PROTONIX ) 40 MG tablet Take 1 tablet (40 mg total) by mouth daily. 08/15/24   Danford, Lonni SHAUNNA, MD  Protein (FEEDING SUPPLEMENT, PROSOURCE TF20,) liquid Place 60 mLs into feeding tube daily. 08/16/24   Danford, Lonni SHAUNNA, MD  sodium chloride  0.9 % SOLN 50 mL with promethazine  25 MG/ML SOLN 12.5 mg Inject 12.5 mg into the vein every 6 (six) hours as needed. 08/15/24   Danford, Lonni SHAUNNA, MD  traMADol  (ULTRAM ) 50 MG tablet Place 1 tablet (50 mg total) into feeding tube every 12 (twelve) hours as needed for moderate pain (pain score 4-6). 08/15/24   Danford, Lonni SHAUNNA, MD  traZODone  (DESYREL ) 50 MG tablet Place 1 tablet (50 mg total) into feeding tube at bedtime. 08/15/24   Danford, Lonni SHAUNNA, MD  Water  For Irrigation,  Sterile (FREE WATER ) SOLN Place 100 mLs into feeding tube every 8 (eight) hours. 08/15/24   Jonel Lonni SQUIBB, MD     Vital Signs: BP 130/62   Pulse 83   Temp 98.2 F (36.8 C) (Oral)   Resp 18   Ht 5' 8 (1.727 m)   Wt 246 lb 14.6 oz (112 kg)   SpO2 100%   BMI 37.54 kg/m   Physical Exam Vitals and nursing note reviewed.  Cardiovascular:     Rate and Rhythm: Normal rate.  Pulmonary:     Effort: Pulmonary effort is normal.   Abdominal:     Palpations: Abdomen is soft.     Tenderness: There is no abdominal tenderness (mild ttp surrounding J tube. no pain around drain. No overlying abd abnormalities.).  Skin:    General: Skin is warm and dry.  Neurological:     Mental Status: She is alert and oriented to person, place, and time. Mental status is at baseline.     Imaging: No results found.  Labs:  CBC: Recent Labs    08/25/24 0604 08/28/24 0536 08/29/24 0622 09/01/24 0627  WBC 7.8 12.3* 7.4 6.9  HGB 8.4* 8.5* 9.7* 9.5*  HCT 26.4* 27.6* 32.0* 31.0*  PLT 422* 410* 377 433*    COAGS: Recent Labs    07/22/24 0350 07/23/24 0406 07/31/24 1137 08/26/24 1918  INR 1.0 1.2 1.2 1.2    BMP: Recent Labs    08/28/24 0536 08/29/24 0622 08/30/24 0512 09/01/24 0627  NA 134* 134* 136 138  K 4.4 5.7* 4.1 4.0  CL 105 108 108 110  CO2 15* 12* 17* 17*  GLUCOSE 269* 289* 283* 202*  BUN 29* 22 19 22   CALCIUM  8.3* 7.9* 7.9* 8.1*  CREATININE 1.32* 1.12* 1.26* 1.32*  GFRNONAA 43* 52* 45* 43*    LIVER FUNCTION TESTS: Recent Labs    08/12/24 0052 08/14/24 0347 08/16/24 0338 08/29/24 1033  BILITOT 0.6 0.7 0.5 0.5  AST 13* 18 13* 13*  ALT 19 21 17 19   ALKPHOS 83 117 98 147*  PROT 6.3* 6.9 6.7 6.7  ALBUMIN  1.6* 1.7* 1.5* <1.5*    Assessment and Plan:  S/p abd drain placement for intra-abdominal abscess on 08/26/24 - drain with 90cc output over last 24h - 25cc watery/tan output in bulb at bedside - no pain around drain. Drain flushes easily, unable to aspirate - Reviewed recent CT imaging and case with Dr. Vanice, no rec for repeat drain injection prior to d/c. Will see patient in outpt f/u for repeat imaging and drain injection at that time. IR outpt orders placed today. - went over drain care including flushing with patient at bedside today. Nurse to instruct daughter and husband how to flush drain prior to discharge tomorrow.  Drain Location: RUQ Size: Fr size: 12 Fr Date of placement:  08/26/24  Currently to: Drain collection device: suction bulb 24 hour output:  Output by Drain (mL) 09/01/24 0701 - 09/01/24 1900 09/01/24 1901 - 09/02/24 0700 09/02/24 0701 - 09/02/24 1900 09/02/24 1901 - 09/03/24 0700 09/03/24 0701 - 09/03/24 1356  Closed System Drain 2 Superior Abdomen 12 Fr.  30 40 50   Gastrostomy/Enterostomy Jejunostomy 18 Fr. LUQ         Current examination: Flushes easily, unable to aspirate Insertion site unremarkable. Suture and stat lock in place. Dressed appropriately.   Plan: Continue TID flushes with 5 cc NS. Record output Q shift. Dressing changes QD or PRN if soiled.  Call IR  APP or on call IR MD if difficulty flushing or sudden change in drain output.  Planning to proceed with discharge home tomorrow per hospitalist.     Electronically Signed: Kimble VEAR Clas, PA-C 09/03/2024, 1:47 PM   I spent a total of 25 Minutes at the the patient's bedside AND on the patient's hospital floor or unit, greater than 50% of which was counseling/coordinating care for abdominal drain follow up

## 2024-09-03 NOTE — Progress Notes (Signed)
 Occupational Therapy Discharge Summary  Patient Details  Name: MEMORI SAMMON MRN: [id number] Date of Birth: [birth year]  Date of Discharge from OT service:September 03, 2024    Patient has met 6 of 9 long term goals due to improved activity tolerance, improved balance, and ability to compensate for deficits.  Patient to discharge at overall Supervision level.  Patient's care partner is independent to provide the necessary physical assistance at discharge.  Family education with pt's sister.     Reasons goals not met: Mod ind goals set for sit to stands, standing balance and toilet transfers, but pt continues to need S with sit to stands and ambulation with RW due to low endurance.   Recommendation:  Patient will benefit from ongoing skilled OT services in home health setting to continue to advance functional skills in the area of BADL.  Equipment: Drop arm BSC  Reasons for discharge: treatment goals met  Patient/family agrees with progress made and goals achieved: Yes  OT Discharge Precautions/Restrictions  Precautions Precautions: Fall Precaution/Restrictions Comments: JP drainx2 (L), PEG tube Restrictions Weight Bearing Restrictions Per Provider Order: No   ADL ADL Equipment Provided: Reacher Eating: Independent Where Assessed-Eating: Bed level Grooming: Independent Where Assessed-Grooming: Sitting at sink Upper Body Bathing: Setup Where Assessed-Upper Body Bathing: Sitting at sink Lower Body Bathing: Supervision/safety Where Assessed-Lower Body Bathing: Edge of bed Upper Body Dressing: Setup Where Assessed-Upper Body Dressing: Edge of bed Lower Body Dressing: Supervision/safety Where Assessed-Lower Body Dressing: Edge of bed Toileting: Supervision/safety Where Assessed-Toileting: Other  Toilet Transfer: Close supervision Toilet Transfer Method: Proofreader:  bedside commode over toilet Vision Baseline Vision/History: 1 Wears  glasses Patient Visual Report: No change from baseline Vision Assessment?: No apparent visual deficits Perception  Perception: Within Functional Limits Praxis Praxis: WFL Cognition Cognition Overall Cognitive Status: Within Functional Limits for tasks assessed Brief Interview for Mental Status (BIMS) Repetition of Three Words (First Attempt): 3 Temporal Orientation: Year: Correct Temporal Orientation: Month: Accurate within 5 days Temporal Orientation: Day: Correct Recall: Sock: Yes, no cue required Recall: Blue: Yes, no cue required Recall: Bed: Yes, no cue required BIMS Summary Score: 15 Sensation Sensation Light Touch: Appears Intact Hot/Cold: Appears Intact Proprioception: Appears Intact Stereognosis: Appears Intact Coordination Gross Motor Movements are Fluid and Coordinated: Yes Fine Motor Movements are Fluid and Coordinated: Yes Motor  Motor Motor: Within Functional Limits Mobility    Supervision with RW Trunk/Postural Assessment  Postural Control Postural Control: Within Functional Limits  Balance Dynamic Sitting Balance Dynamic Sitting - Level of Assistance: 7: Independent Static Standing Balance Static Standing - Level of Assistance: 5: Stand by assistance Extremity/Trunk Assessment RUE Assessment RUE Assessment: Within Functional Limits LUE Assessment LUE Assessment: Within Functional Limits   Macy Polio 09/03/2024, 12:29 PM

## 2024-09-03 NOTE — Progress Notes (Addendum)
 Occupational Therapy Session Note  Patient Details  Name: Lauren Gray MRN: [id number] Date of Birth: September 26, 1951  Today's Date: 09/03/2024 OT Individual Time: 0920-1000 OT Individual Time Calculation (min): 40 min    Short Term Goals: Week 1:  OT Short Term Goal 1 (Week 1): The pt safely transfer to all surface with MinA using AE as needed. OT Short Term Goal 1 - Progress (Week 1): Met OT Short Term Goal 2 (Week 1): The pt will bathe/dress LB safely with MinA using AE as needed.. OT Short Term Goal 2 - Progress (Week 1): Progressing toward goal OT Short Term Goal 3 (Week 1): The pt will safely participate in > 30 minutes of Occupational Therapy  activity with minimal rest breaks following demonstration and initial cues. OT Short Term Goal 3 - Progress (Week 1): Progressing toward goal OT Short Term Goal 4 (Week 1): The pt will safely demonstrate a simple homemaking task at [facility] after demonstration and initial cues. OT Short Term Goal 4 - Progress (Week 1): Progressing toward goal Week 2:  OT Short Term Goal 1 (Week 2): Pt willl be able to stand and manage clothing over hips with min A pre and post toileting. OT Short Term Goal 2 (Week 2): Pt will be able to don pants over feet using reacher with min A. OT Short Term Goal 3 (Week 2): Pt will be able to walk in and out of the bathroom with CGA and no symptoms of shortness of breath to demonstrate improved activity tolerance.     Skilled Therapeutic Interventions/Progress Updates:    Pt received in recliner with spouse and sister in the room.   Pt stated she is continuing to have loose stools so opted to work in the room. Pt declined the need for bathing and dressing  but did agree to work on Acupuncturist of pants over hips with S.  Pt continues to need S with sit to stands and ambulation with RW due to low endurance.  She tends to be a mouth breather and has not been able to do relaxed belly breaths which can keep her HR lower with  ambulation tasks.  She did 3 sit to stands, ambulation in room from end to end of room 4x with RW with S.   Discussed discharge and pt is not worried about her ability to move at home or go up and down stairs but is worried about having loose stools.  Told them I would ask the medical team what interventions could be done.   Pt resting in recliner with all needs met.   Therapy Documentation Precautions:  Precautions Precautions: Fall, Other (comment) Precaution/Restrictions Comments: JP drainx2 (L), PEG tube Restrictions   Vital Signs: Therapy Vitals Pulse Rate: 83 BP: 130/62 Pain:  Pt has ongoing abdominal discomfort but does not limit her ability to participate ADL: ADL Equipment Provided: Reacher Eating: Independent Where Assessed-Eating: Bed level Grooming: Independent Where Assessed-Grooming: Sitting at sink Upper Body Bathing: Setup Where Assessed-Upper Body Bathing: Sitting at sink Lower Body Bathing: Supervision/safety Where Assessed-Lower Body Bathing: Edge of bed Upper Body Dressing: Setup Where Assessed-Upper Body Dressing: Edge of bed Lower Body Dressing: Supervision/safety Where Assessed-Lower Body Dressing: Edge of bed Toileting: Supervision/safety Where Assessed-Toileting: Other (Comment) Toilet Transfer: Close supervision Toilet Transfer Method: Proofreader: Extra wide bedside commode, Grab bars     Therapy/Group: Individual Therapy  Sarie Stall 09/03/2024, 12:19 PM

## 2024-09-03 NOTE — Progress Notes (Addendum)
 Patient ID: Lauren Gray, female   DOB: 1951-08-31, 73 y.o.   MRN: [id number]  Have faxed and scanned the patient enrollment application for [employer] so she can receive this at home. Also contacted Marta-support person for [employer]. Await return call and work toward discharge tomorrow. Have alerted home health of her impending discharge.   12:35 PM Spoke with Marta-CM for [employer] she has received the paperwork and will reach out to pt or daughter. Updated both husband and pt regarding team conference reaching goals and readiness for discharge for tomorrow. Check on wheelchair delivery  2:27 PM Pt and two sister's here and have many questions regarding education and was told by radiology to be here at 6:30 AM tomorrow to learn how to flush her drains at home. Pt wants to know the steps her care will require at home. Seems to have medical questions. Dom-PT was present and messaged Pam-PA to speak with pt and sister's and call daughter. Pt wants to make sure all of the MD's involved are speaking with one another and the plan is in place for discharge. According to Deborah-Clinical Coordinator the nursing education has been done with husband and daughter, except the flushing of the drains.

## 2024-09-03 NOTE — Progress Notes (Signed)
 Subjective/Chief Complaint:  Multiple episodes of diarrhea today.   Objective: Vital signs in last 24 hours: Temp:  [97.6 F (36.4 C)-98.4 F (36.9 C)] 97.6 F (36.4 C) (10/22 1449) Pulse Rate:  [83-93] 93 (10/22 1449) Resp:  [18-20] 20 (10/22 1449) BP: (130-150)/(59-66) 140/66 (10/22 1449) SpO2:  [100 %] 100 % (10/22 1449) Weight:  [887 kg] 112 kg (10/22 0500) Last BM Date : 09/03/24  Intake/Output from previous day: 10/21 0701 - 10/22 0700 In: 1035 [P.O.:720; I.V.:15; NG/GT:300] Out: 90 [Drains:90] Intake/Output this shift: Total I/O In: 825 [P.O.:720; I.V.:5; NG/GT:100] Out: -   PE:  Constitutional: alert and oriented.  Well groomed.  Eyes: Anicteric sclerae Lungs: normal respiratory effort GI: Soft, overall non-tender to palpation, IR drain bulb with colorless cloudy output. Much less skin drainage.   Psychiatric: appropriate judgment and insight, oriented to person, place, and time   Lab Results:  Recent Labs    09/01/24 0627 09/03/24 1532  WBC 6.9 8.7  HGB 9.5* 9.1*  HCT 31.0* 30.4*  PLT 433* 493*   BMET Recent Labs    09/01/24 0627 09/03/24 1532  NA 138 139  K 4.0 4.4  CL 110 112*  CO2 17* 16*  GLUCOSE 202* 122*  BUN 22 22  CREATININE 1.32* 1.20*  CALCIUM  8.1* 8.3*  Anti-infectives: Anti-infectives (From admission, onward)    Start     Dose/Rate Route Frequency Ordered Stop   09/04/24 0000  levofloxacin (LEVAQUIN) 750 MG tablet        750 mg Oral Daily 09/03/24 1755     09/02/24 1000  levofloxacin (LEVAQUIN) tablet 750 mg        750 mg Oral Daily 09/02/24 0845 09/12/24 0759   08/31/24 1600  meropenem (MERREM) 2 g in sodium chloride  0.9 % 100 mL IVPB  Status:  Discontinued        2 g 280 mL/hr over 30 Minutes Intravenous Every 8 hours 08/31/24 1442 09/02/24 0908   08/30/24 0800  amoxicillin-clavulanate (AUGMENTIN) 875-125 MG per tablet 1 tablet  Status:  Discontinued        1 tablet Oral Every 12 hours 08/28/24 1102 08/29/24 1258    08/28/24 1400  piperacillin -tazobactam (ZOSYN ) IVPB 3.375 g  Status:  Discontinued        3.375 g 12.5 mL/hr over 240 Minutes Intravenous Every 8 hours 08/28/24 1102 08/31/24 1442   08/28/24 1130  amoxicillin-clavulanate (AUGMENTIN) 875-125 MG per tablet 1 tablet  Status:  Discontinued        1 tablet Oral Every 12 hours 08/28/24 1033 08/28/24 1057   08/18/24 0800  fluconazole  (DIFLUCAN ) tablet 200 mg  Status:  Discontinued        200 mg Oral Daily 08/17/24 0819 08/25/24 0958   08/17/24 1000  fluconazole  (DIFLUCAN ) tablet 400 mg        400 mg Oral  Once 08/17/24 0819 08/17/24 1002       Assessment/Plan: s/p Whipple,  jejunostomy tube Dr. 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  Margins negative.  Complicated by pancreatic leak/abscess  18 h tube feed cycle for full calorie intake; currently decreased to 14 hours  Megace for appetite stimulant.   carb mod diet as tolerated.  Continue banatrol for stool bulking.   IR drain in place.  No surgical drains remaining.  Klebsiella and pseudomonas sensitive to levaquin.  Levaquin started yesterday.  Labs ok today.   Drain care.  CT repeated 10/17 w/ fluid collection near distal pancreas - not drainable per IR unfortunately.  Dispo - per PM&R. D/c pushed out to 10/23.    Surgical plan will be to d/c with levaquin for 10 days.  Follow up with me in 2 weeks.      LOS: 19 days    Jina LITTIE Nephew, MD, FACS, FSSO Surgical Oncology, General Surgery, Trauma and Critical [facility], [place] [phone] for weekday/non holidays Check [email or web address] for coverage night/weekend/holidays

## 2024-09-03 NOTE — Progress Notes (Signed)
 Physical Therapy Session Note  Patient Details  Name: Lauren Gray MRN: [id number] Date of Birth: 1951-10-22  Today's Date: 09/03/2024 PT Individual Time: [id number]; 1340 - 1440 PT Individual Time Calculation (min): 40 min; 60 min   Short Term Goals: Week 2:  PT Short Term Goal 1 (Week 2): STG = LTG d/t ELOS  SESSION 1 Skilled Therapeutic Interventions/Progress Updates: Patient sitting in recliner with family present on entrance to room. Patient alert and agreeable to PT session.   Patient reported no pain during session. PTA discussed pt's use of RW at all times at home and to place chair on platform following first set of steps for rest if needed prior to advancing to next set of steps. PTA adjusted pt's new RW to fit.   Therapeutic Activity: Transfers: Pt performed sit<>stand transfers throughout session with supervision to RW.   Pt transported from room<>day room gym. Pt ambulated 30' x 1 in RW with rest break required due to reports of increased dizziness (cued to find vertical lines to orient self to upright posture). Pt then ambulated 40' x 1 (overall supervision with ambulation in RW) with same reports and pt presenting with increase in shakiness in hand with reports of increased anxiety. Pt cued to perform environmental grounding technique to find a few things pt can see, touch, smell hear and taste with pt reporting it helped a little (reported feeling overwhelmed by various noises and busy environment).   Patient sitting in recliner at end of session with brakes locked, family present, and all needs within reach.  SESSION 2 Skilled Therapeutic Interventions/Progress Updates: Patient sitting in recliner with family present on entrance to room. Patient alert and agreeable to PT session.   Patient reported no pain during session, only concern about d/c home tomorrow as there seemed to have been some confusion regarding pt and family education on flushing JP tubes with radiology.  PTA discussed with proper channels to further ensure pt had greater understanding of how d/c paperwork and finalization goes day of with PA. Pt stated confusion and family ed had nothing to do with pt's therapy, but only miscommunication/understanding on pt's medical education. Pt performed sit<>stand transfers during session with RW and supervision and VC to increase anterior scoot and hand placement to push. Pt transported to main gym in [facility] and performed stair navigation (2 x 4, 6 steps) with rest break required. Pt ascended step to pattern with BHR on first round, then descended with B UE on one rail and lateral step down. Pt then cued to ascend with lateral step and one hand rail per home set up on first step of steps (2 rails on 2nd step after landing), then descend with B UE on railing with anterior step to. Pt performed all with supervision with further reiteration to place chair on platform after first set, then on top and bottom of steps for pt to rest as needed and for family to assist with RW navigation. Pt transported outside to [facility] in [facility] briefly per request to boost pt morale. Pt transported back to room and grateful for end of session to get fresh air and change of environment.   Patient sitting in WC at end of session with brakes locked, family present, and all needs within reach.     Therapy Documentation Precautions:  Precautions Precautions: Fall Precaution/Restrictions Comments: JP drainx2 (L), PEG tube Restrictions Weight Bearing Restrictions Per Provider Order: No  Therapy/Group: Individual Therapy  Leib Elahi PTA 09/03/2024, 12:31 PM

## 2024-09-03 NOTE — Progress Notes (Signed)
 Brief Nutrition Support Note  Pt started RELiZORB for nocturnal feeds last night. Checked in with pt this morning and she reported less abdominal discomfort this morning and states she is feeling better. Reports 2 loose stools this morning, but states they have not been as much volume and have been less frequency. Will continue to check in and monitor how pt feels rest of the day into tomorrow morning before discharge.     Josette Glance, MS, RDN, LDN Clinical Dietitian I Please reach out via secure chat

## 2024-09-03 NOTE — Progress Notes (Signed)
 Physical Therapy Discharge Summary  Patient Details  Name: Lauren Gray MRN: [id number] Date of Birth: 02/13/1951  Date of Discharge from PT service:September 03, 2024  Patient has met 4 of 6 long term goals due to {due un:6958322}.  Patient to discharge at [facility] level {LOA:3049010}.   Patient's care partner {care partner:3041650} to provide the necessary {assistance:3041652} assistance at discharge.  Reasons goals not met: Pt had decline in endurance due to needing to have new JP lines places, which caused pt to miss therapy sessions. Pt able to ambulate 4 steps (ascend and descend) but requires seated rest due to fatigue, and pt able to ambulate 75' max during stay in RW.   Recommendation:  Patient will benefit from ongoing skilled PT services in {setting:3041680} to continue to advance safe functional mobility, address ongoing impairments in ***, and minimize fall risk.  Equipment: {equipment:3041657}  Reasons for discharge: {Reason for discharge:3049018}  Patient/family agrees with progress made and goals achieved: {Pt/Family agree with progress/goals:3049020}  PT Discharge Precautions/Restrictions Precautions Precautions: Fall Precaution/Restrictions Comments: JP drain and PEG tube Restrictions Weight Bearing Restrictions Per Provider Order: No Pain Pain Assessment Pain Scale: 0-10 Pain Score: 0-No pain Pain Interference Pain Interference Pain Effect on Sleep: 2. Occasionally Pain Interference with Therapy Activities: 1. Rarely or not at all Pain Interference with Day-to-Day Activities: 1. Rarely or not at all Vision/Perception  Vision - History Ability to See in Adequate Light: 0 Adequate Perception Perception: Within Functional Limits Praxis Praxis: WFL  Cognition Overall Cognitive Status: Within Functional Limits for tasks assessed Arousal/Alertness: Awake/alert Orientation Level: Oriented X4 Memory: Appears intact Awareness: Appears intact Problem  Solving: Appears intact Safety/Judgment: Appears intact Sensation Sensation Light Touch: Appears Intact Hot/Cold: Appears Intact Proprioception: Appears Intact Stereognosis: Appears Intact Coordination Gross Motor Movements are Fluid and Coordinated: Yes Fine Motor Movements are Fluid and Coordinated: Yes Motor  Motor Motor: Within Functional Limits  Mobility Bed Mobility Bed Mobility: Supine to Sit;Sit to Supine Supine to Sit: Supervision/Verbal cueing Sit to Supine: Supervision/Verbal cueing Transfers Transfers: Sit to Stand;Stand to Sit;Stand Pivot Transfers Sit to Stand: Supervision/Verbal cueing Stand to Sit: Supervision/Verbal cueing Stand Pivot Transfers: Supervision/Verbal cueing Transfer (Assistive device): Rolling walker Locomotion  Gait Ambulation: Yes Gait Assistance: Supervision/Verbal cueing Gait Distance (Feet): 40 Feet Assistive device: Rolling walker Gait Gait: Yes Gait Pattern: Impaired Gait Pattern: Trunk flexed Gait velocity: Decreased but has improved Stairs / Additional Locomotion Stairs: Yes Stairs Assistance: Supervision/Verbal cueing Stair Management Technique: Two rails Number of Stairs: 4 Height of Stairs: 6 Wheelchair Mobility Wheelchair Mobility: Yes Wheelchair Assistance: Dependent - Patient 0% Wheelchair Parts Management: Needs assistance  Trunk/Postural Assessment  Cervical Assessment Cervical Assessment: Within Functional Limits Thoracic Assessment Thoracic Assessment: Within Functional Limits Lumbar Assessment Lumbar Assessment: Within Functional Limits Postural Control Postural Control: Within Functional Limits  Balance Balance Balance Assessed: Yes Dynamic Sitting Balance Dynamic Sitting - Balance Support: During functional activity;Feet supported Dynamic Sitting - Level of Assistance: 7: Independent Static Standing Balance Static Standing - Balance Support: Bilateral upper extremity supported Static Standing - Level  of Assistance: 5: Stand by assistance Extremity Assessment  RUE Assessment RUE Assessment: Within Functional Limits LUE Assessment LUE Assessment: Within Functional Limits RLE Assessment RLE Assessment: Within Functional Limits LLE Assessment LLE Assessment: Within Functional Limits   Damascus Feldpausch PTA   09/03/2024, 3:05 PM

## 2024-09-03 NOTE — Progress Notes (Signed)
 Attempted to follow up on any questions prior to discharge tomorrow. Patient extremely upset that no education has been provided to her and that I had not welcomed her to rehab.  She was extremely upset that no education has been provided to her during her stay--listed not educated on drain care, tube feeds, insulin  needs, diarrhea not resolved (despite conversation this week that diarrhea secondary to pancreatic insufficiency and tube feeds) and feels that she was not prepared for discharge. Confirmed  via notes and by charge that RD has been following and radiology has been following with education. Nurse to educate on insulin  administration with patient and family in am as well as drain care. Glucometer ordered for use at home. Advised patient that PCP to follow up on medical needs, IR for drain care/management and Dr. Aron for post op/pancreatic issues.

## 2024-09-04 ENCOUNTER — Other Ambulatory Visit (HOSPITAL_COMMUNITY): Payer: Self-pay

## 2024-09-04 LAB — CBC
HCT: 28 % — ABNORMAL LOW (ref 36.0–46.0)
Hemoglobin: 8.5 g/dL — ABNORMAL LOW (ref 12.0–15.0)
MCH: 27.1 pg (ref 26.0–34.0)
MCHC: 30.4 g/dL (ref 30.0–36.0)
MCV: 89.2 fL (ref 80.0–100.0)
Platelets: 416 K/uL — ABNORMAL HIGH (ref 150–400)
RBC: 3.14 MIL/uL — ABNORMAL LOW (ref 3.87–5.11)
RDW: 16.3 % — ABNORMAL HIGH (ref 11.5–15.5)
WBC: 8.6 K/uL (ref 4.0–10.5)
nRBC: 0 % (ref 0.0–0.2)

## 2024-09-04 LAB — BASIC METABOLIC PANEL WITH GFR
Anion gap: 10 (ref 5–15)
BUN: 19 mg/dL (ref 8–23)
CO2: 17 mmol/L — ABNORMAL LOW (ref 22–32)
Calcium: 8.2 mg/dL — ABNORMAL LOW (ref 8.9–10.3)
Chloride: 114 mmol/L — ABNORMAL HIGH (ref 98–111)
Creatinine, Ser: 1.18 mg/dL — ABNORMAL HIGH (ref 0.44–1.00)
GFR, Estimated: 49 mL/min — ABNORMAL LOW
Glucose, Bld: 239 mg/dL — ABNORMAL HIGH (ref 70–99)
Potassium: 4.1 mmol/L (ref 3.5–5.1)
Sodium: 141 mmol/L (ref 135–145)

## 2024-09-04 MED ORDER — INSULIN GLARGINE-YFGN 100 UNIT/ML ~~LOC~~ SOLN
15.0000 [IU] | Freq: Every day | SUBCUTANEOUS | Status: DC
Start: 2024-09-04 — End: 2024-09-04
  Filled 2024-09-04: qty 0.15

## 2024-09-04 MED ORDER — INSULIN GLARGINE 100 UNIT/ML SOLOSTAR PEN
15.0000 [IU] | PEN_INJECTOR | Freq: Every day | SUBCUTANEOUS | 0 refills | Status: DC
Start: 2024-09-04 — End: 2024-09-12
  Filled 2024-09-04: qty 15, 100d supply, fill #0

## 2024-09-04 NOTE — Progress Notes (Signed)
 Patient ID: Lauren Gray, female   DOB: [birth year], 73 y.o.   MRN: [id number] Surgery:  S/p whipple  D/c plans Drain care measure and record- in d/c instructions  Tube feeds 14 h/day with plans to wean.  Levaquin x 10 days.  Dry dressing prn to midline.   Ok to shower.  No lifting Creon  TID and imodium  prn for diarrhea

## 2024-09-04 NOTE — Progress Notes (Signed)
 Inpatient Rehabilitation Care Coordinator Discharge Note   Patient Details  Name: Lauren Gray MRN: [id number] Date of Birth: 09/03/1951   Discharge location: HOME WITH HUSBAND AND DAUGHTER AWARE OF HER CARE NEEDS  Length of Stay: 20 DAYS  Discharge activity level: SUPERVISION LEVEL  Home/community participation: ACTIVE  Patient response un:Yzjouy Literacy - How often do you need to have someone help you when you read instructions, pamphlets, or other written material from your doctor or pharmacy?: Never  Patient response un:Dnrpjo Isolation - How often do you feel lonely or isolated from those around you?: Never  Services provided included: MD, RD, PT, OT, RN, CM, TR, Pharmacy, Neuropsych, SW  Financial Services:  Field seismologist Utilized: Private Insurance [employer]  Choices offered to/list presented to: PT AND HUSBAND  Follow-up services arranged:  Home Health, DME, Patient/Family has no preference for HH/DME agencies Home Health Agency: [facility]  PT  OT  RN    DME : [facility]-WHEELCHAIR, DROP-ARM BEDSIDE COMMODE   [facility]-TUBE FEEDINGS AND SUPPLIES   PREFERRED AMBULANCE HOME DUE TO BOWEL ISSUES Patient response to transportation need: Is the patient able to respond to transportation needs?: Yes In the past 12 months, has lack of transportation kept you from medical appointments or from getting medications?: No In the past 12 months, has lack of transportation kept you from meetings, work, or from getting things needed for daily living?: No   Patient/Family verbalized understanding of follow-up arrangements:  Yes  Individual responsible for coordination of the follow-up plan: SYLVESTER-HUSBAND [phone]  Confirmed correct DME delivered: Raymonde Asberry MATSU 09/04/2024    Comments (or additional information):HUSBAND AND DAUGHTER WERE EDUCATED ON PT'S CARE NEEDS-TUBE FEEDINGS, JP DRAIN CARE AND DRESSING CHANGES.   Summary of Stay    Date/Time  Discharge Planning CSW  09/03/24 [phone] Working toward discharege for tomorrow. Relizorb pateint enrollment application completed-DME in place and Home Health in place. Family training with husband and daughter on-going. Pt hopes to go home tomorrow it has been a long journey since admission to the hospital RGD  08/27/24 0846 Working on discharge needs-unsure if tube feedings will be covered due to pt able to eat just not enough. Will order DME once have recommendation. Team needs to be training husband and daughter on drain and peg care. Husband here daily and helps some with her care. Home health in place RGD  08/20/24 0850 Home with husband who is here daily and observing. Husband is disabled himself but can assist some. Pt is wanting to leave soon but needs to be at a level she can be managed at home. Work on discharge needs. RGD       Nicholous Girgenti G

## 2024-09-04 NOTE — Progress Notes (Signed)
 Husband educated about use of an insulin  pen via demonstration and discussion. He expressed that was comfortable with the procedure and confident that he would have no trouble managing and administering the insulin .

## 2024-09-04 NOTE — Progress Notes (Addendum)
 Patient ID: Lauren Gray, female   DOB: 30-Nov-1950, 73 y.o.   MRN: [id number]  Have sent wheelchair to be delivered home due to pt and husband are not wanting ambulance transport home.   10:16 AM Set up ambulance for 12:00 husband reports bedside commode to be delivered to home this afternoon-have added the wheelchair to this order also for delivery home. Daughter reports the surgeon did the teaching for drains-flushing and emptying this am when seeing pt.

## 2024-09-04 NOTE — Plan of Care (Signed)
 Patient with diarrhea on TF. Imodium  prn and ReliZorb cartridges in use with TF. Dietary modification recommendations

## 2024-09-04 NOTE — Progress Notes (Signed)
 Inpatient Rehabilitation Discharge Medication Review by a Pharmacist  A complete drug regimen review was completed for this patient to identify any potential clinically significant medication issues.  High Risk Drug Classes Is patient taking? Indication by Medication  Antipsychotic Yes Compazine - N/V  Anticoagulant No   Antibiotic Yes Levofloxacin - IAI  Opioid No   Antiplatelet No   Hypoglycemics/insulin  Yes Novolog , Lantus - Hyperglycemia, DM  Vasoactive Medication Yes Amlodipine , carvedilol, clonidine  -HTN  Chemotherapy No   Other Yes Acetaminophen  - pain  Banatrol TF- fiber supplement Creon - pancreatic enzymes Flonase  NS- allergies Gabapentin  - neuropathy Mallie Pinion standard enteral TF- feeding supplement Loperamide  - loose stools or diarrhea Megace - appetitie stimulant  Methocarbamol - muscle spasms  Pantoprazole - reflux  Trazodone  - insomnia     Type of Medication Issue Identified Description of Issue Recommendation(s)  Drug Interaction(s) (clinically significant)     Duplicate Therapy     Allergy     No Medication Administration End Date     Incorrect Dose     Additional Drug Therapy Needed     Significant med changes from prior encounter (inform family/care partners about these prior to discharge).  Communicate relevant medication changes to patient/family members at discharge from CIR.   Restart or discontinue PTA meds not resumed in CIR at discharge if clinically indicated.   Other       Clinically significant medication issues were identified that warrant physician communication and completion of prescribed/recommended actions by midnight of the next day:  No  Name of provider notified for urgent issues identified:   Provider Method of Notification:     Pharmacist comments:   Time spent performing this drug regimen review (minutes):  17  Levorn Gaskins, RPh Clinical Pharmacist 09/04/2024  8:21 AM **Pharmacist phone directory can now be found on  [email or web address] ([email or web address]).  Listed under [facility].

## 2024-09-04 NOTE — Progress Notes (Signed)
 PROGRESS NOTE    Subjective/Complaints: Per family IR PA did not show up for education this am at 730 to go over IR drain flushes  Appreciate Gen Surg note as well as teaching provided regarding IR drain flush, per charge RN, this has been completed 10/22 pm  family (daughter as primary caregiver) concerned that insulin  administration education was not completed prior - per charge RN this was completed yesterday pm Daughter states that TF education was provided and charge RN confirmed this occurred yesterday  ROS: Patient denies CP, SOB,  N/V/D    Objective:   No results found.      Recent Labs    09/03/24 1532 09/04/24 0434  WBC 8.7 8.6  HGB 9.1* 8.5*  HCT 30.4* 28.0*  PLT 493* 416*    Recent Labs    09/03/24 1532 09/04/24 0434  NA 139 141  K 4.4 4.1  CL 112* 114*  CO2 16* 17*  GLUCOSE 122* 239*  BUN 22 19  CREATININE 1.20* 1.18*  CALCIUM  8.3* 8.2*     Intake/Output Summary (Last 24 hours) at 09/04/2024 0940 Last data filed at 09/04/2024 9362 Gross per 24 hour  Intake 885 ml  Output 50 ml  Net 835 ml        Physical Exam: Vital Signs Blood pressure (!) 137/56, pulse 95, temperature 98.4 F (36.9 C), temperature source Oral, resp. rate 17, height 5' 8 (1.727 m), weight 112 kg, SpO2 100%.    General: No acute distress Mood and affect are appropriate Heart: Regular rate and rhythm no rubs murmurs or extra sounds Lungs: Clear to auscultation, breathing unlabored, no rales or wheezes Abdomen: Positive bowel sounds, soft nontender to palpation, nondistended,Jtube and drain sites without leakage or tenderness Extremities: No clubbing, cyanosis, or edema  Neuro: AAOx4. No apparent cognitive deficits    Assessment/Plan: 1. 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 Stable for D/C today F/u PCP in 1-2 weeks F/u Gen Surgery 1-2 wks No  PM&R f/u  See D/C  summary See D/C instructions   Care Tool:  Bathing    Body parts bathed by patient: Right arm, Left arm, Chest, Abdomen, Front perineal area, Buttocks, Right upper leg, Left upper leg, Face, Right lower leg, Left lower leg     Body parts n/a: Right lower leg, Left lower leg   Bathing assist Assist Level: Supervision/Verbal cueing     Upper Body Dressing/Undressing Upper body dressing   What is the patient wearing?: Pull over shirt    Upper body assist Assist Level: Set up assist    Lower Body Dressing/Undressing Lower body dressing      What is the patient wearing?: Pants     Lower body assist Assist for lower body dressing: Supervision/Verbal cueing     Toileting Toileting    Toileting assist Assist for toileting: Supervision/Verbal cueing     Transfers Chair/bed transfer  Transfers assist  Chair/bed transfer activity did not occur: Safety/medical concerns (2/2 high fatigue)  Chair/bed transfer assist level: Supervision/Verbal cueing     Locomotion Ambulation   Ambulation assist   Ambulation activity did not occur: Safety/medical concerns (2/2 high fatigue)  Assist  level: Supervision/Verbal cueing Assistive device: Walker-rolling Max distance: 75   Walk 10 feet activity   Assist  Walk 10 feet activity did not occur: Safety/medical concerns (2/2 high fatigue)  Assist level: Supervision/Verbal cueing Assistive device: Walker-rolling   Walk 50 feet activity   Assist Walk 50 feet with 2 turns activity did not occur: Safety/medical concerns (2/2 high fatigue)  Assist level: Supervision/Verbal cueing Assistive device: Walker-rolling    Walk 150 feet activity   Assist Walk 150 feet activity did not occur: Safety/medical concerns (decreased endurance)         Walk 10 feet on uneven surface  activity   Assist Walk 10 feet on uneven surfaces activity did not occur: Safety/medical concerns (2/2 high fatigue)   Assist level:  Supervision/Verbal cueing Assistive device: Walker-rolling   Wheelchair     Assist Is the patient using a wheelchair?: Yes Type of Wheelchair: Manual Wheelchair activity did not occur: Safety/medical concerns (2/2 high fatigue)  Wheelchair assist level: Dependent - Patient 0% Max wheelchair distance: 150    Wheelchair 50 feet with 2 turns activity    Assist    Wheelchair 50 feet with 2 turns activity did not occur: Safety/medical concerns (2/2 high fatigue)   Assist Level: Dependent - Patient 0%   Wheelchair 150 feet activity     Assist  Wheelchair 150 feet activity did not occur: Safety/medical concerns (2/2 high fatigue)   Assist Level: Dependent - Patient 0%   Blood pressure (!) 137/56, pulse 95, temperature 98.4 F (36.9 C), temperature source Oral, resp. rate 17, height 5' 8 (1.727 m), weight 112 kg, SpO2 100%.   Medical Problem List and Plan: 1. 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  Antithrombotics: -DVT/anticoagulation:  Pharmaceutical: Lovenox              -antiplatelet therapy: N/A 3. Pain Management: Tylenol  prn.  .    Pain improving --uses tramadol  on occasion 4. Mood/Behavior/Sleep: LCSW to follow for evaluation and support.              -antipsychotic agents: N/A 5. Neuropsych/cognition: This patient is capable of making decisions on her own behalf. 6. Skin/Wound Care: Monitor wound for healing.  Still with 2 lines as well as J-tube some tenderness around the J-tube but no drainage  -can't drain fluid collection at distal pancreas per IR  IR to check possible dye injection  7. Fluids/Electrolytes/Nutrition: Monitor I/O. Continue tube feeds    Latest Ref Rng & Units 09/04/2024    4:34 AM  09/03/2024    3:32 PM 09/01/2024    6:27 AM  BMP  Glucose 70 - 99 mg/dL 760  877  797   BUN 8 - 23 mg/dL 19  22  22    Creatinine 0.44 - 1.00 mg/dL 8.81  8.79  8.67   Sodium 135 - 145 mmol/L 141  139  138   Potassium 3.5 - 5.1 mmol/L 4.1  4.4  4.0   Chloride 98 - 111 mmol/L 114  112  110   CO2 22 - 32 mmol/L 17  16  17    Calcium  8.9 - 10.3 mg/dL 8.2  8.3  8.1      Requires tube  feeds via J tube due to gastric outlet obstruction , anticipate need for months to improve nutrition for wound healing  8. Duodenal adenocarcinoma w/gastric outlet obst:  continues on tube feed for nutritional support.   9. Abdominal distension:  improve motility lass stomach gas, some mild ileus noted per Gen surg  only ~50mg  tramadol  per day     Chest pain resolved off of p.o. feeds 10. Fluid overload/Pleural effusions: In part due to third spacing. S/p thoracocentesis.  Right side improved , left side    11. HTN: Monitor BP TID--on amlodipine , catapres  and coreg          Mild elevation monitor on current meds avoid diuretics due to hydration status Vitals:   09/03/24 2008 09/04/24 0510  BP: (!) 140/54 (!) 137/56  Pulse: 97 95  Resp: 17 17  Temp: 98.3 F (36.8 C) 98.4 F (36.9 C)  SpO2: 100% 100%   12.  Prediabetes: Hgb A1c- 6.1. Elevated BS due to tube feeds             --continue Lantus  with meal coverage and SSI             --monitor BS ac/hs and use SSI for elevated BS   CBG (last 3)  Recent Labs    09/03/24 1203 09/03/24 1714 09/03/24 2103  GLUCAP 164* 92 117*   Lantus  to 35 units nightly, CBGs down  levaquin also expected to cause a further decline (10d course) , so will reduce Lantus  dose to 15 U will need PCP f/u to manage considering expected gradual conversion from TF to po feeds as recommended by Gen Surgery     13. Acute on chronic kidney disease: SCr 1.3 at admission and now WNL 14.  Obesity  Class 2: BMI 38.47 15.  ABLA: Recheck CBC in am. Hgb 8-9 range.   Stable 10/20     Latest Ref Rng & Units 09/04/2024    4:34 AM 09/03/2024    3:32 PM 09/01/2024    6:27 AM  CBC  WBC 4.0 - 10.5 K/uL 8.6  8.7  6.9   Hemoglobin 12.0 - 15.0 g/dL 8.5  9.1  9.5   Hematocrit 36.0 - 46.0 % 28.0  30.4  31.0   Platelets 150 - 400 K/uL 416  493  433     16. Severe protein malnutrition- J tube placed 9/9 Albumin  of 1.5- is declining-    17. Thrush  Resolved after nystatin  and diflucan    18.  Abd abscess with Leukocytosis resolved after initiation of IV Zosyn  10/20- as per cultures and pharmacy rec switched to meropenem,  Now switched to levaquin- monitor response, hope to d/c home on this med rather than IV abx hope to know by tomorrow   Normal WBCs 10/23 LOS: 20 days A FACE TO FACE EVALUATION WAS PERFORMED  Prentice FORBES Compton 09/04/2024, 9:40 AM

## 2024-09-05 ENCOUNTER — Other Ambulatory Visit (HOSPITAL_COMMUNITY): Payer: Self-pay

## 2024-09-05 ENCOUNTER — Other Ambulatory Visit: Payer: Self-pay | Admitting: Nurse Practitioner

## 2024-09-05 NOTE — Telephone Encounter (Unsigned)
 Copied from CRM [id number]. Topic: Clinical - Medication Refill >> Sep 05, 2024 10:02 AM Delon DASEN wrote: Medication: fiber supplement, BANATROL TF, liquid  Has the patient contacted their pharmacy? No (Agent: If no, request that the patient contact the pharmacy for the refill. If patient does not wish to contact the pharmacy document the reason why and proceed with request.) (Agent: If yes, when and what did the pharmacy advise?)  This is the patient's preferred pharmacy:    [facility] [facility] [address] [place] [address] Phone: [phone] Fax: [phone]  Is this the correct pharmacy for this prescription? Yes If no, delete pharmacy and type the correct one.   Has the prescription been filled recently? Yes  Is the patient out of the medication? Yes  Has the patient been seen for an appointment in the last year OR does the patient have an upcoming appointment? Yes  Can we respond through MyChart? No  Agent: Please be advised that Rx refills may take up to 3 business days. We ask that you follow-up with your pharmacy.

## 2024-09-08 ENCOUNTER — Other Ambulatory Visit: Payer: Self-pay | Admitting: General Surgery

## 2024-09-08 ENCOUNTER — Other Ambulatory Visit: Payer: Self-pay | Admitting: Nurse Practitioner

## 2024-09-08 ENCOUNTER — Other Ambulatory Visit: Payer: Self-pay

## 2024-09-08 ENCOUNTER — Other Ambulatory Visit (HOSPITAL_COMMUNITY): Payer: Self-pay

## 2024-09-08 DIAGNOSIS — K651 Peritoneal abscess: Secondary | ICD-10-CM

## 2024-09-08 MED ORDER — BANATROL TF EN LIQD: 60.0000 mL | Freq: Two times a day (BID) | ENTERAL | 2 refills | Status: DC

## 2024-09-08 MED ORDER — BANATROL TF EN LIQD
60.0000 mL | Freq: Two times a day (BID) | ENTERAL | 2 refills | Status: DC
  Filled 2024-09-08 – 2024-09-11 (×2): qty 3600, 30d supply, fill #0

## 2024-09-08 NOTE — Telephone Encounter (Signed)
 Copied from CRM [id number]. Topic: Clinical - Medication Question >> Sep 05, 2024 10:05 AM Delon DASEN wrote: Reason for CRM: daughter Cassius is asking if she can get a pill form of gabapentin  (NEURONTIN ) 250 MG/5ML solution- does not know how to administer the liquid- please call her [phone]

## 2024-09-08 NOTE — Telephone Encounter (Signed)
 Please advise [facility]

## 2024-09-09 LAB — GLUCOSE, CAPILLARY
Glucose-Capillary: 235 mg/dL — ABNORMAL HIGH (ref 70–99)
Glucose-Capillary: 124 mg/dL — ABNORMAL HIGH (ref 70–99)

## 2024-09-09 MED ORDER — GABAPENTIN 250 MG/5ML PO SOLN: 100.0000 mg | Freq: Three times a day (TID) | ORAL | 0 refills | Status: DC

## 2024-09-09 NOTE — Progress Notes (Signed)
 PATIENT NAVIGATOR PROGRESS NOTE  Name: Lauren Gray Date: 09/09/2024 MRN: [id number]  DOB: 01/15/1951   Reason for visit:  Follow-up Introductory Phone call  Comments:   Patient was recently discharged after a lengthy hospital stay.  Called and reintroduced self to the patient.  Informed patient we had received a referral for outpatient oncology follow-up and inquired on if patient would like to be seen at our [place] location or at the [facility] location.  Patient stated she was not aware of why she needed to be seen by our office and would like to discuss any further recommendations with Dr. Aron at her next scheduled appointment on 11/3.  Message sent to Dr. Aron regarding patient's request.     Time spent counseling/coordinating care: 30-45 minutes

## 2024-09-09 NOTE — Telephone Encounter (Signed)
 Was filled six days ago by hospital. Please advise [facility]

## 2024-09-10 NOTE — Discharge Summary (Cosign Needed Addendum)
 Physician Discharge Summary  Patient ID: Lauren Gray MRN: [id number] DOB/AGE: 73/11/1950 73 y.o.  Admit date: 08/15/2024 Discharge date: 09/04/2024  Discharge Diagnoses:  Principal Problem:   Debility Active Problems:   Prediabetes   AKI (acute kidney injury)   Adjustment disorder with depressed mood   Malnutrition of moderate degree   Pancreatic cancer (HCC)   Postoperative intra-abdominal abscess (HCC)--cultures positive for    Klebsiella and Pseudomonas  Acute blood loss anemia  Diarrhea    Discharged Condition: stable  Significant Diagnostic Studies: CT ABDOMEN PELVIS W CONTRAST Result Date: 08/29/2024 EXAM: CT ABDOMEN AND PELVIS WITH CONTRAST 08/29/2024 10:08:00 AM TECHNIQUE: CT of the abdomen and pelvis was performed with the administration of 75 mL of iohexol  (OMNIPAQUE ) 350 MG/ML injection. Multiplanar reformatted images are provided for review. Automated exposure control, iterative reconstruction, and/or weight-based adjustment of the mA/kV was utilized to reduce the radiation dose to as low as reasonably achievable. COMPARISON: CT of the abdomen and pelvis dated 08/26/2024. CLINICAL HISTORY: Abdominal pain, post-op; abscess, undrained abscess. FINDINGS: LOWER CHEST: Dependent atelectasis within the lower lobes bilaterally and a mild left-sided pleural effusion, similar to the prior exam. LIVER: The liver is unremarkable. GALLBLADDER AND BILE DUCTS: Status post cholecystectomy. No biliary ductal dilatation. SPLEEN: No acute abnormality. PANCREAS: Status post Whipple procedure. A right-sided straight catheter has been removed and replaced with a pigtail drainage catheter. The pigtail is situated within a complex fluid and gas collection near the neck of the pancreas. The collection has decreased in size since the previous study. There continues to be a fluid collection along the mid to distal body of the pancreas measuring approximately 4.4 x 2.8 x 7.9 cm. The choledochal jejunal  and gastrojejunal anastomosis are unremarkable. ADRENAL GLANDS: No acute abnormality. KIDNEYS, URETERS AND BLADDER: No stones in the kidneys or ureters. No hydronephrosis. No perinephric or periureteral stranding. Urinary bladder is unremarkable. GI AND BOWEL: Stomach demonstrates no acute abnormality. A jejunostomy catheter remains in place. There is no bowel obstruction. PERITONEUM AND RETROPERITONEUM: There is small volume of free fluid within the pelvis. No free air. VASCULATURE: Abdominal aorta is normal in caliber and demonstrates moderate calcific atheromatous disease. LYMPH NODES: No lymphadenopathy. REPRODUCTIVE ORGANS: Status post hysterectomy and bilateral salpingo-oophorectomy. BONES AND SOFT TISSUES: No acute osseous abnormality. There is a small periumbilical fat-containing hernia. IMPRESSION: 1. Complex fluid and gas collection near the neck of the pancreas with pigtail drainage catheter in place, decreased in size since the previous study. 2. Fluid collection along the mid to distal body of the pancreas measuring approximately 4.4 x 2.8 x 7.9 cm. 3. Status post Whipple procedure with unremarkable choledochol jejunal and gastrojejunal anastomosis. Jejunostomy catheter remains in place. 4. Small periumbilical fat-containing hernia. 5. Small volume of free fluid within the pelvis. 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 EXAM: CT-GUIDED RIGHT UPPER QUADRANT ABSCESS DRAINAGE CATHETER PLACEMENT COMPARISON:  CT AP, 08/25/2024. MEDICATIONS: The patient is currently admitted to the hospital and receiving intravenous antibiotics. The antibiotics were administered within an appropriate time frame prior to the initiation of the  procedure. ANESTHESIA/SEDATION: Moderate (conscious) sedation was employed during this procedure. A total of Versed  1 mg and Fentanyl  100 mcg was administered intravenously. Moderate Sedation Time: 24 minutes. The patient's level of consciousness and vital signs were  monitored continuously by radiology nursing throughout the procedure under my direct supervision. CONTRAST:  None FLUOROSCOPY TIME:  CT dose; 1422 mGycm COMPLICATIONS: None immediate. PROCEDURE: RADIATION DOSE REDUCTION: This exam was performed according to the departmental dose-optimization program which includes automated exposure control, adjustment of the mA and/or kV according to patient size and/or use of iterative reconstruction technique. Informed written consent was obtained from the patient and/or patient's representative after a discussion of the risks, benefits and alternatives to treatment. The patient was placed supine on the CT gantry and a pre procedural CT was performed re-demonstrating the known abscess/fluid collection within the RIGHT upper quadrant. The procedure was planned. A timeout was performed prior to the initiation of the procedure. The RIGHT upper quadrant was prepped and draped in the usual sterile fashion. The overlying soft tissues were anesthetized with 1% lidocaine  with epinephrine. Appropriate trajectory was planned with the use of a 22 gauge spinal needle. An 18 gauge trocar needle was advanced into the abscess/fluid collection and a short Amplatz super stiff wire was coiled within the collection. Appropriate positioning was confirmed with a limited CT scan. The tract was serially dilated allowing placement of a 12 Fr drainage catheter. Appropriate positioning was confirmed with a limited postprocedural CT scan. 5 mL of purulent serosanguineous fluid was aspirated. The tube was connected to bulb suction and sutured in place. A dressing was placed. The patient tolerated the procedure well without immediate post  procedural complication. IMPRESSION: Successful CT-guided placement of a 12 Fr drainage catheter into the location with aspiration of 5 mL of purulent serosanguineous fluid. Samples were sent to the laboratory as requested by the ordering clinical team. RECOMMENDATIONS: The patient will return to Vascular Interventional Radiology (VIR) for routine drainage catheter evaluation and exchange in 7-10 days. Thom Hall, MD [facility] Radiology Electronically Signed   By: Thom Hall M.D.   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 Abdominal pain. * Tracking Code: BO * EXAM: CT ABDOMEN AND PELVIS WITH CONTRAST TECHNIQUE: Multidetector CT imaging of the abdomen and pelvis was performed using the standard protocol following bolus administration of intravenous contrast. RADIATION DOSE REDUCTION: This exam was performed according to the departmental dose-optimization program which includes automated exposure control, adjustment of the mA and/or kV according to patient size and/or use of iterative reconstruction technique. CONTRAST:  OMNIPAQUE  IOHEXOL  350 MG/ML SOLN COMPARISON:  07/30/2024 FINDINGS: Lower chest: Previous right pleural effusion has resolved. Small to moderate left pleural effusion persists with associated passive atelectasis. Mild cardiomegaly. Hepatobiliary: Fluid density lesion of the lateral segment left hepatic lobe on image 24 series 3 measuring 0.1 by 0.9 cm, favoring benign cyst. Pancreas: Highly indistinct porta hepatis and pancreaticoduodenal region in the vicinity of the Whipple procedure. Pancreaticojejunostomy and presumed choledocho jejunostomy poorly seen but no obvious dorsal pancreatic duct  dilatation or intrahepatic biliary dilatation noted. Moderate peripancreatic stranding may reflect ongoing pancreatitis. Curving 12.1 by 4.1 by 2.4 cm (volume = 62 cm^3) abscess extends above the Whipple site nearly to the hiatus, and tracks around the anterior margin of the pancreatic body to extend down anterior to the lower portion of the descending duodenum. This contains gas and fluid. A separate abscess tracks from the pancreatic tail down along the left anterior perirenal fascia and measures  11.5 by 3.4 by 3.0 cm (volume = 61 cm^3), with internal fluid and a small amount of gas along with thick enhancing margins. There is potentially a third small abscesses along the anterior gastrohepatic ligament region measuring 2.0 by 1.6 by 2.5 cm (volume = 4.2 cm^3) on image 26 series 3 A left-sided jejunostomy catheter is noted. A right upper quadrant drain extends just above the transverse colon but is not traversing the described abscesses. Spleen: Small amount of perisplenic ascites. Adrenals/Urinary Tract: Small left peripelvic renal cysts. Along the right kidney lower pole, a 3.7 by 1.9 by 2.0 cm (volume = 7.4 cm^3) collection with enhancing margins is noted and appears new compared to 07/30/2024, suspicious for a right perirenal abscess. Stomach/Bowel: Wall thickening in the stomach including in the vicinity of the presumed gastrojejunostomy. No dilated bowel. Vascular/Lymphatic: Notable central mesenteric adenopathy. Some of this may well be reactive. Index mesenteric node 2.1 cm in short axis on image 44 series 3, formerly 2.1 cm. Reproductive: Uterus absent. Other: Small but abnormal amount of free pelvic fluid in the cul-de-sac. Mild thickening along the anterior peritoneal reflection especially on the right side as on image 38 series 3, this may be inflammatory and is less likely to represent tumor deposition. Small amount of fluid in the right paracolic gutter and tracking down in the right retroperitoneum.  Mild mesenteric and omental edema. Musculoskeletal: Endplate sclerosis with nitrogen gas phenomenon endplate irregularity at L3-4, similar back through 07/04/2024, degenerative findings favored over discitis-osteomyelitis. Lumbar spondylosis and degenerative disc disease causing substantial impingement at the L3-4 and L4-5 levels. IMPRESSION: 1. Multiple abscesses in the upper abdomen, including a 62 cc abscess extending above the Whipple site nearly to the hiatus, and a separate 61 cc abscess tracking from the pancreatic tail down along the left anterior perirenal fascia. There is potentially a third small abscess along the anterior gastrohepatic ligament region. 2. 7.4 cc right lower pole perirenal abscess. 3. Small to moderate left pleural effusion with associated passive atelectasis. 4. Mild cardiomegaly. 5. Notable central mesenteric adenopathy, some of this may well be reactive. 6. Small but abnormal amount of free pelvic fluid in the cul-de-sac. 7. Mild thickening along the anterior peritoneal reflection especially on the right side, this may be inflammatory and is less likely to represent tumor deposition. 8. Lumbar spondylosis and degenerative disc disease causing substantial impingement at the L3-4 and L4-5 levels. 9. Endplate sclerosis with nitrogen gas phenomenon endplate irregularity at L3-4, similar back through 07/04/2024, degenerative findings favored over discitis-osteomyelitis. Electronically Signed   By: Ryan Salvage M.D.   On: 08/25/2024 18:54   DG Abd Portable 1V Result Date: 08/23/2024 CLINICAL DATA:  Surgical dehiscence EXAM: DG ABD PORTABLE 1V COMPARISON:  Abdominal radiograph dated 08/19/2024 FINDINGS: Similar position of left lower quadrant catheter. Interval removal of 1 of the right lateral approach catheters with remaining catheter extending to the left hemiabdomen. A few dilated loops of left upper quadrant bowel. No free air or pneumatosis. No abnormal radio-opaque calculi or  mass effect. No acute or substantial osseous abnormality. The sacrum and coccyx are partially obscured by overlying bowel contents. IMPRESSION: 1. Interval removal of 1 of the right lateral approach catheters with remaining catheter extending to the left hemiabdomen. Similar position of left lower quadrant catheter. 2. A few dilated loops of left upper quadrant bowel, likely ileus. Electronically Signed   By: Limin  Xu M.D.   On: 08/23/2024 11:23   DG Abd 1 View Result Date: 08/19/2024 CLINICAL DATA:  Epigastric abdominal discomfort.  Postop Whipple. EXAM: ABDOMEN - 1 VIEW COMPARISON:  Radiograph 08/15/2024 FINDINGS: Multiple drains project over the right and left abdomen. Scattered air within prominent small bowel, measuring up to 3.2 cm in the left abdomen. Minimal formed stool in the colon. Surgical clips in the upper abdomen. IMPRESSION: Scattered air within prominent small bowel, measuring up to 3.2 cm in the left abdomen, favor postoperative ileus. Multiple drains remain in place. Electronically Signed   By: Andrea Gasman M.D.   On: 08/19/2024 14:58   DG Abd 1 View Result Date: 08/15/2024 CLINICAL DATA:  10323 Abdominal distension 10323 EXAM: ABDOMEN - 1 VIEW COMPARISON:  07/27/2024, 07/30/2024 FINDINGS: Interval removal of the esophagogastric tube. Nonobstructive bowel gas pattern.Postsurgical drains in the epigastrium and left mid abdomen.No pneumoperitoneum. No organomegaly or radiopaque calculi. No acute fracture or destructive lesion. Multilevel degenerative disc disease of the spine. IMPRESSION: Nonobstructive bowel gas pattern. Electronically Signed   By: Rogelia Myers M.D.   On: 08/15/2024 21:14    Labs:  Basic Metabolic Panel:    Latest Ref Rng & Units 09/04/2024    4:34 AM 09/03/2024    3:32 PM 09/01/2024    6:27 AM  BMP  Glucose 70 - 99 mg/dL 760  877  797   BUN 8 - 23 mg/dL 19  22  22    Creatinine 0.44 - 1.00 mg/dL 8.81  8.79  8.67   Sodium 135 - 145 mmol/L 141  139  138    Potassium 3.5 - 5.1 mmol/L 4.1  4.4  4.0   Chloride 98 - 111 mmol/L 114  112  110   CO2 22 - 32 mmol/L 17  16  17    Calcium  8.9 - 10.3 mg/dL 8.2  8.3  8.1      CBC:    Latest Ref Rng & Units 09/04/2024    4:34 AM 09/03/2024    3:32 PM 09/01/2024    6:27 AM  CBC  WBC 4.0 - 10.5 K/uL 8.6  8.7  6.9   Hemoglobin 12.0 - 15.0 g/dL 8.5  9.1  9.5   Hematocrit 36.0 - 46.0 % 28.0  30.4  31.0   Platelets 150 - 400 K/uL 416  493  433      CBG: Recent Labs  Lab 09/03/24 1203 09/03/24 1714 09/03/24 2103  GLUCAP 164* 92 117*    Brief HPI:   Lauren Gray is a 73 y.o. female with history of prediabetes, intermittent nausea vomiting for months was admitted on 07/04/2024 for workup.  CT abdomen pelvis done revealing small bowel obstruction likely from underlying duodenal mass and small bowel enteroscopy with biopsy done which was positive for adenocarcinoma.  She was started on TPN and clear liquid diet but continued to have issues with nausea and vomiting therefore NG tube placed with improvement in symptoms.  Elevated blood pressures treated with IV meds and insulin  was added for management of hyperglycemia. She underwent Whipple procedure with placement of pancreatic duct stent and jejunostomy tube on 09/09 by Dr. Aron.    Postop course significant for issues with pain control, confusion due to narcotics, tachypnea with leukocytosis due to pancreatic leak and PNA,, pleural effusion requiring tap for 450 cc, acute on chronic renal failure as well as ongoing issues with anxiety, nausea vomiting as well as abdominal discomfort/pain.  PT/OT was consulted and patient was noted to be limited by dizziness, weakness, fatigue and was requiring min to contact-guard assist with mobility and min assist  with ADLs.  She was independent and working prior to admission.  CIR was recommended due to functional decline.   Hospital Course: Lauren Gray was admitted to rehab 08/15/2024 for inpatient therapies to  consist of PT and OT at least three hours five days a week. Past admission physiatrist, therapy team and rehab RN have worked together to provide customized collaborative inpatient rehab. Her intake remains variable with reports of bloating and diarrhea. KUB done at admission was negative for ileus. Tube feeds were continued per Dr. Dia input for 14 hours with recommendations to decrease to wean after discharge. RD expressed concerns regarding absorption of nutrition, worked to obtain Relizorb for use at nights and has educated patient/family on use.    Due to history of pre-diabetes, recent pancreatic surgery and need for insulin , her blood sugars were monitored with ac/hs CBG checks and SSI was use prn for tighter BS control.  Her am blood sugars were noted to be elevated due to tube feeds but have improved with addition of Relizorb. Her surgical drain became dislodge and CT A/p repeated on 10/13 showing multiple intraabdominal abscesses and IR drain replaced on 10/14. Second surgical drain was dislodged on 10/15 with ongoing purulent  drainage from prior drain site and watchful waiting recommended by surgery.  She was started on IV zosyn  as cultures done positive for  Klebsiella and pseudomonas this was changed to merrem per pharmacy input.  Follow up CBC showed white count to be stable and ABLA ongoing. Check of lytes showed improvement in SCr.   CT A/P repeated 10/17 showing fluid collection near distal pancreas and IR consulted but no safe window noted for percutaneous drain placed his was amenable for drainage.  She was set to follow up in IR drain clinic for repeat imaging and possible drain injection.  She was transitioned to Levaquin on 10/21 by Dr. Aron with recommendations to continue this for 10 days past discharge.  LOS was extended to monitor for side effects and she has been afebrile and was tolerating this without SE.  Her blood sugars were noted to be trending down therefore meal  coverage d/c and basal insulin  decreased to 15 units to prevent hypoglycemic SE  while on Levaquin.  She was advised to monitor BS on BID to QID basis and follow up with PCP for further adjustment of insulin .  She has made gains during her stay and has progressed to supervision level. She will continue to receive follow up HHPT, HHOT and HHRN by [facility] after discharge.    Rehab course: During patient's stay in rehab weekly team conferences were held to monitor patient's progress, set goals and discuss barriers to discharge. At admission, patient required min to mod assist with ADL tasks and min assist with mobility. She  has had improvement in activity tolerance, balance, postural control as well as ability to compensate for deficits. She is able to complete ADL tasks with set up assist to supervision. She requires supervision for transfers and to ambulate 56' with use of RW. She is able to climb 4 stairs with supervision.    Discharge disposition: 06-Home-Health Care Svc  Diet: Carb Modified  Special Instructions: Continue daily dressing change to prior drain site daily Flush IR drain daily with 5 cc sterile saline and document output 3.  Monitor BS bid to qid basis and follow up with PCP for adjustment of insulin .     Allergies as of 09/04/2024       Reactions  Codeine Anxiety   i don't feel right at all   Latex Rash        Medication List     STOP taking these medications    feeding supplement (OSMOLITE 1.5 CAL) Liqd Replaced by: feeding supplement (KATE FARMS STANDARD ENT 1.4) Liqd liquid   feeding supplement (PROSource TF20) liquid   gabapentin  250 MG/5ML solution Commonly known as: NEURONTIN    HYDROcodone-acetaminophen  5-325 MG tablet Commonly known as: NORCO/VICODIN   insulin  aspart 100 UNIT/ML injection Commonly known as: novoLOG    insulin  glargine 100 UNIT/ML injection Commonly known as: LANTUS  Replaced by: Lantus  SoloStar 100 UNIT/ML Solostar  Pen   methocarbamol  1000 MG/10ML injection Commonly known as: ROBAXIN  Replaced by: methocarbamol  500 MG tablet   morphine  (PF) 2 MG/ML injection   ondansetron  4 MG/2ML Soln injection Commonly known as: ZOFRAN    Pancrelipase  (Lip-Prot-Amyl) 24000-76000 units Cpep Replaced by: Creon  12000-38000 units Cpep capsule   sodium chloride  0.9 % SOLN 50 mL with promethazine  25 MG/ML SOLN 12.5 mg   traMADol  50 MG tablet Commonly known as: ULTRAM        TAKE these medications    Accu-Chek Guide Test test strip Generic drug: glucose blood Use up to four times daily as directed.   Blood Glucose Test Strips 333 Strp 1 strip by In Vitro route 4 (four) times daily -  before meals and at bedtime.   Accu-Chek Guide w/Device Kit Use up to four times daily as directed   acetaminophen  325 MG tablet Commonly known as: TYLENOL  Take 1-2 tablets (325-650 mg total) by mouth every 4 (four) hours as needed for mild pain (pain score 1-3).   amLODipine  10 MG tablet Commonly known as: NORVASC  Take 1 tablet (10 mg total) by mouth daily.   carvedilol 6.25 MG tablet Commonly known as: COREG Take 1 tablet (6.25 mg total) by mouth 2 (two) times daily with a meal. What changed: how much to take   cloNIDine  0.1 MG tablet Commonly known as: CATAPRES  Take 1 tablet (0.1 mg total) by mouth 2 (two) times daily.   Creon  12000-38000 units Cpep capsule Generic drug: lipase/protease/amylase Take 2 capsules (24,000 Units total) by mouth 3 (three) times daily before meals. Replaces: Pancrelipase  (Lip-Prot-Amyl) 24000-76000 units Cpep   Embecta Pen Needle Nano 32G X 4 MM Misc Generic drug: Insulin  Pen Needle 1 application  by Does not apply route 2 (two) times daily.   feeding supplement (KATE FARMS STANDARD ENT 1.4) Liqd liquid 100 cc daily at bedtime for 10 hours--8 pm to 6 pm (can adjust time for convenience) Replaces: feeding supplement (OSMOLITE 1.5 CAL) Liqd   fluticasone  50 MCG/ACT nasal  spray Commonly known as: FLONASE  Place 2 sprays into both nostrils daily.   free water  Soln Place 150 mLs into feeding tube every 8 (eight) hours. Use filtered water . What changed:  how much to take additional instructions   Lancet Device Misc 1 each by Does not apply route as directed. Dispense based on patient and insurance preference. Use up to four times daily as directed. (FOR ICD-10 E10.9, E11.9).   Lancets Misc 1 each by Does not apply route as directed. Dispense based on patient and insurance preference. Use up to four times daily as directed. (FOR ICD-10 E10.9, E11.9).   Accu-Chek Softclix Lancets lancets Monitor blood sugars before meals and at bedtime. .   Lantus  SoloStar 100 UNIT/ML Solostar Pen Generic drug: insulin  glargine Inject 15 Units into the skin daily. Replaces: insulin  glargine 100 UNIT/ML injection Notes to patient:  Taken at bedtime   levofloxacin 750 MG tablet Commonly known as: LEVAQUIN Take 1 tablet (750 mg total) by mouth daily.   loperamide  2 MG capsule Commonly known as: IMODIUM  Take 1 capsule (2 mg total) by mouth 4 (four) times daily as needed for diarrhea or loose stools.   megestrol 40 MG/ML suspension Commonly known as: MEGACE Take 10 mLs (400 mg total) by mouth daily.   methocarbamol  500 MG tablet Commonly known as: ROBAXIN  Take 1 tablet (500 mg total) by mouth every 8 (eight) hours. Replaces: methocarbamol  1000 MG/10ML injection   Normal Saline Flush 0.9 % Soln Please flush drain once per day with 5 mL.   pantoprazole  40 MG tablet Commonly known as: PROTONIX  Take 1 tablet (40 mg total) by mouth 2 (two) times daily. What changed: when to take this   prochlorperazine  5 MG tablet Commonly known as: COMPAZINE  Take 1-2 tablets (5-10 mg total) by mouth every 6 (six) hours as needed for nausea.   Relizorb Devi device 1 Cartridge by Does not apply route 3 (three) times daily. AS instructed.   traZODone  50 MG tablet Commonly known  as: DESYREL  Place 1 tablet (50 mg total) into feeding tube at bedtime.        Follow-up Information     Paseda, Folashade R, FNP Follow up.   Specialty: Nurse Practitioner Why: Call in 1-2 days for post hospital follow up Contact information: [address] [place], [place] [address] [phone]         Carilyn Prentice BRAVO, MD. Call.   Specialty: Physical Medicine and Rehabilitation Why: As needed Contact information: [address] [place] [address] [phone]         Aron Shoulders, MD Follow up on 09/15/2024.   Specialty: General Surgery Why: 11:45 AM, please arrive 11:15-11:30 Contact information: [address] [place] [address] [phone]         Rosalie Kitchens, MD Follow up.   Specialty: Gastroenterology Contact information: [address] [place] [address] [phone]         Vanice Sharper, MD Follow up.   Specialties: Interventional Radiology, Radiology Why: office will call you with follow up appointment Contact information: [address] [place] [address] [phone]                 Signed: Sharlet GORMAN Schmitz 09/10/2024, 8:58 AM

## 2024-09-11 ENCOUNTER — Other Ambulatory Visit (HOSPITAL_COMMUNITY): Payer: Self-pay

## 2024-09-11 ENCOUNTER — Ambulatory Visit: Payer: Self-pay

## 2024-09-11 NOTE — Telephone Encounter (Signed)
 FYI Only or Action Required?: Action required by provider: request for appointment and clinical question for provider.  Patient was last seen in primary care on 06/18/2024 by Paseda, Folashade R, FNP.  Called Nurse Triage reporting Blood Sugar Problem.  Symptoms began yesterday.  Interventions attempted: Prescription medications: Lantus  .  Symptoms are: unchanged.  Triage Disposition: See Physician Within 24 Hours  Patient/caregiver understands and will follow disposition?:    Copied from CRM #[id number]. Topic: Clinical - Red Word Triage >> Sep 11, 2024 10:07 AM Victoria B wrote: [facility] that prompted transfer to Nurse Triage: Patient's daughter on the line, pt blood sugar shows high Reason for Disposition  New-onset diabetes suspected (e.g., increased thirst, frequent urination, weight loss)  Answer Assessment - Initial Assessment Questions No available appts today. Patient's daughter requesting video visit or later appt in person. Requesting Insulin  Sliding Scale, d/c from hospital only with Lantus .  Advised UC today and ED if symptoms worsen.  1. BLOOD GLUCOSE: What is your blood glucose level?      HIGH; no numerical; did not receive sliding scale, pt on tube feeding, Nutren 1.5  D/C hospital 09/04/24 Gave 15 units of Lantus  this morning and last night(not prescribed as such, pt's daughter gave due to high reading) 2. ONSET: When did you check the blood glucose?     yesterday 3. USUAL RANGE: What is your glucose level usually? (e.g., usual fasting morning value, usual evening value)     250-300  5. TYPE 1 or 2:  Do you know what type of diabetes you have?  (e.g., Type 1, Type 2, Gestational; doesn't know)      Type II 6. INSULIN : Do you take insulin ? What type of insulin (s) do you use? What is the mode of delivery? (syringe, pen; injection or pump)?      Lantus  15 units at bedtime 7. DIABETES PILLS: Do you take any pills for your diabetes? If Yes, ask: Have  you missed taking any pills recently?     no 8. OTHER SYMPTOMS: Do you have any symptoms? (e.g., fever, frequent urination, difficulty breathing, dizziness, weakness, vomiting)     Denies fever, chills, n/v, diff breathing Normal behavior, alert and orient Hx of whipple surgery; 1/2 pancreas; 07/22/24  Protocols used: Diabetes - High Blood Sugar-A-AH

## 2024-09-11 NOTE — Telephone Encounter (Signed)
Pt has appt tomorrow. [place]

## 2024-09-11 NOTE — Telephone Encounter (Signed)
 Called [facility], waited on HOLD no response

## 2024-09-12 ENCOUNTER — Telehealth (INDEPENDENT_AMBULATORY_CARE_PROVIDER_SITE_OTHER): Payer: Self-pay | Admitting: Nurse Practitioner

## 2024-09-12 ENCOUNTER — Encounter: Payer: Self-pay | Admitting: Nurse Practitioner

## 2024-09-12 ENCOUNTER — Telehealth: Payer: Self-pay | Admitting: Nurse Practitioner

## 2024-09-12 VITALS — Ht 69.0 in | Wt 246.0 lb

## 2024-09-12 DIAGNOSIS — C170 Malignant neoplasm of duodenum: Secondary | ICD-10-CM

## 2024-09-12 DIAGNOSIS — N182 Chronic kidney disease, stage 2 (mild): Secondary | ICD-10-CM

## 2024-09-12 DIAGNOSIS — I10 Essential (primary) hypertension: Secondary | ICD-10-CM

## 2024-09-12 DIAGNOSIS — Z09 Encounter for follow-up examination after completed treatment for conditions other than malignant neoplasm: Secondary | ICD-10-CM

## 2024-09-12 DIAGNOSIS — R5381 Other malaise: Secondary | ICD-10-CM | POA: Diagnosis not present

## 2024-09-12 DIAGNOSIS — R197 Diarrhea, unspecified: Secondary | ICD-10-CM

## 2024-09-12 DIAGNOSIS — D62 Acute posthemorrhagic anemia: Secondary | ICD-10-CM

## 2024-09-12 DIAGNOSIS — R748 Abnormal levels of other serum enzymes: Secondary | ICD-10-CM

## 2024-09-12 DIAGNOSIS — Z934 Other artificial openings of gastrointestinal tract status: Secondary | ICD-10-CM

## 2024-09-12 DIAGNOSIS — K651 Peritoneal abscess: Secondary | ICD-10-CM

## 2024-09-12 DIAGNOSIS — T8143XA Infection following a procedure, organ and space surgical site, initial encounter: Secondary | ICD-10-CM

## 2024-09-12 DIAGNOSIS — R739 Hyperglycemia, unspecified: Secondary | ICD-10-CM

## 2024-09-12 MED ORDER — INSULIN GLARGINE 100 UNIT/ML SOLOSTAR PEN: 19.0000 [IU] | PEN_INJECTOR | Freq: Every day | SUBCUTANEOUS | 2 refills | Status: DC

## 2024-09-12 NOTE — Assessment & Plan Note (Addendum)
 Complications of enteral nutrition (diarrhea, vomiting, hyperglycemia) Increased diarrhea and occasional vomiting with Nutren 1.5. Concern for hyperglycemia with blood sugar levels spiking in the high 400s, potentially related to enteral nutrition. - Contacted Pam at [facility] to discuss changing the enteral nutrition formula. - Document intolerance to current formula in Epic and send a message to the nutrition team. - Discuss enteral nutrition issues with general surgery during the upcoming appointment.  Minimal drainage from the surgical site. Home health assisting with dressing changes. - Continue home health for dressing changes. - Continue nightly flushing with 5 mL of water . - Keep appointment with general surgery on November 3rd at 11:45 AM. hyperglycemia Hyperglycemia potentially exacerbated by current enteral nutrition formula. Blood sugar levels significantly elevated in the morning. Increase Lantus  to 19 units daily

## 2024-09-12 NOTE — Telephone Encounter (Signed)
 Copied from CRM [id number]. Topic: Appointments - Scheduling Inquiry for Clinic >> Sep 11, 2024  2:14 PM Wess RAMAN wrote: Reason for CRM: Patient's daughter, Lauren Gray, would like a call back from Paseda, Folashade, FNP about her mother's care and switching the upcoming appt to virtual  Callback #: [phone]

## 2024-09-12 NOTE — Assessment & Plan Note (Signed)
 Increase Lantus  to 19 units daily

## 2024-09-12 NOTE — Assessment & Plan Note (Signed)
 Continue Creon  24,000 units 3 times daily before meals

## 2024-09-12 NOTE — Assessment & Plan Note (Addendum)
 Will be getting PT and OT and RN at home

## 2024-09-12 NOTE — Assessment & Plan Note (Addendum)
 Contacted Pam at [facility] to discuss changing the enteral nutrition formula. Continue Imodium  2 mg 4 times daily as needed,

## 2024-09-12 NOTE — Progress Notes (Signed)
 Virtual Visit via video  Note  I connected with Lauren Gray @ on 09/12/24 at  9:40 AM EDT by video and verified that I am speaking with the correct person using two identifiers.  I spent 15 minutes on this video encounter  Location: Patient: home Provider: office   I discussed the limitations, risks, security and privacy concerns of performing an evaluation and management service by telephone and the availability of in person appointments. I also discussed with the patient that there may be a patient responsible charge related to this service. The patient expressed understanding and agreed to proceed.   History of Present Illness: Discussed the use of AI scribe software for clinical note transcription with the patient, who gave verbal consent to proceed.  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 She presents with issues related to her tube feeding and post-surgical recovery. She is accompanied by her daughter, Cassius, who is her primary caregiver.   See discharge summary below.  HPI:  Lauren Gray is a 73 year old female with history of obesity, pre-diabetes, intermittent N/V for a month and was admitted on 07/04/24 for work up. She reported constipation as well as 10 lbs wt loss in recent months. She had been seen by Dr Rosalie in office a week PTA with plans for endoscopy. 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 She was started on TPN as well as clear liquids diet pending surgery. She continued to have issues with N/V and  NGT placed 08/29 with improvement in symptoms. Elevated BP treated with IV meds and insulin  added for hyperglycemia.    Sh underwent Whipple procedure with placement of pancreatic duct stent and jejunostomy tube on 09/09 by Dr. Aron. Post op course  significant for fluid overload with rise in LFTs TB, pain control requiring dilaudid  PCA, recurrent N/V 09/14 requiring replacement of NG as well as   issues with confusion due to narcotics. Broad spectrum antibiotics added due to tachypnea with leucocytosis 09/18 due to pancreatic leak and PNA. CT A/P/chest negative for PE, showed small to moderate bilateral pleural effusions and large amount of inflammatory stranding and fluid seen in region of pancreatic head/body on imaging. .    She pulled out NGT on 09/17 and started on clears. Left pleural effusion tapped for 250 cc on 09/20. Has had murky/pruluent drainage from right [place] drain.    Diarrhea managed with addition of fiber and pancreatic enzymes. ABLA treated with 5 units total PRBC. Has completed 7 day course of Vanc, Zosyn  and Diflucan  on 09/27. Issues with blood pressure felt to be related to pain and anxiety and amlodipine  added for BP control. Acute on chronic renal failure resolved. Has been transitioned to tube feeds. PT/OT has been working with patient who is showing improvement in activity tolerance but limited by fatigue and weakness. She has  has issues with dizziness, working on gaze stabilization techniques and requires min to CGA with mobility and min assist with ADLs.  She was independent PTA and CIR recommended due to functional decline.    She was she was discharged on on October 23rd, , she spent three weeks in rehabilitation before being discharged home.  Since discharge, she has been experiencing issues with her tube feeding formula, Nutren 1.5, which has led to increased diarrhea and occasional vomiting. She  has had blood sugar spikes in the morning, with readings in the high 400s or unrecordable levels, since the formula was changed from Osmolite, which she was on during her hospital stay, to Nutren 1.5.  She is currently on a regimen of tube feeding at night, while consuming small portions of food by mouth during the day.  Despite eating breakfast, lunch, and dinner within her dietary range, the tube feeding at night is necessary due to her limited oral intake. The current formula is not well tolerated, causing her to be 'up and down all night long' due to diarrhea, impacting her rest.   No fever or chills. The drain from her surgery is still in place, with minimal output, and she performs flushing every night with 5 mL of water . Home health assists with dressing changes.   Assessment & Plan   Observations/Objective: Patient alert and oriented no sign of distress noted  Assessment and Plan: Duodenal adenocarcinoma (HCC) Complications of enteral nutrition (diarrhea, vomiting, hyperglycemia) Increased diarrhea and occasional vomiting with Nutren 1.5. Concern for hyperglycemia with blood sugar levels spiking in the high 400s, potentially related to enteral nutrition. - Contacted Pam at [employer] to discuss changing the enteral nutrition formula. - Document intolerance to current formula in Epic and send a message to the nutrition team. - Discuss enteral nutrition issues with general surgery during the upcoming appointment.  Minimal drainage from the surgical site. Home health assisting with dressing changes. - Continue home health for dressing changes. - Continue nightly flushing with 5 mL of water . - Keep appointment with general surgery on November 3rd at 11:45 AM. hyperglycemia Hyperglycemia potentially exacerbated by current enteral nutrition formula. Blood sugar levels significantly elevated in the morning. Increase Lantus  to 19 units daily    Debility Will be getting PT and OT and RN at home  Hypertension Managed with amlodipine  10 mg daily, carvedilol 6.25 mg twice daily, and clonidine  0.1 mg twice daily. - Continue current antihypertensive medications: amlodipine , carvedilol, and clonidine .     09/12/2024    9:44 AM 09/04/2024   11:13 AM 09/04/2024    5:10 AM 09/03/2024    8:08 PM 09/03/2024     2:49 PM 09/03/2024    8:23 AM 09/03/2024    5:00 AM  BP/Weight  Systolic BP  134 862 140 140 130 132  Diastolic BP  57 56 54 66 62 63  Wt. (Lbs) 246      246.92  BMI 36.33 kg/m2      37.54 kg/m2       Hyperglycemia Increase Lantus  to 19 units daily  Diarrhea Contacted Pam at [facility] to discuss changing the enteral nutrition formula. Continue Imodium  2 mg 4 times daily as needed,   Elevated lipase Continue Creon  24,000 units 3 times daily before meals  Postoperative intra-abdominal abscess (HCC)--cultures positive for Klebsiella and Pseudomonas Continue Levaquin 750 mg daily as ordered Follow-up with general surgery as planned  Jejunostomy present (HCC) Continue tube feeding, flushing and dressing changes as ordered  Hospital discharge follow-up Hospital chart reviewed, including discharge summary Medications reconciled and reviewed with the patient in detail    Follow Up Instructions:    I discussed the assessment and treatment plan with the patient. The patient was provided an opportunity to ask questions and all were answered. The patient agreed with the plan and demonstrated an understanding of the instructions.   The patient was advised to call back or seek an in-person evaluation if the symptoms worsen or if the  condition fails to improve as anticipated.

## 2024-09-12 NOTE — Assessment & Plan Note (Signed)
 Continue Levaquin 750 mg daily as ordered Follow-up with general surgery as planned

## 2024-09-12 NOTE — Assessment & Plan Note (Signed)
 Managed with amlodipine  10 mg daily, carvedilol 6.25 mg twice daily, and clonidine  0.1 mg twice daily. - Continue current antihypertensive medications: amlodipine , carvedilol, and clonidine .     09/12/2024    9:44 AM 09/04/2024   11:13 AM 09/04/2024    5:10 AM 09/03/2024    8:08 PM 09/03/2024    2:49 PM 09/03/2024    8:23 AM 09/03/2024    5:00 AM  BP/Weight  Systolic BP  134 862 140 140 130 132  Diastolic BP  57 56 54 66 62 63  Wt. (Lbs) 246      246.92  BMI 36.33 kg/m2      37.54 kg/m2

## 2024-09-12 NOTE — Assessment & Plan Note (Signed)
 Hospital chart reviewed, including discharge summary Medications reconciled and reviewed with the patient in detail

## 2024-09-12 NOTE — Assessment & Plan Note (Signed)
 Continue tube feeding, flushing and dressing changes as ordered

## 2024-09-15 ENCOUNTER — Other Ambulatory Visit: Payer: Self-pay

## 2024-09-15 DIAGNOSIS — D62 Acute posthemorrhagic anemia: Secondary | ICD-10-CM

## 2024-09-15 DIAGNOSIS — N182 Chronic kidney disease, stage 2 (mild): Secondary | ICD-10-CM

## 2024-09-15 NOTE — Progress Notes (Signed)
PROVIDER:  JINA CLAIR NEPHEW, MD Patient Care Team: Paseda, Folashade R as PCP - General  MRN: [id number] DOB: 03/04/1951 DATE OF ENCOUNTER: 09/15/2024 Initial History:  Patient presented to [facility] on August 22 with gastric outlet obstruction.  She was found to have an obstructing mass in the duodenum.  She was not able to get out of the hospital because she could not even tolerate her own secretions.  She kept her NG tube in and was on TPN while awaiting surgery. Interval History:   History of Present Illness Lauren Gray is a 73 year old female who presents in follow-up postop after undergoing Whipple and J-tube on 07/22/2024.  She had complications of pancreatic leak.  She did go to rehab after hospital discharge.  Unfortunately, her surgical drains fell out and she ended up getting a percutaneous drain.  This was originally not draining very much, but since she has been moving around more, the drainage has come up in volume.    She is accompanied by her daughter, who is her primary caregiver.  She is experiencing significant issues with her current brand of tube feeding, which is causing diarrhea throughout the night. She takes Imodium , usually two tablets once a day, which helps clear up the diarrhea by midday. However, the diarrhea recurs each night after starting the tube feeds around 6 or 7 PM. Her daughter has been in contact with the nutrition provider to change the tube feed formula, but the process is ongoing.  She is also experiencing extremely high blood sugar levels, particularly in the mornings, with readings over 400 mg/dL. Her daughter noted that when she did not hook her up to the tube feed, her blood sugar was lower, around the low 300s, and it leveled out over the day.  She is eating regular food, but her intake is limited. She struggles with the texture of certain foods like chicken, which she finds difficult to swallow despite liking the taste. She enjoys chicken  salad and quiche, which are easier for her to consume. Her daughter has been trying to incorporate protein into her diet, especially at breakfast, but she has difficulty with options like eggs and yogurt. She prefers quiche and has been treated to fruits like blueberries by her daughter.  She reports vision problems, describing her vision as foggy with glasses on and experiencing visual disturbances at night. These issues have arisen recently and may be related to her high blood sugar levels. She also reports normal urination and bowel movements aside from the diarrhea at night.  She has a pancreatic drain that she empties once a day, with output ranging from 50 to 75 mL. The drainage has increased slightly but is still within a manageable range. She also has some swelling, which her daughter manages with room temperature water  and elevation of her legs. She is currently taking Creon  and manages her medications carefully to avoid clogging her feeding tube.   Pathology 07/22/24 A. PORTAL LYMPH NODE, EXCISION:  One benign lymph node, negative for carcinoma (0/1)   B. DUODENUM AND GALLBLADDER, DUODENAL MASS, WHIPPLE PROCEDURE AND  CHOLECYSTECTOMY:  Moderate to poorly differentiated adenocarcinoma, mixed  pancreaticobiliary- and intestinal- type  Tumor arises within an intra-ampullary papillary tubular neoplasm  Tumor measures 3.2 x 3.0 x 2.2 cm  Tumor invades periampullary/periduodenal soft tissue, duodenum and  pancreas (> 5mm) (pT3b)  Margins free of tumor  Tumor focally extends to anterior surface  Perineural invasion present  Angiolymphatic invasion not identified  One of eleven periduodenal/peripancreatic lymph nodes with metastatic  carcinoma (1/11, pN1)  Peritumoral chronic pancreatitis and peptic duodenitis  Minimal chronic cholecystitis   Physical Examination:   Gen:  looks weak, but slow improvement. Well groomed Abd;  soft, non distended.  Right percutaneous drain with pancreatic  fluid that is clearish white and very murky.  J-tube is in place but only 1 sutures remaining.  3 additional sutures were placed and secured with 2-0 nylon's Extremities: +2 pitting edema   Assessment and Plan:     Diagnoses and all orders for this visit:  Cancer of ampulla of Vater (CMS/HHS-HCC)  Malnutrition of moderate degree (HHS-HCC)  Jejunostomy present (CMS/HHS-HCC)  Postoperative intra-abdominal abscess (CMS/HHS-HCC)   Assessment & Plan Malignant neoplasm of ampulla of Vater, post-surgical management with pancreatic drain Tumor originates from ampulla, better prognosis than duodenal or pancreatic tumors. Pancreatic drain in place to prevent infection and manage fluid. Drain output increased slightly but acceptable. Not a chemotherapy candidate currently. - Restitch drain to prevent dislodgement. - Continue site care, monitor for infection. - Follow up with radiology on Wednesday. - Schedule oncology follow-up in three weeks. - Discuss chemotherapy options with oncology post-recovery.  Diarrhea secondary to tube feeds Diarrhea likely due to current tube feed brand, primarily at night, affecting rest. Imodium  used for symptom management. -Daughter working with home health company to change tube feed formula - Administer Imodium  with tube feeds and before bed. - Monitor blood sugar levels.  Protein-calorie malnutrition Protein-calorie malnutrition due to inadequate intake and tube feed issues. Weight loss noted, insufficient protein and calorie consumption. - Encourage high-protein foods: quiche, chicken salad, yogurt. - Consider protein supplements if intake insufficient. - Monitor weight and nutritional status.  Vision changes possibly related to diabetes Vision changes, including foggy vision and night disturbances, possibly related to diabetes and high blood sugar. - Schedule ophthalmologist appointment. - Contact primary care for blood sugar management discussion,  possibly expedite appointment.     Return in about 2 weeks (around 10/07/2024).   The plan was discussed in detail with the patient today, who expressed understanding.  The patient has my contact information, and understands to call me with any additional questions or concerns in the interval.  I would be happy to see the patient back sooner if the need arises.   JINA CLAIR NEPHEW, MD

## 2024-09-16 ENCOUNTER — Ambulatory Visit: Payer: Self-pay | Admitting: Nurse Practitioner

## 2024-09-16 LAB — BASIC METABOLIC PANEL WITH GFR
BUN/Creatinine Ratio: 21 (ref 12–28)
BUN: 29 mg/dL — ABNORMAL HIGH (ref 8–27)
CO2: 20 mmol/L (ref 20–29)
Calcium: 8.7 mg/dL (ref 8.7–10.3)
Chloride: 116 mmol/L (ref 96–106)
Creatinine, Ser: 1.39 mg/dL — ABNORMAL HIGH (ref 0.57–1.00)
Glucose: 154 mg/dL — ABNORMAL HIGH (ref 70–99)
Potassium: 3.8 mmol/L (ref 3.5–5.2)
Sodium: 152 mmol/L — ABNORMAL HIGH (ref 134–144)
eGFR: 40 mL/min/1.73 — ABNORMAL LOW (ref >59)

## 2024-09-16 LAB — CBC
Hematocrit: 38.5 % (ref 34.0–46.6)
Hemoglobin: 12 g/dL (ref 11.1–15.9)
MCH: 27.8 pg (ref 26.6–33.0)
MCHC: 31.2 g/dL — ABNORMAL LOW (ref 31.5–35.7)
MCV: 89 fL (ref 79–97)
Platelets: 138 x10E3/uL — ABNORMAL LOW (ref 150–450)
RBC: 4.32 x10E6/uL (ref 3.77–5.28)
RDW: 16.9 % — ABNORMAL HIGH (ref 11.7–15.4)
WBC: 9.9 x10E3/uL (ref 3.4–10.8)

## 2024-09-16 NOTE — Progress Notes (Signed)
 Referring Physician(s): Dr. Aron   Chief Complaint: The patient is seen in follow up today s/p RUQ abscess drain placement 08/26/24.   History of present illness:  Lauren Gray is a 73 y.o. female who presented to the hospital on 8/22 with concerns for abdominal pain, vomiting, and weight loss for the past several weeks. 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 She recovered well and was discharged to rehab on 10/3.   Unfortunately one of her surgical drains became dislodged and she was also experiencing worsening nausea and abdominal pain. Repeat imaging 08/25/24 revealed multiple intra-abdominal abscesses and IR was consulted for drain placement. On 08/26/24 she was seen in IR and received a 12 Fr RUQ abscess drain.   Several days later her second surgical drain was inadvertently removed. IR was consulted again for drain placement however a repeat CT scan showed no safe window for percutaneous placement. She was treated with IV antibiotics and was discharged from rehab 09/04/24.    She presents to the IR outpatient clinic for a drain evaluation. She followed up with Dr. Aron 09/15/24 and reported a daily drain output of 50-75 ml.   Past Medical History:  Diagnosis Date   Complication of anesthesia    Obesity    Scoliosis     Past Surgical History:  Procedure Laterality Date   ABDOMINAL HYSTERECTOMY     TVH   BONE BIOPSY  07/06/2024   Procedure: BIOPSY, GI;  Surgeon: Elicia Claw, MD;  Location: [facility];  Service: Gastroenterology;;   ENTEROSCOPY N/A 07/06/2024   Procedure: ENTEROSCOPY;  Surgeon: Elicia Claw, MD;  Location: [facility];  Service: Gastroenterology;  Laterality: N/A;   JEJUNOSTOMY N/A 07/22/2024   Procedure: CREATION, JEJUNOSTOMY;  Surgeon:  Aron Shoulders, MD;  Location: [facility];  Service: General;  Laterality: N/A;   PELVIC LAPAROSCOPY  04/06/2006   BSO   WHIPPLE PROCEDURE N/A 07/22/2024   Procedure: WHIPPLE PROCEDURE;  Surgeon: Aron Shoulders, MD;  Location: [facility] OR;  Service: General;  Laterality: N/A;    Allergies: Codeine and Latex  Medications: Prior to Admission medications   Medication Sig Start Date End Date Taking? Authorizing Provider  Accu-Chek Softclix Lancets lancets Monitor blood sugars before meals and at bedtime. . 09/03/24   Love, Sharlet RAMAN, PA-C  acetaminophen  (TYLENOL ) 325 MG tablet Take 1-2 tablets (325-650 mg total) by mouth every 4 (four) hours as needed for mild pain (pain score 1-3). 09/03/24   Love, Sharlet RAMAN, PA-C  amLODipine  (NORVASC ) 10 MG tablet Take 1 tablet (10 mg total) by mouth daily. 09/03/24   Love, Sharlet RAMAN, PA-C  Blood Glucose Monitoring Suppl (ACCU-CHEK GUIDE) w/Device KIT Use up to four times daily as directed 09/03/24   Love, Sharlet RAMAN, PA-C  carvedilol (COREG) 6.25 MG tablet Take 1 tablet (6.25 mg total) by mouth 2 (two) times daily with a meal. 09/03/24   Love, Sharlet RAMAN, PA-C  cloNIDine  (CATAPRES ) 0.1 MG tablet Take 1 tablet (0.1 mg total) by mouth 2 (two) times daily. 09/03/24   Love, Sharlet RAMAN, PA-C  Digestive Enzyme Cartridge (RELIZORB) DEVI device 1 Cartridge by Does not apply route 3 (three) times daily. AS instructed. 09/03/24   Love, Sharlet RAMAN, PA-C  fiber supplement, BANATROL TF, liquid Place 60 mLs into feeding tube 2 (two) times daily. 09/08/24  Paseda, Folashade R, FNP  fluticasone  (FLONASE ) 50 MCG/ACT nasal spray Place 2 sprays into both nostrils daily. 09/04/24   Love, Sharlet RAMAN, PA-C  gabapentin  (NEURONTIN ) 250 MG/5ML solution Place 2 mLs (100 mg total) into feeding tube every 8 (eight) hours. 09/09/24   Paseda, Folashade R, FNP  Glucose Blood (BLOOD GLUCOSE TEST STRIPS 333) STRP 1 strip by In Vitro route 4 (four) times daily -  before meals and at bedtime. 09/03/24   Love, Sharlet RAMAN,  PA-C  Glucose Blood (BLOOD GLUCOSE TEST STRIPS) STRP Use up to four times daily as directed. 09/03/24   Love, Sharlet RAMAN, PA-C  insulin  glargine (LANTUS ) 100 UNIT/ML Solostar Pen Inject 19 Units into the skin daily. 09/12/24   Paseda, Folashade R, FNP  Insulin  Pen Needle 32G X 4 MM MISC 1 application  by Does not apply route 2 (two) times daily. 09/03/24   Maurice Sharlet RAMAN, PA-C  Lancet Device MISC 1 each by Does not apply route as directed. Dispense based on patient and insurance preference. Use up to four times daily as directed. (FOR ICD-10 E10.9, E11.9). 09/03/24   Maurice Sharlet RAMAN, PA-C  Lancets MISC 1 each by Does not apply route as directed. Dispense based on patient and insurance preference. Use up to four times daily as directed. (FOR ICD-10 E10.9, E11.9). 09/03/24   Love, Sharlet RAMAN, PA-C  levofloxacin (LEVAQUIN) 750 MG tablet Take 1 tablet (750 mg total) by mouth daily. 09/04/24   Love, Sharlet RAMAN, PA-C  lipase/protease/amylase (CREON ) 12000-38000 units CPEP capsule Take 2 capsules (24,000 Units total) by mouth 3 (three) times daily before meals. 09/03/24   Love, Sharlet RAMAN, PA-C  loperamide  (IMODIUM ) 2 MG capsule Take 1 capsule (2 mg total) by mouth 4 (four) times daily as needed for diarrhea or loose stools. 09/03/24   Love, Sharlet RAMAN, PA-C  megestrol (MEGACE) 400 MG/10ML suspension Take 10 mLs (400 mg total) by mouth daily. 09/04/24   Love, Sharlet RAMAN, PA-C  methocarbamol  (ROBAXIN ) 500 MG tablet Take 1 tablet (500 mg total) by mouth every 8 (eight) hours. 09/03/24   Love, Sharlet RAMAN, PA-C  Nutritional Supplements (FEEDING SUPPLEMENT, KATE FARMS STANDARD ENT 1.4,) LIQD liquid 100 cc daily at bedtime for 10 hours--8 pm to 6 pm (can adjust time for convenience) 09/03/24   Love, Sharlet RAMAN, PA-C  pantoprazole  (PROTONIX ) 40 MG tablet Take 1 tablet (40 mg total) by mouth 2 (two) times daily. 09/03/24   Love, Sharlet RAMAN, PA-C  prochlorperazine  (COMPAZINE ) 5 MG tablet Take 1-2 tablets (5-10 mg total) by mouth every 6  (six) hours as needed for nausea. 09/03/24   Love, Sharlet RAMAN, PA-C  sodium chloride  flush (NS) 0.9 % SOLN Please flush drain once per day with 5 mL. 09/03/24 11/02/24  Love, Sharlet RAMAN, PA-C  traZODone  (DESYREL ) 50 MG tablet Place 1 tablet (50 mg total) into feeding tube at bedtime. 09/03/24   Love, Sharlet RAMAN, PA-C  Water  For Irrigation, Sterile (FREE WATER ) SOLN Place 150 mLs into feeding tube every 8 (eight) hours. Use filtered water . 09/03/24   Love, Sharlet RAMAN, PA-C     Family History  Problem Relation Age of Onset   Cancer Mother        ENDOMETRIOD OF OVARY   Diabetes Father    Heart disease Father    Breast cancer Sister 40   Aneurysm Sister    Diabetes Paternal Grandmother     Social History   Socioeconomic History   Marital status: Married  Spouse name: Not on file   Number of children: 1   Years of education: Not on file   Highest education level: Not on file  Occupational History   Not on file  Tobacco Use   Smoking status: Never   Smokeless tobacco: Never  Vaping Use   Vaping status: Never Used  Substance and Sexual Activity   Alcohol use: Not Currently   Drug use: No   Sexual activity: Yes    Partners: Male    Birth control/protection: Surgical    Comment: HYSTERECTOMY  Other Topics Concern   Not on file  Social History Narrative   Lives with her husband    Social Drivers of Corporate Investment Banker Strain: Not on file  Food Insecurity: No Food Insecurity (07/20/2024)   Hunger Vital Sign    Worried About Running Out of Food in the Last Year: Never true    Ran Out of Food in the Last Year: Never true  Transportation Needs: No Transportation Needs (07/20/2024)   PRAPARE - Administrator, Civil Service (Medical): No    Lack of Transportation (Non-Medical): No  Physical Activity: Not on file  Stress: Not on file  Social Connections: Socially Integrated (07/20/2024)   Social Connection and Isolation Panel    Frequency of Communication with Friends  and Family: Three times a week    Frequency of Social Gatherings with Friends and Family: Twice a week    Attends Religious Services: 1 to 4 times per year    Active Member of [employer] or Organizations: Yes    Attends Banker Meetings: 1 to 4 times per year    Marital Status: Married     Vital Signs: There were no vitals taken for this visit.  Physical Exam  Imaging: No results found.  Labs:  CBC: Recent Labs    09/01/24 0627 09/03/24 1532 09/04/24 0434 09/15/24 1023  WBC 6.9 8.7 8.6 9.9  HGB 9.5* 9.1* 8.5* 12.0  HCT 31.0* 30.4* 28.0* 38.5  PLT 433* 493* 416* 138*    COAGS: Recent Labs    07/22/24 0350 07/23/24 0406 07/31/24 1137 08/26/24 1918  INR 1.0 1.2 1.2 1.2    BMP: Recent Labs    08/30/24 0512 09/01/24 0627 09/03/24 1532 09/04/24 0434 09/15/24 1023  NA 136 138 139 141 152*  K 4.1 4.0 4.4 4.1 3.8  CL 108 110 112* 114* 116*  CO2 17* 17* 16* 17* 20  GLUCOSE 283* 202* 122* 239* 154*  BUN 19 22 22 19  29*  CALCIUM  7.9* 8.1* 8.3* 8.2* 8.7  CREATININE 1.26* 1.32* 1.20* 1.18* 1.39*  GFRNONAA 45* 43* 48* 49*  --     LIVER FUNCTION TESTS: Recent Labs    08/12/24 0052 08/14/24 0347 08/16/24 0338 08/29/24 1033  BILITOT 0.6 0.7 0.5 0.5  AST 13* 18 13* 13*  ALT 19 21 17 19   ALKPHOS 83 117 98 147*  PROT 6.3* 6.9 6.7 6.7  ALBUMIN  1.6* 1.7* 1.5* <1.5*    Assessment and Plan:  73 year old female with a history of pancreatic cancer who underwent Whipple procedure with placement of a pancreatic duct stent, a jejunostomy tube, and 2 surgical drains. Her recovery period was complicated by accidental removal of her surgical drains and she was seen in IR 08/26/24 for placement of a new RUQ drain.   Ester Sides, MD Pager: [phone]   I spent a total of 25 Minutes in face to face in clinical consultation, greater than  50% of which was counseling/coordinating care for post-op intra-abdominal fluid collection.

## 2024-09-17 ENCOUNTER — Other Ambulatory Visit: Payer: Self-pay | Admitting: General Surgery

## 2024-09-17 ENCOUNTER — Ambulatory Visit
Admission: RE | Admit: 2024-09-17 | Discharge: 2024-09-17 | Disposition: A | Source: Ambulatory Visit | Attending: General Surgery | Admitting: General Surgery

## 2024-09-17 ENCOUNTER — Telehealth: Payer: Self-pay | Admitting: Nurse Practitioner

## 2024-09-17 ENCOUNTER — Ambulatory Visit
Admission: RE | Admit: 2024-09-17 | Discharge: 2024-09-17 | Disposition: A | Discharge status: Home / Self Care | Source: Ambulatory Visit | Attending: Radiology | Admitting: Radiology

## 2024-09-17 DIAGNOSIS — K651 Peritoneal abscess: Secondary | ICD-10-CM

## 2024-09-17 HISTORY — PX: IR RADIOLOGIST EVAL & MGMT: IMG5224

## 2024-09-17 MED ORDER — IOHEXOL 300 MG/ML  SOLN
100.0000 mL | Freq: Once | INTRAMUSCULAR | Status: AC | PRN
  Administered 2024-09-17: 100 mL via INTRAVENOUS

## 2024-09-17 NOTE — Telephone Encounter (Unsigned)
 Copied from CRM [id number]. Topic: Clinical - Medical Advice >> Sep 17, 2024 10:35 AM Tiffini S wrote: Reason for CRM: Pam with [employer] [phone] called about a feeding tube- faxed a letter requesting a Statement of Medical Necessity yesterday and need a response as soon as possible- explained paperwork can take up to 3 to 5 business days.

## 2024-09-18 ENCOUNTER — Telehealth: Payer: Self-pay | Admitting: Nurse Practitioner

## 2024-09-18 NOTE — Telephone Encounter (Signed)
 Reason for CRM: Received call from  [employer] per Holley Herring ph: [phone] fax: [phone]  faxed orders on 09/16/2024 to change formula please sign to change formula.

## 2024-09-22 ENCOUNTER — Other Ambulatory Visit (HOSPITAL_COMMUNITY): Payer: Self-pay

## 2024-09-23 NOTE — Telephone Encounter (Signed)
 Patient's daughter requesting update on request for formula change. Daughter has not heard back from anyone about getting that shipped out. Daughter states orders were sent to provider on 09/16/24 and representative of company has reached out to office twice. Daughter is very frustrated, adviced of turnaround time for forms to be completed & daughter states she will hold office accountable if something happens to patient.   Requesting callback [phone]

## 2024-09-26 ENCOUNTER — Telehealth: Payer: Self-pay

## 2024-09-26 NOTE — Telephone Encounter (Signed)
 Copied from CRM [id number]. Topic: General - Other >> Sep 17, 2024  2:58 PM Zebedee SAUNDERS wrote: Reason for CRM: Received call from  [employer] per Holley Herring ph: [phone] fax: [phone]  faxed orders on 09/16/2024 to change formula please sign to change formula.  I think this has been done. Please advise [facility]

## 2024-09-29 ENCOUNTER — Other Ambulatory Visit: Payer: Self-pay | Admitting: General Surgery

## 2024-10-02 ENCOUNTER — Telehealth: Payer: Self-pay | Admitting: Nurse Practitioner

## 2024-10-02 NOTE — Telephone Encounter (Signed)
 Copied from CRM #[id number]. Topic: General - Deceased Patient >> 10-26-24  2:40 PM Rosaria BRAVO wrote: Name of caller: [facility]   Date of death: 10/24/2024

## 2024-10-03 ENCOUNTER — Other Ambulatory Visit

## 2024-10-03 ENCOUNTER — Inpatient Hospital Stay: Admission: RE | Admit: 2024-10-03 | Source: Ambulatory Visit

## 2024-10-07 ENCOUNTER — Other Ambulatory Visit (HOSPITAL_COMMUNITY): Payer: Self-pay

## 2024-10-13 ENCOUNTER — Ambulatory Visit: Payer: Self-pay | Admitting: Nurse Practitioner

## 2024-10-13 DEATH — deceased
